# Patient Record
Sex: Male | Born: 1940
Health system: Southern US, Community
[De-identification: ages and names within clinical notes are randomized; demographics above are authoritative.]

## PROBLEM LIST (undated history)

## (undated) DIAGNOSIS — F0280 Dementia in other diseases classified elsewhere without behavioral disturbance: Secondary | ICD-10-CM

## (undated) DIAGNOSIS — R350 Frequency of micturition: Secondary | ICD-10-CM

## (undated) DIAGNOSIS — N390 Urinary tract infection, site not specified: Secondary | ICD-10-CM

## (undated) DIAGNOSIS — E7800 Pure hypercholesterolemia, unspecified: Secondary | ICD-10-CM

## (undated) DIAGNOSIS — C679 Malignant neoplasm of bladder, unspecified: Secondary | ICD-10-CM

## (undated) DIAGNOSIS — R3915 Urgency of urination: Secondary | ICD-10-CM

## (undated) DIAGNOSIS — Z85828 Personal history of other malignant neoplasm of skin: Secondary | ICD-10-CM

## (undated) DIAGNOSIS — Z87442 Personal history of urinary calculi: Secondary | ICD-10-CM

## (undated) DIAGNOSIS — D649 Anemia, unspecified: Secondary | ICD-10-CM

## (undated) DIAGNOSIS — F0390 Unspecified dementia without behavioral disturbance: Secondary | ICD-10-CM

## (undated) DIAGNOSIS — J986 Disorders of diaphragm: Secondary | ICD-10-CM

## (undated) DIAGNOSIS — Z8719 Personal history of other diseases of the digestive system: Secondary | ICD-10-CM

## (undated) DIAGNOSIS — G309 Alzheimer's disease, unspecified: Secondary | ICD-10-CM

## (undated) DIAGNOSIS — H9190 Unspecified hearing loss, unspecified ear: Secondary | ICD-10-CM

## (undated) DIAGNOSIS — N400 Enlarged prostate without lower urinary tract symptoms: Secondary | ICD-10-CM

## (undated) DIAGNOSIS — G839 Paralytic syndrome, unspecified: Secondary | ICD-10-CM

## (undated) DIAGNOSIS — C801 Malignant (primary) neoplasm, unspecified: Secondary | ICD-10-CM

## (undated) DIAGNOSIS — I10 Essential (primary) hypertension: Secondary | ICD-10-CM

## (undated) DIAGNOSIS — R209 Unspecified disturbances of skin sensation: Secondary | ICD-10-CM

## (undated) DIAGNOSIS — Z923 Personal history of irradiation: Secondary | ICD-10-CM

## (undated) DIAGNOSIS — N12 Tubulo-interstitial nephritis, not specified as acute or chronic: Secondary | ICD-10-CM

## (undated) DIAGNOSIS — R918 Other nonspecific abnormal finding of lung field: Secondary | ICD-10-CM

## (undated) DIAGNOSIS — N200 Calculus of kidney: Secondary | ICD-10-CM

## (undated) DIAGNOSIS — Z9221 Personal history of antineoplastic chemotherapy: Secondary | ICD-10-CM

## (undated) HISTORY — PX: SUBDURAL HEMORRHAGE DRAINAGE: SHX845

## (undated) HISTORY — PX: UPPER GI ENDOSCOPY: SHX6162

## (undated) HISTORY — PX: EYE SURGERY: SHX253

## (undated) HISTORY — PX: OTHER SURGICAL HISTORY: SHX169

## (undated) HISTORY — PX: CATARACT EXTRACTION, BILATERAL: SHX1313

## (undated) HISTORY — PX: COLONOSCOPY: SHX174

---

## 2003-03-21 ENCOUNTER — Ambulatory Visit (HOSPITAL_COMMUNITY): Admission: RE | Admit: 2003-03-21 | Discharge: 2003-03-21 | Payer: Self-pay | Admitting: Endocrinology

## 2003-03-21 ENCOUNTER — Encounter: Payer: Self-pay | Admitting: Endocrinology

## 2003-06-16 DIAGNOSIS — Z8719 Personal history of other diseases of the digestive system: Secondary | ICD-10-CM

## 2003-06-16 HISTORY — DX: Personal history of other diseases of the digestive system: Z87.19

## 2003-06-16 HISTORY — PX: EXTRACORPOREAL SHOCK WAVE LITHOTRIPSY: SHX1557

## 2003-10-31 ENCOUNTER — Encounter: Admission: RE | Admit: 2003-10-31 | Discharge: 2003-10-31 | Payer: Self-pay | Admitting: General Surgery

## 2003-12-03 ENCOUNTER — Ambulatory Visit (HOSPITAL_COMMUNITY): Admission: RE | Admit: 2003-12-03 | Discharge: 2003-12-03 | Payer: Self-pay | Admitting: Endocrinology

## 2003-12-12 ENCOUNTER — Inpatient Hospital Stay (HOSPITAL_COMMUNITY): Admission: EM | Admit: 2003-12-12 | Discharge: 2003-12-16 | Payer: Self-pay | Admitting: Emergency Medicine

## 2003-12-19 ENCOUNTER — Emergency Department (HOSPITAL_COMMUNITY): Admission: EM | Admit: 2003-12-19 | Discharge: 2003-12-19 | Payer: Self-pay

## 2004-02-05 ENCOUNTER — Encounter
Admission: RE | Admit: 2004-02-05 | Discharge: 2004-02-05 | Payer: Self-pay | Admitting: Thoracic Surgery (Cardiothoracic Vascular Surgery)

## 2004-02-14 ENCOUNTER — Ambulatory Visit (HOSPITAL_COMMUNITY): Admission: RE | Admit: 2004-02-14 | Discharge: 2004-02-14 | Payer: Self-pay | Admitting: Urology

## 2004-02-19 ENCOUNTER — Ambulatory Visit (HOSPITAL_BASED_OUTPATIENT_CLINIC_OR_DEPARTMENT_OTHER): Admission: RE | Admit: 2004-02-19 | Discharge: 2004-02-19 | Payer: Self-pay | Admitting: Urology

## 2006-03-09 ENCOUNTER — Inpatient Hospital Stay (HOSPITAL_COMMUNITY): Admission: RE | Admit: 2006-03-09 | Discharge: 2006-03-15 | Payer: Self-pay | Admitting: Endocrinology

## 2006-03-24 ENCOUNTER — Encounter (INDEPENDENT_AMBULATORY_CARE_PROVIDER_SITE_OTHER): Payer: Self-pay | Admitting: *Deleted

## 2006-03-24 ENCOUNTER — Ambulatory Visit (HOSPITAL_COMMUNITY): Admission: RE | Admit: 2006-03-24 | Discharge: 2006-03-25 | Payer: Self-pay | Admitting: Urology

## 2006-03-29 ENCOUNTER — Ambulatory Visit (HOSPITAL_COMMUNITY): Admission: RE | Admit: 2006-03-29 | Discharge: 2006-03-29 | Payer: Self-pay | Admitting: *Deleted

## 2006-04-28 ENCOUNTER — Encounter (INDEPENDENT_AMBULATORY_CARE_PROVIDER_SITE_OTHER): Payer: Self-pay | Admitting: *Deleted

## 2006-04-28 ENCOUNTER — Inpatient Hospital Stay (HOSPITAL_COMMUNITY): Admission: RE | Admit: 2006-04-28 | Discharge: 2006-04-30 | Payer: Self-pay | Admitting: Urology

## 2007-04-29 ENCOUNTER — Inpatient Hospital Stay (HOSPITAL_COMMUNITY): Admission: EM | Admit: 2007-04-29 | Discharge: 2007-05-03 | Payer: Self-pay | Admitting: Emergency Medicine

## 2007-04-29 HISTORY — PX: OTHER SURGICAL HISTORY: SHX169

## 2007-05-16 ENCOUNTER — Ambulatory Visit (HOSPITAL_BASED_OUTPATIENT_CLINIC_OR_DEPARTMENT_OTHER): Admission: RE | Admit: 2007-05-16 | Discharge: 2007-05-16 | Payer: Self-pay | Admitting: Urology

## 2007-05-16 HISTORY — PX: CYSTOSCOPY: SUR368

## 2008-10-09 HISTORY — PX: OTHER SURGICAL HISTORY: SHX169

## 2008-10-11 ENCOUNTER — Ambulatory Visit (HOSPITAL_COMMUNITY): Admission: RE | Admit: 2008-10-11 | Discharge: 2008-10-12 | Payer: Self-pay | Admitting: Urology

## 2008-10-22 ENCOUNTER — Encounter: Admission: RE | Admit: 2008-10-22 | Discharge: 2008-10-22 | Payer: Self-pay | Admitting: Urology

## 2008-10-23 ENCOUNTER — Ambulatory Visit (HOSPITAL_BASED_OUTPATIENT_CLINIC_OR_DEPARTMENT_OTHER): Admission: RE | Admit: 2008-10-23 | Discharge: 2008-10-23 | Payer: Self-pay | Admitting: Urology

## 2010-09-23 LAB — CBC
MCHC: 35.1 g/dL (ref 30.0–36.0)
Platelets: 265 10*3/uL (ref 150–400)
RBC: 4.56 MIL/uL (ref 4.22–5.81)
RDW: 12.6 % (ref 11.5–15.5)

## 2010-09-23 LAB — BASIC METABOLIC PANEL
CO2: 30 mEq/L (ref 19–32)
Calcium: 9 mg/dL (ref 8.4–10.5)
Chloride: 101 mEq/L (ref 96–112)
Creatinine, Ser: 0.92 mg/dL (ref 0.4–1.5)
GFR calc Af Amer: >60 mL/min (ref >60)
Glucose, Bld: 212 mg/dL — ABNORMAL HIGH (ref 70–99)
Potassium: 4.1 mEq/L (ref 3.5–5.1)
Sodium: 138 mEq/L (ref 135–145)

## 2010-09-23 LAB — URINALYSIS, ROUTINE W REFLEX MICROSCOPIC
Bilirubin Urine: NEGATIVE
Glucose, UA: NEGATIVE mg/dL
Specific Gravity, Urine: 1.013 (ref 1.005–1.030)
Urobilinogen, UA: 0.2 mg/dL (ref 0.0–1.0)

## 2010-09-23 LAB — GLUCOSE, CAPILLARY
Glucose-Capillary: 210 mg/dL — ABNORMAL HIGH (ref 70–99)
Glucose-Capillary: 269 mg/dL — ABNORMAL HIGH (ref 70–99)

## 2010-09-23 LAB — URINE MICROSCOPIC-ADD ON

## 2010-09-24 LAB — GLUCOSE, CAPILLARY
Glucose-Capillary: 138 mg/dL — ABNORMAL HIGH (ref 70–99)
Glucose-Capillary: 140 mg/dL — ABNORMAL HIGH (ref 70–99)
Glucose-Capillary: 150 mg/dL — ABNORMAL HIGH (ref 70–99)
Glucose-Capillary: 150 mg/dL — ABNORMAL HIGH (ref 70–99)
Glucose-Capillary: 152 mg/dL — ABNORMAL HIGH (ref 70–99)
Glucose-Capillary: 155 mg/dL — ABNORMAL HIGH (ref 70–99)

## 2010-09-24 LAB — BASIC METABOLIC PANEL
Calcium: 9.1 mg/dL (ref 8.4–10.5)
Creatinine, Ser: 1.2 mg/dL (ref 0.4–1.5)
GFR calc non Af Amer: >60 mL/min (ref >60)
Glucose, Bld: 162 mg/dL — ABNORMAL HIGH (ref 70–99)
Potassium: 3.4 mEq/L — ABNORMAL LOW (ref 3.5–5.1)

## 2010-09-24 LAB — CULTURE, BLOOD (ROUTINE X 2)

## 2010-09-24 LAB — CBC
Hemoglobin: 12.3 g/dL — ABNORMAL LOW (ref 13.0–17.0)
MCHC: 34.8 g/dL (ref 30.0–36.0)
MCHC: 35.7 g/dL (ref 30.0–36.0)
MCV: 86.4 fL (ref 78.0–100.0)
Platelets: 144 10*3/uL — ABNORMAL LOW (ref 150–400)
RDW: 12.7 % (ref 11.5–15.5)
RDW: 12.9 % (ref 11.5–15.5)

## 2010-09-24 LAB — DIFFERENTIAL
Basophils Absolute: 0 10*3/uL (ref 0.0–0.1)
Basophils Relative: 0 % (ref 0–1)
Basophils Relative: 0 % (ref 0–1)
Eosinophils Absolute: 0 10*3/uL (ref 0.0–0.7)
Lymphocytes Relative: 7 % — ABNORMAL LOW (ref 12–46)
Lymphs Abs: 0.7 10*3/uL (ref 0.7–4.0)
Monocytes Absolute: 1.5 10*3/uL — ABNORMAL HIGH (ref 0.1–1.0)
Monocytes Absolute: 1.7 10*3/uL — ABNORMAL HIGH (ref 0.1–1.0)
Neutro Abs: 8.9 10*3/uL — ABNORMAL HIGH (ref 1.7–7.7)
Neutrophils Relative %: 79 % — ABNORMAL HIGH (ref 43–77)
Neutrophils Relative %: 83 % — ABNORMAL HIGH (ref 43–77)

## 2010-09-24 LAB — URINE CULTURE

## 2010-10-28 NOTE — Op Note (Signed)
NAME:  [name] [name], [name] [name] NO.:  [id number]   MEDICAL RECORD NO.:  [id number]          PATIENT TYPE:  AMB   LOCATION:  [facility]                         FACILITY:  [facility]   PHYSICIAN:  [name], M.D.  DATE OF BIRTH:  Dec 07, 1940   DATE OF PROCEDURE:  10/23/2008  DATE OF DISCHARGE:                               OPERATIVE REPORT   PREOPERATIVE DIAGNOSES:  1. Right nephrolithiasis with a history of urosepsis.  2. Prostatic urethral adhesion.   OPERATIVE PROCEDURE:  Cystourethroscopy, lysis of prostatic urethral  adhesion, flexible ureterorenoscopy with holmium laser lithotripsy and  removal of fragments, removal and replacement of double-J stent.   SURGEON:  Lucrezia Starch. Earlene Plater, M.D.   ANESTHESIA:  LMA.   ESTIMATED BLOOD LOSS:  Negligible.   TUBES:  26-cm 8-French Contour double pigtail stent.   COMPLICATIONS:  None.   INDICATIONS FOR PROCEDURE:  Mr. Sipos is a very nice 70 year old white  male who originally presented with right flank pain.  He was to undergo  a lithotripsy and developed significant fever.  He subsequently  underwent cystoscopy and placement of a right double-J stent by Dr.  Laverle Patter on October 11, 2008, and he has defervesced and been treated with  antibiotics.  After understanding risks, benefits and alternatives, he  has elected to proceed with the above procedure.   PROCEDURE IN DETAIL:  The patient was placed in the supine position and  after proper LMA anesthesia, he was placed in the dorsal lithotomy  position and prepped and draped with Betadine in a sterile fashion.  Cystourethroscopy was performed with a 22.5-French Olympus panendoscope.  There was an adhesion noted in the prostatic urethra from the prior TURP  running from the 3 o'clock to the 9 o'clock position in the mid  prostatic urethra.  This was cauterized with a Bovie coagulation cautery  and lysed in its midline with meatotomy scissors, completely opening the  prostatic urethra.   Inspection of the bladder revealed it was somewhat  inflamed and there was significant mucus present, especially on the  stent.  The stent was pulled to the meatus.  A 0.038-French sensor wire  was placed into the right renal pelvis and the old stent was removed.  The ureter was then dilated with a ureteral access sheath which was  placed up to the ureteropelvic junction without obstruction.  The inner  dilating portion was removed and the flexible digital ureterorenoscope  was placed.  Inspection of the renal pelvis and the calices was  performed again.  There was significant mucus throughout the system.  The stone was visualized in the lower pole caliceal system.  It was  quite large and yellow, could not be seen well on KUB, and was  consistent with a uric acid stone.  Utilizing the 200-micron laser  fiber, the stone was fragmented into multiple small fragments and  several were removed.  It was felt that he should be able to pass the  fragments and that further manipulation was probably not indicated.  A  retrograde ureteropyelogram was then performed through the scope and no  extravasation  was noted to be present.  The flexible ureteroscope was  then visually removed after a 0.038-French sensor wire had been placed  through it into the pelvis.  The ureteral access sheath was removed and  under fluoroscopic guidance a 26-cm 8-French Contour double pigtail  stent was placed without a pullout string and noted to be in good  position within the right renal pelvis within the bladder.  The bladder  was drained.  The panendoscope was removed.  Again stent localization  was confirmed, and the patient was taken to the recovery room stable.  The removed fragments will be submitted for stone analysis, and he was  placed on potassium citrate along with antibiotics.      [name], M.D.  Electronically Signed     RLD/MEDQ  D:  10/23/2008  T:  10/23/2008  Job:  [id number]

## 2010-10-28 NOTE — Discharge Summary (Signed)
NAME:  [name] [name], [name] [name] NO.:  [id number]   MEDICAL RECORD NO.:  [id number]          PATIENT TYPE:  INP   LOCATION:  1535                         FACILITY:  [facility]   PHYSICIAN:  Lonia Blood, M.D.       DATE OF BIRTH:  07/07/1940   DATE OF ADMISSION:  04/29/2007  DATE OF DISCHARGE:  05/03/2007                               DISCHARGE SUMMARY   PRIMARY CARE PHYSICIAN:  Brooke Bonito, M.D.   DISCHARGE DIAGNOSIS:  1. Complicated pyelonephritis secondary to left ureteral obstructive      calculus status post ureteral stent placement.  2. Ileus, resolved.  3. Hypertension.  4. Diabetes mellitus, type 2.  5. Chronic nodular lung pattern.  6. Large calcified gallstone without cholecystitis.  7. One blood culture positive for coagulase-negative Staphylococcus,      most likely contaminant.   DISCHARGE MEDICATIONS:  1. Aceon 4 mg daily.  2. Actos 30 mg daily.  3. Glimepiride 2 mg daily.  4. Avodart 0.5 mg daily.  5. Hydrochlorothiazide 25 mg daily.  6. Ciprofloxacin 500 mg twice a day.  7. Diflucan 100 mg daily.   CONDITION ON DISCHARGE:  [name] was discharged in good condition.  He  was instructed follow up in a week with his primary urologist for the  stone extraction   CONSULTATION:  During this admission the patient was seen Dr. Laverle Patter  from urology.   PROCEDURES DURING THIS ADMISSION:  1. April 29, 2007, the patient underwent CT scan abdomen and pelvis      with findings of an obstructive left ureteral stone.  2. April 29, 2007, the patient underwent retrograde pyelography      with stent placement of left ureter by Dr. Laverle Patter.   HISTORY AND PHYSICAL:  For admission History and Physical, refer to  dictated H&P done by Dr. Waymon Amato on April 29, 2007.   HOSPITAL COURSE.:  #1.  ABDOMINAL PAIN:  [name] was admitted through the emergency room  on April 29, 2007, with abdominal pain.  He was placed empirically on  ciprofloxacin.  His CT scan of  the abdomen revealed the presence of a  left hydronephrosis with obstructive distal ureteral calculus.  Mr.  Gorniak was taken emergently to the operating room, and he had a stent  placed in his left ureter.  He was placed on empiric antibiotics.  The  cultures obtained from the left kidney grew Candida.  [name] was  treated with intravenous ciprofloxacin for Candida, and he is currently  afebrile and without symptoms.  He will follow up with urology after 1  week of antibiotics for extraction of the stone.   #2.  ONE POSITIVE BLOOD CULTURE WITH STAPHYLOCOCCUS:  Upon initial  presentation, [name] was febrile with a SIRS like picture.  As the  source of the SIRS was unclear, [name] had blood cultures obtained.  One of them grew gram-positive cocci.  [name] was started on  vancomycin until the final speciation was available on May 03, 2007.  The isolate is Staphylococcus coagulase-negative species which is  definitely  contaminant.  [name] will be taken off of vancomycin, and  he does not need further antibiotics for this positive blood culture.   #3.  DIABETES MELLITUS, TYPE 2.  While in the hospital, [name] was  treated with sliding scale insulin.  As he is much better now and is  able to eat a regular diet, [name] was advised to resume his Actos  and glimepiride treatments.      Lonia Blood, M.D.  Electronically Signed     SL/MEDQ  D:  05/03/2007  T:  05/03/2007  Job:  [id number]   cc:   Brooke Bonito, M.D.  Fax: [phone]   Heloise Purpura, MD  Fax: [phone]

## 2010-10-28 NOTE — Op Note (Signed)
NAME:  [name] [name], [name] [name] NO.:  [id number]   MEDICAL RECORD NO.:  [id number]          PATIENT TYPE:  INP   LOCATION:  0109                         [facility]ACILITY:  [facility]   PHYSICIAN:  Heloise Purpura, MD      DATE OF BIRTH:  04/27/1941   DATE OF PROCEDURE:  04/29/2007  DATE OF DISCHARGE:                               OPERATIVE REPORT   PREOPERATIVE DIAGNOSIS:  1. Left ureteral calculus.  2. Left ureteral obstruction with fever.   POSTOPERATIVE DIAGNOSIS:  1. Left ureteral calculus.  2. Left ureteral obstruction with fever.   PROCEDURE:  1. Cystoscopy.  2. Left retrograde pyelography.  3. Left ureteral stent placement (6 x 24).   SURGEON:  Heloise Purpura, M.D.   ANESTHESIA:  LMA.   COMPLICATIONS:  None.   INTRAOPERATIVE FINDINGS:  The patient was noted to have purulent  drainage from his left ureter consistent with an obstructed infection of  the left kidney.   SPECIMENS:  Culture from bladder and left renal pelvis.   INDICATION:  [name] [name] is a 70 year old gentleman who receives urologic  care by Dr. Gaynelle [name].  He came to the emergency room today with a  complaint of abdominal pain and fever.  A CT scan was performed which  demonstrated an obstructing distal 9 mm left ureteral calculus.  After  discussing risks, potential complications and alternative treatment  options, the patient agreed to proceed with the above procedures.  Informed consent was obtained.   DESCRIPTION OF PROCEDURE:  The patient was taken to the operating room  and anesthesia was induced.  He was given preoperative ciprofloxacin in  the emergency department and was then placed in the dorsal lithotomy  position.  He was prepped and draped in the usual sterile fashion.  A  preoperative time-out was performed.  Cystourethroscopy was then  performed with the 12 degrees lens.  The bladder was noted to be  somewhat cloudy due to his prior IV contrast administration.  There was  also noted to  be a significant amount of purulence within the bladder.  Urine culture was obtained from the bladder.  Attention then turned to  the left ureteral orifice.  Plain fluoroscopy demonstrated a  calcification in the vicinity of the left ureter.  A 0.038 sensor  guidewire was then advanced through a 6-French ureteral catheter.  There  was noted to be resistance at the level of the stone and the sensor wire  was not easily passed.  Therefore the sensor wire was removed and a  0.038 Glidewire was advanced through the ureteral catheter and was able  to bypass the stone and be placed up into the renal pelvis with relative  ease under fluoroscopic guidance.  The ureteral catheter was then  advanced over the wire and a culture from the renal pelvis was obtained.  The sensor guidewire was then replaced through the ureteral catheter  into the renal pelvis and a 6 x 24 double-J ureteral stent was advanced  over the wire using Seldinger technique.  The wire was removed with a  good curl noted in the  renal pelvis as well as in the bladder.  A  retrograde ureterography had been performed which demonstrated the  filling defect consistent with the stone.  A small amount of contrast  was also injected during the procedure to confirm placement into the  renal pelvis.  In addition, the patient was noted to have some  intrarenal contrast from his prior CT scan earlier that day.  The  patient's bladder was then drained with a 16-French Foley catheter and  the procedure was ended.  He tolerated the procedure well without  complications.  He was able to be extubated and transferred to the  recovery unit in satisfactory condition.      Heloise Purpura, MD  Electronically Signed     LB/MEDQ  D:  04/29/2007  T:  04/30/2007  Job:  [id number]   cc:   Windy Fast L. Earlene Plater, M.D.  Fax: [phone]

## 2010-10-28 NOTE — H&P (Signed)
NAME:  [name] [name], [name] [name] NO.:  [id number]   MEDICAL RECORD NO.:  [id number]          PATIENT TYPE:  EMS   LOCATION:  ED                           FACILITY:  [facility]   PHYSICIAN:  Marcellus Scott, MD     DATE OF BIRTH:  08/21/1940   DATE OF ADMISSION:  04/29/2007  DATE OF DISCHARGE:                              HISTORY & PHYSICAL   PRIMARY CARE PHYSICIAN:  Dr. Juleen China   CHIEF COMPLAINT:  Abdominal pain and distention, decreased oral intake,  constipation, decreased appetite and weight loss.   HISTORY OF PRESENT ILLNESS:  [name] [name] is a pleasant, 70 year old  Caucasian male patient with extensive past medical history as indicated  below.  He was in his usual state of health until 4 days ago when he  started noticing gradual onset of lower abdominal pain with associated  distention.  This has slowly but progressively gotten worse.  He saw his  PMD on November 12 when x-rays of his abdomen were done.  X-rays reveal  elevated left hemidiaphragm and some distended bowel loops but no air  fluid levels.  Patient received an enema and a suppository.  Following  this, patient had a BM with two pebble-like hard stools.  However,  patient has continued to have stiffness and abdominal distention with  left-sided lower quadrant abdominal pain which at its worst has been  9/10 and non radiating.  The patient has passed flatus.  He has had some  nausea but no vomiting.  He has decreased appetite and lost 7 pounds of  weight over the last 4-5 days.  Patient denies any fevers or rigors.  He  claims he often feels cold.   The patient saw his PMD today and he was sent from the PMD's office to  the emergency room for futher evaluation, management and admission.   One week ago while patient was carrying a heavy rail, hw fell on his  left side with the left side of his chest hitting stairs and the rail  falling onto his right side.  However, patient did not have any pain  anywhere after  this.   PAST MEDICAL HISTORY:  1. Hypertension.  2. Hyperlipidemia.  3. Type 2 diabetes.  4. Superficial bladder tumors.  5. Bladder stones.  6. Diverticulosis/diverticulitis.  7. Hearing aid.   PAST SURGICAL HISTORY:  1. Subdural hematoma status post burr hole.  2. Bladder surgery.  3. Vasectomy.  4. Tonsillectomy.  5. Abscess surgery.   ALLERGIES:  DOXYCYCLINE - CAUSES HIVES.   MEDICATIONS:  1. Aceon 4 mg p.o. daily.  2. Actos 30 mg p.o. daily.  3. Glimepiride 2 mg p.o. daily.  4. Hydrochlorothiazide 25 mg p.o. daily.  5. Avodart 0.5 mg p.o. daily.  6. Several vitamins.  7. Metamucil.   FAMILY HISTORY:  Mother died at age 65 of diabetic and heart disease  complications.  A brother died at age 42 from metastatic cancer - colon,  liver, pancreas.   SOCIAL HISTORY:  The patient is married.  He is a retired Research scientist (life sciences).  There is no history  of smoking, alcohol or drug abuse.   REVIEW OF SYSTEMS:  Fourteen systems reviewed and in addition to history  of present illness, pertinent for some dyspnea. There is no chest pain,  coughing or palpitations. Patient has problems with chronic  constipation.   PHYSICAL EXAMINATION:  GENERAL:  [name] [name] is a moderately built and  nourished male patient who is quite comfortable, in no obvious distress.  VITAL SIGNS:  Temperature 101.5 degrees Fahrenheit, blood pressure  126/59 mmHg, pulse 83 per minute, regular, respirations 16 per minute,  saturating at 95% on room air.  HEENT:  Atraumatic, normocephalic.  Pupils are equal, reactive to light  and accommodation.  Oral cavity with no oropharyngeal erythema or  thrush.  NECK:  Without JVD, carotid bruit, lymphadenopathy or goiter.  Supple.  LYMPHATICS:  No lymphadenopathy.  RESPIRATORY SYSTEM:  Slightly decreased breath sounds in the left base  but no crackles, rhonchi or wheezing. The rest of the lung fields are  clear.  CARDIOVASCULAR SYSTEM:  First and second  heart sounds heard.  No third  or fourth heart sound, no murmurs.  ABDOMEN:  Distended, mainly upper abdomen.  There is mild tenderness in  the left lower quadrant and left middle quadrant. There is no guarding,  rigidity or rebound tenderness.  There is no organomegaly or mass  appreciated. Bowel sounds are increased.  CENTRAL NERVOUS SYSTEM:  Patient is awake, alert and oriented x3.  No  focal neurological deficits.  EXTREMITIES:  With no clubbing, cyanosis or edema. Peripheral pulses  symmetrically felt.  MUSCULOSKELETAL SYSTEM:  With no external evidence of injuries.   LAB DATA: CBC: Hb 14.1, hct 40.5, WBC 16.1, Plat 224. BMET significant  for sodium 134, BUN 19, Creat 1.46, glucose 243, lipase 16   ASSESSMENT AND PLAN:  1. Abdominal pain, distention and constipation - rule out intestinal      obstruction. Will admit patient to the medical floor. Will make      N.P.O. except sips, ice chips and medications.  Will followup CT of      the abdomen and pelvis.  Patient has had oral contrast and is      awaiting the CT to be done shortly.  Will place patient on IV      fluids, IV antiemetics and pain medications.  2. SIRS - rule out an acute abdominal process such as diverticulitis      but rule out urinary tract infection or a pneumonia.  Will send      blood cultures. Will followup urinalysis and will follow CT of the      abdomen.  Will request chest xRay. Will place patient empirically      on IV Cipro and Flagyl.  3. Prerenal azotemia secondary to poor oral intake, diuretics and ACE      inhibitors. Will hold diuretics. Will hydrate with IV fluids and      follow basic metabolic panel.  4. Type 2 diabetes: for A1C check and to place patient on sliding      scale insulin.  5. Leukocytosis.  6. Hypertension - continue patient's Aceon.   Addendum: CT Abdomen with moderate left hydronephrosis with obstructing  9mm distal ureteric renal calculi. Urologist Dr. Laverle Patter  consulted.      Marcellus Scott, MD  Electronically Signed     AH/MEDQ  D:  04/29/2007  T:  04/30/2007  Job:  [id number]   cc:   Brooke Bonito, M.D.  Fax: [phone]   Konrad Dolores  Laverle Patter, MD  Fax: [phone]

## 2010-10-28 NOTE — Consult Note (Signed)
NAME:  [name] [name], [name] [name] NO.:  [id number]   MEDICAL RECORD NO.:  [id number]          PATIENT TYPE:  INP   LOCATION:  0109                         [facility]ACILITY:  [facility]   PHYSICIAN:  Heloise Purpura, MD      DATE OF BIRTH:  [birth year]   DATE OF CONSULTATION:  04/29/2007  DATE OF DISCHARGE:                                 CONSULTATION   REASON FOR CONSULTATION:  Left ureteral calculus.   REQUESTING PHYSICIAN:  Dr. Waymon Amato   HISTORY:  [name] [name] is a 70 year old gentleman who has been under the  urologic care of Dr. Gaynelle Arabian.  He has a history of superficial  bladder cancer, benign prostatic hyperplasia, and nephrolithiasis.  He  recently underwent surveillance cystoscopy which was apparently negative  for tumor recurrence.  Three days ago, he began having the acute onset  of left-sided abdominal pain which was moderate to severe.  It was  associated with nausea and vomiting.  Today he did have some  intermittent fever and saw his primary care physician on Wednesday.  He  had been having problems with constipation as well as obstipation and  was administered an enema on Wednesday by his primary care physician.  His pain continued, and he presented to the emergency department today  where was found to have a fever to 101.5 degrees Fahrenheit.  He was  evaluated by the [facility] team and a CT scan was performed which  demonstrated a 9-mm distal left ureteral calculus with associated  hydronephrosis consistent with obstruction.   PAST MEDICAL HISTORY:  1. Diabetes.  2. Hypertension.  3. Bladder cancer.  4. Benign prostatic hyperplasia.  5. Nephrolithiasis.   PAST SURGICAL HISTORY:  1. ESWL.  2. Ureteroscopic stone removal.  3. TURBT.  4. Surgery for subdural hematoma.   MEDICATIONS:  1. Aceon.  2. Pravastatin.  3. Avodart.  4. Hydrochlorothiazide.  5. Glimepiride.  6. Actos.   ALLERGIES:  TETRACYCLINE, which causes hives.   FAMILY HISTORY:  There is a  maternal history of diabetes.  No history of  GU malignancy.   SOCIAL HISTORY:  The patient denies tobacco or alcohol use.   REVIEW OF SYSTEMS:  A complete review of systems was performed.  Pertinent positives are as stated in the history and particularly  include recent fever, nausea and vomiting.   PHYSICAL EXAMINATION:  VITAL SIGNS:  Temperature 101.5 which  subsequently defervesced to 98.3.  Blood pressure is currently 146/82,  respirations 16, heart rate 84.  CONSTITUTIONAL:  Well-nourished, well-developed, age-appropriate male in  no acute distress.  HEENT:  Normocephalic, atraumatic.  NECK:  No JVD or neck masses.  CARDIOVASCULAR:  Regular rate and rhythm without obvious murmurs.  LUNGS:  Clear bilaterally.  ABDOMEN:  The patient has moderate tenderness on palpation of his left  upper quadrant.  There is no rebound or guarding.  No right-sided  abdominal tenderness.  BACK:  No CVA tenderness.  EXTREMITIES:  No edema.  GENITOURINARY:  Normal male genitalia.  NEUROLOGIC:  Grossly intact.  PSYCHIATRIC:  Normal mood and affect.   LABORATORY DATA:  A  CBC demonstrates a white blood count was 16,100,  hemoglobin 14.1.  Serum creatinine is slightly elevated from his  baseline and is currently 1.46 with an estimated glomerular filtration  rate of 49 mL per minute.  Urinalysis and urine culture are currently  pending.   RADIOLOGY/IMAGING:  The patient's CT scan was independently reviewed and  does demonstrate a 9-mm distal left ureteral calculus with associated  hydronephrosis.   IMPRESSION:  Abdominal pain likely secondary to obstructing left  ureteral calculus.   PLAN:  Based on the fact that the patient does have leukocytosis and has  been febrile, I have recommended proceeding with urgent renal drainage.  The patient will therefore be placed on IV ciprofloxacin and will  undergo cystoscopy with left ureteral stent placement.  He has been  admitted to the [facility] for further evaluation of his  GI complaints.  [name] [name] has been informed that he likely will require  a two-week course of antibiotics and then will need followup with Dr.  Earlene Plater for further definitive management of his calculus.      Heloise Purpura, MD  Electronically Signed     LB/MEDQ  D:  04/29/2007  T:  04/30/2007  Job:  [id number]   cc:   Windy Fast L. Earlene Plater, M.D.  Fax: [phone]   Marcellus Scott, MD

## 2010-10-28 NOTE — Op Note (Signed)
NAME:  [name] [name], HEMANN NO.:  [id number]   MEDICAL RECORD NO.:  [id number]          PATIENT TYPE:  AMB   LOCATION:  DAY                          FACILITY:  [facility]   PHYSICIAN:  Heloise Purpura, MD      DATE OF BIRTH:  1941/02/12   DATE OF PROCEDURE:  10/11/2008  DATE OF DISCHARGE:                               OPERATIVE REPORT   PREOPERATIVE DIAGNOSES:  1. Right ureteral calculus.  2. Fever.   POSTOPERATIVE DIAGNOSES:  1. Right ureteral calculus.  2. Fever.   PROCEDURES:  1. Cystoscopy.  2. Right ureteral stent placement (6 x 26).   SURGEON:  Dr. Heloise Purpura.   ANESTHESIA:  General.   COMPLICATIONS:  None.   INDICATIONS:  Mr. Pink is a 70 year old gentleman who presented with  right-sided flank pain 2 days ago and was found have a 1.4-cm UPJ  calculus.  He was placed on empiric antibiotic therapy with Levaquin at  that time by Dr. Earlene Plater, and a urine culture was sent which subsequently  returned negative.  He was scheduled to undergo shockwave lithotripsy  therapy.  When he presented for shockwave lithotripsy today, he was  found to be febrile with a temperature 101.6 Fahrenheit.  Therefore, it  was decided to cancel his lithotripsy and proceed with ureteral stent  placement for renal drainage.  The potential risks, complications, and  alternative treatment options were discussed in detail, and informed  consent was obtained.   DESCRIPTION OF PROCEDURE:  The patient was taken to the operating room,  and general anesthetic was administered.  He was given preoperative  antibiotics, placed in the dorsal lithotomy position, and prepped and  draped in the usual sterile fashion.  Next, preoperative time out was  performed.  Cystourethroscopy was then performed which demonstrated an  unremarkable urethra and bladder.  The ureteral orifice on the right  side was identified and intubated with a 6-French ureteral catheter.  A  0.038 sensor guidewire was then  advanced up the right ureter and into  the right renal pelvis past the stone without difficulty.  The 6-French  ureteral catheter was then advanced over the wire into the renal pelvis,  and a culture from the renal pelvis was obtained.  There was noted to be  cloudy urine consistent with a probable infection.  This was sent for a  urinalysis as well as culture and sensitivity.  The wire was then  replaced into the renal pelvis, and a 6 x 26 double-J ureteral stent was  advanced over the wire  using Seldinger technique.  It was appropriately positioned under  fluoroscopic and cystoscopic guidance.  The wire was then removed with a  good curl noted in the renal pelvis as well as in the bladder.  He  tolerated the procedure well and without complications.  He was able to  be transferred to the recovery unit in satisfactory condition.      Heloise Purpura, MD  Electronically Signed     LB/MEDQ  D:  10/11/2008  T:  10/11/2008  Job:  [id number]

## 2010-10-28 NOTE — Op Note (Signed)
NAME:  [name] [name], [name] [name] NO.:  [id number]   MEDICAL RECORD NO.:  [id number]          PATIENT TYPE:  AMB   LOCATION:  NESC                         FACILITY:  [facility]   PHYSICIAN:  [name], M.D.  DATE OF BIRTH:  1941-01-04   DATE OF PROCEDURE:  05/16/2007  DATE OF DISCHARGE:                               OPERATIVE REPORT   PREOPERATIVE DIAGNOSIS:  Left ureterolithiasis status post urosepsis and  placement of left double-J stent.   OPERATIVE PROCEDURE:  Cystourethroscopy, left ureteroscopy with basket  stone extraction, removal of double-J stent and replacement of left  double-J stent.   SURGEON:  Lucrezia Starch. Earlene Plater, M.D.   ANESTHESIA:  LMA.   ESTIMATED BLOOD LOSS:  Negligible.   TUBES:  24-cm 6-French Contour double pigtail stent   COMPLICATIONS:  None.   INDICATIONS FOR PROCEDURE:  Mr. Cawley is a very nice 70 year old white  male who presented with left flank pain, nausea, vomiting, and fever.  He was subsequently admitted to the hospital and underwent placement of  a left double-J stent by Dr. Heloise Purpura on April 29, 2007.  He  subsequently defervesced, feeling fine.  He had a repeat CT scan.  The  stone was difficult to see.  After understanding the risks, benefits,  and alternatives, he elected to proceed with the above procedure.   PROCEDURE IN DETAIL:  The patient was placed in the supine position.  After appropriate LMA anesthesia, he was placed in the dorsal lithotomy  position and prepped and draped with Betadine in a sterile fashion.   Cystourethroscopy was performed with a 22.5-French Olympus panendoscope.  The prostatic fossa appeared to be widely resected.  The bladder was  smooth-walled, and  efflux of clear urine was noted from the right  ureteral orifice.  The stent was protruding from the left ureteral  orifice, and it was beginning to have some encrustation.  The end was  pulled out to the meatus of the stent with the grasping  forceps and a  0.038-French sensor wire was placed into the left renal pelvis, and the  stent was removed.  Ureteroscopy was then performed with a short thin  semi-flexible ureteroscope, and at the junction of the middle third and  the lower third ureter, the stone was seen to be impacted into the wall.  It was grasped with a nitinol basket and removed in its entirety.  Reinspection of the lower two-thirds ureter revealed there were no  fragments, although there was some inflammation noted, and there were no  perforations.  The ureteroscope was removed, and under fluoroscopic  guidance, a new 24-cm 6-French Contour double pigtail stent was placed  and noted to be in good position within the left renal pelvis within the  bladder.   The patient tolerated the procedure well and was taken to the recovery  room stable.  The stone will be submitted for stone analysis.      [name], M.D.  Electronically Signed     RLD/MEDQ  D:  05/16/2007  T:  05/16/2007  Job:  [id number]

## 2010-10-31 NOTE — Op Note (Signed)
NAME:  [name] [name], [name] [name] NO.:  [id number]   MEDICAL RECORD NO.:  [id number]                   PATIENT TYPE:  AMB   LOCATION:  NESC                                 FACILITY:  [facility]   PHYSICIAN:  Maretta Bees. Vonita Moss, M.D.             DATE OF BIRTH:  [birth year]   DATE OF PROCEDURE:  02/19/2004  DATE OF DISCHARGE:                                 OPERATIVE REPORT   PREOPERATIVE DIAGNOSES:  Bladder stone and left hydronephrosis with distal  left ureteral calculi following ESL; all associated with severe nausea and  vomiting, flank pain.   POSTOPERATIVE DIAGNOSES:  Bladder stone and left hydronephrosis with distal  left ureteral calculi following ESL; all associated with severe nausea and  vomiting, flank pain.   PROCEDURE:  Cystoscopy, electrohydraulic lithotripsy of bladder stones, left  ureteroscopic stone fragmentation with Holmium laser and stone basketing,  left retrograde pyelogram interpretation, insertion left double-J catheter.   SURGEON:  Maretta Bees. Vonita Moss, M.D.   ANESTHESIA:  General.   INDICATIONS FOR PROCEDURE:  This 70 year old white male underwent recent ESL  of a large left renal calculus.  He also has known bladder calculus. We  discussed about a double-J catheter but he elected to put that off,  depending upon his clinical course, and if he needed, to do it at the same  time as his bladder stones are removed, or remove the bladder stones  independently later.   In the meantime, he developed allot of severe nausea and vomiting, flank  pain over the weekend and a KUB showed what appeared to be fragmented stone  in the distal left ureter and a sizeable collection in the form of a  Steinstrasse.  Because of the severe pain and discomfort, nausea and  vomiting, and the fact that he would need a procedure to get his bladder  stones removed; we elected today to proceed with therapy of the calculi in  the bladder and left ureter.   DESCRIPTION  OF PROCEDURE:  The patient was brought to the operating room,  placed in the lithotomy position. External genitalia were prepped and draped  in the usual fashion.  He was cystoscope and the bladder had some small  stones in there that were probably from the renal calculus. There was 1  large, jacked stone that was fragmented with the electrohydraulic  lithotriptor into small enough fragments to be removed through the  cystoscope.  He also had trilobar hypertrophy with a median lobe in the  prostate. Fortunately, the median lobe did not prevent me from visualizing  the ureteral orifices after removal of the bladder stone.   A guide wire was placed up the distal left ureter, but I needed a Glidewire  to bypass these distal stone fragments. Then the intramural ureter was  dilated for 3 minutes with the ureteral dilating balloon which was 4 cm in  length and distended with 10 atmospheres of  pressure. I then inserted 6  French ureteroscope and encountered multiple small stones, although a couple  were large enough that I did not believe would pass spontaneously.  Therefore, I used the Holmium laser to fragment the larger stone fragments  into small, passable sized stones.  I retrieved a few of the stones with a  stone basket, but he did have what I believe is a J-hooking in the distal  ureter, and it was a little difficult maneuvering the stones around the  corner, and I did not want to over-manipulate this ureter, because at this  time, it was intact with no injury.  I felt like the rest of the stone  fragments would pass with a double-J catheter in place, especially if there  were smaller stone fragments still up in the kidney.   At this point, retrograde pyelogram confirmed the intact status of the  ureter and pyelocalyceal system.  Then a 6 [place], 26 cm double-J catheter  was placed with a full coil in the renal pelvis and a full coil in the  bladder, and the string brought out per  urethra.   He will go home in a couple of days with Levaquin and use a continued  Uroxatral for his BPH and voiding symptoms.  He will use Urised for bladder  discomfort.  I will see him in 2 days in follow-up for KUB and assess the  proper timing for removal of his double-J catheter.                                               Maretta Bees. Vonita Moss, M.D.    LJP/MEDQ  D:  02/19/2004  T:  02/19/2004  Job:  [id number]

## 2010-10-31 NOTE — Op Note (Signed)
NAME:  [name] [name], [name] [name] NO.:  [id number]   MEDICAL RECORD NO.:  [id number]          PATIENT TYPE:  INP   LOCATION:  0007                         FACILITY:  [facility]   PHYSICIAN:  Lucrezia Starch. Earlene Plater, M.D.  DATE OF BIRTH:  [birth year]   DATE OF PROCEDURE:  04/28/2006  DATE OF DISCHARGE:                                 OPERATIVE REPORT   DIAGNOSES:  1. Benign prostatic hypertrophy.  2. Urinary retention.  3. History of recent bladder tumor and bladder stones.   OPERATIVE PROCEDURE:  1. Cysto.  2. TURP.   SURGEON:  Gaynelle Arabian, M.D.   ANESTHESIA:  General endotracheal.   ESTIMATED BLOOD LOSS:  150 mL.   TUBES:  26 French 30 mL balloon Foley.   COMPLICATIONS:  None.   INDICATIONS FOR PROCEDURE:  [name] [name] is a very nice 70 year old white male,  who presented initially with some blood in his urine.  He was found to have  bladder stones and a transitional cell carcinoma of the bladder.  He  underwent resection approximately a month ago.  It was a fairly large tumor  that was eventually determined to be low-grade transitional cell carcinoma  with no invasion.  He subsequently has developed urinary retention and is  Foley dependent, we felt due to the BPH.  After understanding risks.  benefits, and alternatives, he has elected to proceed with cysto, TURP.   PROCEDURE IN DETAIL:  The patient was placed in a supine position.  After  proper general endotracheal anesthesia, placed in the dorsal lithotomy  position and prepped and draped with Betadine in a sterile fashion.  The  urethra was calibrated to 30 Jamaica with [employer], and a 28 Jamaica  Storch resectoscope sheath was placed over a Timberlake obturator.  Inspection of the bladder revealed that the area of resection of the bladder  tumor was healing well with some bullous edema, but no other lesions were  noted, and the trigone was intact.  The prostate was quite obstructive.  The  resection was begun in  the midline, and a groove was carved from the bladder  neck to the verumontanum down to the surgical capsule, maintaining  hemostasis.  The left lobe of the prostate was serially dissected as was the  right lobe, again to the surgical capsule.  Apical tissue was then  approached and resected 360 degrees.  An overhanging curtain of tissue in  the 12 o'clock position was serially resected to the surgical capsule.  Reinspection revealed there was no significant adenoma.  No perforations  were noted.  Good hemostasis was noted to be present, and all chips were  evacuated with a Ellik syringe, submitted to pathology.  Reinspection of the  bladder revealed there were no significant chips within it.  The  resectoscope sheath was visually removed, and a 26 French  30 mL balloon continuous-flow Foley was placed.  A 3-way Foley was placed in  the bladder, and the bladder was noted to irrigate clear.  The patient  tolerated the procedure well, no complications.  Chips were submitted to  pathology.  [name], M.D.  Electronically Signed     RLD/MEDQ  D:  04/28/2006  T:  04/28/2006  Job:  [id number]

## 2010-10-31 NOTE — Consult Note (Signed)
NAME:  [name] [name], TRACZ NO.:  [id number]   MEDICAL RECORD NO.:  [id number]          PATIENT TYPE:  INP   LOCATION:  0102                         FACILITY:  [facility]   PHYSICIAN:  Alfonse Ras, MD   DATE OF BIRTH:  1940-12-28   DATE OF CONSULTATION:  DATE OF DISCHARGE:                                   CONSULTATION   HISTORY OF PRESENT ILLNESS:  The patient is a 70 year old male who came in  complaining of abdominal pain to the emergency room after being seen by Dr.  Juleen China.  The patient was apparently seen in the office and has a history that  started about 4 days ago.  The pain was acute and suprapubic and extended to  the right lower quadrant.  He has been having cramping and constipation,  however he has had bowel movements the last 2 days.  He was sent to the  emergency room at [facility] for CT scan.  That showed probable  Phlegmon in the right lower quadrant, as well as what appears to be a  bladder tumor.  The appendix itself could not be visualized.  At the time of  my examination, the patient has received IV pain medication and is still  having minimal discomfort but his pain is markedly improved.  His white  count in the ER was 16.9.   PAST MEDICAL HISTORY:  Significant for diabetes, hypertension, and  urolithiasis.  He denies fever or chills.   PHYSICAL EXAMINATION:  He is an appropriate white male in no distress.  His  temperature is 99.2.  HEENT:  Normocephalic and atraumatic.  Pupils are equal, round and reactive  to light.  NECK:  Supple and soft, without thyromegaly.  LUNGS:  Clear to auscultation and percussion and with equal breath sounds  bilaterally.  HEART:  Regular rate and rhythm without murmurs, rubs or gallops.  ABDOMEN:  Soft with some mild lower quadrant tenderness and a few bowel  sounds.  RECTAL EXAM:  Not performed.   IMPRESSION:  Right lower quadrant Phlegmon.  After a discussion with Dr.  Kearney Hard, it is unclear whether  or not this involves the bladder tumor versus a  possible localized perforation of a cecal diverticulitis versus a  questionable perforated appendicitis.  He also is known to have fairly large  gallstones.  He has a 2 cm mass on the posterior bladder wall on CT scan,  concerning for bladder transitional cell carcinoma.   PLAN:  Admission, IV antibiotics, cystoscopy and exploratory laparoscopy  versus exploratory laparotomy.      Alfonse Ras, MD  Electronically Signed     KRE/MEDQ  D:  03/09/2006  T:  03/09/2006  Job:  [id number]   cc:   Elliot Cousin, M.D.

## 2010-10-31 NOTE — Op Note (Signed)
NAME:  DIALLO, PONDER NO.:  [id number]   MEDICAL RECORD NO.:  [id number]          PATIENT TYPE:  OIB   LOCATION:  1428                         FACILITY:  [facility]   PHYSICIAN:  [name], M.D.  DATE OF BIRTH:  03/13/1941   DATE OF PROCEDURE:  03/24/2006  DATE OF DISCHARGE:  03/25/2006                                 OPERATIVE REPORT   PREOPERATIVE DIAGNOSES:  1. Bladder mass.  2. Bladder stone.  3. Benign prostatic hypertrophy.   POSTOPERATIVE DIAGNOSES:  1. Bladder mass.  2. Bladder stone.  3. Benign prostatic hypertrophy.   OPERATION PERFORMED:  1. Cystoscopy.  2. Transurethral resection of bladder tumor - 2 centimeters.  3. Removal of stone.   SURGEON:  Lucrezia Starch. Earlene Plater, M.D.   ASSISTANT:  Terie Purser, M.D.   ANESTHESIA:  General endotracheal.   COMPLICATIONS:  None.   DRAINS:  Twenty French Foley catheter to straight drain.   SPECIMENS:  1. Bladder stone.  2. Left bladder tumor for pathologic analysis.  3. Right bladder tumor for pathologic analysis.   DISPOSITION:  The patient was stable and extubated, and taken to the post-  anesthesia care unit.   INDICATIONS FOR THE PROCEDURE:  Mr. Gallo is a 70 year old gentleman who has  a history of kidney stones.  He also has a history of urinary retention  requiring a Foley catheter and a history of benign prostatic hypertrophy.  He recently underwent a CT scan for follow up of diverticular disease, which  revealed evidence of a bladder mass in the right posterior wall of the  bladder.  He was evaluated by Dr. Earlene Plater and the decision was made to proceed  to the operating room for removal of bladder stones and assessment of his  bladder tumor as well as prostate.   Benefits and risks of the procedure were explained and consent was obtained.   DESCRIPTION OF THE OPERATION:  The patient was brought to the operating room  and was properly identified.  Time out was performed to confirm the correct  patient and procedure.  He was administered general anesthesia, given  preoperative antibiotics and placed in the dorsal lithotomy position, and  was prepped and draped in a sterile fashion.   Cystoscopy was performed with a 22 French cystoscope sheath with 12 and 70-  degree lens.  The anterior urethra was normal.  The posterior urethra  demonstrated trilobar hypertrophy.  Upon entering the bladder there was a  large median lobe.  Both ureteral orifices were identified.  Indigo carmine  had been given and these orifices were effluxing blue urine.  Systematic  examination of the bladder revealed evidence of two bladder tumor.  The  first bladder tumor involved the right lateral wall of the bladder just  above the ureteral orifice.  This was approximately 2 cm in size.  We also  noted another bladder tumor over the left lateral wall just above the  ureteral orifice.  There were no other bladder tumors noted.   We then removed the cystoscope and placed the resectoscope.  Cold cup  biopsies were  taken of the left tumor.  These were sent for analysis.  We  then placed the loop.  The left bladder tumor base was carefully fulgurated.  Care was taken not to injure the ureteral orifice.  Blue dye was noted  effluxng after fulguration.  We then turned our attention to the right  bladder tumor.  This was resected down to the muscle layer. This was  carefully fulgurated with excellent hemostasis.  The dye was noted emanating  from the right ureteral orifice.  The bladder chips were removed.  The  bladder was examined under low flow with excellent hemostasis.   We then removed the rectoscope and placed a Foley catheter.   The patient was awoken from anesthesia and transported to the recovery room  in stable condition.   COMPLICATIONS:  There were no complications.   Of note, Dr. Earlene Plater was present and participated in all aspects of the  procedure.     ______________________________  Terie Purser, MD      Lucrezia Starch. Earlene Plater, M.D.  Electronically Signed    JH/MEDQ  D:  03/24/2006  T:  03/26/2006  Job:  [id number]
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
## 2010-10-31 NOTE — Discharge Summary (Signed)
NAME:  GARIN, MATA NO.:  [id number]   MEDICAL RECORD NO.:  [id number]          PATIENT TYPE:  INP   LOCATION:  3038                         FACILITY:  [facility]   PHYSICIAN:  Donalee Citrin, M.D.        DATE OF BIRTH:  1940-12-24   DATE OF ADMISSION:  12/12/2003  DATE OF DISCHARGE:  12/16/2003                                 DISCHARGE SUMMARY   ADMISSION DIAGNOSIS:  Subacute subdural hematoma.   DISCHARGE DIAGNOSIS:  Subacute subdural hematoma.   HOSPITAL COURSE:  The patient is a very pleasant 70 year old gentleman who  was admitted through the emergency room with subacute and chronic subdural  hematoma with 2 cm of midline shift.  The patient was urgently taken to the  operating room and underwent evacuation of the chronic subdural with bur  holes.  Postoperatively the patient did very well in the recovery unit and  then went to intensive care unit with Jackson-Pratt drain in place.  The  patient was kept supine.  The drain put minimal output of approximately 15  to 16 mL overnight.  Follow-up head CT in the morning showed virtually  complete resolution of the clot.  The Jackson-Pratt drain then was removed.  Patient was progressively mobilized, did very well, was awake, ambulating  and voiding spontaneously.  Follow-up CT showed complete resolution of the  hemorrhage.  The patient was able to be discharged home on hospital day 5 in  stable condition.       GC/MEDQ  D:  03/07/2004  T:  03/08/2004  Job:  [id number]

## 2010-10-31 NOTE — Consult Note (Signed)
NAME:  [name], [name] NO.:  [id number]   MEDICAL RECORD NO.:  [id number]                   PATIENT TYPE:  EMS   LOCATION:  MAJO                                 FACILITY:  [facility]   PHYSICIAN:  Marlan Palau, M.D.               DATE OF BIRTH:  April 25, 1941   DATE OF CONSULTATION:  12/19/2003  DATE OF DISCHARGE:  12/19/2003                                   CONSULTATION   HISTORY OF PRESENT ILLNESS:  [name] [name] is a 70 year old, right-handed  white male born [birth year], with a history of a recent subdural  hematoma drained by Dr. Wynetta Emery within the last week or so.  The patient  presented with tingling sensations involving the right arm and hand.  He  underwent a scan and was found to have subdural.  This patient was sent for  neurosurgical evaluation and has done relatively well since drainage of the  subdural.  The patient has had a couple of episodes of right hand numbness  since that time.  He presented today with a similar episode of right hand  numbness associated with speech problems lasting less than a minute.  The  patient denied any jerking or twitching of the extremities or gait  disturbance.  Feels there was some change in vision, but it was not clear  that he ever lost vision to one side or the other.  The patient has  normalized at this point and comes into the emergency room for evaluation.  A CT scan of the brain was done which shows good resolution of the subdural  hematoma.  No cerebral infarction is noted.   PAST MEDICAL HISTORY:  1. History of recent subdural hematoma, spontaneous onset, etiology not     clear.  2. Diabetes.  3. Obesity.  4. Hypertension.  5. Status post tonsillectomy.  6. Status post vasectomy in the past.   MEDICATIONS:  1. Actos 30 mg a day.  2. Amaryl 4 mg a day.  3. Aceon 4 mg a day.  4. Hydrochlorothiazide 25 mg a day.  5. __________.   HABITS:  The patient does not smoke or drink.   ALLERGIES:  No known allergies.   SOCIAL HISTORY:  This patient is married, lives in the [place], has two  sons who are alive and well.   FAMILY MEDICAL HISTORY:  Notable that mother died with diabetes and heart  disease.  Father died of old age/skin cancers.  The patient has three  brothers and one sister.  One brother died with colon and liver cancer.   REVIEW OF SYSTEMS:  Notable for no recent fevers or chills.  The patient  denies any headache, denies any problems swallowing, neck pain, shortness of  breath, chest pain, abdominal pain, nausea, vomiting.  Denies any balance  problems or trouble controlling the bowels or the bladder, or seizure  episodes.   PHYSICAL  EXAMINATION:  VITAL SIGNS:  Blood pressure 135/85, heart rate 86,  respiratory rate 24, temperature afebrile.  GENERAL:  This patient is a moderately obese white male who is alert and  cooperative at the time of examination.  HEENT:  Head is atraumatic.  Eyes: Pupils equal, round, and reactive to  light.  Disks are flat bilaterally.  NECK:  Supple.  No carotid bruits noted.  RESPIRATORY:  Examination is clear.  CARDIOVASCULAR:  Examination reveals a regular rate and rhythm with no  obvious murmurs or rubs noted.  EXTREMITIES:  Without significant edema.  NEUROLOGIC:  Cranial nerves as above.  Facial symmetry is noted.  The  patient has again good sensation of the face to pinprick and soft touch  bilaterally.  He has good strength to facial muscles and the muscles to head  turning and shoulder shrug bilaterally.  Visual fields are full.  Speech is  well enunciated at this time.  Strength in all fours is symmetric and  normal.  The patient has good pinprick, soft touch, and vibratory sensation  in all fours.  Finger-nose-finger and toe-to-finger is normal.  The patient  was not ambulated.  Deep tendon reflexes remained symmetric and normal.   LABORATORY AND X-RAY DATA:  CT of the head is as above.   No other blood  work is available at this time.   IMPRESSION:  1. Transient episode of right hand numbness and speech disturbance.  2. History of recent subdural hematoma over the left hemisphere.   The episode of hand numbness has come and gone several times before and  after the subdural hematoma drainage.  The patient today, however, reported  a brief episode of aphasia as well.  The episode may represent vasospasm  associated with subdural hematoma, could potentially represent a seizure  type event.  Do need to consider the possibility of intrinsic  cerebrovascular disease as the cause.   I have offered admission to this patient for evaluation with carotid Doppler  study, transcranial Doppler.  The patient still has staples and cannot have  an MRI scan quite yet.  The patient wants to go home at this point.  The  patient will be discharged to home and will follow up with Dr. Wynetta Emery.  I  would recommend these studies be done at some point.  The patient cannot go  on antiplatelet agents at this point.   Thank you very much.                                               Marlan Palau, M.D.    CKW/MEDQ  D:  12/19/2003  T:  12/20/2003  Job:  [id number]   cc:   Brooke Bonito, M.D.  [address]  [place]  [place] [address]  Fax: [phone]   Donalee Citrin, M.D.  [address]  [place]  [place] [address]  Fax: [phone]   [facility]  [address]

## 2010-10-31 NOTE — H&P (Signed)
NAME:  [name] [name], [name] [name] NO.:  [id number]   MEDICAL RECORD NO.:  [id number]           PATIENT TYPE:   LOCATION:                                 FACILITY:   PHYSICIAN:  Elliot Cousin, M.D.         DATE OF BIRTH:   DATE OF ADMISSION:  03/09/2006  DATE OF DISCHARGE:                                HISTORY & PHYSICAL   PRIMARY CARE PHYSICIAN:  Brooke Bonito, M.D.   CHIEF COMPLAINT:  Abdominal pain.   HISTORY OF PRESENT ILLNESS:  The patient is a 70 year old man with past  medical history significant for type 2 diabetes mellitus, hypertension, and  a subacute subdural hematoma in June 2005, who presents to the emergency  department with the Chief Complaint of abdominal pain. The patient's pain  started approximately 3 nights ago.  It was excruciating when it began.  The pain is located primarily over the right lower quadrant and over the  bladder.  The pain radiates to the upper abdomen.  He describes the pain as  a spasm.  Initially it was intermittent, and now the pain has been constant  over the past 24 hours.  The pain is 10/10 in intensity most of the time.  He has had associated nausea but no vomiting.  Up until early this morning,  he had constipation.  He did have a small bowel movement which was brown in  color this morning.  No recent diarrhea.  No pain with urination.  No blood  in the stools or in his urine.  He has had subjective fever but no chills.  His appetite has been very poor, and he has eaten very little over the past  3 days.  The patient actually presented to his primary care physician, Dr.  Juleen China.  The patient was admonished by Dr. Juleen China to come to the emergency  department for further evaluation and management.   During the evaluation in the emergency department, the patient is found to  be febrile with a temperature of 100.3, although he is hemodynamically  stable.  A CT scan of the abdomen and pelvis was ordered by the emergency  department  physician.  In essence, the CT reveals marked inflammatory  process in the right lower quadrant surrounding the cecum and appendix,  nonobstructive mid pole stone on the left, extensive reticular-nodular  densities in the lung bases bilaterally, and a 1.6 cm soft tissue mass-like  density along the right posterior bladder wall.  The patient will be  admitted for further evaluation and management.   PAST MEDICAL HISTORY:  1. Hypertension.  2. Type 2 diabetes mellitus, diagnosed 5 years ago.  3. History of nephrolithiasis status post lithotripsy in September 2005      per Dr. Vonita Moss.  4. Subacute subdural hematoma, status post craniectomy per Dr. Wynetta Emery in      June 2005.  5. Status post tonsillectomy as a child.   MEDICATIONS:  1. Aceon 4 mg daily.  2. Actos 30 mg daily.  3. Amaryl 4 mg daily.  4. Hydrochlorothiazide 25 mg daily.  5. UroXatral 10 mg daily.   ALLERGIES:  No known drug allergies.   SOCIAL HISTORY:  The patient is married.  He has 2 children.  [place]  He is retired.  He denies  tobacco, alcohol, and illicit drug use.   FAMILY HISTORY:  His mother died at 65 years of age secondary to  complications from diabetes mellitus.  His father died of cancer at 75 years  of age.   REVIEW OF SYSTEMS:  The patient's Review of Systems is otherwise negative.   PHYSICAL EXAMINATION:  VITAL SIGNS: Temperature 100.3, blood pressure  139/88, pulse 92, respiratory rate 18, oxygen saturation 96% on room air.  GENERAL: The patient is a pleasant, 70 year old, mildly overweight Caucasian  man who is currently lying in bed in no acute distress at this time;  status  post Dilaudid.  HEENT: Head is normocephalic and atraumatic.  Pupils equal, round, and  reactive to light. Extraocular movements are intact.  Conjunctivae are  clear.  Sclerae are white.  Tympanic membranes are clear bilaterally.  Nasal  mucosa is mildly dry.  No sinus  tenderness.  Oropharynx reveals fairly good  dentition.  Mucous membranes are dry.  No posterior exudates or erythema.  NECK: Supple, no adenopathy, no thyromegaly, no bruit, no JVD.  LUNGS: Decreased breath sounds in the bases, otherwise clear anteriorly.  HEART: S1, S2 with no murmurs, rubs, or gallops.  ABDOMEN:  Hypoactive bowel sounds, obese.  Moderately tender in the right  lower quadrant and suprapubically.  Mild voluntary guarding.  Mild rebound.  The abdomen, however, is primarily soft.  No masses appreciated and no  hepatosplenomegaly.  RECTAL/GU: Deferred.  EXTREMITIES:  Pedal pulses palpable bilaterally. No pretibial edema and no  pedal edema.  NEUROLOGIC: The patient is alert and oriented x3.  Cranial nerves II-XII  intact.  Strength 5/5 throughout.  Sensation is intact.   LABORATORY DATA:  Results of the CT scan of the abdomen and pelvis are  above.   WBC 16.9, absolute neurophil count 13.8, hemoglobin 14.4, platelets 234.  Sodium 137, potassium 3.7, chloride 102, CO2 26, glucose 195, BUN 18,  creatinine 0.9, calcium 9.0, total protein 6.6, albumin 3.3, AST 21, ALT 12,  alkaline phosphatase 49, total bilirubin 2.2.  Lipase 17.  Urinalysis:  Specific gravity 1.034, urine glucose 250, urine bilirubin small, urine  ketones trace, urine protein 100, urine wbc's 3-6.   ASSESSMENT:  1. Right lower quadrant and suprapubic abdominal pain.  The patient's pain      is probably secondary to the CT scan findings which, in essence, reveal      marked inflammation surrounding the cecum and appendix.  It is      uncertain whether or not the patient has acute appendicitis verus      another acute inflammatory process such as diverticulitis or colitis.      It is also unclear whether or not the area of inflammation is secondary      to an abscess.  The patient also has large gallstones and a 1.6 cm mass-      like density in the right posterior bladder wall.  Per his account, he      has a history of gallstones.  2. A 1.6 cm mass-like density of the right posterior bladder wall.  This      mass is worrisome for bladder carcinoma.  3. Extensive radicular-nodular densities of the lung bases bilaterally.  The etiology and significance of these findings is unclear at this      time.  The patient denies any history of tobacco use.  He appears to be      breathing without any difficulty.  4. Fever with leukocytosis.  5. Type 2 diabetes mellitus.  6. Hypertension.   PLAN:  1. The patient will be admitted for further evaluation and management.  2. Will obtain blood cultures x2 and start the patient on empiric      antibiotic treatment with Cipro and Flagyl intravenously.  3. Will consult general surgery tonight and urology in the morning.  4. Will keep the patient n.p.o.  5. Volume repletion with normal saline.  Consider adding dextrose to the      IV fluids.  6. Will also culture the patient's urine.  7. Pain management with Dilaudid and as-needed  Tylenol.  8. Consider obtaining a CT scan of the chest in the next 2-3 days and      repeating the CT scan of the abdomen as well.      Elliot Cousin, M.D.  Electronically Signed     DF/MEDQ  D:  03/09/2006  T:  03/09/2006  Job:  [id number]   cc:   Brooke Bonito, M.D.  Fax: [phone]

## 2010-10-31 NOTE — Discharge Summary (Signed)
NAME:  [name] [name], [name] [name] NO.:  [id number]   MEDICAL RECORD NO.:  [id number]          PATIENT TYPE:  INP   LOCATION:  1521                         FACILITY:  [facility]   PHYSICIAN:  Lonia Blood, M.D.      DATE OF BIRTH:  06/20/1940   DATE OF ADMISSION:  03/08/2006  DATE OF DISCHARGE:  03/15/2006                                 DISCHARGE SUMMARY   PRIMARY CARE PHYSICIAN:  Brooke Bonito, M.D.   DISCHARGE DIAGNOSES:  1. Right lower quadrant abdominal mass, probably appendiceal abscess.  2. Urinary retention.  3. Diabetes type 2.  4. Bladder stones.  5. History of acute subdural hematoma with prior craniotomy in 2005.  6. Mild normocytic anemia, probably chronic disease.   DISCHARGE MEDICATIONS:  1. Ciprofloxacin 500 mg p.o. b.i.d. x7 days.  2. Flagyl 500 mg p.o. t.i.d. x7 days.  3. Ditropan 10 mg p.o. t.i.d.  4. Lisinopril 5 mg p.o. daily.  5. Avodart 0.5 mg daily.  6. Patient also on Actos 30 mg daily.  7. Glimepiride 4 mg daily.  8. Hydrochlorothiazide 25 mg daily.  9. UroXatral 10 mg daily.   DISPOSITION:  Patient currently has a Foley placed.  He failed the first  voiding trial.  He is scheduled to go home after a voiding trial again this  morning, if successful.  Otherwise, urology will make a decision whether or  not patient will go home with a Foley.   PROCEDURE:  1. Procedures performed this admission included a chest x-ray/abdominal x-      ray on March 08, 2006 showed elevated left hemidiaphragm, which      seemed to be chronic, large gallstones in the right upper quadrant, no      definite renal calculi.  2. CT abdomen and pelvis showed marked inflammatory process in the right      lower quadrant, surrounding the cecum and appendix, probably started as      appendicitis.  A few of extraluminal gas.  No well-defined abscess.      Also 1.6 cm soft tissue mass along the right posterior bladder wall,      concerning for transitional cell cancer.   Recommendation for      cystoscopy.  3. A repeat CT of the abdomen and pelvis on September 29th shows little      change in inflammatory process in the right lower quadrant extending      into the right pelvis with some extra luminal air bubbles.  Also, edema      of the base of the cecum and distal ileum.  Bibasilar atelectasis with      small right effusion now present.  Large gallstones.  Also, no change      in inflammatory process in the right lower quadrant.  No change,      previously described, bladder nodule, which is suspicious for neoplasm.   CONSULTATIONS:  1. [facility] Surgery, Dr. Baruch Merl.  2. Urology, Dr. Gaynelle Arabian.   BRIEF HISTORY AND PHYSICAL:  Please refer to dictated history and physical  on admission by  Dr. Elliot Cousin.  In short, however, this is a 70 year old  gentleman with a history of type 2 diabetes, hypertension, and subdural  hematoma.  Patient presented with a complaint of abdominal pain.  The pain  was going on for three days prior to presentation and mainly at the right  lower quadrant.  It also goes all the way to the upper abdomen.  It was  constant and rated as a 10/10 at the time.  No other suspicious symptoms  except some problem with urination.  The patient was mildly febrile with a  temperature of 100.3.   CT, abdomen and pelvis, at that point showed findings above, consistent with  a mass in the right lower quadrant and a soft tissue mass in the bladder.  He was subsequently admitted for further management.   HOSPITAL COURSE:  1. Right lower quadrant abdominal mass:  This is most likely an abscess.      Patient was started on IV Cipro and Flagyl, which he seemed to have      responded to.  His symptoms have improved dramatically, although there      is no CT evidence of resolution.  Surgery followed patient through.      The decision is not to do any surgery at this point and rather follow      patient closely, since he has  responded to antibiotics.  At this point,      the patient has been stabilized.  I will have a followup with Dr. Colin Benton      in two weeks.  Further management could be planned at that time.  2. Bladder wall mass and stone:  Dr. Earlene Plater has been consulted, and urology      has been following patient as well.  He has had problems voiding.  He      failed a voiding trial on March 14, 2006.  He currently has his      Foley in place, and a trial will also be done today.  Avodart,      UroXatral, and Ditropan were added to his regimen.  We will defer to      urology for further management.  3. Hypertension:  His blood pressure was well controlled with additional      lisinopril.  He continues on his hydrochlorothiazide at this point.  4. Diabetes:  His blood sugars have been visibly controlled, requiring his      home medications at this point.  No further adjustment is being made in      the hospital.   Patient is therefore ready for discharge home, from our perspective, to  follow up with both urology and general surgery.      Lonia Blood, M.D.  Electronically Signed     LG/MEDQ  D:  03/15/2006  T:  03/15/2006  Job:  [id number]

## 2010-10-31 NOTE — Consult Note (Signed)
NAME:  [name] [name], [name] [name] NO.:  [id number]   MEDICAL RECORD NO.:  [id number]          PATIENT TYPE:  INP   LOCATION:  1521                         FACILITY:  [facility]   PHYSICIAN:  [name] [name], M.D.  DATE OF BIRTH:  [birth year]   DATE OF CONSULTATION:  03/12/2006  DATE OF DISCHARGE:                                   CONSULTATION   HISTORY OF PRESENT ILLNESS:  This is a very nice 70 year old white male who  is well known to Dr. Vonita [name].  He was admitted to the hospital for  abdominal pain, was found to have a diverticular abscess which is being  treated medically with antibiotics.  He has been doing well but he is having  some difficulty urinating.  He has been maintained on UroXatral at home and  had been doing well with that.  He has also suffered significant ureteral  stones previously and had manipulations with lithotripsies and other  treatments by Dr. Vonita [name] in the past.  A CT scan for follow-up of the  diverticular abscess revealed multiple bladder stones, a left renal stone  which was peripheral and inert and an unexpected 1.6 cm right posterior  bladder wall mass with slight calcification.  He had been voiding well until  today but he has developed hesitancy, urgency, frequency and bladder pain.   PAST MEDICAL HISTORY:  He has no known allergies.  He has had craniotomy for  subdural hematoma with a bur hole in 2005.  He has had tonsillectomy,  vasectomy and the stone treatment as noted above.  Medical illnesses:  He  has hypertension, subdural hematoma.  He is diabetic and has the  diverticular abscess as noted.   MEDICATIONS:  He is currently on Aceon, Actos, Glimepiride,  hydrochlorothiazide, UroXatral and he is on sliding scale insulin and  appropriate IV antibiotics along with Lisinopril.   SOCIAL HISTORY:  He is a negative drinker, negative smoker.   FAMILY HISTORY:  Not significant.   REVIEW OF SYSTEMS:  He has no shortness of breath, dyspnea on  exertion,  chest pain.  He has had abdominal pain which is resolving.  He has also had  the voiding symptoms with urgency, frequency and hesitancy.  He has no  diaphoresis.  He has no thyroid symptoms.  He has no skin lesions or other  pertinent complaints.  He has had no gross hematuria that he has seen and no  eye problems, blurry vision, etc. All other systems negative.   PHYSICAL EXAMINATION:  VITAL SIGNS:  Blood pressure is 142/68, pulse 78,  respirations 20, temperature afebrile.  GENERAL:  He is well-nourished, well-developed, mild distress, oriented x3.  HEAD, EARS, NOSE AND THROAT:  Normal.  NECK:  Without masses or thyromegaly.  CHEST:  Normal diaphragmatic motion.  HEART:  Normal sinus rhythm without murmurs or gallops.  ABDOMEN:  Right lower quadrant tenderness from the abscess which is not  palpable.  He is slightly tender.  He has no other abnormalities.  Suprapubically he is tender and feel the bladder is most likely palpable.  EXTREMITIES:  Normal.  NEUROLOGIC:  Intact.  GENITALIA:  Penis and testicles without lesions.   IMPRESSION:  1. Probable urinary retention.  2. Bladder stones which need removal.  3. Probable soft tissue mass of the bladder, questionable transitional      cell carcinoma.  4. Small inert left renal stone.   PLAN:  1. Foley catheter.  2. Will add Avodart to UroXatral.  3. Voiding trial on Sunday.  4. He may be able to go home Sunday or Monday from my standpoint.  Follow-      up with Dr. Vonita [name] electively for definitive management.  If he fails      a voiding trial will certainly have to deal with these issues next week      in the hospital.   Dr. Brunilda [name], our partner to see over the weekend.  Please call as needed.      [name] [name], M.D.  Electronically Signed     RLD/MEDQ  D:  03/12/2006  T:  03/14/2006  Job:  [id number]   cc:   [name] Ras, MD  [address]  [place]  [place] [address]

## 2010-10-31 NOTE — H&P (Signed)
NAME:  [name] [name], [name] [name] NO.:  [id number]   MEDICAL RECORD NO.:  [id number]          PATIENT TYPE:  INP   LOCATION:  0007                         FACILITY:  [facility]   PHYSICIAN:  Lucrezia Starch. Earlene Plater, M.D.  DATE OF BIRTH:  08-25-1940   DATE OF ADMISSION:  04/28/2006  DATE OF DISCHARGE:                                HISTORY & PHYSICAL   CHIEF COMPLAINT:  I can't urinate.   HISTORY OF PRESENT ILLNESS:  Mr. [name] [name] is a very nice, 70 year old white  male who presented with irritative voiding and blood in his urine.  At  workup he was found to have bladder stones and a bladder tumor along with  significant BPH.  He underwent resection of the bladder tumor and removal of  stones.  The bladder tumor was low-grade superficial, transitional cell  carcinoma, and it was felt that resection of his prostate, at the same time,  was not safe.  He subsequently developed urinary retention and had been  maintained on a Foley catheter and it was felt that the bladder was now  healed.  He expressed understanding the risks, benefits, and alternatives;  and elected to proceed with cystoscopy and TURP.   PAST MEDICAL HISTORY/ALLERGIES:  He has no known allergies.   MEDICATIONS:  1. Halcion.  2. Actos.  3. Glyburide.  4. Hydrochlorothiazide.  5. UroXatral.  6. Ciprofloxacin.   ILLNESSES:  1. He has hypertension.  2. Diabetes type 2.  3. He had gallstones.  4. He denies any cardiac symptoms.  He has had a subdural hematoma.   SURGERIES:  1. He had a bur hole subdural hematoma.  2. He had a bladder surgery as noted.  3. Vasectomy.  4. Tonsillectomy.  5. Appendectomy.  6. He had an abscess.  7. He has had no anesthesia problems.   SOCIAL HISTORY:  Negative smoker.  Negative drinker.   FAMILY HISTORY:  Insignificant.   REVIEW OF SYSTEMS:  He has no shortness of breath, dyspnea on exertion,  chest pain, or GI complaint.  He is in urinary retention and has a Foley  catheter.   PHYSICAL EXAMINATION:  VITAL SIGNS:  Blood pressure is 147/93, pulse 80,  respirations 20, temperature 96 degrees Fahrenheit.  GENERAL:  He is well-nourished, well-developed, well-groomed, oriented x3.  HEAD, EARS, NOSE, AND THROAT:  Normal.  NECK:  Supple.  No masses or thyromegaly.  CHEST:  Has normal diaphragmatic motion.  ABDOMEN:  Soft, nontender, without masses, organomegaly or hernias.  EXTREMITIES:  Normal.  NEUROLOGIC:  Intact.  SKIN:  Normal.  GENITALIA:  Penis, meatus, scrotum, testicle,  adnexa, anus, and perineum  were normal.  Foley catheter is in place.  RECTAL:  Rectal vault is empty.  Prostate is 35 gm, smooth, symmetrical and  benign.   IMPRESSION:  History of bladder tumor and bladder stones now benign  prostatic hypertrophy urinary retention.   PLAN:  Cystoscopy and transurethral resection of the prostate.      [name], M.D.  Electronically Signed     RLD/MEDQ  D:  04/28/2006  T:  04/28/2006  Job:  [id number]

## 2011-01-01 ENCOUNTER — Other Ambulatory Visit: Payer: Self-pay | Admitting: Dermatology

## 2011-02-12 ENCOUNTER — Other Ambulatory Visit: Payer: Self-pay | Admitting: Dermatology

## 2011-03-23 LAB — I-STAT 8, (EC8 V) (CONVERTED LAB)
Acid-Base Excess: 5 — ABNORMAL HIGH
BUN: 15
Bicarbonate: 27.4 — ABNORMAL HIGH
Chloride: 107
Glucose, Bld: 129 — ABNORMAL HIGH
HCT: 41
Hemoglobin: 13.9
Operator id: 114531
Potassium: 3.8
Sodium: 139
TCO2: 28
pCO2, Ven: 33.8 — ABNORMAL LOW
pH, Ven: 7.516 — ABNORMAL HIGH

## 2011-03-24 LAB — DIFFERENTIAL
Basophils Absolute: 0
Basophils Absolute: 0
Basophils Relative: 0
Eosinophils Relative: 0
Eosinophils Relative: 0
Lymphocytes Relative: 3 — ABNORMAL LOW
Lymphs Abs: 0.5 — ABNORMAL LOW
Monocytes Absolute: 2 — ABNORMAL HIGH
Monocytes Absolute: 1.6 — ABNORMAL HIGH
Neutro Abs: 9.8 — ABNORMAL HIGH
Neutro Abs: 13.9 — ABNORMAL HIGH

## 2011-03-24 LAB — BASIC METABOLIC PANEL
BUN: 14
BUN: 20
CO2: 26
CO2: 27
Calcium: 8.3 — ABNORMAL LOW
Calcium: 8.4
Calcium: 8.6
Creatinine, Ser: 0.98
GFR calc Af Amer: >60
GFR calc non Af Amer: >60
GFR calc non Af Amer: >60
Glucose, Bld: 150 — ABNORMAL HIGH
Glucose, Bld: 178 — ABNORMAL HIGH
Sodium: 135
Sodium: 135

## 2011-03-24 LAB — LIPID PANEL
HDL: 30 — ABNORMAL LOW
Total CHOL/HDL Ratio: 4.6

## 2011-03-24 LAB — COMPREHENSIVE METABOLIC PANEL
AST: 21
Albumin: 3.8
BUN: 19
Calcium: 8.9
Chloride: 98
Creatinine, Ser: 1.46
GFR calc Af Amer: 59 — ABNORMAL LOW
GFR calc non Af Amer: 49 — ABNORMAL LOW
Total Bilirubin: 1.5 — ABNORMAL HIGH

## 2011-03-24 LAB — CBC
HCT: 34.5 — ABNORMAL LOW
HCT: 40.5
Hemoglobin: 11.8 — ABNORMAL LOW
Hemoglobin: 12.3 — ABNORMAL LOW
MCHC: 35.8
MCHC: 34.9
MCV: 81.8
Platelets: 185
Platelets: 224
RBC: 4.08 — ABNORMAL LOW
RDW: 13.5
RDW: 13.8
WBC: 9.3

## 2011-03-24 LAB — URINE CULTURE
Colony Count: NO GROWTH
Culture: NO GROWTH
Special Requests: NEGATIVE

## 2011-03-24 LAB — HEMOGLOBIN A1C
Hgb A1c MFr Bld: 6.9 — ABNORMAL HIGH
Mean Plasma Glucose: 175

## 2011-03-24 LAB — CULTURE, BLOOD (ROUTINE X 2)

## 2011-03-24 LAB — LIPASE, BLOOD: Lipase: 16

## 2011-03-24 LAB — T4, FREE: Free T4: 1.23

## 2011-06-30 DIAGNOSIS — Z85828 Personal history of other malignant neoplasm of skin: Secondary | ICD-10-CM | POA: Diagnosis not present

## 2011-08-19 DIAGNOSIS — E119 Type 2 diabetes mellitus without complications: Secondary | ICD-10-CM | POA: Diagnosis not present

## 2011-08-27 DIAGNOSIS — E789 Disorder of lipoprotein metabolism, unspecified: Secondary | ICD-10-CM | POA: Diagnosis not present

## 2011-08-27 DIAGNOSIS — E119 Type 2 diabetes mellitus without complications: Secondary | ICD-10-CM | POA: Diagnosis not present

## 2011-08-27 DIAGNOSIS — I10 Essential (primary) hypertension: Secondary | ICD-10-CM | POA: Diagnosis not present

## 2011-08-27 DIAGNOSIS — N400 Enlarged prostate without lower urinary tract symptoms: Secondary | ICD-10-CM | POA: Diagnosis not present

## 2011-11-18 DIAGNOSIS — Z125 Encounter for screening for malignant neoplasm of prostate: Secondary | ICD-10-CM | POA: Diagnosis not present

## 2011-11-25 DIAGNOSIS — N401 Enlarged prostate with lower urinary tract symptoms: Secondary | ICD-10-CM | POA: Diagnosis not present

## 2011-11-25 DIAGNOSIS — C679 Malignant neoplasm of bladder, unspecified: Secondary | ICD-10-CM | POA: Diagnosis not present

## 2011-11-25 DIAGNOSIS — N200 Calculus of kidney: Secondary | ICD-10-CM | POA: Diagnosis not present

## 2011-11-27 ENCOUNTER — Other Ambulatory Visit: Payer: Self-pay | Admitting: Urology

## 2011-12-07 ENCOUNTER — Encounter (HOSPITAL_COMMUNITY): Payer: Self-pay | Admitting: Pharmacy Technician

## 2011-12-11 ENCOUNTER — Encounter (HOSPITAL_COMMUNITY)
Admission: RE | Admit: 2011-12-11 | Discharge: 2011-12-11 | Disposition: A | Discharge status: Home / Self Care | Payer: Medicare Other | Source: Ambulatory Visit | Attending: Urology | Admitting: Urology

## 2011-12-11 ENCOUNTER — Ambulatory Visit (HOSPITAL_COMMUNITY)
Admission: RE | Admit: 2011-12-11 | Discharge: 2011-12-11 | Disposition: A | Discharge status: Home / Self Care | Payer: Medicare Other | Source: Ambulatory Visit | Attending: Urology | Admitting: Urology

## 2011-12-11 ENCOUNTER — Encounter (HOSPITAL_COMMUNITY): Payer: Self-pay

## 2011-12-11 DIAGNOSIS — C679 Malignant neoplasm of bladder, unspecified: Secondary | ICD-10-CM | POA: Diagnosis not present

## 2011-12-11 DIAGNOSIS — F0390 Unspecified dementia without behavioral disturbance: Secondary | ICD-10-CM

## 2011-12-11 DIAGNOSIS — I10 Essential (primary) hypertension: Secondary | ICD-10-CM

## 2011-12-11 DIAGNOSIS — Z01812 Encounter for preprocedural laboratory examination: Secondary | ICD-10-CM | POA: Diagnosis not present

## 2011-12-11 DIAGNOSIS — Z01810 Encounter for preprocedural cardiovascular examination: Secondary | ICD-10-CM | POA: Insufficient documentation

## 2011-12-11 DIAGNOSIS — Z01818 Encounter for other preprocedural examination: Secondary | ICD-10-CM | POA: Diagnosis not present

## 2011-12-11 DIAGNOSIS — E119 Type 2 diabetes mellitus without complications: Secondary | ICD-10-CM | POA: Insufficient documentation

## 2011-12-11 HISTORY — DX: Essential (primary) hypertension: I10

## 2011-12-11 HISTORY — PX: TONSILLECTOMY: SUR1361

## 2011-12-11 HISTORY — PX: BLADDER SURGERY: SHX569

## 2011-12-11 HISTORY — PX: LITHOTRIPSY: SUR834

## 2011-12-11 HISTORY — DX: Unspecified dementia without behavioral disturbance: F03.90

## 2011-12-11 HISTORY — DX: Unspecified dementia, unspecified severity, without behavioral disturbance, psychotic disturbance, mood disturbance, and anxiety: F03.90

## 2011-12-11 HISTORY — DX: Malignant (primary) neoplasm, unspecified: C80.1

## 2011-12-11 LAB — CBC
HCT: 42.4 % (ref 39.0–52.0)
Hemoglobin: 14.8 g/dL (ref 13.0–17.0)
MCH: 28.5 pg (ref 26.0–34.0)
MCV: 81.7 fL (ref 78.0–100.0)
Platelets: 229 10*3/uL (ref 150–400)
RBC: 5.19 MIL/uL (ref 4.22–5.81)
WBC: 9.8 10*3/uL (ref 4.0–10.5)

## 2011-12-11 LAB — BASIC METABOLIC PANEL
CO2: 26 mEq/L (ref 19–32)
GFR calc non Af Amer: >90 mL/min (ref >90)
Glucose, Bld: 171 mg/dL — ABNORMAL HIGH (ref 70–99)
Potassium: 3.9 mEq/L (ref 3.5–5.1)
Sodium: 139 mEq/L (ref 135–145)

## 2011-12-11 LAB — SURGICAL PCR SCREEN: Staphylococcus aureus: NEGATIVE

## 2011-12-11 NOTE — Patient Instructions (Signed)
20 [name] [name]  12/11/2011   Your procedure is scheduled on:7-8   -2013  Report to [facility] at    0630    AM.  Call this number if you have problems the morning of surgery: [phone]   Remember:   Do not eat food:After Midnight.   Take these medicines the morning of surgery with A SIP OF WATER: Avodart. Donot take any Diabetic meds or Blood/Pressur med AM of surgery.   Do not wear jewelry, make-up or nail polish.  Do not wear lotions, powders, or perfumes. You may wear deodorant.  Do not shave 48 hours prior to surgery.(face and neck okay, no shaving of legs)  Do not bring valuables to the hospital.  Contacts, dentures or bridgework may not be worn into surgery.  Leave suitcase in the car. After surgery it may be brought to your room.  For patients admitted to the hospital, checkout time is 11:00 AM the day of discharge.   Patients discharged the day of surgery will not be allowed to drive home.  Name and phone number of your driver: spouse  Special Instructions: CHG Shower Use Special Wash: 1/2 bottle night before surgery and 1/2 bottle morning of surgery.(avoid face and genitals)   Please read over the following fact sheets that you were given: MRSA Information.

## 2011-12-21 ENCOUNTER — Ambulatory Visit (HOSPITAL_COMMUNITY)
Admission: RE | Admit: 2011-12-21 | Discharge: 2011-12-21 | Disposition: A | Discharge status: Home / Self Care | Payer: Medicare Other | Source: Ambulatory Visit | Attending: Urology | Admitting: Urology

## 2011-12-21 ENCOUNTER — Ambulatory Visit (HOSPITAL_COMMUNITY): Payer: Medicare Other | Admitting: Anesthesiology

## 2011-12-21 ENCOUNTER — Encounter (HOSPITAL_COMMUNITY)
Admission: RE | Disposition: A | Discharge status: Home / Self Care | Payer: Self-pay | Source: Ambulatory Visit | Attending: Urology

## 2011-12-21 ENCOUNTER — Encounter (HOSPITAL_COMMUNITY): Payer: Self-pay | Admitting: *Deleted

## 2011-12-21 ENCOUNTER — Encounter (HOSPITAL_COMMUNITY): Payer: Self-pay | Admitting: Anesthesiology

## 2011-12-21 DIAGNOSIS — D3020 Benign neoplasm of unspecified ureter: Secondary | ICD-10-CM | POA: Diagnosis not present

## 2011-12-21 DIAGNOSIS — E119 Type 2 diabetes mellitus without complications: Secondary | ICD-10-CM | POA: Diagnosis not present

## 2011-12-21 DIAGNOSIS — N3090 Cystitis, unspecified without hematuria: Secondary | ICD-10-CM | POA: Diagnosis not present

## 2011-12-21 DIAGNOSIS — I10 Essential (primary) hypertension: Secondary | ICD-10-CM | POA: Diagnosis not present

## 2011-12-21 DIAGNOSIS — C679 Malignant neoplasm of bladder, unspecified: Secondary | ICD-10-CM | POA: Diagnosis not present

## 2011-12-21 DIAGNOSIS — Z8551 Personal history of malignant neoplasm of bladder: Secondary | ICD-10-CM | POA: Insufficient documentation

## 2011-12-21 DIAGNOSIS — N138 Other obstructive and reflux uropathy: Secondary | ICD-10-CM | POA: Insufficient documentation

## 2011-12-21 DIAGNOSIS — Z79899 Other long term (current) drug therapy: Secondary | ICD-10-CM | POA: Insufficient documentation

## 2011-12-21 DIAGNOSIS — N401 Enlarged prostate with lower urinary tract symptoms: Secondary | ICD-10-CM | POA: Diagnosis not present

## 2011-12-21 DIAGNOSIS — N135 Crossing vessel and stricture of ureter without hydronephrosis: Secondary | ICD-10-CM | POA: Diagnosis not present

## 2011-12-21 DIAGNOSIS — Z87442 Personal history of urinary calculi: Secondary | ICD-10-CM | POA: Diagnosis not present

## 2011-12-21 HISTORY — PX: TRANSURETHRAL RESECTION OF BLADDER TUMOR: SHX2575

## 2011-12-21 HISTORY — PX: CYSTOSCOPY W/ RETROGRADES: SHX1426

## 2011-12-21 HISTORY — PX: CYSTOSCOPY WITH BIOPSY: SHX5122

## 2011-12-21 LAB — GLUCOSE, CAPILLARY
Glucose-Capillary: 205 mg/dL — ABNORMAL HIGH (ref 70–99)
Glucose-Capillary: 218 mg/dL — ABNORMAL HIGH (ref 70–99)

## 2011-12-21 SURGERY — TURBT (TRANSURETHRAL RESECTION OF BLADDER TUMOR)
Anesthesia: General | Site: Ureter | Laterality: Right | Wound class: Clean Contaminated

## 2011-12-21 MED ORDER — MIDAZOLAM HCL 5 MG/5ML IJ SOLN
INTRAMUSCULAR | Status: DC | PRN
  Administered 2011-12-21: 1 mg via INTRAVENOUS

## 2011-12-21 MED ORDER — HYDROCODONE-ACETAMINOPHEN 5-325 MG PO TABS: 1.0000 | ORAL_TABLET | Freq: Four times a day (QID) | ORAL | Status: DC | PRN

## 2011-12-21 MED ORDER — PROPOFOL 10 MG/ML IV EMUL
INTRAVENOUS | Status: DC | PRN
  Administered 2011-12-21: 150 mg via INTRAVENOUS

## 2011-12-21 MED ORDER — IOHEXOL 300 MG/ML  SOLN
INTRAMUSCULAR | Status: AC
  Filled 2011-12-21: qty 1

## 2011-12-21 MED ORDER — ACETAMINOPHEN 10 MG/ML IV SOLN
INTRAVENOUS | Status: DC | PRN
  Administered 2011-12-21: 1000 mg via INTRAVENOUS

## 2011-12-21 MED ORDER — FENTANYL CITRATE 0.05 MG/ML IJ SOLN
INTRAMUSCULAR | Status: DC | PRN
  Administered 2011-12-21: 50 ug via INTRAVENOUS
  Administered 2011-12-21 (×2): 25 ug via INTRAVENOUS

## 2011-12-21 MED ORDER — HYDROCODONE-ACETAMINOPHEN 5-300 MG PO TABS: 1.0000 | ORAL_TABLET | Freq: Four times a day (QID) | ORAL | Status: DC

## 2011-12-21 MED ORDER — ACETAMINOPHEN 10 MG/ML IV SOLN
INTRAVENOUS | Status: AC
  Filled 2011-12-21: qty 100

## 2011-12-21 MED ORDER — SODIUM CHLORIDE 0.9 % IR SOLN
Status: DC | PRN
  Administered 2011-12-21: 3000 mL via INTRAVESICAL

## 2011-12-21 MED ORDER — HYDROCODONE-ACETAMINOPHEN 5-325 MG PO TABS
ORAL_TABLET | ORAL | Status: AC
  Administered 2011-12-21: 1
  Filled 2011-12-21: qty 1

## 2011-12-21 MED ORDER — HYDROMORPHONE HCL PF 1 MG/ML IJ SOLN: 0.2500 mg | INTRAMUSCULAR | Status: DC | PRN

## 2011-12-21 MED ORDER — CIPROFLOXACIN IN D5W 400 MG/200ML IV SOLN
INTRAVENOUS | Status: AC
  Filled 2011-12-21: qty 200

## 2011-12-21 MED ORDER — ONDANSETRON HCL 4 MG/2ML IJ SOLN
INTRAMUSCULAR | Status: DC | PRN
  Administered 2011-12-21 (×2): 2 mg via INTRAVENOUS

## 2011-12-21 MED ORDER — CIPROFLOXACIN HCL 500 MG PO TABS: 500.0000 mg | ORAL_TABLET | Freq: Two times a day (BID) | ORAL | Status: AC

## 2011-12-21 MED ORDER — IOHEXOL 300 MG/ML  SOLN
INTRAMUSCULAR | Status: DC | PRN
  Administered 2011-12-21: 20 mL

## 2011-12-21 MED ORDER — CIPROFLOXACIN IN D5W 400 MG/200ML IV SOLN
400.0000 mg | INTRAVENOUS | Status: AC
  Administered 2011-12-21: 400 mg via INTRAVENOUS

## 2011-12-21 MED ORDER — LIDOCAINE HCL (CARDIAC) 20 MG/ML IV SOLN
INTRAVENOUS | Status: DC | PRN
  Administered 2011-12-21: 30 mg via INTRAVENOUS

## 2011-12-21 MED ORDER — LACTATED RINGERS IV SOLN
INTRAVENOUS | Status: DC | PRN
  Administered 2011-12-21: 08:00:00 via INTRAVENOUS

## 2011-12-21 SURGICAL SUPPLY — 26 items
ADAPTER CATH URET PLST 4-6FR (CATHETERS) ×5 IMPLANT
ADPR CATH URET STRL DISP 4-6FR (CATHETERS) ×1
BAG URINE DRAINAGE (UROLOGICAL SUPPLIES) IMPLANT
BAG URO CATCHER STRL LF (DRAPE) ×5 IMPLANT
BRUSH URET BIOPSY 3F (UROLOGICAL SUPPLIES) ×5 IMPLANT
CATH INTERMIT  6FR 70CM (CATHETERS) ×5 IMPLANT
CATH URET 5FR 28IN CONE TIP (BALLOONS)
CATH URET 5FR 70CM CONE TIP (BALLOONS) IMPLANT
CLOTH BEACON ORANGE TIMEOUT ST (SAFETY) ×5 IMPLANT
DRAPE CAMERA CLOSED 9X96 (DRAPES) ×5 IMPLANT
ELECT REM PT RETURN 9FT ADLT (ELECTROSURGICAL)
ELECTRODE REM PT RTRN 9FT ADLT (ELECTROSURGICAL) IMPLANT
EVACUATOR MICROVAS BLADDER (UROLOGICAL SUPPLIES) IMPLANT
GLOVE BIOGEL M STRL SZ7.5 (GLOVE) ×5 IMPLANT
GOWN STRL NON-REIN LRG LVL3 (GOWN DISPOSABLE) ×10 IMPLANT
GUIDEWIRE STR DUAL SENSOR (WIRE) ×5 IMPLANT
KIT ASPIRATION TUBING (SET/KITS/TRAYS/PACK) IMPLANT
LASER FIBER DISP (UROLOGICAL SUPPLIES) IMPLANT
LOOPS RESECTOSCOPE DISP (ELECTROSURGICAL) IMPLANT
MANIFOLD NEPTUNE II (INSTRUMENTS) ×5 IMPLANT
NS IRRIG 1000ML POUR BTL (IV SOLUTION) ×5 IMPLANT
PACK CYSTO (CUSTOM PROCEDURE TRAY) ×5 IMPLANT
RUMI II 3.0CM BLUE KOH-EFFICIE (DISPOSABLE) ×5 IMPLANT
SYRINGE IRR TOOMEY STRL 70CC (SYRINGE) IMPLANT
TUBING CONNECTING 10 (TUBING) ×5 IMPLANT
WIRE COONS/BENSON .038X145CM (WIRE) ×5 IMPLANT

## 2011-12-21 NOTE — Anesthesia Postprocedure Evaluation (Signed)
  Anesthesia Post-op Note  Patient: [name] [name]  Procedure(s) Performed: Procedure(s) (LRB): TRANSURETHRAL RESECTION OF BLADDER TUMOR (TURBT) (N/A) CYSTOSCOPY WITH RETROGRADE PYELOGRAM (Bilateral) CYSTOSCOPY WITH BIOPSY ()  Patient Location: PACU  Anesthesia Type: General  Level of Consciousness: oriented and sedated  Airway and Oxygen Therapy: Patient Spontanous Breathing and Patient connected to nasal cannula oxygen  Post-op Pain: mild  Post-op Assessment: Post-op Vital signs reviewed, Patient's Cardiovascular Status Stable, Respiratory Function Stable and Patent Airway  Post-op Vital Signs: stable  Complications: No apparent anesthesia complications

## 2011-12-21 NOTE — Anesthesia Preprocedure Evaluation (Addendum)
Anesthesia Evaluation  Patient identified by MRN, date of birth, ID band Patient awake    Reviewed: Allergy & Precautions, H&P , NPO status , Patient's Chart, lab work & pertinent test results, reviewed documented beta blocker date and time   Airway Mallampati: II TM Distance: >3 FB Neck ROM: Full    Dental   Pulmonary neg pulmonary ROS,  breath sounds clear to auscultation        Cardiovascular hypertension, Pt. on medications Rhythm:Regular Rate:Normal  Denies cardiac symptoms   Neuro/Psych Dementia Hx subdural hemorrhage, previously evacuated negative psych ROS   GI/Hepatic negative GI ROS, Neg liver ROS,   Endo/Other  Type 2Fair control CBG  Renal/GU negative Renal ROS   Bladder lesion    Musculoskeletal negative musculoskeletal ROS (+)   Abdominal   Peds negative pediatric ROS (+)  Hematology negative hematology ROS (+)   Anesthesia Other Findings   Reproductive/Obstetrics negative OB ROS                          Anesthesia Physical Anesthesia Plan  ASA: III  Anesthesia Plan: General   Post-op Pain Management:    Induction: Intravenous  Airway Management Planned: LMA  Additional Equipment:   Intra-op Plan:   Post-operative Plan: Extubation in OR  Informed Consent: I have reviewed the patients History and Physical, chart, labs and discussed the procedure including the risks, benefits and alternatives for the proposed anesthesia with the patient or authorized representative who has indicated his/her understanding and acceptance.     Plan Discussed with: CRNA and Surgeon  Anesthesia Plan Comments: (Pt doesn't want regional)        Anesthesia Quick Evaluation

## 2011-12-21 NOTE — Progress Notes (Signed)
61 Spoke with Dr. Rica Mast re; CBG 218 repeated got 205 takes Metformin 1000 mg Bid and Onglyza 5 mg q am no new order obtained

## 2011-12-21 NOTE — Op Note (Signed)
Preoperative diagnosis: 1. Bladder tumor with history of urothelial carcinoma of the bladder  Postoperative diagnosis:  1. Bladder tumor (1.5 cm) with history of urothelial carcinoma of the bladder 2. Left ureteral stricture  Procedure:  1. Cystoscopy 2. Transurethral resection of bladder tumor (1.5 cm) 3. Left ureteroscopy with brush biopsy 4. Bilateral retrograde pyelography with interpretation 5. Pelvic examination under anesthesia  Surgeon: Moody Bruins. M.D.  Anesthesia: General  Complications: None  Intraoperative findings:  1. Bladder tumor: A 1.5 cm papillary bladder tumor was identified just to the right of the right ureteral orifice just distal to the bladder neck. 2. Retrograde pyelography: Left RPG revealed a 2 cm stricture in the distal left ureter. There were no other filling defects in the left ureter or renal collecting system.  The right RPG was unremarkable without filling defects or other abnormalities in the ureter or renal collecting system.  EBL: Minimal  Specimens: 1. Brush biopsy of left ureteral stricture 2. Bladder tumor 3. Base of bladder tumor  Disposition of specimens: Pathology  Indication: [name] [name] is a patient with a history of bladder cancer who was found to have a bladder tumor on surveillance office cystoscopy. After reviewing the management options for treatment, he elected to proceed with the above surgical procedure(s). We have discussed the potential benefits and risks of the procedure, side effects of the proposed treatment, the likelihood of the patient achieving the goals of the procedure, and any potential problems that might occur during the procedure or recuperation. Informed consent has been obtained.  Description of procedure:  The patient was taken to the operating room and general anesthesia was induced.  The patient was placed in the dorsal lithotomy position, prepped and draped in the usual sterile fashion, and  preoperative antibiotics were administered. A preoperative time-out was performed.   Cystourethroscopy was performed.  The patient's urethra was examined and demonstrated some right lateral lobe hypertrophy of the right prostatic lobe.   The bladder was then systematically examined in its entirety. There was a 1.5 cm papillary bladder tumor near the bladder neck just to the right of the right ureteral orifice.  No other abnormalities were noted.  Attention then turned to the left ureteral orifice and a ureteral catheter was used to intubate the ureteral orifice.  Omnipaque contrast was injected through the ureteral catheter and a retrograde pyelogram was performed with findings as dictated above. A 0.38 guidewire was inserted up into the right renal collecting system under fluoroscopic guidance. A semirigid ureteroscope was then used to visualize the ureter up to the level of the iliac vessels.  The 2 cm strictured area seen on RPG was unremarkable on ureteroscopy.  Due to his history of urothelial carcinoma, a brush biopsy of the stricture was performed and sent for cytologic analysis.  Attention then turned to the right ureteral orifice and a ureteral catheter was used to intubate the ureteral orifice.  Omnipaque contrast was injected through the ureteral catheter and a retrograde pyelogram was performed with findings as dictated above.  The bladder was then re-examined after the resectoscope was placed.  The bladder tumor was 1.5 cm as previously mentioned. Using loop cautery resection with the Gyrus bipolar loop, the entire tumor was resected and removed for permanent pathologic analysis. Care was taken to avoid the right ureteral orifice.  Separate biopsies were also taken of the underlying deep bladder tissue and sent as a separate specimen.   Hemostasis was then achieved with the loop cautery  and the bladder was emptied and reinspected with no further bleeding noted at the end of the procedure.     The bladder was then emptied and the procedure ended. A pelvic exam under anesthesia was performed and was unremarkable.  No prostate nodularity or pelvic masses were appreciated. The patient appeared to tolerate the procedure well and without complications.  The patient was able to be awakened and transferred to the recovery unit in satisfactory condition.    Moody Bruins MD

## 2011-12-21 NOTE — Transfer of Care (Signed)
Immediate Anesthesia Transfer of Care Note  Patient: [name] [name]  Procedure(s) Performed: Procedure(s) (LRB): TRANSURETHRAL RESECTION OF BLADDER TUMOR (TURBT) (N/A) CYSTOSCOPY WITH RETROGRADE PYELOGRAM (Bilateral) CYSTOSCOPY WITH BIOPSY ()  Patient Location: PACU  Anesthesia Type: General  Level of Consciousness: awake and sedated  Airway & Oxygen Therapy: Patient Spontanous Breathing and Patient connected to face mask oxygen  Post-op Assessment: Report given to PACU RN and Post -op Vital signs reviewed and stable  Post vital signs: Reviewed and stable  Complications: No apparent anesthesia complications

## 2011-12-21 NOTE — H&P (Signed)
History of Present Illness  [name] [name] is a 71 year old previously followed by Dr. Gaynelle Arabian.  He has the following urologic history:  1) Bladder cancer: He was diagnosed with a low-grade Ta urothelial carcinoma of the bladder in 2007 and was treated with a TURBT.  Oct 2007: TURBT, Low grade Ta  2) Nephrolithiasis: He has a long standing history of calcium oxalate stones.  3) Prostate cancer screening:  Last PSA: 0.46 (June 2012)  4) BPH/LUTS: He is treated with Avodart.  Interval history:  He follows up today for bladder cancer surveillance and prostate cancer screening. He denies any hematuria or change in his voiding symptoms over the past year. He denies any recent flank pain or passage of kidney stones over the past year. He continues to take Avodart with good control of his urinary symptoms are unchanged from one year ago.   Past Medical History Problems  1. Bladder Cancer 188.9 2. History of  Diabetes Mellitus 250.00 3. History of  Hypertension 401.9 4. History of  Nephrolithiasis V13.01  Surgical History Problems  1. History of  Cystoscopy With Fulguration 2. History of  Cystoscopy With Insertion Of Ureteral Stent Left 3. History of  Cystoscopy With Insertion Of Ureteral Stent Right 4. History of  Cystoscopy With Insertion Of Ureteral Stent Right 5. History of  Cystoscopy With Pyeloscopy With Lithotripsy 6. History of  Cystoscopy With Ureteroscopy With Removal Of Calculus  Current Meds 1. Actos 30 MG Oral Tablet; Therapy: (Recorded:29Nov2007) to 2. Avodart 0.5 MG Oral Capsule; Take 1 capsule by mouth once daily; Therapy: 10Feb2011 to  (Evaluate:10Jun2013)  Requested for: 15Jun2012; Last Rx:15Jun2012 3. Fish Oil CAPS; Therapy: (Recorded:16Feb2010) to 4. Hydrochlorothiazide 25 MG Oral Tablet; Therapy: (Recorded:29Nov2007) to 5. Onglyza 5 MG Oral Tablet; Therapy: 23May2012 to 6. Perindopril Erbumine 4 MG Oral Tablet; Therapy: (Recorded:15Jun2012) to 7. Potassium  Citrate ER 10 MEQ (1080 MG) Oral Tablet Extended Release; take 1 tablet by mouth  three times a day; Therapy: 15Mar2011 to (Evaluate:13Oct2012)  Requested for: 15Jun2012; Last  Rx:15Jun2012 8. Pravastatin Sodium 80 MG Oral Tablet; Therapy: 04Jan2012 to 9. TRUEtest Test In Vitro Strip; Therapy: 09Mar2011 to 10. Vitamin B Complex TABS; Therapy: (Recorded:16Feb2010) to 11. Vitamin B12 TABS; Therapy: (Recorded:16Feb2010) to 12. Vitamin C TABS; Therapy: (Recorded:16Feb2010) to 13. Vitamin E TABS; Therapy: (Recorded:16Feb2010) to 14. Zymaxid 0.5 % Ophthalmic Solution; Therapy: 01Mar2011 to  Allergies Medication  1. Tetracyclines  Family History Denied  1. Family history of  Prostate Cancer  Social History Problems  1. Never A Smoker  Review of Systems  Genitourinary: no hematuria.  Gastrointestinal: no nausea.  Constitutional: no fever and no recent weight loss.  Cardiovascular: no chest pain and no leg swelling.    Vitals Vital Signs [Data Includes: Last 1 Day]  12Jun2013 11:04AM  BMI Calculated: 29.4 BSA Calculated: 2.15 Weight: 210 lb  Blood Pressure: 120 / 65 Temperature: 98 F Heart Rate: 88  Physical Exam Constitutional: Well nourished and well developed . No acute distress.  ENT:. The ears and nose are normal in appearance.  Neck: The appearance of the neck is normal and no neck mass is present.  Pulmonary: No respiratory distress, normal respiratory rhythm and effort and clear bilateral breath sounds.  Cardiovascular: Heart rate and rhythm are normal . No peripheral edema.  Abdomen: The abdomen is soft and nontender. No CVA tenderness.  Rectal: Rectal exam demonstrates normal sphincter tone, no tenderness and no masses. Prostate size is estimated to be 50 g. The prostate  has no nodularity and is not tender. The left seminal vesicle is nonpalpable. The right seminal vesicle is nonpalpable. The perineum is normal on inspection.  Genitourinary: Examination of the penis  demonstrates no discharge, no masses, no lesions and a normal meatus. The scrotum is without lesions. The right epididymis is palpably normal and non-tender. The left epididymis is palpably normal and non-tender. The right testis is non-tender and without masses. The left testis is non-tender and without masses.  Lymphatics: The femoral and inguinal nodes are not enlarged or tender.  Skin: Normal skin turgor, no visible rash and no visible skin lesions.  Neuro/Psych:. Mood and affect are appropriate.    Results/Data Urine [Data Includes: Last 1 Day]   12Jun2013  COLOR YELLOW   APPEARANCE CLEAR   SPECIFIC GRAVITY 1.030   pH 5.5   GLUCOSE > 1000 mg/dL  BILIRUBIN NEG   KETONE NEG mg/dL  BLOOD MOD   PROTEIN 30 mg/dL  UROBILINOGEN 0.2 mg/dL  NITRITE NEG   LEUKOCYTE ESTERASE NEG   SQUAMOUS EPITHELIAL/HPF FEW   WBC 4-6 WBC/hpf  RBC 7-10 RBC/hpf  BACTERIA NONE SEEN   CRYSTALS NONE SEEN   CASTS NONE SEEN   Other MUCUS   Selected Results  PSA REFLEX TO FREE 05Jun2013 11:58AM Heloise Purpura  SPECIMEN TYPE: BLOODRECALL ENTERED IN PM SYSTM   Test Name Result Flag Reference  PSA 0.42 ng/mL  <=4.00  Test Methodology: ECLIA PSA (Electrochemiluminescence Immunoassay)   For PSA values from 2.5-4.0, particularly in younger men <83 years old, the AUA and NCCN suggest testing for % Free PSA (3515) and evaluation of the rate of increase in PSA (PSA velocity).     PVR: 56 cc  I independently reviewed his KUB x-ray today. This does demonstrate a large calcification measuring 2.5 x 2.3 cm overlying the left renal pelvis possibly concerning for a renal calculus.  Procedure  Procedure: Cystoscopy   Indication: History of Urothelial Carcinoma.  Informed Consent: Risks, benefits, and potential adverse events were discussed and informed consent was obtained from the patient.  Prep: The patient was prepped with betadine.  Anesthesia:. Local anesthesia was administered intraurethrally with 2%  lidocaine jelly.  Antibiotic prophylaxis: Ciprofloxacin.  Procedure Note:  Urethral meatus:. No abnormalities.  Anterior urethra: No abnormalities.  Prostatic urethra:. The lateral prostatic lobes were enlarged.  Bladder: Visulization was clear. The ureteral orifices were in the normal anatomic position bilaterally and had clear efflux of urine. A systematic examination of the bladder was performed. No bladder stones were identified. He did have a small papillary, low-grade appearing tumor measuring about 1 cm just to the right of the right ureteral orifice. A saline bladder washing was obtained and sent for cytologic analysis. The patient tolerated the procedure well.  Complications: None.    Assessment Assessed  1. Bladder Cancer 188.9 2. Nephrolithiasis 592.0 3. Visit For: Screening Exam Malignant Neoplasm Prostate V76.44 4. Benign Prostatic Hyperplasia Localized With Urinary Obstruction With Other Lower Urinary Tract  Symptoms 600.21  Plan Bladder Cancer (188.9)  1. Follow-up Schedule Surgery Office  Follow-up  Done: 12Jun2013 2. URINE CYTOLOGY  Done: 12Jun2013 Health Maintenance (V70.0)  3. Avodart 0.5 MG Oral Capsule; Take 1 capsule by mouth once daily; Therapy: 10Feb2011 to  (Evaluate:07Jun2014)  Requested for: 15Jun2012; Last Rx:12Jun2013 4. UA With REFLEX  Done: 12Jun2013 10:31AM Nephrolithiasis (592.0)  5. Potassium Citrate ER 10 MEQ (1080 MG) Oral Tablet Extended Release; take 1 tablet by mouth  three times a day; Therapy: 15Mar2011 to (Evaluate:10Oct2013)  Requested for: 12Jun2013; Last  Rx:12Jun2013 Unlinked  6. Perindopril Erbumine 4 MG Oral Tablet; Status: DISCONTINUED 7. Pravastatin Sodium 80 MG Oral Tablet; Therapy: 04Jan2012-12Jun2013; Status:  DISCONTINUED 8. Zymaxid 0.5 % Ophthalmic Solution; Therapy: 01Mar2011-12Jun2013; Status: DISCONTINUED  Discussion/Summary  1. Urothelial carcinoma: He does appear to have a recurrence most likely related to a low-grade  urothelial carcinoma. I recommended proceeding with cystoscopy, bilateral retrograde pyelography, and transurethral resection of this bladder tumor. Based on its close proximity to the right ureteral orifice, he may require a right ureteral stent during this procedure. I have reviewed the potential risks, complications, and alternative treatment options as well as the expected recovery process. He agrees to proceed and gives his informed consent today. We also discussed the possibility of intravesical mitomycin postoperatively. Based on the fact that he has had infrequent recurrences and small low-grade solitary tumors, we have elected to hold off on this treatment after transurethral resection.  2. Urolithiasis with probable large left renal calculus: This will be further evaluated on his upcoming retrograde pyelogram. If this is confirmed to represent a true renal calculus, he would be best treated with a percutaneous nephrostolithotomy. We will discuss this further following treatment of his bladder tumor. His prescription for Urocit-K has been refilled in the meantime.  3. BPH/LUTS: His description for Avodart has been refilled.  4. Prostate cancer screening: He remains at low risk for prostate cancer.  Cc: Dr. Adela Lank     Verified Results URINE CYTOLOGY1 12Jun2013 12:04PM1 Lyda Perone  SPECIMEN TYPE: OTHER   Test Name Result Flag Reference  FINAL DIAGNOSIS:1     - NO MALIGNANT CELLS IDENTIFIED. - BENIGN UROTHELIAL CELLS ARE PRESENT.  SOURCE:1 Bladder Washing1    110CC CLEAR YELLOW FLUID BLW 1 SLIDE PREPARED EM [id number]  Relevant Clinical Info1 BLADDER CANCER1    PATHOLOGIST:1     Reviewed by Margaretha Glassing. Fields, MD, FCAP Proofreader on File)  1 Container Submitted  CYTOTECHNOLOGIST:1     Ethelle Lyon, MS CT (ASCP)     1. Amended By: Heloise Purpura; 11/26/2011 4:54 PMEST  Signatures Electronically signed by : Heloise Purpura, M.D.; Nov 26 2011   4:54PM

## 2011-12-21 NOTE — Interval H&P Note (Signed)
History and Physical Interval Note:  12/21/2011 8:05 AM  [name] [name]  has presented today for surgery, with the diagnosis of BLADDER CANCER  The various methods of treatment have been discussed with the patient and family. After consideration of risks, benefits and other options for treatment, the patient has consented to  Procedure(s) (LRB): TRANSURETHRAL RESECTION OF BLADDER TUMOR (TURBT) (N/A) CYSTOSCOPY WITH RETROGRADE PYELOGRAM (Bilateral) POSSIBLE STENT PLACEMENT (Right) as a surgical intervention .  The patient's history has been reviewed, patient examined, no change in status, stable for surgery.  I have reviewed the patients' chart and labs.  Questions were answered to the patient's satisfaction.     [name] [name],LES

## 2011-12-23 ENCOUNTER — Encounter (HOSPITAL_COMMUNITY): Payer: Self-pay | Admitting: Urology

## 2011-12-24 DIAGNOSIS — N390 Urinary tract infection, site not specified: Secondary | ICD-10-CM | POA: Diagnosis not present

## 2011-12-24 DIAGNOSIS — I10 Essential (primary) hypertension: Secondary | ICD-10-CM | POA: Diagnosis not present

## 2011-12-24 DIAGNOSIS — E789 Disorder of lipoprotein metabolism, unspecified: Secondary | ICD-10-CM | POA: Diagnosis not present

## 2011-12-29 DIAGNOSIS — Z85828 Personal history of other malignant neoplasm of skin: Secondary | ICD-10-CM | POA: Diagnosis not present

## 2011-12-29 DIAGNOSIS — L570 Actinic keratosis: Secondary | ICD-10-CM | POA: Diagnosis not present

## 2011-12-30 DIAGNOSIS — R030 Elevated blood-pressure reading, without diagnosis of hypertension: Secondary | ICD-10-CM | POA: Diagnosis not present

## 2011-12-30 DIAGNOSIS — D72829 Elevated white blood cell count, unspecified: Secondary | ICD-10-CM | POA: Diagnosis not present

## 2011-12-30 DIAGNOSIS — J438 Other emphysema: Secondary | ICD-10-CM | POA: Diagnosis not present

## 2011-12-30 DIAGNOSIS — E119 Type 2 diabetes mellitus without complications: Secondary | ICD-10-CM | POA: Diagnosis not present

## 2012-01-06 DIAGNOSIS — C679 Malignant neoplasm of bladder, unspecified: Secondary | ICD-10-CM | POA: Diagnosis not present

## 2012-01-06 DIAGNOSIS — N390 Urinary tract infection, site not specified: Secondary | ICD-10-CM | POA: Diagnosis not present

## 2012-02-01 DIAGNOSIS — D72829 Elevated white blood cell count, unspecified: Secondary | ICD-10-CM | POA: Diagnosis not present

## 2012-02-29 DIAGNOSIS — Z23 Encounter for immunization: Secondary | ICD-10-CM | POA: Diagnosis not present

## 2012-03-03 DIAGNOSIS — D72829 Elevated white blood cell count, unspecified: Secondary | ICD-10-CM | POA: Diagnosis not present

## 2012-03-09 DIAGNOSIS — E119 Type 2 diabetes mellitus without complications: Secondary | ICD-10-CM | POA: Diagnosis not present

## 2012-03-09 DIAGNOSIS — C679 Malignant neoplasm of bladder, unspecified: Secondary | ICD-10-CM | POA: Diagnosis not present

## 2012-03-09 DIAGNOSIS — E789 Disorder of lipoprotein metabolism, unspecified: Secondary | ICD-10-CM | POA: Diagnosis not present

## 2012-03-09 DIAGNOSIS — I10 Essential (primary) hypertension: Secondary | ICD-10-CM | POA: Diagnosis not present

## 2012-04-27 DIAGNOSIS — R82998 Other abnormal findings in urine: Secondary | ICD-10-CM | POA: Diagnosis not present

## 2012-04-27 DIAGNOSIS — Z961 Presence of intraocular lens: Secondary | ICD-10-CM | POA: Diagnosis not present

## 2012-04-27 DIAGNOSIS — C679 Malignant neoplasm of bladder, unspecified: Secondary | ICD-10-CM | POA: Diagnosis not present

## 2012-04-27 DIAGNOSIS — N401 Enlarged prostate with lower urinary tract symptoms: Secondary | ICD-10-CM | POA: Diagnosis not present

## 2012-04-27 DIAGNOSIS — E119 Type 2 diabetes mellitus without complications: Secondary | ICD-10-CM | POA: Diagnosis not present

## 2012-07-04 DIAGNOSIS — I10 Essential (primary) hypertension: Secondary | ICD-10-CM | POA: Diagnosis not present

## 2012-07-04 DIAGNOSIS — E789 Disorder of lipoprotein metabolism, unspecified: Secondary | ICD-10-CM | POA: Diagnosis not present

## 2012-07-11 DIAGNOSIS — I10 Essential (primary) hypertension: Secondary | ICD-10-CM | POA: Diagnosis not present

## 2012-07-11 DIAGNOSIS — E119 Type 2 diabetes mellitus without complications: Secondary | ICD-10-CM | POA: Diagnosis not present

## 2012-07-11 DIAGNOSIS — R413 Other amnesia: Secondary | ICD-10-CM | POA: Diagnosis not present

## 2012-07-13 ENCOUNTER — Other Ambulatory Visit: Payer: Self-pay | Admitting: Endocrinology

## 2012-07-13 DIAGNOSIS — R413 Other amnesia: Secondary | ICD-10-CM

## 2012-07-26 ENCOUNTER — Other Ambulatory Visit: Payer: Federal, State, Local not specified - PPO

## 2012-07-26 DIAGNOSIS — I10 Essential (primary) hypertension: Secondary | ICD-10-CM | POA: Diagnosis not present

## 2012-07-26 DIAGNOSIS — R413 Other amnesia: Secondary | ICD-10-CM | POA: Diagnosis not present

## 2012-07-26 DIAGNOSIS — E119 Type 2 diabetes mellitus without complications: Secondary | ICD-10-CM | POA: Diagnosis not present

## 2012-08-01 ENCOUNTER — Other Ambulatory Visit: Payer: Self-pay | Admitting: Neurology

## 2012-08-01 DIAGNOSIS — R413 Other amnesia: Secondary | ICD-10-CM

## 2012-08-01 DIAGNOSIS — I10 Essential (primary) hypertension: Secondary | ICD-10-CM

## 2012-08-01 DIAGNOSIS — E119 Type 2 diabetes mellitus without complications: Secondary | ICD-10-CM

## 2012-08-08 ENCOUNTER — Ambulatory Visit
Admission: RE | Admit: 2012-08-08 | Discharge: 2012-08-08 | Disposition: A | Discharge status: Home / Self Care | Payer: Medicare Other | Source: Ambulatory Visit | Attending: Neurology | Admitting: Neurology

## 2012-08-08 DIAGNOSIS — I10 Essential (primary) hypertension: Secondary | ICD-10-CM

## 2012-08-16 ENCOUNTER — Other Ambulatory Visit (HOSPITAL_COMMUNITY): Payer: Self-pay | Admitting: Neurology

## 2012-08-18 ENCOUNTER — Ambulatory Visit (HOSPITAL_COMMUNITY): Payer: Medicare Other | Attending: Cardiology

## 2012-08-18 ENCOUNTER — Ambulatory Visit (INDEPENDENT_AMBULATORY_CARE_PROVIDER_SITE_OTHER): Payer: Medicare Other | Admitting: *Deleted

## 2012-08-18 VITALS — BP 156/82 | HR 87 | Resp 12 | Ht 71.0 in | Wt 209.0 lb

## 2012-08-18 DIAGNOSIS — E785 Hyperlipidemia, unspecified: Secondary | ICD-10-CM | POA: Diagnosis not present

## 2012-08-18 DIAGNOSIS — I10 Essential (primary) hypertension: Secondary | ICD-10-CM | POA: Diagnosis not present

## 2012-08-18 DIAGNOSIS — R413 Other amnesia: Secondary | ICD-10-CM | POA: Diagnosis not present

## 2012-08-18 DIAGNOSIS — E119 Type 2 diabetes mellitus without complications: Secondary | ICD-10-CM | POA: Insufficient documentation

## 2012-08-18 NOTE — Progress Notes (Signed)
Pt arrived for ekg and echo per orders Dr  Levert Feinstein.  EKG was  NSR and reviewed by DOD Dr Riley Kill.  Pt ambulated to room, bp taken on left arm manually, pt agitated to be here for testing. Returned to wait area for next test/ echo.

## 2012-08-18 NOTE — Progress Notes (Signed)
Echocardiogram performed.  

## 2012-08-22 ENCOUNTER — Encounter (HOSPITAL_COMMUNITY): Payer: Self-pay | Admitting: Neurology

## 2012-09-02 DIAGNOSIS — N390 Urinary tract infection, site not specified: Secondary | ICD-10-CM | POA: Diagnosis not present

## 2012-09-02 DIAGNOSIS — C679 Malignant neoplasm of bladder, unspecified: Secondary | ICD-10-CM | POA: Diagnosis not present

## 2012-09-02 DIAGNOSIS — R82998 Other abnormal findings in urine: Secondary | ICD-10-CM | POA: Diagnosis not present

## 2012-11-17 DIAGNOSIS — I10 Essential (primary) hypertension: Secondary | ICD-10-CM | POA: Diagnosis not present

## 2012-11-17 DIAGNOSIS — N400 Enlarged prostate without lower urinary tract symptoms: Secondary | ICD-10-CM | POA: Diagnosis not present

## 2012-11-17 DIAGNOSIS — E119 Type 2 diabetes mellitus without complications: Secondary | ICD-10-CM | POA: Diagnosis not present

## 2012-11-23 DIAGNOSIS — N401 Enlarged prostate with lower urinary tract symptoms: Secondary | ICD-10-CM | POA: Diagnosis not present

## 2012-11-30 DIAGNOSIS — C679 Malignant neoplasm of bladder, unspecified: Secondary | ICD-10-CM | POA: Diagnosis not present

## 2012-11-30 DIAGNOSIS — N401 Enlarged prostate with lower urinary tract symptoms: Secondary | ICD-10-CM | POA: Diagnosis not present

## 2012-11-30 DIAGNOSIS — Z125 Encounter for screening for malignant neoplasm of prostate: Secondary | ICD-10-CM | POA: Diagnosis not present

## 2012-11-30 DIAGNOSIS — N390 Urinary tract infection, site not specified: Secondary | ICD-10-CM | POA: Diagnosis not present

## 2012-12-02 DIAGNOSIS — N390 Urinary tract infection, site not specified: Secondary | ICD-10-CM | POA: Diagnosis not present

## 2012-12-02 DIAGNOSIS — C679 Malignant neoplasm of bladder, unspecified: Secondary | ICD-10-CM | POA: Diagnosis not present

## 2013-02-01 DIAGNOSIS — L578 Other skin changes due to chronic exposure to nonionizing radiation: Secondary | ICD-10-CM | POA: Diagnosis not present

## 2013-02-01 DIAGNOSIS — Z85828 Personal history of other malignant neoplasm of skin: Secondary | ICD-10-CM | POA: Diagnosis not present

## 2013-03-01 ENCOUNTER — Ambulatory Visit (INDEPENDENT_AMBULATORY_CARE_PROVIDER_SITE_OTHER): Payer: Medicare Other | Admitting: Nurse Practitioner

## 2013-03-01 ENCOUNTER — Encounter: Payer: Self-pay | Admitting: Nurse Practitioner

## 2013-03-01 VITALS — BP 140/85 | HR 82 | Ht 71.0 in | Wt 191.0 lb

## 2013-03-01 DIAGNOSIS — I10 Essential (primary) hypertension: Secondary | ICD-10-CM

## 2013-03-01 DIAGNOSIS — R413 Other amnesia: Secondary | ICD-10-CM | POA: Diagnosis not present

## 2013-03-01 DIAGNOSIS — E119 Type 2 diabetes mellitus without complications: Secondary | ICD-10-CM | POA: Diagnosis not present

## 2013-03-01 MED ORDER — MEMANTINE HCL ER 28 MG PO CP24: 28.0000 mg | ORAL_CAPSULE | Freq: Every day | ORAL | Status: DC

## 2013-03-01 NOTE — Patient Instructions (Addendum)
Continue Namenda 28 mg XR daily Rx for 1 free month with coupon F/U in 6 months

## 2013-03-01 NOTE — Progress Notes (Signed)
[facility]  PATIENT: [name] [name] DOB: [birth year]   REASON FOR VISIT: Follow  up for memory   HISTORY OF PRESENT ILLNESS: [name] [name]  72 years old right-handed Caucasian male, accompanied by his wife, returns for followup. He was last seen by Dr. Terrace Arabia 08/23/2012. He was placed on Aricept at that time along with his Namenda however the medication was stopped due to nausea and vomiting. He has recently had metoprolol added for his HTN. Wife reports that his appetite is fair, sleeping well at night, no wandering behavior, the patient remains easily frustrated and says "I do not want to be here". The wife claims he still drives short distances in familiar places, he does not drive alone and she is not afraid to ride with him. No new neurologic complaints   Updated March 2014, he was referred for cardiac evaluation because of tachycardia, EKG showed normal sinus rhythm at 87 beats per minute, echocardiogram was normal, MRI of the brain showed mild perisylvian atrophy, moderate prominence of the subarachnoid space over the bifrontal, and the biparietal region, mild periventricular nonspecific goliosis. Laboratory showed mildly low vitamin B12 256, normal methylmalonic acid level, folic acid, homocystine level, he is on by mouth vitamin B12 supplement  He is very frustrated,  [name] [name]  72 years old right-handed Caucasian male, accompanied by his wife, referred by his primary care physician Dr. 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, this He gets frustrated and angry easily, there was no significant visual difficulty, no gait difficulty, he still driving short distance, not getting loss, Laboratory evaluation July 04 2012, A1c 8.4, LDL 90, normal CBC, CMP with exception of elevated glucose 216, vitamin B12 254 his father has Alzheimer's disease, his first cousin also has Alzheimer's disease   REVIEW OF SYSTEMS: Full 14 system review of systems performed and notable only for:  Constitutional: N/A  Cardiovascular: N/A  Ear/Nose/Throat: N/A  Skin: N/A  Eyes: N/A  Respiratory: N/A  Gastroitestinal: N/A  Hematology/Lymphatic: N/A  Endocrine: N/A Musculoskeletal:N/A  Allergy/Immunology: N/A  Neurological: Memory loss  Psychiatric: N/A   ALLERGIES: Allergies  Allergen Reactions  . Tetracyclines & Related Rash  . Aricept [Donepezil Hcl]     Nausea and vomiting  . Sulfa Antibiotics Other (See Comments)    unknown    HOME MEDICATIONS: Outpatient Prescriptions Prior to Visit  Medication Sig Dispense Refill  . B Complex-C (B-COMPLEX WITH VITAMIN C) tablet Take 1 tablet by mouth daily.      [name] dutasteride (AVODART) 0.5 MG capsule Take 0.5 mg by mouth every morning.      . hydrochlorothiazide (HYDRODIURIL) 25 MG tablet Take 25 mg by mouth every morning.      . metFORMIN (GLUCOPHAGE-XR) 500 MG 24 hr tablet Take 1,000 mg by mouth 2 (two) times daily.      . perindopril (ACEON) 4 MG tablet Take 4 mg by mouth every morning.      [name]  potassium citrate (UROCIT-K) 10 MEQ (1080 MG) SR tablet Take 10 mEq by mouth 3 (three) times daily with meals.      . saxagliptin HCl (ONGLYZA) 5 MG TABS tablet Take 5 mg by mouth daily.      . Ascorbic Acid (VITAMIN C) 1000 MG tablet Take 1,000 mg by mouth daily.      . Hydrocodone-Acetaminophen 5-300 MG TABS Take 1-2 tablets by mouth every 6 (six) hours.  25 each  0   No facility-administered  medications prior to visit.    PAST MEDICAL HISTORY: Past Medical History  Diagnosis Date  . Diabetes mellitus 12-11-11    oral meds only  . Hypertension 12-11-11    controlled with meds  . Cancer 12-11-11    Bladder cancer -dx. '07-surgery for tumor removal   / skin cancer  . Dementia 12-11-11    memory short term(steadily progressive worsening-pt. unaware)    PAST SURGICAL HISTORY: Past Surgical History  Procedure Laterality Date  . Cataract extraction, bilateral  12-11-11     bil.   . Bladder surgery  12-11-11    2007-tumor removal -stent placed and removed.Francine Graven  '08 for stone  . Lithotripsy  12-11-11    '05/ '10  . Subdural hemorrhage drainage  12-11-11    drainage per boreholes only 12-12-03  . Tonsillectomy  12-11-11    child  . Transurethral resection of bladder tumor  12/21/2011    Procedure: TRANSURETHRAL RESECTION OF BLADDER TUMOR (TURBT);  Surgeon: Crecencio Mc, MD;  Location: [facility];  Service: Urology;  Laterality: N/A;     . Cystoscopy w/ retrogrades  12/21/2011    Procedure: CYSTOSCOPY WITH RETROGRADE PYELOGRAM;  Surgeon: Crecencio Mc, MD;  Location: [facility];  Service: Urology;  Laterality: Bilateral;  . Cystoscopy with biopsy  12/21/2011    Procedure: CYSTOSCOPY WITH BIOPSY;  Surgeon: Crecencio Mc, MD;  Location: [facility];  Service: Urology;;    FAMILY HISTORY: History reviewed. No pertinent family history.  SOCIAL HISTORY: History   Social History  . Marital Status: Married    Spouse Name: Amil Amen    Number of Children: 2  . Years of Education: N/A   Occupational History  . retired.   Social History Main Topics  . Smoking status: Never Smoker   . Smokeless tobacco: Never Used  . Alcohol Use: No  . Drug Use: No  . Sexual Activity: Not on file   Other Topics Concern  . Not on file   Social History Narrative  . No narrative on file     PHYSICAL EXAM  Filed Vitals:   03/01/13 1015  BP: 140/85  Pulse: 82  Height: 5\' 11"  (1.803 m)  Weight: 191 lb (86.637  kg)   Body mass index is 26.65 kg/(m^2).  Generalized: Well developed, in no acute distress  Head: normocephalic and atraumatic,. Oropharynx benign  Neck: Supple, no carotid bruits  Cardiac: Regular rate rhythm, no murmur  Musculoskeletal: No deformity   Neurological examination   Mentation: Alert  MMSE 26/30 missing items in orientation and 1 of 3 recall.AFT 12.  Follows all commands speech and language fluent  Cranial nerve II-XII: Fundoscopic exam reveals sharp disc margins.Pupils were equal round reactive to light extraocular movements were full, visual field were full on confrontational test. Facial sensation and strength were normal. hearing was intact to finger rubbing bilaterally. Uvula tongue midline. head turning and shoulder shrug and were normal and symmetric.Tongue protrusion into cheek strength was normal. Motor: normal bulk and tone,  full strength in the BUE, BLE, fine finger movements normal, no pronator drift. No focal weakness Coordination: finger-nose-finger, heel-to-shin bilaterally, no dysmetria Reflexes: Brachioradialis 2/2, biceps 2/2, triceps 2/2, patellar 2/2, Achilles 2/2, plantar responses were flexor bilaterally. Gait and Station: Rising up from seated position without assistance, normal stance, moderate stride, good arm swing, smooth turning, able to perform tiptoe, and heel walking without difficulty.   DIAGNOSTIC DATA (LABS, IMAGING, TESTING) -None to review   ASSESSMENT AND PLAN  72 y.o. year old male  has a past medical history of Diabetes mellitus (12-11-11); Hypertension (12-11-11); Cancer (12-11-11); and Dementia (12-11-11). here for followup. He was unable to tolerate Aricept do to nausea and vomiting. He remains on Namenda XR 28 mg daily.  Continue Namenda 28 mg XR daily Rx for 1 free month with coupon F/U in 6 months  Nilda Riggs, [facility], [facility], APRN  [facility] [address], [address] [place], [place] [address] [phone]

## 2013-03-09 DIAGNOSIS — C679 Malignant neoplasm of bladder, unspecified: Secondary | ICD-10-CM | POA: Diagnosis not present

## 2013-03-09 DIAGNOSIS — R82998 Other abnormal findings in urine: Secondary | ICD-10-CM | POA: Diagnosis not present

## 2013-03-13 DIAGNOSIS — R000 Tachycardia, unspecified: Secondary | ICD-10-CM | POA: Diagnosis not present

## 2013-03-13 DIAGNOSIS — E789 Disorder of lipoprotein metabolism, unspecified: Secondary | ICD-10-CM | POA: Diagnosis not present

## 2013-03-13 DIAGNOSIS — R413 Other amnesia: Secondary | ICD-10-CM | POA: Diagnosis not present

## 2013-03-20 DIAGNOSIS — E789 Disorder of lipoprotein metabolism, unspecified: Secondary | ICD-10-CM | POA: Diagnosis not present

## 2013-03-20 DIAGNOSIS — I10 Essential (primary) hypertension: Secondary | ICD-10-CM | POA: Diagnosis not present

## 2013-05-03 DIAGNOSIS — Z961 Presence of intraocular lens: Secondary | ICD-10-CM | POA: Diagnosis not present

## 2013-05-03 DIAGNOSIS — E119 Type 2 diabetes mellitus without complications: Secondary | ICD-10-CM | POA: Diagnosis not present

## 2013-06-20 DIAGNOSIS — I10 Essential (primary) hypertension: Secondary | ICD-10-CM | POA: Diagnosis not present

## 2013-06-20 DIAGNOSIS — N400 Enlarged prostate without lower urinary tract symptoms: Secondary | ICD-10-CM | POA: Diagnosis not present

## 2013-06-20 DIAGNOSIS — E119 Type 2 diabetes mellitus without complications: Secondary | ICD-10-CM | POA: Diagnosis not present

## 2013-07-17 DIAGNOSIS — L570 Actinic keratosis: Secondary | ICD-10-CM | POA: Diagnosis not present

## 2013-07-26 DIAGNOSIS — N139 Obstructive and reflux uropathy, unspecified: Secondary | ICD-10-CM | POA: Diagnosis not present

## 2013-07-26 DIAGNOSIS — C679 Malignant neoplasm of bladder, unspecified: Secondary | ICD-10-CM | POA: Diagnosis not present

## 2013-07-26 DIAGNOSIS — R82998 Other abnormal findings in urine: Secondary | ICD-10-CM | POA: Diagnosis not present

## 2013-07-26 DIAGNOSIS — N401 Enlarged prostate with lower urinary tract symptoms: Secondary | ICD-10-CM | POA: Diagnosis not present

## 2013-07-28 ENCOUNTER — Other Ambulatory Visit: Payer: Self-pay | Admitting: Urology

## 2013-07-31 ENCOUNTER — Other Ambulatory Visit: Payer: Self-pay | Admitting: Urology

## 2013-08-02 ENCOUNTER — Encounter (HOSPITAL_COMMUNITY): Payer: Self-pay | Admitting: Pharmacy Technician

## 2013-08-07 ENCOUNTER — Other Ambulatory Visit (HOSPITAL_COMMUNITY): Payer: Self-pay | Admitting: Urology

## 2013-08-08 ENCOUNTER — Inpatient Hospital Stay (HOSPITAL_COMMUNITY): Admission: RE | Admit: 2013-08-08 | Payer: Medicare Other | Source: Ambulatory Visit

## 2013-08-09 ENCOUNTER — Encounter (HOSPITAL_COMMUNITY): Payer: Self-pay

## 2013-08-09 ENCOUNTER — Ambulatory Visit (HOSPITAL_COMMUNITY)
Admission: RE | Admit: 2013-08-09 | Discharge: 2013-08-09 | Disposition: A | Discharge status: Home / Self Care | Payer: Medicare Other | Source: Ambulatory Visit | Attending: Urology | Admitting: Urology

## 2013-08-09 ENCOUNTER — Encounter (HOSPITAL_COMMUNITY)
Admission: RE | Admit: 2013-08-09 | Discharge: 2013-08-09 | Disposition: A | Discharge status: Home / Self Care | Payer: Medicare Other | Source: Ambulatory Visit | Attending: Urology | Admitting: Urology

## 2013-08-09 DIAGNOSIS — C679 Malignant neoplasm of bladder, unspecified: Secondary | ICD-10-CM | POA: Insufficient documentation

## 2013-08-09 DIAGNOSIS — N390 Urinary tract infection, site not specified: Secondary | ICD-10-CM

## 2013-08-09 DIAGNOSIS — Z01812 Encounter for preprocedural laboratory examination: Secondary | ICD-10-CM | POA: Diagnosis not present

## 2013-08-09 DIAGNOSIS — Z01818 Encounter for other preprocedural examination: Secondary | ICD-10-CM | POA: Diagnosis not present

## 2013-08-09 DIAGNOSIS — E119 Type 2 diabetes mellitus without complications: Secondary | ICD-10-CM | POA: Insufficient documentation

## 2013-08-09 HISTORY — DX: Unspecified hearing loss, unspecified ear: H91.90

## 2013-08-09 HISTORY — DX: Urinary tract infection, site not specified: N39.0

## 2013-08-09 HISTORY — DX: Pure hypercholesterolemia, unspecified: E78.00

## 2013-08-09 LAB — BASIC METABOLIC PANEL
BUN: 18 mg/dL (ref 6–23)
CALCIUM: 9.7 mg/dL (ref 8.4–10.5)
CO2: 27 mEq/L (ref 19–32)
Chloride: 99 mEq/L (ref 96–112)
Creatinine, Ser: 1.01 mg/dL (ref 0.50–1.35)
GFR, EST AFRICAN AMERICAN: 85 mL/min — AB (ref >90)
GFR, EST NON AFRICAN AMERICAN: 73 mL/min — AB (ref >90)
GLUCOSE: 215 mg/dL — AB (ref 70–99)
POTASSIUM: 4.3 meq/L (ref 3.7–5.3)
SODIUM: 140 meq/L (ref 137–147)

## 2013-08-09 NOTE — Patient Instructions (Signed)
[place]  08/09/2013   Your procedure is scheduled on:   08-14-2013  Report to [place] at      2:00  PM.  Call this number if you have problems the morning of surgery: [phone]  Or Presurgical Testing [phone](Zionah Criswell) For Living Will and/or Health Care Power Attorney Forms: please provide copy for your medical record,may bring AM of surgery(Forms should be already notarized -we do not provide this service).(08-09-13 Both Living will, HCPOA documents provided today.)    Do not eat food:After Midnight.  May have clear liquids:up to 6 Hours before arrival. Nothing after : 1000 AM  Clear liquids include soda, tea, black coffee, apple or grape juice, broth.  Take these medicines the morning of surgery with A SIP OF WATER: Avodart. Namenda. Metoprolol.   Do not wear jewelry, make-up or nail polish.  Do not wear lotions, powders, or perfumes. You may wear deodorant.  Do not shave 48 hours(2 days) prior to first CHG shower(legs and under arms).(Shaving face and neck okay.)  Do not bring valuables to the hospital.(Hospital is not responsible for lost valuables).  Contacts, dentures or removable bridgework, body piercing, hair pins may not be worn into surgery.  Leave suitcase in the car. After surgery it may be brought to your room.  For patients admitted to the hospital, checkout time is 11:00 AM the day of discharge.(Restricted visitors-Any Persons displaying flu-like symptoms or illness).    Patients discharged the day of surgery will not be allowed to drive home. Must have responsible person with you x 24 hours once discharged.  Name and phone number of your driver: Kennard Fildes [phone] cell  Special Instructions: CHG(Chlorhedine 4%-"Hibiclens","Betasept","Aplicare") Shower Use Special Wash: see special instructions.(avoid face and genitals)   Please read over the following fact sheets that you were given: MRSA Information, Blood Transfusion fact  sheet, Incentive Spirometry Instruction.  Remember : Type/Screen "Blue armbands" - may not be removed once applied(would result in being retested AM of surgery, if removed).  Failure to follow these instructions may result in Cancellation of your surgery.   Patient signature_______________________________________________________

## 2013-08-10 NOTE — H&P (Signed)
History of Present Illness [name] [name] is a 73 year old previously followed by Dr. Tresa Endo. He has the following urologic history:  1) Bladder cancer: He was diagnosed with a low-grade Ta urothelial carcinoma of the bladder in 2007 and was treated with a TURBT. He was noted to have a recurrence in 2013 and underwent TURBT in July 2013. He was also incidentally noted to have a stricture of the distal left ureter on RPG studies at that time and a brush biopsy was performed.  Oct 2007: TURBT, Low grade Ta Jul 2013: TURBT (right bladder neck 1.5 cm tumor, low grade Ta - ), brush biopsy of distal left ureteral stricture (no malignancy)  Last cysto: Oct 2014 (Neg), cytology negative Last upper tract imaging: B RPGs (Jul 2013) - left ureteral stricture (did not appear to be obstructing)   2) Nephrolithiasis: He has a long standing history of calcium oxalate stones.  3) Prostate cancer screening:  Last PSA: 0.35 (June 2014)   4) BPH/LUTS: He is treated with Avodart.   Interval history:   [name] [name] follows up today for bladder cancer surveillance. He is not accompanied by his wife today. He denies any hematuria or change in his voiding habits. He specifically denies any dysuria, flank pain, or fever.     Past Medical History Problems  1. Bladder cancer (188.9) 2. History of diabetes mellitus (V12.29) 3. History of hypertension (V12.59) 4. History of kidney stones (V13.01)  Surgical History Problems  1. History of Cystoscopy With Fulguration 2. History of Cystoscopy With Fulguration Small Lesion (5-104mm) 3. History of Cystoscopy With Insertion Of Ureteral Stent Left 4. History of Cystoscopy With Insertion Of Ureteral Stent Right 5. History of Cystoscopy With Insertion Of Ureteral Stent Right 6. History of Cystoscopy With Pyeloscopy With Lithotripsy 7. History of Cystoscopy With Ureteroscopy With Removal Of Calculus  Current Meds 1. Avodart 0.5 MG Oral Capsule; Take 1 capsule  by mouth once daily;  Therapy: [id number] to (Evaluate:16Jul2015)  Requested for: 21Jul2014; Last  Rx:21Jul2014 Ordered 2. Fish Oil CAPS;  Therapy: (Recorded:16Feb2010) to Recorded 3. Fluorouracil 5 % External Cream;  Therapy: [id number] to Recorded 4. Hydrochlorothiazide 25 MG Oral Tablet;  Therapy: (Recorded:29Nov2007) to Recorded 5. MetFORMIN HCl - 500 MG Oral Tablet;  Therapy: (Recorded:05Mar2014) to Recorded 6. Namenda XR 28 MG Oral Capsule Extended Release 24 Hour;  Therapy: 27Feb2014 to Recorded 7. Onglyza 5 MG Oral Tablet;  Therapy: [id number] to Recorded 8. Perindopril Erbumine 4 MG Oral Tablet;  Therapy: (Recorded:05Mar2014) to Recorded 9. Potassium Citrate ER 10 MEQ (1080 MG) Oral Tablet Extended Release; take 1 tablet by  mouth three times a day;  Therapy: [id number] to (Evaluate:12Mar2015)  Requested for: [id number]; Last  Rx:17Mar2014 Ordered 10. Vitamin B Complex TABS;   Therapy: (Recorded:16Feb2010) to Recorded 11. Vitamin B12 TABS;   Therapy: (Recorded:16Feb2010) to Recorded 12. Vitamin C TABS;   Therapy: (Recorded:16Feb2010) to Recorded 13. Vitamin E TABS;   Therapy: (Recorded:16Feb2010) to Recorded  Allergies Medication  1. Nitrofurantoin Monohyd Macro CAPS 2. Tetracyclines  Family History Problems  1. Denied: Family history of Prostate Cancer  Social History Problems  1. Never A Smoker  Vitals Vital Signs [Data Includes: Last 1 Day]  Recorded: [id number] 02:05PM  Blood Pressure: 138 / 78 Temperature: 98.3 F Heart Rate: 94  Physical Exam Constitutional: Well nourished and well developed . No acute distress.  Pulmonary: No respiratory distress and normal respiratory rhythm and effort.  Cardiovascular: Heart rate and rhythm are normal . No peripheral edema.  Abdomen: The abdomen is soft and nontender. No masses are palpated. No CVA tenderness. No hernias are palpable. No hepatosplenomegaly noted.  Genitourinary: Examination of the penis demonstrates  no lesions and a normal meatus.    Results/Data Urine [Data Includes: Last 1 Day]   [id number]  COLOR YELLOW   APPEARANCE CLOUDY   SPECIFIC GRAVITY 1.020   pH 6.0   GLUCOSE 500 mg/dL  BILIRUBIN NEG   KETONE NEG mg/dL  BLOOD TRACE   PROTEIN NEG mg/dL  UROBILINOGEN 0.2 mg/dL  NITRITE POS   LEUKOCYTE ESTERASE MOD   SQUAMOUS EPITHELIAL/HPF NONE SEEN   WBC 11-20 WBC/hpf  RBC 11-20 RBC/hpf  BACTERIA MANY   CRYSTALS NONE SEEN   CASTS NONE SEEN    Urine has been cultured.    He has previously had asymptomatic bacteriuria with sometimes has been multiple bacteria. This has been recurrent despite antibiotic therapy and it is questionable whether this may be a contaminant although his urinalysis would not suggest this. We have previously decided to proceed with cystoscopy and simply treat him with antibiotics due to the difficulty in getting him to the office considering his dementia.    PVR: 21 cc   Procedure  Procedure: Cystoscopy  Chaperone Present: Heather S.  Indication: History of Urothelial Carcinoma.  Informed Consent: Risks, benefits, and potential adverse events were discussed and informed consent was obtained from the patient.  Prep: The patient was prepped with betadine.  Anesthesia:. Local anesthesia was administered intraurethrally with 2% lidocaine jelly.  Antibiotic prophylaxis: Trimethoprim/Sulfamethoxazole.  Procedure Note:  Urethral meatus:. No abnormalities.  Anterior urethra: No abnormalities.  Prostatic urethra:. The lateral prostatic lobes were enlarged.  Bladder: Visulization was clear. The ureteral orifices were in the normal anatomic position bilaterally and had clear efflux of urine. Systematic examination of the bladder revealed the ureteral orifices to be in their expected anatomic location and effluxing clear urine. There was moderate trabeculation throughout the bladder. There was noted be a small 1 cm papillary tumor located just at the left  hemitrigone just inside the bladder neck. No other bladder tumors or other abnormalities were identified. A saline bladder washing was obtained and sent for cytologic analysis. The patient tolerated the procedure well.  Complications: None.    Assessment Assessed  1. Bladder cancer (188.9) 2. Benign localized prostatic hyperplasia with lower urinary tract symptoms (LUTS)  (600.21,599.69) 3. Pyuria (791.9)  Plan Bladder cancer  1. URINE CYTOLOGY; Status:Hold For - Specimen/Data Collection,Appointment;  Requested for:11Feb2015;  2. Follow-up Schedule Surgery Office  Follow-up  Status: Complete  Done: [id number] Health Maintenance  3. UA With REFLEX; [Do Not Release]; Status:Complete;   Done: [id number] 01:38PM Pyuria  4. Start: Sulfamethoxazole-TMP DS 800-160 MG Oral Tablet (Bactrim DS); TAKE 1 TABLET  Every twelve hours  Discussion/Summary 1. Urothelial carcinoma: He does appear to have papillary recurrence near the bladder neck. I recommended that he be scheduled for cystoscopy with transurethral resection of this bladder tumor along with bilateral retrograde pyelography and postoperative mitomycin C intravesical instillation. We reviewed the potential risks, complications, and expected recovery process associated with this procedure. Considering his dementia, I also called his wife today and reviewed the findings and the indicated procedure as well as the potential risks. They feel comfortable proceeding and understand this is similar to the procedures he has undergone in the past.    2. Asymptomatic bacteriuria: His urine as been cultured and he will be empirically started on Bactrim with antibiotic changed as appropriate. This will be treated  prior to undergoing his transurethral resection.    3. BPH/LUTS: His symptoms remain well controlled with Avodart.     Verified Results URINE CYTOLOGY1 [id number] 03:24PM1 Read Drivers  SPECIMEN TYPE: OTHER   Test Name Result Flag  Reference  FINAL DIAGNOSIS:1  A1   - ABNORMAL FINDINGS. - ATYPICAL UROTHELIAL CELLS ARE PRESENT.  SOURCE:1 Bladder Washing1    110CC OF LIGHT YELLOW BLW RECEIVED 1 SLIDE PREPARED [id number]  Relevant Clinical Info1 188.[place]    PATHOLOGIST:1     REVIEWED BY VALERIE J. FIELDS, MD, FCAP (ELECTRONIC SIGNATURE ON FILE)  NUMBER OF SLIDES1     1 Container Submitted  CYTOTECHNOLOGIST:1     JC, BS CT(ASCP)   URINE CULTURE2 [id number] 02:22PM2 Anselm Pancoast  SOURCE : CLEAN CATCH SPECIMEN TYPE: CLEAN CATCH  [Jul 30, 2013 9:17AM Azlyn Wingler] Pt has been called in TMP SMX. Will change pre-op antibiotic to Cefazolin.   Test Name Result Flag Reference  CULTURE, URINE2 Culture, Urine2    ===== COLONY COUNT: =====  >=100,000 COLONIES/ML   FINAL REPORT: STAPHYLOCOCCUS SPECIES (COAGULASE NEGATIVE)  RIFAMPIN AND GENTAMICIN SHOULD NOT BE USED AS SINGLE DRUGS FOR TREATMENT OF STAPH INFECTIONS.   SENSITIVITY FOR: STAPHYLOCOCCUS SPECIES (COAGULASE NEGATIVE)    PENICILLIN                             RESISTANT      >=0.5    OXACILLIN                              SENSITIVE     <=0.25    CEFAZOLIN                              SENSITIVE               GENTAMICIN                             SENSITIVE      <=0.5    CIPROFLOXACIN                          RESISTANT        >=8    LEVOFLOXACIN                           RESISTANT        >=8    NITROFURANTOIN                         SENSITIVE       <=16    TRIMETH/SULFA                          SENSITIVE       <=10    VANCOMYCIN                             SENSITIVE      <=0.5    RIFAMPIN                               SENSITIVE      <=  0.5    TETRACYCLINE                           SENSITIVE          4    END OF REPORT     1. Amended By: Raynelle Bring; Jul 27 2013 11:04 PM EST  2. Amended By: Raynelle Bring; Jul 30 2013 9:17 AM EST  Signatures Electronically signed by : Raynelle Bring, M.D.; Jul 30 2013  9:18AM EST

## 2013-08-14 ENCOUNTER — Ambulatory Visit (HOSPITAL_COMMUNITY): Payer: Medicare Other | Admitting: Anesthesiology

## 2013-08-14 ENCOUNTER — Encounter (HOSPITAL_COMMUNITY)
Admission: RE | Disposition: A | Discharge status: Home / Self Care | Payer: Self-pay | Source: Ambulatory Visit | Attending: Urology

## 2013-08-14 ENCOUNTER — Encounter (HOSPITAL_COMMUNITY): Payer: Self-pay | Admitting: *Deleted

## 2013-08-14 ENCOUNTER — Encounter (HOSPITAL_COMMUNITY): Payer: Medicare Other | Admitting: Anesthesiology

## 2013-08-14 ENCOUNTER — Ambulatory Visit (HOSPITAL_COMMUNITY)
Admission: RE | Admit: 2013-08-14 | Discharge: 2013-08-14 | Disposition: A | Discharge status: Home / Self Care | Payer: Medicare Other | Source: Ambulatory Visit | Attending: Urology | Admitting: Urology

## 2013-08-14 DIAGNOSIS — Z883 Allergy status to other anti-infective agents status: Secondary | ICD-10-CM | POA: Diagnosis not present

## 2013-08-14 DIAGNOSIS — Z79899 Other long term (current) drug therapy: Secondary | ICD-10-CM | POA: Diagnosis not present

## 2013-08-14 DIAGNOSIS — N139 Obstructive and reflux uropathy, unspecified: Secondary | ICD-10-CM | POA: Diagnosis not present

## 2013-08-14 DIAGNOSIS — Z87442 Personal history of urinary calculi: Secondary | ICD-10-CM | POA: Insufficient documentation

## 2013-08-14 DIAGNOSIS — E119 Type 2 diabetes mellitus without complications: Secondary | ICD-10-CM | POA: Diagnosis not present

## 2013-08-14 DIAGNOSIS — C679 Malignant neoplasm of bladder, unspecified: Secondary | ICD-10-CM | POA: Diagnosis not present

## 2013-08-14 DIAGNOSIS — C670 Malignant neoplasm of trigone of bladder: Secondary | ICD-10-CM | POA: Insufficient documentation

## 2013-08-14 DIAGNOSIS — N135 Crossing vessel and stricture of ureter without hydronephrosis: Secondary | ICD-10-CM | POA: Diagnosis not present

## 2013-08-14 DIAGNOSIS — I10 Essential (primary) hypertension: Secondary | ICD-10-CM | POA: Insufficient documentation

## 2013-08-14 DIAGNOSIS — F0280 Dementia in other diseases classified elsewhere without behavioral disturbance: Secondary | ICD-10-CM | POA: Insufficient documentation

## 2013-08-14 DIAGNOSIS — N401 Enlarged prostate with lower urinary tract symptoms: Secondary | ICD-10-CM | POA: Insufficient documentation

## 2013-08-14 DIAGNOSIS — R82998 Other abnormal findings in urine: Secondary | ICD-10-CM | POA: Diagnosis not present

## 2013-08-14 DIAGNOSIS — G309 Alzheimer's disease, unspecified: Secondary | ICD-10-CM | POA: Insufficient documentation

## 2013-08-14 DIAGNOSIS — N138 Other obstructive and reflux uropathy: Secondary | ICD-10-CM | POA: Diagnosis not present

## 2013-08-14 DIAGNOSIS — N200 Calculus of kidney: Secondary | ICD-10-CM | POA: Diagnosis not present

## 2013-08-14 HISTORY — PX: TRANSURETHRAL RESECTION OF BLADDER TUMOR: SHX2575

## 2013-08-14 HISTORY — PX: CYSTOSCOPY W/ RETROGRADES: SHX1426

## 2013-08-14 LAB — GLUCOSE, CAPILLARY
GLUCOSE-CAPILLARY: 171 mg/dL — AB (ref 70–99)
Glucose-Capillary: 223 mg/dL — ABNORMAL HIGH (ref 70–99)

## 2013-08-14 SURGERY — CYSTOSCOPY, WITH RETROGRADE PYELOGRAM
Anesthesia: General | Site: Ureter

## 2013-08-14 MED ORDER — SODIUM CHLORIDE 0.9 % IR SOLN
Status: DC | PRN
  Administered 2013-08-14: 3000 mL

## 2013-08-14 MED ORDER — FENTANYL CITRATE 0.05 MG/ML IJ SOLN: 25.0000 ug | INTRAMUSCULAR | Status: DC | PRN

## 2013-08-14 MED ORDER — LIDOCAINE HCL 1 % IJ SOLN
INTRAMUSCULAR | Status: DC | PRN
  Administered 2013-08-14: 50 mg via INTRADERMAL

## 2013-08-14 MED ORDER — PROMETHAZINE HCL 25 MG/ML IJ SOLN
6.2500 mg | INTRAMUSCULAR | Status: DC | PRN
Start: 2013-08-14 — End: 2013-08-14

## 2013-08-14 MED ORDER — 0.9 % SODIUM CHLORIDE (POUR BTL) OPTIME
TOPICAL | Status: DC | PRN
  Administered 2013-08-14: 1000 mL

## 2013-08-14 MED ORDER — LACTATED RINGERS IV SOLN
INTRAVENOUS | Status: DC
  Administered 2013-08-14: 1000 mL via INTRAVENOUS

## 2013-08-14 MED ORDER — ONDANSETRON HCL 4 MG/2ML IJ SOLN
INTRAMUSCULAR | Status: AC
  Filled 2013-08-14: qty 2

## 2013-08-14 MED ORDER — MIDAZOLAM HCL 2 MG/2ML IJ SOLN
INTRAMUSCULAR | Status: AC
  Filled 2013-08-14: qty 2

## 2013-08-14 MED ORDER — IOHEXOL 300 MG/ML  SOLN
INTRAMUSCULAR | Status: DC | PRN
Start: 2013-08-14 — End: 2013-08-14
  Administered 2013-08-14: 15 mL

## 2013-08-14 MED ORDER — MITOMYCIN CHEMO FOR BLADDER INSTILLATION 40 MG
40.0000 mg | Freq: Once | INTRAVENOUS | Status: DC
  Filled 2013-08-14: qty 40

## 2013-08-14 MED ORDER — HYDROCODONE-ACETAMINOPHEN 5-325 MG PO TABS: 1.0000 | ORAL_TABLET | Freq: Four times a day (QID) | ORAL | Status: DC | PRN

## 2013-08-14 MED ORDER — CEFAZOLIN SODIUM-DEXTROSE 2-3 GM-% IV SOLR
2.0000 g | Freq: Once | INTRAVENOUS | Status: AC
  Administered 2013-08-14: 2 g via INTRAVENOUS

## 2013-08-14 MED ORDER — PROPOFOL 10 MG/ML IV BOLUS
INTRAVENOUS | Status: AC
  Filled 2013-08-14: qty 20

## 2013-08-14 MED ORDER — LIDOCAINE HCL 2 % EX GEL
CUTANEOUS | Status: AC
  Filled 2013-08-14: qty 10

## 2013-08-14 MED ORDER — PHENYLEPHRINE 40 MCG/ML (10ML) SYRINGE FOR IV PUSH (FOR BLOOD PRESSURE SUPPORT)
PREFILLED_SYRINGE | INTRAVENOUS | Status: AC
  Filled 2013-08-14: qty 10

## 2013-08-14 MED ORDER — CEPHALEXIN 500 MG PO TABS: 500.0000 mg | ORAL_TABLET | Freq: Three times a day (TID) | ORAL | Status: DC

## 2013-08-14 MED ORDER — PROPOFOL 10 MG/ML IV BOLUS
INTRAVENOUS | Status: DC | PRN
  Administered 2013-08-14: 140 mg via INTRAVENOUS

## 2013-08-14 MED ORDER — FENTANYL CITRATE 0.05 MG/ML IJ SOLN
INTRAMUSCULAR | Status: DC | PRN
  Administered 2013-08-14: 50 ug via INTRAVENOUS
  Administered 2013-08-14: 25 ug via INTRAVENOUS

## 2013-08-14 MED ORDER — ONDANSETRON HCL 4 MG/2ML IJ SOLN
INTRAMUSCULAR | Status: DC | PRN
  Administered 2013-08-14: 4 mg via INTRAVENOUS

## 2013-08-14 MED ORDER — PHENYLEPHRINE HCL 10 MG/ML IJ SOLN
INTRAMUSCULAR | Status: DC | PRN
  Administered 2013-08-14: 40 ug via INTRAVENOUS

## 2013-08-14 MED ORDER — LIDOCAINE HCL (CARDIAC) 20 MG/ML IV SOLN
INTRAVENOUS | Status: AC
  Filled 2013-08-14: qty 5

## 2013-08-14 MED ORDER — CEFAZOLIN SODIUM-DEXTROSE 2-3 GM-% IV SOLR
INTRAVENOUS | Status: AC
  Filled 2013-08-14: qty 50

## 2013-08-14 MED ORDER — FENTANYL CITRATE 0.05 MG/ML IJ SOLN
INTRAMUSCULAR | Status: AC
  Filled 2013-08-14: qty 2

## 2013-08-14 SURGICAL SUPPLY — 27 items
BAG URINE DRAINAGE (UROLOGICAL SUPPLIES) IMPLANT
BAG URO CATCHER STRL LF (DRAPE) ×4 IMPLANT
DRAPE CAMERA CLOSED 9X96 (DRAPES) ×4 IMPLANT
ELECT LOOP MED HF 24F 12D CBL (CLIP) ×4 IMPLANT
ELECT REM PT RETURN 9FT ADLT (ELECTROSURGICAL) ×4
ELECTRODE REM PT RTRN 9FT ADLT (ELECTROSURGICAL) ×2 IMPLANT
EVACUATOR MICROVAS BLADDER (UROLOGICAL SUPPLIES) IMPLANT
GLOVE BIOGEL M STRL SZ7.5 (GLOVE) ×4 IMPLANT
GLOVE BIOGEL PI IND STRL 6 (GLOVE) ×2 IMPLANT
GLOVE BIOGEL PI INDICATOR 6 (GLOVE) ×2
GLOVE SURG SS PI 6.0 STRL IVOR (GLOVE) ×4 IMPLANT
GOWN STRL NON-REIN LRG LVL3 (GOWN DISPOSABLE) ×4 IMPLANT
GOWN STRL REUS W/TWL LRG LVL3 (GOWN DISPOSABLE) ×4 IMPLANT
GUIDEWIRE STR DUAL SENSOR (WIRE) ×4 IMPLANT
IV NS IRRIG 3000ML ARTHROMATIC (IV SOLUTION) ×4 IMPLANT
KIT ASPIRATION TUBING (SET/KITS/TRAYS/PACK) IMPLANT
LOOPS RESECTOSCOPE DISP (ELECTROSURGICAL) ×4 IMPLANT
MANIFOLD NEPTUNE II (INSTRUMENTS) ×4 IMPLANT
NDL SAFETY ECLIPSE 18X1.5 (NEEDLE) ×2 IMPLANT
NEEDLE HYPO 18GX1.5 SHARP (NEEDLE) ×2
NS IRRIG 1000ML POUR BTL (IV SOLUTION) ×8 IMPLANT
PACK CYSTO (CUSTOM PROCEDURE TRAY) ×4 IMPLANT
SHEATH ACCESS URETERAL 38CM (SHEATH) ×4 IMPLANT
STENT CONTOUR 6FRX24X.038 (STENTS) ×4 IMPLANT
SYRINGE IRR TOOMEY STRL 70CC (SYRINGE) IMPLANT
TUBING CONNECTING 10 (TUBING) ×3 IMPLANT
TUBING CONNECTING 10' (TUBING) ×1

## 2013-08-14 NOTE — Anesthesia Postprocedure Evaluation (Signed)
  Anesthesia Post-op Note  Patient: [name] [name]  Procedure(s) Performed: Procedure(s) (LRB): CYSTOSCOPY WITH BILATERAL  RETROGRADE PYELOGRAM/LEFT DIGITAL FLEXIBLE URETEROSCOPY/INSERTION LEFT URETERAL STENT (Bilateral) TRANSURETHRAL RESECTION OF BLADDER TUMOR (TURBT) (N/A)  Patient Location: PACU  Anesthesia Type: General  Level of Consciousness: awake and alert   Airway and Oxygen Therapy: Patient Spontanous Breathing  Post-op Pain: mild  Post-op Assessment: Post-op Vital signs reviewed, Patient's Cardiovascular Status Stable, Respiratory Function Stable, Patent Airway and No signs of Nausea or vomiting  Last Vitals:  Filed Vitals:   08/14/13 1724  BP: 153/82  Pulse: 88  Temp:   Resp: 18    Post-op Vital Signs: stable   Complications: No apparent anesthesia complications

## 2013-08-14 NOTE — Anesthesia Preprocedure Evaluation (Signed)
Anesthesia Evaluation  Patient identified by MRN, date of birth, ID band Patient awake    Reviewed: Allergy & Precautions, H&P , NPO status , Patient's Chart, lab work & pertinent test results  Airway Mallampati: II TM Distance: >3 FB Neck ROM: Full    Dental no notable dental hx.    Pulmonary neg pulmonary ROS,  breath sounds clear to auscultation  Pulmonary exam normal       Cardiovascular hypertension, Pt. on medications Rhythm:Regular Rate:Normal     Neuro/Psych Dementia 12-11-11 memory short term(steadily progressive worsening-pt. unaware) .dx. Alzheimers  negative psych ROS   GI/Hepatic negative GI ROS, Neg liver ROS,   Endo/Other  diabetes, Type 2  Renal/GU negative Renal ROS  negative genitourinary   Musculoskeletal negative musculoskeletal ROS (+)   Abdominal   Peds negative pediatric ROS (+)  Hematology negative hematology ROS (+)   Anesthesia Other Findings   Reproductive/Obstetrics negative OB ROS                           Anesthesia Physical Anesthesia Plan  ASA: III  Anesthesia Plan: General   Post-op Pain Management:    Induction: Intravenous  Airway Management Planned: LMA  Additional Equipment:   Intra-op Plan:   Post-operative Plan:   Informed Consent: I have reviewed the patients History and Physical, chart, labs and discussed the procedure including the risks, benefits and alternatives for the proposed anesthesia with the patient or authorized representative who has indicated his/her understanding and acceptance.   Dental advisory given  Plan Discussed with: CRNA and Surgeon  Anesthesia Plan Comments: (No versed)        Anesthesia Quick Evaluation

## 2013-08-14 NOTE — Transfer of Care (Signed)
Immediate Anesthesia Transfer of Care Note  Patient: [name] [name]  Procedure(s) Performed: Procedure(s): CYSTOSCOPY WITH BILATERAL  RETROGRADE PYELOGRAM/LEFT DIGITAL FLEXIBLE URETEROSCOPY/INSERTION LEFT URETERAL STENT (Bilateral) TRANSURETHRAL RESECTION OF BLADDER TUMOR (TURBT) (N/A)  Patient Location: PACU  Anesthesia Type:General  Level of Consciousness: awake, alert  and patient cooperative  Airway & Oxygen Therapy: Patient Spontanous Breathing and Patient connected to face mask oxygen  Post-op Assessment: Report given to PACU RN and Post -op Vital signs reviewed and stable  Post vital signs: Reviewed and stable  Complications: No apparent anesthesia complications

## 2013-08-14 NOTE — Progress Notes (Signed)
Dr. Kalman Shan notified of patient's blood glucose.

## 2013-08-14 NOTE — CV Procedure (Signed)
Preoperative diagnosis:  1. Urothelial carcinoma  Postoperative diagnosis: 1. Urothelial carcinoma 2.  Partial staghorn left renal calculus  Procedure(s): 1. Cystoscopy 2.  Examination under anesthesia 3.  Bilateral retrograde pyelography with interpretation 4.  Left ureteroscopy 5.  Left ureteral stent placement (6 x 24 - no straining) 6.  Transurethral resection of bladder tumor (1 cm)  Surgeon: [name] [name], [name] [name]  Anesthesia: General  Complications: None  EBL: Minimal  Specimens: Left trigone bladder tumor  Disposition of specimens: Pathology  Intraoperative findings: Right retrograde pyelography was performed with a 6 French ureteral catheter and Omnipaque contrast.  This demonstrated a normal caliber ureter without filling defects.  The renal pelvis was not dilated and there were no filling defects.  Initially, the lower pole calyx was not visualized but eventually did fill out.  A left retrograde pyelogram was then performed in a similar fashion.  This revealed a normal caliber ureter without filling defects.  The renal pelvis was noted to be somewhat dilated along with calyceal dilation.  There appeared to be a very large filling defect within the left renal pelvis filling almost the entire renal pelvis.  Indication: [name] [name] is a 73 year old gentleman with a history of low-grade urothelial carcinoma.On recent cystoscopic surveillance, he was found to have a small 1 cm bladder tumor toward the left trigone.  It was recommended that he proceed with the above procedures and the potential risks, complications, and expected recovery process were discussed in detail.  Informed consent was obtained.  I also reviewed this information in detail with his wife considering the patient's dementia.  Description of procedure:  The patient was taken to the operating room and a general anesthetic was administered.  He was given preoperative antibiotics and had been on culture specific  antibiotics leading up to his procedure.  He was placed in the dorsal lithotomy position and prepped and draped in usual sterile fashion.  Next, a preoperative timeout was performed.  Cystourethroscopy was then performed utilizing a 12 and a 70 degree lens.  Systematic examination of the bladder revealed the ureteral orifices to be in their expected anatomic locations and effluxing clear urine.  Further evaluation of the bladder did confirm a 1 cm papillary tumor located at the left trigone just distal to the left ureteral orifice.  No other bladder tumors, stones, or other mucosal pathology was identified.  Attention then turned to the right ureteral orifice.  This was cannulated with a 6 French ureteral catheter and Omnipaque contrast was injected.  Findings are as dictated above and revealed no significant abnormalities.  An identical procedure was then performed on the contralateral side.  A very large left renal pelvic filling defect was identified warranting further evaluation.  A 0.38 sensor guidewire was then advanced up into the left renal pelvis under fluoroscopic guidance.  A 12/14 ACMI ureteral access sheath was advanced over the wire into the proximal ureter.  The digital flexible ureteroscope was then advanced up into the left renal pelvis and a large partial staghorn calculus was immediately identified.  The renal pelvis and collecting system was evaluated around the stone and no tumor was identified.  I then replaced the guidewire into the appropriate position in the renal pelvis.  The access sheath was removed and the wire was back loaded on a cystoscope.  A 6 x 24 double-J ureteral stent was then advanced over the wire using Seldinger technique.  It was positioned appropriately under fluoroscopic and cystoscopic guidance.  The wire  was then removed and an adequate curl was noted in the renal pelvis as well as within the bladder.    The 24 Fr resectoscope was then placed in the bladder under  direct vision.  Utilizing the bipolar loop, I then resected the bladder tumor located at the left trigone along with the mucosa and muscularis tissue under the tumor.  This was resected in its completion.  This was located near the not involving the left ureteral orifice.  The specimen was removed and sent for permanent pathologic analysis.  Hemostasis was achieved utilizing bipolar cautery.  The bladder was emptied and then reinspected and hemostasis appeared excellent.  A pelvic exam under anesthesia was performed.  The patient appeared to have a normal digital rectal exam with a normal rectal tone.  No pelvic masses were noted on bimanual exam.  The patient tolerated the procedure well without complications.  He was able to be extubated and transferred to the recovery unit in satisfactory condition.

## 2013-08-15 ENCOUNTER — Encounter (HOSPITAL_COMMUNITY): Payer: Self-pay | Admitting: Urology

## 2013-08-30 ENCOUNTER — Ambulatory Visit: Payer: Federal, State, Local not specified - PPO | Admitting: Nurse Practitioner

## 2013-09-05 DIAGNOSIS — K8020 Calculus of gallbladder without cholecystitis without obstruction: Secondary | ICD-10-CM | POA: Diagnosis not present

## 2013-09-05 DIAGNOSIS — C679 Malignant neoplasm of bladder, unspecified: Secondary | ICD-10-CM | POA: Diagnosis not present

## 2013-09-05 DIAGNOSIS — N200 Calculus of kidney: Secondary | ICD-10-CM | POA: Diagnosis not present

## 2013-09-08 ENCOUNTER — Other Ambulatory Visit: Payer: Self-pay | Admitting: Urology

## 2013-09-11 ENCOUNTER — Other Ambulatory Visit (HOSPITAL_COMMUNITY): Payer: Self-pay | Admitting: Urology

## 2013-09-11 DIAGNOSIS — N200 Calculus of kidney: Secondary | ICD-10-CM

## 2013-09-21 ENCOUNTER — Encounter (HOSPITAL_COMMUNITY): Payer: Self-pay | Admitting: Pharmacy Technician

## 2013-09-28 ENCOUNTER — Encounter (HOSPITAL_COMMUNITY)
Admission: RE | Admit: 2013-09-28 | Discharge: 2013-09-28 | Disposition: A | Discharge status: Home / Self Care | Payer: Medicare Other | Source: Ambulatory Visit | Attending: Urology | Admitting: Urology

## 2013-09-28 ENCOUNTER — Encounter (HOSPITAL_COMMUNITY): Payer: Self-pay

## 2013-09-28 DIAGNOSIS — Z01810 Encounter for preprocedural cardiovascular examination: Secondary | ICD-10-CM | POA: Diagnosis not present

## 2013-09-28 DIAGNOSIS — Z01812 Encounter for preprocedural laboratory examination: Secondary | ICD-10-CM | POA: Insufficient documentation

## 2013-09-28 HISTORY — DX: Benign prostatic hyperplasia without lower urinary tract symptoms: N40.0

## 2013-09-28 LAB — CBC
HCT: 39.5 % (ref 39.0–52.0)
Hemoglobin: 13.5 g/dL (ref 13.0–17.0)
MCH: 28.1 pg (ref 26.0–34.0)
MCHC: 34.2 g/dL (ref 30.0–36.0)
MCV: 82.3 fL (ref 78.0–100.0)
PLATELETS: 232 10*3/uL (ref 150–400)
RBC: 4.8 MIL/uL (ref 4.22–5.81)
RDW: 13.3 % (ref 11.5–15.5)
WBC: 9.2 10*3/uL (ref 4.0–10.5)

## 2013-09-28 LAB — BASIC METABOLIC PANEL
BUN: 15 mg/dL (ref 6–23)
CO2: 27 mEq/L (ref 19–32)
CREATININE: 0.85 mg/dL (ref 0.50–1.35)
Calcium: 9.4 mg/dL (ref 8.4–10.5)
Chloride: 101 mEq/L (ref 96–112)
GFR calc Af Amer: >90 mL/min (ref >90)
GFR, EST NON AFRICAN AMERICAN: 86 mL/min — AB (ref >90)
GLUCOSE: 271 mg/dL — AB (ref 70–99)
Potassium: 4.1 mEq/L (ref 3.7–5.3)
Sodium: 140 mEq/L (ref 137–147)

## 2013-09-28 LAB — APTT: aPTT: 29 seconds (ref 24–37)

## 2013-09-28 LAB — PROTIME-INR
INR: 0.92 (ref 0.00–1.49)
PROTHROMBIN TIME: 12.2 s (ref 11.6–15.2)

## 2013-09-28 NOTE — Pre-Procedure Instructions (Signed)
CXR REPORT IN EPIC FROM 08/09/13.   EKG WAS DONE TODAY - PREOP AT [facility]. PREOP ORDERS REQUESTED FROM RADIOLOGY- DR. Lynne Logan PREOP ORDERS ARE IN EPIC  PT ALERT - BUT HAS MEMORY PROBLEMS AND NOT ABLE TO EXPRESS WHAT HIS SURGERY WILL BE.  HIS WIFE PRESENT AND HAS HCPOA AND SHE SIGNED HIS  OR AND BLOOD CONSENTS--HIS CONSENT FOR RADIOLOGY PROCEDURE WILL BE OBTAINED IN RADIOLOGY DAY OF SURGERY.

## 2013-09-28 NOTE — Patient Instructions (Addendum)
   Y[facility] SURGERY IS SCHEDULED [facility]  ON:  Thursday  4/30  REPORT TO  RADIOLOGY DEPARTMENT  AT 7:15 AM      PHONE # FOR RADIOLOGY IS [phone] AND  SHORT STAY IS [phone]  DO NOT EAT OR DRINK ANYTHING AFTER MIDNIGHT THE NIGHT BEFORE YOUR SURGERY.  YOU MAY BRUSH YOUR TEETH, RINSE OUT YOUR MOUTH--BUT NO WATER, NO FOOD, NO CHEWING GUM, NO MINTS, NO CANDIES, NO CHEWING TOBACCO.  PLEASE TAKE THE FOLLOWING MEDICATIONS THE AM OF YOUR SURGERY WITH A FEW SIPS OF WATER:  METOPROLOL, AVODART, NAMENDA   IF YOU ARE DIABETIC:  DO NOT TAKE ANY DIABETIC MEDICATIONS THE AM OF YOUR SURGERY.   DO NOT BRING VALUABLES, MONEY, CREDIT CARDS.    DO NOT WEAR JEWELRY, MAKE-UP, NAIL POLISH AND NO METAL PINS OR CLIPS IN YOUR HAIR. CONTACT LENS, DENTURES / PARTIALS, GLASSES SHOULD NOT BE WORN TO SURGERY AND IN MOST CASES-HEARING AIDS WILL NEED TO BE REMOVED.  BRING YOUR GLASSES CASE, ANY EQUIPMENT NEEDED FOR YOUR CONTACT LENS. FOR PATIENTS ADMITTED TO THE HOSPITAL--CHECK OUT TIME THE DAY OF DISCHARGE IS 11:00 AM.  ALL INPATIENT ROOMS ARE PRIVATE - WITH BATHROOM, TELEPHONE, TELEVISION AND WIFI INTERNET.                                                    PLEASE READ OVER ANY  FACT SHEETS THAT YOU WERE GIVEN:  BLOOD TRANSFUSION INFORMATION  FAILURE TO FOLLOW THESE INSTRUCTIONS MAY RESULT IN THE CANCELLATION OF YOUR SURGERY. PLEASE BE AWARE THAT YOU MAY NEED ADDITIONAL BLOOD DRAWN DAY OF YOUR SURGERY  PATIENT SIGNATURE_________________________________

## 2013-09-29 DIAGNOSIS — E119 Type 2 diabetes mellitus without complications: Secondary | ICD-10-CM | POA: Diagnosis not present

## 2013-09-29 DIAGNOSIS — C679 Malignant neoplasm of bladder, unspecified: Secondary | ICD-10-CM | POA: Diagnosis not present

## 2013-09-29 NOTE — Pre-Procedure Instructions (Signed)
PT'S PREOP BMET REPORT FAXED TO DR. Lynne Logan OFFICE - FOR REVIEW OF ELEVATED GLUCOSE-PT IS DIABETIC

## 2013-10-05 ENCOUNTER — Other Ambulatory Visit: Payer: Self-pay | Admitting: Radiology

## 2013-10-11 NOTE — H&P (Signed)
History of Present Illness [name] [name] is a 73 year old previously followed by Dr. Tresa Endo. 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 He was noted to have a recurrence in 2013 and underwent TURBT in July 2013. He was also incidentally noted to have a stricture of the distal left ureter on RPG studies at that time and a brush biopsy was performed.    Oct 2007: TURBT, Low grade Ta  Jul 2013: TURBT (right bladder neck 1.5 cm tumor, low grade Ta), brush biopsy of distal left ureteral stricture (no malignancy)  Feb 2014: TURBT (1 cm tumor left hemitrigone/bladder neck) -     Last cysto: Feb 2014  Last upper tract imaging: B RPGs (Jul 2013) - left ureteral stricture (did not appear to be obstructing)    2) Nephrolithiasis: He has a long standing history of calcium oxalate stones.    3) Prostate cancer screening:   Last PSA: 0.35 (June 2014)    4) BPH/LUTS: He is treated with Avodart.    Interval history:   He underwent left sided ureteroscopy due to a large filling defect noted in his left renal pelvis on retrograde pyelography. This was ultimately found to be a large left renal pelvic calculus. He has recovered well and is tolerating his indwelling left ureteral stent without difficulty. He follows up today after undergoing a stone CT.     Past Medical History Problems  1. Bladder cancer (188.9) 2. History of diabetes mellitus (V12.29) 3. History of hypertension (V12.59) 4. History of kidney stones (V13.01)  Surgical History Problems  1. History of Cystoscopy With Fulguration 2. History of Cystoscopy With Fulguration Small Lesion (5-4mm) 3. History of Cystoscopy With Insertion Of Ureteral Stent Left 4. History of Cystoscopy With Insertion Of Ureteral Stent Right 5. History of Cystoscopy With Insertion Of Ureteral Stent Right 6.  History of Cystoscopy With Pyeloscopy With Lithotripsy 7. History of Cystoscopy With Ureteroscopy With Removal Of Calculus  Current Meds 1. Avodart 0.5 MG Oral Capsule; Take 1 capsule by mouth once daily;  Therapy: [id number] to (Evaluate:16Jul2015)  Requested for: 21Jul2014; Last  Rx:21Jul2014 Ordered 2. Fish Oil CAPS;  Therapy: (Recorded:16Feb2010) to Recorded 3. Fluorouracil 5 % External Cream;  Therapy: [id number] to Recorded 4. Fluticasone Propionate 0.05 % External Cream;  Therapy: [id number] to Recorded 5. Hydrochlorothiazide 25 MG Oral Tablet;  Therapy: (Recorded:29Nov2007) to Recorded 6. Hydrocodone-Acetaminophen 5-325 MG Oral Tablet;  Therapy: [id number] to Recorded 7. MetFORMIN HCl - 500 MG Oral Tablet;  Therapy: (Recorded:05Mar2014) to Recorded 8. Namenda XR 28 MG Oral Capsule Extended Release 24 Hour;  Therapy: 27Feb2014 to Recorded 9. Onglyza 5 MG Oral Tablet;  Therapy: 12WPY0998 to Recorded 10. Perindopril Erbumine 4 MG Oral Tablet;   Therapy: (Recorded:05Mar2014) to Recorded 11. Potassium Citrate ER 10 MEQ (1080 MG) Oral Tablet Extended Release; take 1 tablet by   mouth three times a day;   Therapy: 33ASN0539 to (Evaluate:12Mar2015)  Requested for: [id number]; Last   Rx:17Mar2014 Ordered 12. Vitamin B Complex TABS;   Therapy: (Recorded:16Feb2010) to Recorded 13. Vitamin B12 TABS;   Therapy: (Recorded:16Feb2010) to Recorded 14. Vitamin C TABS;   Therapy: (Recorded:16Feb2010) to Recorded 15. Vitamin E TABS;   Therapy: (Recorded:16Feb2010) to Recorded  Allergies Medication  1. Nitrofurantoin Monohyd Macro CAPS 2. Tetracyclines  Family History Problems  1. No pertinent family history : Mother 2. Denied: Family  history of Prostate Cancer  Social History Problems  1. Never A Smoker  Review of Systems  Genitourinary: no hematuria.  Constitutional: no fever.    Physical Exam Constitutional: Well nourished and well developed . No acute distress.  ENT:.  The ears and nose are normal in appearance.  Neck: The appearance of the neck is normal and no neck mass is present.  Pulmonary: No respiratory distress, normal respiratory rhythm and effort and clear bilateral breath sounds.  Cardiovascular: Heart rate and rhythm are normal . No peripheral edema.  Abdomen: The abdomen is soft and nontender. No masses are palpated. No CVA tenderness. No hernias are palpable. No hepatosplenomegaly noted.  Skin: Normal skin turgor, no visible rash and no visible skin lesions.  Neuro/Psych:. Mood and affect are appropriate.    Results/Data Urine [Data Includes: Last 1 Day]   [id number]  COLOR YELLOW   APPEARANCE CLOUDY   SPECIFIC GRAVITY 1.015   pH 6.0   GLUCOSE 250 mg/dL  BILIRUBIN NEG   KETONE NEG mg/dL  BLOOD LARGE   PROTEIN NEG mg/dL  UROBILINOGEN 0.2 mg/dL  NITRITE POS   LEUKOCYTE ESTERASE MOD   SQUAMOUS EPITHELIAL/HPF RARE   WBC 21-50 WBC/hpf  RBC 7-10 RBC/hpf  BACTERIA MODERATE   CRYSTALS NONE SEEN   CASTS NONE SEEN   Other AMORPHOUS NOTED   Selected Results  AU CT-STONE PROTOCOL [id number] 12:00AM Raynelle Bring   Test Name Result Flag Reference  CT-STONE PROTOCOL (Report)    ** RADIOLOGY REPORT BY [facility] **   CLINICAL DATA: Bladder cancer. Query left renal stone.  EXAM: CT ABDOMEN AND PELVIS WITHOUT CONTRAST (URINARY CALCULUS PROTOCOL)  TECHNIQUE: Multidetector CT imaging was performed through the abdomen and pelvis without intravenous contrast to include the urinary tract.  COMPARISON: DG ABDOMEN 1V dated 11/25/2011  FINDINGS: Large irregular gallstone, 4.4 cm in long axis.  The noncontrast CT appearance of the visualized portion of the liver, spleen, pancreas, and adrenal glands is within normal limits.  The left-sided double-J ureteral stent partially coils about of the 3.2 x 2.6 by 2.8 cm left collecting system staghorn calculus. Mild to moderate left caliectasis. No right renal calculus  observed. Fetal lobulation of the kidneys noted.  No additional calculi identified. No pathologic upper abdominal adenopathy is observed.  Lumbar spondylosis observed with facet arthropathy causing osseous foraminal narrowing on the left at L2-3, L3-4, and L4-5; and on the right at L3-4 and L4-5, assuming that the transitional lumbosacral vertebra represents L5. Appendix not well seen. Mild levoconvex lumbar scoliosis.  Elevated left hemidiaphragm. There is evidence of transurethral resection of the prostate. Prostate size 5.5 x 4.0 cm. No well visualized bladder mass.  IMPRESSION: 1. Left collecting system staghorn calculus measures up to 3.2 cm in diameter and is associated with Mild to moderate left caliectasis. The left double-J ureteral stent partially coils about this calculus. 2. Large gallstone. 3. Lumbar spondylosis and facet arthropathy causing multilevel osseous foraminal narrowing. 4. Elevated left hemidiaphragm.   Electronically Signed  By: Sherryl Barters M.D.  On: 09/05/2013 11:35    Urine has been cultured.  Assessment Assessed  1.  Nephrolithiasis (592.0)  Plan   1. Large left renal pelvic calculus: His renal pelvic stone measures approximately 3.6 centimeters. I have recommended that he proceed with left percutaneous nephrolithotomy for definitive therapy considering the size of the stone. We also discussed the fact that this could be a struvite stone considering his recurrent bacteriuria with urease producing bacteria.  We have  reviewed the potential risks and complications associated with percutaneous nephrolithotomy as well as expected recovery process and the potential need for a second look procedure. He and his wife are agreeable to proceed as planned.

## 2013-10-12 ENCOUNTER — Observation Stay (HOSPITAL_COMMUNITY): Payer: Medicare Other

## 2013-10-12 ENCOUNTER — Inpatient Hospital Stay (HOSPITAL_COMMUNITY): Payer: Medicare Other | Admitting: *Deleted

## 2013-10-12 ENCOUNTER — Encounter (HOSPITAL_COMMUNITY): Payer: Medicare Other | Admitting: *Deleted

## 2013-10-12 ENCOUNTER — Ambulatory Visit (HOSPITAL_COMMUNITY)
Admission: RE | Admit: 2013-10-12 | Discharge: 2013-10-12 | Disposition: A | Discharge status: Home / Self Care | Payer: Medicare Other | Source: Ambulatory Visit | Attending: Urology | Admitting: Urology

## 2013-10-12 ENCOUNTER — Inpatient Hospital Stay (HOSPITAL_COMMUNITY): Payer: Medicare Other

## 2013-10-12 ENCOUNTER — Encounter (HOSPITAL_COMMUNITY)
Admission: RE | Disposition: A | Discharge status: Home / Self Care | Payer: Self-pay | Source: Ambulatory Visit | Attending: Urology

## 2013-10-12 ENCOUNTER — Observation Stay (HOSPITAL_COMMUNITY)
Admission: RE | Admit: 2013-10-12 | Discharge: 2013-10-13 | Disposition: A | Discharge status: Home / Self Care | Payer: Medicare Other | Source: Ambulatory Visit | Attending: Urology | Admitting: Urology

## 2013-10-12 ENCOUNTER — Encounter (HOSPITAL_COMMUNITY): Payer: Self-pay | Admitting: *Deleted

## 2013-10-12 ENCOUNTER — Encounter (HOSPITAL_COMMUNITY): Payer: Self-pay

## 2013-10-12 ENCOUNTER — Encounter (HOSPITAL_COMMUNITY): Payer: Self-pay | Admitting: Anesthesiology

## 2013-10-12 VITALS — BP 133/77 | HR 75 | Temp 97.7°F | Resp 15

## 2013-10-12 DIAGNOSIS — Z8551 Personal history of malignant neoplasm of bladder: Secondary | ICD-10-CM | POA: Insufficient documentation

## 2013-10-12 DIAGNOSIS — I10 Essential (primary) hypertension: Secondary | ICD-10-CM | POA: Diagnosis not present

## 2013-10-12 DIAGNOSIS — N1330 Unspecified hydronephrosis: Secondary | ICD-10-CM | POA: Diagnosis not present

## 2013-10-12 DIAGNOSIS — N401 Enlarged prostate with lower urinary tract symptoms: Secondary | ICD-10-CM | POA: Diagnosis not present

## 2013-10-12 DIAGNOSIS — E119 Type 2 diabetes mellitus without complications: Secondary | ICD-10-CM | POA: Insufficient documentation

## 2013-10-12 DIAGNOSIS — E7800 Pure hypercholesterolemia, unspecified: Secondary | ICD-10-CM | POA: Insufficient documentation

## 2013-10-12 DIAGNOSIS — Z79899 Other long term (current) drug therapy: Secondary | ICD-10-CM | POA: Insufficient documentation

## 2013-10-12 DIAGNOSIS — K8020 Calculus of gallbladder without cholecystitis without obstruction: Secondary | ICD-10-CM | POA: Diagnosis not present

## 2013-10-12 DIAGNOSIS — N200 Calculus of kidney: Secondary | ICD-10-CM

## 2013-10-12 DIAGNOSIS — N138 Other obstructive and reflux uropathy: Secondary | ICD-10-CM | POA: Insufficient documentation

## 2013-10-12 DIAGNOSIS — F0280 Dementia in other diseases classified elsewhere without behavioral disturbance: Secondary | ICD-10-CM | POA: Diagnosis not present

## 2013-10-12 DIAGNOSIS — G309 Alzheimer's disease, unspecified: Secondary | ICD-10-CM | POA: Insufficient documentation

## 2013-10-12 DIAGNOSIS — Z85828 Personal history of other malignant neoplasm of skin: Secondary | ICD-10-CM | POA: Insufficient documentation

## 2013-10-12 DIAGNOSIS — N400 Enlarged prostate without lower urinary tract symptoms: Secondary | ICD-10-CM | POA: Diagnosis not present

## 2013-10-12 DIAGNOSIS — R935 Abnormal findings on diagnostic imaging of other abdominal regions, including retroperitoneum: Secondary | ICD-10-CM | POA: Diagnosis not present

## 2013-10-12 HISTORY — DX: Calculus of kidney: N20.0

## 2013-10-12 HISTORY — PX: NEPHROLITHOTOMY: SHX5134

## 2013-10-12 LAB — GLUCOSE, CAPILLARY
GLUCOSE-CAPILLARY: 208 mg/dL — AB (ref 70–99)
GLUCOSE-CAPILLARY: 234 mg/dL — AB (ref 70–99)
GLUCOSE-CAPILLARY: 211 mg/dL — AB (ref 70–99)
Glucose-Capillary: 200 mg/dL — ABNORMAL HIGH (ref 70–99)
Glucose-Capillary: 182 mg/dL — ABNORMAL HIGH (ref 70–99)
Glucose-Capillary: 169 mg/dL — ABNORMAL HIGH (ref 70–99)
Glucose-Capillary: 211 mg/dL — ABNORMAL HIGH (ref 70–99)

## 2013-10-12 LAB — HEMOGLOBIN AND HEMATOCRIT, BLOOD
HCT: 38.3 % — ABNORMAL LOW (ref 39.0–52.0)
HEMOGLOBIN: 13.7 g/dL (ref 13.0–17.0)

## 2013-10-12 LAB — APTT: aPTT: 29 seconds (ref 24–37)

## 2013-10-12 LAB — ABO/RH: ABO/RH(D): O POS

## 2013-10-12 LAB — PROTIME-INR
INR: 1.08 (ref 0.00–1.49)
Prothrombin Time: 13.8 seconds (ref 11.6–15.2)

## 2013-10-12 LAB — TYPE AND SCREEN
ABO/RH(D): O POS
Antibody Screen: NEGATIVE

## 2013-10-12 SURGERY — NEPHROLITHOTOMY PERCUTANEOUS
Anesthesia: General | Laterality: Left

## 2013-10-12 MED ORDER — INSULIN ASPART 100 UNIT/ML ~~LOC~~ SOLN
0.0000 [IU] | SUBCUTANEOUS | Status: DC
  Administered 2013-10-12 (×2): 5 [IU] via SUBCUTANEOUS
  Administered 2013-10-13: 3 [IU] via SUBCUTANEOUS
  Administered 2013-10-13: 5 [IU] via SUBCUTANEOUS

## 2013-10-12 MED ORDER — CEFAZOLIN SODIUM-DEXTROSE 2-3 GM-% IV SOLR
INTRAVENOUS | Status: AC
  Filled 2013-10-12: qty 50

## 2013-10-12 MED ORDER — MIDAZOLAM HCL 2 MG/2ML IJ SOLN
INTRAMUSCULAR | Status: AC | PRN
  Administered 2013-10-12: 1 mg via INTRAVENOUS

## 2013-10-12 MED ORDER — CEFAZOLIN SODIUM 1-5 GM-% IV SOLN
1.0000 g | Freq: Three times a day (TID) | INTRAVENOUS | Status: DC
  Administered 2013-10-12 – 2013-10-13 (×2): 1 g via INTRAVENOUS
  Filled 2013-10-12 (×4): qty 50

## 2013-10-12 MED ORDER — FENTANYL CITRATE 0.05 MG/ML IJ SOLN
INTRAMUSCULAR | Status: DC | PRN
  Administered 2013-10-12: 100 ug via INTRAVENOUS
  Administered 2013-10-12 (×3): 50 ug via INTRAVENOUS

## 2013-10-12 MED ORDER — HYDROCODONE-ACETAMINOPHEN 5-325 MG PO TABS: 1.0000 | ORAL_TABLET | Freq: Four times a day (QID) | ORAL | Status: DC | PRN

## 2013-10-12 MED ORDER — DUTASTERIDE 0.5 MG PO CAPS
0.5000 mg | ORAL_CAPSULE | Freq: Every morning | ORAL | Status: DC
  Administered 2013-10-13: 0.5 mg via ORAL
  Filled 2013-10-12: qty 1

## 2013-10-12 MED ORDER — INSULIN ASPART 100 UNIT/ML ~~LOC~~ SOLN
SUBCUTANEOUS | Status: AC
  Filled 2013-10-12: qty 1

## 2013-10-12 MED ORDER — CIPROFLOXACIN IN D5W 400 MG/200ML IV SOLN
400.0000 mg | Freq: Once | INTRAVENOUS | Status: AC
  Administered 2013-10-12: 400 mg via INTRAVENOUS

## 2013-10-12 MED ORDER — IOHEXOL 300 MG/ML  SOLN
25.0000 mL | Freq: Once | INTRAMUSCULAR | Status: AC | PRN
  Administered 2013-10-12: 1 mL

## 2013-10-12 MED ORDER — HYDROCODONE-ACETAMINOPHEN 5-325 MG PO TABS: 1.0000 | ORAL_TABLET | ORAL | Status: DC | PRN

## 2013-10-12 MED ORDER — DIPHENHYDRAMINE HCL 50 MG/ML IJ SOLN: 12.5000 mg | Freq: Four times a day (QID) | INTRAMUSCULAR | Status: DC | PRN

## 2013-10-12 MED ORDER — INSULIN ASPART 100 UNIT/ML ~~LOC~~ SOLN
SUBCUTANEOUS | Status: DC | PRN
  Administered 2013-10-12 (×2): 4 [IU] via SUBCUTANEOUS

## 2013-10-12 MED ORDER — METOCLOPRAMIDE HCL 5 MG/ML IJ SOLN
INTRAMUSCULAR | Status: DC | PRN
  Administered 2013-10-12: 10 mg via INTRAVENOUS

## 2013-10-12 MED ORDER — PROMETHAZINE HCL 25 MG/ML IJ SOLN: 6.2500 mg | INTRAMUSCULAR | Status: DC | PRN

## 2013-10-12 MED ORDER — LIDOCAINE HCL 1 % IJ SOLN
INTRAMUSCULAR | Status: AC
  Filled 2013-10-12: qty 20

## 2013-10-12 MED ORDER — TRANDOLAPRIL 2 MG PO TABS
2.0000 mg | ORAL_TABLET | Freq: Every day | ORAL | Status: DC
  Administered 2013-10-12 – 2013-10-13 (×2): 2 mg via ORAL
  Filled 2013-10-12 (×2): qty 1

## 2013-10-12 MED ORDER — HYDROCHLOROTHIAZIDE 25 MG PO TABS
25.0000 mg | ORAL_TABLET | Freq: Every morning | ORAL | Status: DC
  Administered 2013-10-13: 25 mg via ORAL
  Filled 2013-10-12: qty 1

## 2013-10-12 MED ORDER — CIPROFLOXACIN IN D5W 400 MG/200ML IV SOLN
400.0000 mg | Freq: Two times a day (BID) | INTRAVENOUS | Status: DC
  Administered 2013-10-12: 400 mg via INTRAVENOUS
  Filled 2013-10-12 (×2): qty 200

## 2013-10-12 MED ORDER — METOCLOPRAMIDE HCL 5 MG/ML IJ SOLN
INTRAMUSCULAR | Status: AC
  Filled 2013-10-12: qty 2

## 2013-10-12 MED ORDER — PROPOFOL 10 MG/ML IV BOLUS
INTRAVENOUS | Status: AC
  Filled 2013-10-12: qty 20

## 2013-10-12 MED ORDER — LACTATED RINGERS IV SOLN
INTRAVENOUS | Status: DC
  Administered 2013-10-12: 1000 mL via INTRAVENOUS
  Administered 2013-10-12: 15:00:00 via INTRAVENOUS

## 2013-10-12 MED ORDER — CISATRACURIUM BESYLATE 20 MG/10ML IV SOLN
INTRAVENOUS | Status: AC
  Filled 2013-10-12: qty 10

## 2013-10-12 MED ORDER — MEMANTINE HCL ER 28 MG PO CP24: 28.0000 mg | ORAL_CAPSULE | Freq: Every morning | ORAL | Status: DC

## 2013-10-12 MED ORDER — GLYCOPYRROLATE 0.2 MG/ML IJ SOLN
INTRAMUSCULAR | Status: DC | PRN
  Administered 2013-10-12: 0.6 mg via INTRAVENOUS

## 2013-10-12 MED ORDER — DOCUSATE SODIUM 100 MG PO CAPS
100.0000 mg | ORAL_CAPSULE | Freq: Two times a day (BID) | ORAL | Status: DC
  Administered 2013-10-12 – 2013-10-13 (×2): 100 mg via ORAL
  Filled 2013-10-12 (×3): qty 1

## 2013-10-12 MED ORDER — ONDANSETRON HCL 4 MG/2ML IJ SOLN
INTRAMUSCULAR | Status: DC | PRN
  Administered 2013-10-12: 4 mg via INTRAVENOUS

## 2013-10-12 MED ORDER — FENTANYL CITRATE 0.05 MG/ML IJ SOLN
INTRAMUSCULAR | Status: AC
  Filled 2013-10-12: qty 6

## 2013-10-12 MED ORDER — CISATRACURIUM BESYLATE (PF) 10 MG/5ML IV SOLN
INTRAVENOUS | Status: DC | PRN
  Administered 2013-10-12: 5 mg via INTRAVENOUS
  Administered 2013-10-12: 2 mg via INTRAVENOUS
  Administered 2013-10-12: 3 mg via INTRAVENOUS

## 2013-10-12 MED ORDER — POTASSIUM CHLORIDE IN NACL 20-0.45 MEQ/L-% IV SOLN
INTRAVENOUS | Status: DC
  Administered 2013-10-12: 1000 mL via INTRAVENOUS
  Filled 2013-10-12 (×4): qty 1000

## 2013-10-12 MED ORDER — CEFAZOLIN SODIUM-DEXTROSE 2-3 GM-% IV SOLR
2.0000 g | INTRAVENOUS | Status: AC
  Administered 2013-10-12: 2 g via INTRAVENOUS

## 2013-10-12 MED ORDER — IOHEXOL 300 MG/ML  SOLN
INTRAMUSCULAR | Status: DC | PRN
  Administered 2013-10-12: 30 mL

## 2013-10-12 MED ORDER — DOCUSATE SODIUM 100 MG PO CAPS: 100.0000 mg | ORAL_CAPSULE | Freq: Two times a day (BID) | ORAL | Status: DC

## 2013-10-12 MED ORDER — POTASSIUM CITRATE ER 10 MEQ (1080 MG) PO TBCR
10.0000 meq | EXTENDED_RELEASE_TABLET | Freq: Two times a day (BID) | ORAL | Status: DC
  Administered 2013-10-12 – 2013-10-13 (×2): 10 meq via ORAL
  Filled 2013-10-12 (×3): qty 1

## 2013-10-12 MED ORDER — HYDROMORPHONE HCL PF 1 MG/ML IJ SOLN
0.2500 mg | INTRAMUSCULAR | Status: DC | PRN
  Administered 2013-10-12 (×2): 0.5 mg via INTRAVENOUS

## 2013-10-12 MED ORDER — METOPROLOL SUCCINATE ER 25 MG PO TB24
25.0000 mg | ORAL_TABLET | Freq: Every morning | ORAL | Status: DC
  Administered 2013-10-13: 25 mg via ORAL
  Filled 2013-10-12: qty 1

## 2013-10-12 MED ORDER — SUCCINYLCHOLINE CHLORIDE 20 MG/ML IJ SOLN
INTRAMUSCULAR | Status: DC | PRN
  Administered 2013-10-12: 100 mg via INTRAVENOUS

## 2013-10-12 MED ORDER — CEPHALEXIN 500 MG PO CAPS: 500.0000 mg | ORAL_CAPSULE | Freq: Three times a day (TID) | ORAL | Status: DC

## 2013-10-12 MED ORDER — MIDAZOLAM HCL 2 MG/2ML IJ SOLN
INTRAMUSCULAR | Status: AC
  Filled 2013-10-12: qty 6

## 2013-10-12 MED ORDER — PROPOFOL 10 MG/ML IV BOLUS
INTRAVENOUS | Status: DC | PRN
  Administered 2013-10-12: 120 mg via INTRAVENOUS

## 2013-10-12 MED ORDER — CIPROFLOXACIN IN D5W 400 MG/200ML IV SOLN
INTRAVENOUS | Status: AC
  Administered 2013-10-12: 400 mg via INTRAVENOUS
  Filled 2013-10-12: qty 200

## 2013-10-12 MED ORDER — SODIUM CHLORIDE 0.9 % IR SOLN
Status: DC | PRN
  Administered 2013-10-12 (×6): 3000 mL
  Administered 2013-10-12: 1000 mL
  Administered 2013-10-12 (×3): 3000 mL

## 2013-10-12 MED ORDER — ONDANSETRON HCL 4 MG/2ML IJ SOLN: 4.0000 mg | INTRAMUSCULAR | Status: DC | PRN

## 2013-10-12 MED ORDER — CIPROFLOXACIN IN D5W 400 MG/200ML IV SOLN: 400.0000 mg | INTRAVENOUS | Status: DC

## 2013-10-12 MED ORDER — NEOSTIGMINE METHYLSULFATE 10 MG/10ML IV SOLN
INTRAVENOUS | Status: AC
  Filled 2013-10-12: qty 1

## 2013-10-12 MED ORDER — FENTANYL CITRATE 0.05 MG/ML IJ SOLN
INTRAMUSCULAR | Status: AC | PRN
  Administered 2013-10-12: 25 ug via INTRAVENOUS

## 2013-10-12 MED ORDER — HYDROMORPHONE HCL PF 1 MG/ML IJ SOLN
0.5000 mg | INTRAMUSCULAR | Status: DC | PRN
  Administered 2013-10-12: 1 mg via INTRAVENOUS
  Filled 2013-10-12: qty 1

## 2013-10-12 MED ORDER — GLYCOPYRROLATE 0.2 MG/ML IJ SOLN
INTRAMUSCULAR | Status: AC
  Filled 2013-10-12: qty 3

## 2013-10-12 MED ORDER — HYDROMORPHONE HCL PF 1 MG/ML IJ SOLN
INTRAMUSCULAR | Status: AC
  Filled 2013-10-12: qty 1

## 2013-10-12 MED ORDER — FENTANYL CITRATE 0.05 MG/ML IJ SOLN
INTRAMUSCULAR | Status: AC
  Filled 2013-10-12: qty 5

## 2013-10-12 MED ORDER — NEOSTIGMINE METHYLSULFATE 10 MG/10ML IV SOLN
INTRAVENOUS | Status: DC | PRN
  Administered 2013-10-12: 5 mg via INTRAVENOUS

## 2013-10-12 MED ORDER — DEXAMETHASONE SODIUM PHOSPHATE 10 MG/ML IJ SOLN
INTRAMUSCULAR | Status: AC
  Filled 2013-10-12: qty 1

## 2013-10-12 MED ORDER — MEMANTINE HCL ER 7 MG PO CP24
28.0000 mg | ORAL_CAPSULE | Freq: Every day | ORAL | Status: DC
  Administered 2013-10-13: 28 mg via ORAL
  Filled 2013-10-12: qty 4

## 2013-10-12 MED ORDER — ONDANSETRON HCL 4 MG/2ML IJ SOLN
INTRAMUSCULAR | Status: AC
  Filled 2013-10-12: qty 2

## 2013-10-12 MED ORDER — SODIUM CHLORIDE 0.9 % IV SOLN
Freq: Once | INTRAVENOUS | Status: AC
  Administered 2013-10-12: 09:00:00 via INTRAVENOUS

## 2013-10-12 MED ORDER — DIPHENHYDRAMINE HCL 12.5 MG/5ML PO ELIX: 12.5000 mg | ORAL_SOLUTION | Freq: Four times a day (QID) | ORAL | Status: DC | PRN

## 2013-10-12 SURGICAL SUPPLY — 48 items
BAG URINE DRAINAGE (UROLOGICAL SUPPLIES) ×3 IMPLANT
BAG URO CATCHER STRL LF (DRAPE) ×3 IMPLANT
BASKET STONE NITINOL 3FRX115MB (UROLOGICAL SUPPLIES) IMPLANT
BASKET ZERO TIP NITINOL 2.4FR (BASKET) IMPLANT
BENZOIN TINCTURE PRP APPL 2/3 (GAUZE/BANDAGES/DRESSINGS) ×6 IMPLANT
CATH COUNCIL 22FR (CATHETERS) ×3 IMPLANT
CATH FOLEY 2W COUNCIL 20FR 5CC (CATHETERS) IMPLANT
CATH INTERMIT  6FR 70CM (CATHETERS) IMPLANT
CATH ROBINSON RED A/P 20FR (CATHETERS) IMPLANT
CATH X-FORCE N30 NEPHROSTOMY (TUBING) ×3 IMPLANT
COVER SURGICAL LIGHT HANDLE (MISCELLANEOUS) ×3 IMPLANT
DRAPE C-ARM 42X120 X-RAY (DRAPES) ×3 IMPLANT
DRAPE CAMERA CLOSED 9X96 (DRAPES) ×3 IMPLANT
DRAPE LINGEMAN PERC (DRAPES) ×3 IMPLANT
DRAPE SURG IRRIG POUCH 19X23 (DRAPES) ×3 IMPLANT
DRAPE UTILITY XL STRL (DRAPES) ×3 IMPLANT
DRSG TEGADERM 8X12 (GAUZE/BANDAGES/DRESSINGS) ×6 IMPLANT
FIBER LASER FLEXIVA 550 (UROLOGICAL SUPPLIES) IMPLANT
GLOVE BIOGEL M STRL SZ7.5 (GLOVE) ×3 IMPLANT
GOWN STRL REUS W/TWL LRG LVL3 (GOWN DISPOSABLE) ×6 IMPLANT
GUIDEWIRE AMPLATZ STIFF 0.35 (WIRE) ×6 IMPLANT
GUIDEWIRE ANG ZIPWIRE 038X150 (WIRE) ×3 IMPLANT
GUIDEWIRE STR DUAL SENSOR (WIRE) ×3 IMPLANT
KIT BASIN OR (CUSTOM PROCEDURE TRAY) ×3 IMPLANT
LASER FIBER DISP 1000U (UROLOGICAL SUPPLIES) IMPLANT
MANIFOLD NEPTUNE II (INSTRUMENTS) ×3 IMPLANT
NS IRRIG 1000ML POUR BTL (IV SOLUTION) IMPLANT
PACK BASIC VI WITH GOWN DISP (CUSTOM PROCEDURE TRAY) ×3 IMPLANT
PACK CYSTO (CUSTOM PROCEDURE TRAY) ×3 IMPLANT
PAD ABD 7.5X8 STRL (GAUZE/BANDAGES/DRESSINGS) ×6 IMPLANT
PROBE LITHOCLAST ULTRA 3.8X403 (UROLOGICAL SUPPLIES) ×3 IMPLANT
PROBE PNEUMATIC 1.0MMX570MM (UROLOGICAL SUPPLIES) ×3 IMPLANT
SET IRRIG Y TYPE TUR BLADDER L (SET/KITS/TRAYS/PACK) ×3 IMPLANT
SET WARMING FLUID IRRIGATION (MISCELLANEOUS) ×3 IMPLANT
SHEATH PEELAWAY SET 9 (SHEATH) ×3 IMPLANT
SPONGE GAUZE 4X4 12PLY (GAUZE/BANDAGES/DRESSINGS) ×3 IMPLANT
SPONGE LAP 4X18 X RAY DECT (DISPOSABLE) ×3 IMPLANT
STENT ENDOURETEROTOMY 7-14 26C (STENTS) IMPLANT
STONE CATCHER W/TUBE ADAPTER (UROLOGICAL SUPPLIES) ×3 IMPLANT
SUT SILK 2 0 30  PSL (SUTURE) ×4
SUT SILK 2 0 30 PSL (SUTURE) ×2 IMPLANT
SYR 20CC LL (SYRINGE) ×6 IMPLANT
SYRINGE 10CC LL (SYRINGE) ×3 IMPLANT
TOWEL OR NON WOVEN STRL DISP B (DISPOSABLE) ×3 IMPLANT
TRAY FOLEY CATH 16FRSI W/METER (SET/KITS/TRAYS/PACK) ×3 IMPLANT
TUBING CONNECTING 10 (TUBING) ×6 IMPLANT
TUBING CONNECTING 10' (TUBING) ×3
WATER STERILE IRR 1500ML POUR (IV SOLUTION) ×3 IMPLANT

## 2013-10-12 NOTE — Discharge Instructions (Signed)
Percutaneous Nephrolithotomy Kidney stones can cause a great deal of pain. They can block urine from leaving the body. And they can lead to infection. Kidney stones might pass on their own, or they can be broken up by shock waves from a special machine. But sometimes surgery is needed to get rid of kidney stones. One type of surgery is called percutaneous nephrolithotomy. "Nephro" means kidney, "litho" means stone and "tomy" means removal by surgery. "Percutaneous" means through the skin. This type of surgery needs only a small cut (incision) in the skin. This surgery may be suggested for various reasons. They include:  The kidney stones are 2 centimeters wide (about 3/4 inch) or bigger. They also might be oddly shaped.  Other treatments were tried, but all or some of the kidney stones remain.  Infection has developed.  No other treatment can be used. LET YOUR CAREGIVER KNOW ABOUT:   Any allergies.  All medications you are taking, including:  Herbs, eyedrops, over-the-counter medications and creams.  Blood thinners (anticoagulants), aspirin or other drugs that could affect blood clotting.  Use of steroids (by mouth or as creams).  Previous problems with anesthetics, including local anesthetics.  Possibility of pregnancy, if this applies.  Any history of blood clots.  Any history of bleeding or other blood problems.  Previous surgery.  Smoking history.  Other health problems. RISKS AND COMPLICATIONS   During surgery:  Sometimes all the kidney stones cannot be removed through the tube. Then, the percutaneous nephrolithotomy procedure would be stopped. Open surgery would be used to remove the remaining stones.  Short-term risks from the surgery could include:  Excessive bleeding.  Blood in your urine.  Holes in the kidney. These usually heal on their own.  Pain.  Redness or tenderness at the incision site.  Numbness (loss of feeling) in the area treated. Tingling  also is possible.  A pooling of blood in the wound (hematoma).  Infection.  Slow healing.  Longer-term possibilities include:  Kidney damage.  Damage to organs near the kidney.  Need for a repeat surgery. BEFORE THE PROCEDURE  You may need to take some tests before your surgery. These might include:  Blood tests.  Urine tests.  Tests to make sure your heart is working properly.  Let your healthcare provider know if you think you might have a urinary tract infection. You will probably need to take an antibiotic to treat this before the surgery.  Two weeks before your surgery, stop using aspirin and non-steroidal anti-inflammatory drugs (NSAIDs) for pain relief. This includes prescription drugs and over-the-counter drugs such as ibuprofen and naproxen. Also stop taking vitamin E.  If you take blood-thinners, ask your healthcare provider when you should stop taking them.  Do not eat or drink for about 8 hours before your surgery.  You might be asked to shower or wash with a special antibacterial soap before the procedure.  Arrive at least an hour before the surgery, or whenever your surgeon recommends. This will give you time to check in and fill out any needed paperwork. PROCEDURE  The preparation:  You will change into a hospital gown.  You will be given an IV. A needle will be inserted in your arm. Medication will be able to flow directly into your body through this needle.  You might be given a sedative. This medication will help you relax.  You may be given a general anesthetic (a drug that will put you to sleep during the surgery). Or, you may  get a local anesthetic (part of your body will be numb, but you will remain awake).  A catheter (tube) will be put in your bladder to drain urine during and after surgery.  The procedure:  The surgeon will make a small incision in your lower back.  A tube will be inserted through the incision into your kidney.  Each  kidney stone is removed through this tube. Larger stones may first be broken up with a laser (a high-intensity light beam) or other tools.  If a kidney stone has already left the kidney, the surgeon would use a special tool to bring it back in. Then it would be removed through the tube.  After all the stones are taken out, a catheter will be put in. Fluid can build up around the kidney as it heals. The catheter lets this fluid drain out of the body.  A dressing (medicine and bandage) will be put on the incision area.  The surgery usually takes three to four hours. AFTER THE PROCEDURE  You will stay in a recovery area until the anesthesia has worn off. Your blood pressure and pulse will be checked often. You might be given more pain medication.  You will be moved to a hospital room for the rest of your stay.  The catheter that is taking urine out of your body will be taken out within 24 hours.  The day after your surgery, you should be able to walk around. Walking helps prevent blood clots (thick clumps that can block the flow of blood).  You may be asked to do some breathing exercises.  You will be able to have only liquids for a day or two.  Before you go home:  The catheter that is draining fluid from the kidney area is usually taken out.  You will be taught how to care for the incision. Be sure to ask how often the dressing should be changed and when it can get wet.  You also will be told what you should and should not do while your kidney and incisions heal. For instance, you may be urged to walk to prevent blood clots. Document Released: 03/29/2009 Document Revised: 08/24/2011 Document Reviewed: 03/29/2009 [facility] Patient Information 2014 [place], [place].

## 2013-10-12 NOTE — Progress Notes (Signed)
Pt with large blood clots in his left nephrostomy tubing. Milked clots down into bag to ensure urine was still able to flow out. Called Dr. Karsten Ro on call to make aware, no orders received.

## 2013-10-12 NOTE — Procedures (Signed)
Placement of left nephroureteral catheter from lower pole approach with [place] and fluoro guidance.  Sample of fluid from the collecting system was sent for culture.  No immediate complication.

## 2013-10-12 NOTE — Transfer of Care (Signed)
Immediate Anesthesia Transfer of Care Note  Patient: [name] [name]  Procedure(s) Performed: Procedure(s): NEPHROLITHOTOMY PERCUTANEOUS (Left)  Patient Location: PACU  Anesthesia Type:General  Level of Consciousness: Patient easily awoken, sedated, comfortable, cooperative, following commands, responds to stimulation.   Airway & Oxygen Therapy: Patient spontaneously breathing, ventilating well, oxygen via simple oxygen mask.  Post-op Assessment: Report given to PACU RN, vital signs reviewed and stable, moving all extremities.   Post vital signs: Reviewed and stable.  Complications: No apparent anesthesia complications

## 2013-10-12 NOTE — Discharge Instructions (Signed)
1. You may see some blood in the urine and may have some burning with urination for 48-72 hours. You also may notice that you have to urinate more frequently or urgently after your procedure which is normal.  °2. You should call should you develop an inability urinate, fever > 101, persistent nausea and vomiting that prevents you from eating or drinking to stay hydrated.  °

## 2013-10-12 NOTE — Anesthesia Preprocedure Evaluation (Signed)
Anesthesia Evaluation  Patient identified by MRN, date of birth, ID band Patient awake    Reviewed: Allergy & Precautions, H&P , NPO status , Patient's Chart, lab work & pertinent test results  Airway Mallampati: II TM Distance: >3 FB Neck ROM: Full    Dental no notable dental hx.    Pulmonary neg pulmonary ROS,  breath sounds clear to auscultation  Pulmonary exam normal       Cardiovascular Exercise Tolerance: Good hypertension, Pt. on medications and Pt. on home beta blockers Rhythm:Regular Rate:Normal     Neuro/Psych PSYCHIATRIC DISORDERS negative neurological ROS     GI/Hepatic negative GI ROS, Neg liver ROS,   Endo/Other  diabetes, Type 2, Oral Hypoglycemic Agents  Renal/GU negative Renal ROS  negative genitourinary   Musculoskeletal negative musculoskeletal ROS (+)   Abdominal   Peds negative pediatric ROS (+)  Hematology negative hematology ROS (+)   Anesthesia Other Findings   Reproductive/Obstetrics negative OB ROS                           Anesthesia Physical Anesthesia Plan  ASA: III  Anesthesia Plan: General   Post-op Pain Management:    Induction: Intravenous  Airway Management Planned: Oral ETT  Additional Equipment:   Intra-op Plan:   Post-operative Plan: Extubation in OR  Informed Consent: I have reviewed the patients History and Physical, chart, labs and discussed the procedure including the risks, benefits and alternatives for the proposed anesthesia with the patient or authorized representative who has indicated his/her understanding and acceptance.   Dental advisory given  Plan Discussed with: CRNA  Anesthesia Plan Comments:         Anesthesia Quick Evaluation

## 2013-10-12 NOTE — Progress Notes (Signed)
Patient ID: [name] [name], male   DOB: [birth year], 73 y.o.   MRN: [id number]  Post-op note  Subjective: The patient is doing well.  No complaints except bladder spasms.  Objective: Vital signs in last 24 hours: Temp:  [97.7 F (36.5 C)-98.7 F (37.1 C)] 98.7 F (37.1 C) (04/30 1614) Pulse Rate:  [59-87] 60 (04/30 1614) Resp:  [12-24] 14 (04/30 1614) BP: (131-159)/(62-90) 138/62 mmHg (04/30 1614) SpO2:  [98 %-100 %] 98 % (04/30 1614) Weight:  [91 kg (200 lb 9.9 oz)] 91 kg (200 lb 9.9 oz) (04/30 1614)  Intake/Output from previous day:   Intake/Output this shift: Total I/O In: 1100 [I.V.:1100] Out: 400 [Urine:400]  Physical Exam:  General: Alert and oriented. Abdomen: Soft, Nondistended. Back: Urine draining well from nephrostomy tube.  Lab Results:  Recent Labs  10/12/13 1428  HGB 13.7  HCT 38.3*    Assessment/Plan: POD#0   1) Continue to monitor 2) B & O suppository if needed 3) CT scan of abdomen without contrast tonight to determine if any residual stone fragments are present   [name] [name]. MD   LOS: 0 days   [name] [name] 10/12/2013, 5:55 PM

## 2013-10-12 NOTE — H&P (Signed)
Chief Complaint: "We're here for a kidney stone" Referring Physician:Borden HPI: [name] [name] is an 73 y.o. male with prior history of bladder cancer but is currently being treated for large left renal calculus. He has already had a (L)ureteral stent placed, but after seeing Dr. Alinda Money is now scheduled for (L)PCNL procedure. He is here for (L)perc nephroureteral catheter placement. Pt has dementia and wife is at bedside to provide history. PMHx and meds reviewed.  Past Medical History:  Past Medical History  Diagnosis Date  . Diabetes mellitus 12-11-11    oral meds only  . Hypertension 12-11-11    controlled with meds  . Dementia 12-11-11    memory short term(steadily progressive worsening-pt. unaware) .dx. Alzheimers  . Hypercholesterolemia   . Cancer 12-11-11    Bladder cancer -dx. '07-surgery for tumor removal   / skin cancer,multiple surgeries to tx.  [name] UTI (urinary tract infection) 08-09-13    multiple, last tx. 1 week ago  . Hearing loss     has one hearing aid, doesn't wear  . BPH (benign prostatic hypertrophy)     Past Surgical History:  Past Surgical History  Procedure Laterality Date  . Cataract extraction, bilateral      bil.  LEFT EYE 08-20-09    RT EYE 08-27-09  . Bladder surgery  12-11-11    2007-tumor removal -stent placed and removed.Lavell Islam  '08 for stone  . Lithotripsy  12-11-11    '05/ '10  . Subdural hemorrhage drainage  12-11-11    drainage per boreholes only 12-12-03  . Tonsillectomy  12-11-11    child  . Transurethral resection of bladder tumor  12/21/2011    Procedure: TRANSURETHRAL RESECTION OF BLADDER TUMOR (TURBT);  Surgeon: [name], MD;  Location: [facility];  Service: Urology;  Laterality: N/A;     . Cystoscopy w/ retrogrades  12/21/2011    Procedure: CYSTOSCOPY WITH RETROGRADE PYELOGRAM;  Surgeon: [name], MD;  Location: [facility];  Service: Urology;  Laterality: Bilateral;  . Cystoscopy with biopsy  12/21/2011    Procedure: CYSTOSCOPY WITH BIOPSY;  Surgeon:  [name], MD;  Location: [facility];  Service: Urology;;  . Cystoscopy w/ retrogrades Bilateral 08/14/2013    Procedure: CYSTOSCOPY WITH BILATERAL  RETROGRADE PYELOGRAM/LEFT DIGITAL FLEXIBLE URETEROSCOPY/INSERTION LEFT URETERAL STENT;  Surgeon: [name], MD;  Location: [facility];  Service: Urology;  Laterality: Bilateral;  . Transurethral resection of bladder tumor N/A 08/14/2013    Procedure: TRANSURETHRAL RESECTION OF BLADDER TUMOR (TURBT);  Surgeon: [name], MD;  Location: [facility];  Service: Urology;  Laterality: N/A;    Family History: No family history on file.  Social History:  reports that he has never smoked. He has never used smokeless tobacco. He reports that he does not drink alcohol or use illicit drugs.  Allergies:  Allergies  Allergen Reactions  . Tetracyclines & Related Rash  . Aricept [Donepezil Hcl] Nausea And Vomiting    Nausea and vomiting  . Sulfa Antibiotics Other (See Comments)    unknown    Medications: Avodart 0.5mg  daily, HCTZ 25mg  daily, Namenda 28mg  daily, Glucophage XR 1000mg  bid, Toprol XL 25mg  daily, Aceon 4mg  daily, Uroctit-K 23meq bid, Onglyza 5mg  daily  Please HPI for pertinent positives, otherwise complete 10 system ROS negative.  Physical Exam: BP 131/65  Pulse 74  Temp(Src) 97.7 F (36.5 C) (Oral)  Resp 16  SpO2 100% There is no weight on file to calculate BMI.   General Appearance:  Alert, cooperative, no distress, appears stated  age  Head:  Normocephalic, without obvious abnormality, atraumatic  ENT: Unremarkable  Neck: Supple, symmetrical, trachea midline  Lungs:   Clear to auscultation bilaterally, no w/r/r, respirations unlabored without use of accessory muscles.  Chest Wall:  No tenderness or deformity  Heart:  Regular rate and rhythm, S1, S2 normal, no murmur, rub or gallop.  Neurologic: Normal affect, no gross deficits.   Results for orders placed during the hospital encounter of 10/12/13 (from the past 48 hour(s))  TYPE AND SCREEN      Status: None   Collection Time    10/12/13  8:15 AM      Result Value Ref Range   ABO/RH(D) O POS     Antibody Screen PENDING     Sample Expiration 10/15/2013     CBC    Component Value Date/Time   WBC 9.2 09/28/2013 1020   RBC 4.80 09/28/2013 1020   HGB 13.5 09/28/2013 1020   HCT 39.5 09/28/2013 1020   PLT 232 09/28/2013 1020   MCV 82.3 09/28/2013 1020   MCH 28.1 09/28/2013 1020   MCHC 34.2 09/28/2013 1020   RDW 13.3 09/28/2013 1020   LYMPHSABS 0.8 10/12/2008 0455   MONOABS 1.5* 10/12/2008 0455   EOSABS 0.1 10/12/2008 0455   BASOSABS 0.0 10/12/2008 0455      Assessment/Plan (L)renal calculus For IR placement of (L)PCN prior to PCNL by Dr. Alinda Money. Discussed IR part of procedure, risks, complications, use of sedation. Labs pending Consent signed in chart  Ascencion Dike PA-C 10/12/2013, 8:45 AM

## 2013-10-12 NOTE — Op Note (Signed)
Preoperative diagnosis:  1. Left renal calculus ( 3.5 cm)  Postoperative diagnosis: 1. Left renal calculus ( 3.5 cm)  Procedure(s): 1. Left percutaneous nephrolithotomy ( first stage) , greater than 2 cm 2.  Dilation of percutaneous nephrostomy tract  Surgeon: Dr. Roxy Horseman, Jr  Anesthesia: General  Complications: None  EBL: Less than 50 cc  Specimens: Left renal calculi  Disposition of specimens: [facility]  Indication: [name] [name] is a 73 year old gentleman who was incidentally found to have a large 3.5 cm left renal calculus on retrograde pyelography during evaluation for bladder cancer.  He has been noted to have recurrent bacteriuria indicating that this may represent a struvite stone.  He has been treated with preoperative antibiotics based on his prior urine cultures.  We have reviewed the potential risks and complications associated with the above procedure as well as the expected recovery process.  He gives his consent to proceed as planned.  Description of procedure:  The patient was taken to interventional radiology earlier today and percutaneous access through the left lower pole calyx posteriorly was obtained.  This was performed uneventfully.  The patient was then taken to the operating room and a general anesthetic was administered.  He was placed in the prone position, prepped in the usual sterile fashion, and administered preoperative antibiotics with ciprofloxacin as well as cefazolin and based on preoperative urine cultures.  He had been treated preoperatively with cephalexin for multiple days based on his culture.  Utilizing real-time fluoroscopy, I placed a stiff Amplatz wire through his angiographic catheter with the tip secured in the bladder.  The angiocatheter was then removed and a coaxial catheter was placed over the initial wire and positioned near the ureteropelvic junction.  I then used a Glidewire which was placed through the coaxial  catheter down the ureter and into the bladder.  The coaxial catheter was then removed and the angiographic catheter was placed back over the Glidewire.  This was inserted down to the bladder and the Glidewire was exchanged for a another stiff Amplatz wire.  One wire was used as a Chiropodist and the other as a working wire.  I then inserted the balloon dilator over the working wire until its tip was just within the lateral aspect of the posterior lower pole calyx.  The balloon was then dilated to 30 [place] and left inflated.  I then placed the nephrostomy sheath over the balloon and positioned the tip just within the renal collecting system.  The balloon was deflated and removed.  I then utilized the nephroscope to visualize the renal collecting system.  Bleeding was relatively minimal.  I identified the large renal pelvic stone immediately.  Using a combination of ultrasonic and pneumatic lithotripsy, the stone was fragmented and removed.  Multiple large fragments were also removed with the 3 prong grasper.  I was eventually able to render the patient visually stone free.  I inspected the entire renal collecting system until no further fragments were identified.  I did try to ensure that the patient was completely stone free considering the fact that these were likely struvite stones.  Therefore, once all stone fragments were visually removed, I placed a 22 [place] council tip catheter over the wire into the renal pelvis.  I also placed an angiographic catheter down over the safety wire into the bladder to maintain ureteral access.  Contrast was injected to ensure appropriate position and approximately 1.5 cc of sterile water was used to secure the nephrostomy  catheter in place the balloon inflated.  No filling defects were seen within the renal pelvis on contrast injection.  Although it was felt that the patient was most likely rendered stone free, considering the concern for a struvite stone, I have decided to  proceed with a ET stone study tomorrow.  If there are residual fragments, we will discuss a possible second look procedure next week.  The patient tolerated the procedure well without complications.  He was able to be extubated and transferred to recovery unit in satisfactory condition.

## 2013-10-12 NOTE — Anesthesia Postprocedure Evaluation (Signed)
  Anesthesia Post-op Note  Patient: [name] [name]  Procedure(s) Performed: Procedure(s) (LRB): NEPHROLITHOTOMY PERCUTANEOUS (Left)  Patient Location: PACU  Anesthesia Type: General  Level of Consciousness: awake and alert   Airway and Oxygen Therapy: Patient Spontanous Breathing  Post-op Pain: mild  Post-op Assessment: Post-op Vital signs reviewed, Patient's Cardiovascular Status Stable, Respiratory Function Stable, Patent Airway and No signs of Nausea or vomiting  Last Vitals:  Filed Vitals:   10/12/13 1614  BP: 138/62  Pulse: 60  Temp: 37.1 C  Resp: 14    Post-op Vital Signs: stable   Complications: No apparent anesthesia complications

## 2013-10-13 ENCOUNTER — Encounter (HOSPITAL_COMMUNITY): Payer: Self-pay | Admitting: *Deleted

## 2013-10-13 DIAGNOSIS — N200 Calculus of kidney: Secondary | ICD-10-CM | POA: Diagnosis not present

## 2013-10-13 LAB — CBC
HEMATOCRIT: 36.3 % — AB (ref 39.0–52.0)
Hemoglobin: 12.8 g/dL — ABNORMAL LOW (ref 13.0–17.0)
MCH: 28.4 pg (ref 26.0–34.0)
MCHC: 35.3 g/dL (ref 30.0–36.0)
MCV: 80.7 fL (ref 78.0–100.0)
PLATELETS: 216 10*3/uL (ref 150–400)
RBC: 4.5 MIL/uL (ref 4.22–5.81)
RDW: 12.8 % (ref 11.5–15.5)
WBC: 17.9 10*3/uL — ABNORMAL HIGH (ref 4.0–10.5)

## 2013-10-13 LAB — BASIC METABOLIC PANEL
BUN: 14 mg/dL (ref 6–23)
CHLORIDE: 100 meq/L (ref 96–112)
CO2: 26 mEq/L (ref 19–32)
CREATININE: 0.91 mg/dL (ref 0.50–1.35)
Calcium: 8.6 mg/dL (ref 8.4–10.5)
GFR calc non Af Amer: 84 mL/min — ABNORMAL LOW (ref >90)
Glucose, Bld: 190 mg/dL — ABNORMAL HIGH (ref 70–99)
Potassium: 4 mEq/L (ref 3.7–5.3)
Sodium: 138 mEq/L (ref 137–147)

## 2013-10-13 LAB — GLUCOSE, CAPILLARY
GLUCOSE-CAPILLARY: 204 mg/dL — AB (ref 70–99)
Glucose-Capillary: 175 mg/dL — ABNORMAL HIGH (ref 70–99)
Glucose-Capillary: 187 mg/dL — ABNORMAL HIGH (ref 70–99)
Glucose-Capillary: 244 mg/dL — ABNORMAL HIGH (ref 70–99)

## 2013-10-13 MED ORDER — INSULIN ASPART 100 UNIT/ML ~~LOC~~ SOLN
0.0000 [IU] | SUBCUTANEOUS | Status: DC
  Administered 2013-10-13: 5 [IU] via SUBCUTANEOUS
  Administered 2013-10-13: 3 [IU] via SUBCUTANEOUS

## 2013-10-13 NOTE — Progress Notes (Signed)
Patient ID: [name] [name], male   DOB: 1941/03/28, 73 y.o.   MRN: [id number]  1 Day Post-Op Subjective: Pt doing well.  No complaints. Pain controlled.  Objective: Vital signs in last 24 hours: Temp:  [98.1 F (36.7 C)-99.1 F (37.3 C)] 98.4 F (36.9 C) (05/01 0457) Pulse Rate:  [59-87] 76 (05/01 0457) Resp:  [12-24] 14 (05/01 0457) BP: (115-159)/(58-90) 115/58 mmHg (05/01 0457) SpO2:  [98 %-100 %] 99 % (05/01 0457) Weight:  [91 kg (200 lb 9.9 oz)] 91 kg (200 lb 9.9 oz) (04/30 1614)  Intake/Output from previous day: 04/30 0701 - 05/01 0700 In: 1986.7 [I.V.:1736.7; IV Piggyback:250] Out: 1750 [Urine:1750] Intake/Output this shift:    Physical Exam:  General: Alert and oriented CV: RRR Lungs: Clear Abdomen: Soft, ND, L PCN draining well (mostly clear) Ext: NT, No erythema  Lab Results:  Recent Labs  10/12/13 1428 10/13/13 0423  HGB 13.7 12.8*  HCT 38.3* 36.3*   BMET  Recent Labs  10/13/13 0423  NA 138  K 4.0  CL 100  CO2 26  GLUCOSE 190*  BUN 14  CREATININE 0.91  CALCIUM 8.6     Studies/Results: CT of abdomen last night reviewed independently: No residual left renal calculi seen.  Final report is pending.  Assessment/Plan: - Removed PCN and monitored.  No signs of bleeding or complications. Safety catheter also removed. - D/C Foley - D/C home on cephalexin considering concern of infection stone   LOS: 1 day   [name] [name] 10/13/2013, 7:38 AM

## 2013-10-13 NOTE — Discharge Summary (Signed)
  Date of admission: 10/12/2013  Date of discharge: 10/13/2013  Admission diagnosis: Left renal calculus (3.5 cm)  Discharge diagnosis: Left renal calculus  Secondary diagnoses: Diabetes  History and Physical: For full details, please see admission history and physical. Briefly, [name] [name] is a 73 y.o. year old patient with a large 3.5 cm left renal calculus.   Hospital Course: He underwent left PCNL for definitive treatment of his large left renal calculus.  This procedure was uncomplicated. A CT demonstrated no residual calculi and his nephrostomy tube was removed on POD#1.  Laboratory values:  Recent Labs  10/12/13 1428 10/13/13 0423  HGB 13.7 12.8*  HCT 38.3* 36.3*    Recent Labs  10/13/13 0423  CREATININE 0.91    Disposition: Home  Discharge instruction: The patient was instructed to be ambulatory but told to refrain from heavy lifting, strenuous activity, or driving.   Discharge medications:    Medication List         Cephalexin 500 MG tablet  Take 1 tablet (500 mg total) by mouth 3 (three) times daily.     cephALEXin 500 MG capsule  Commonly known as:  KEFLEX  Take 1 capsule (500 mg total) by mouth 3 (three) times daily.     docusate sodium 100 MG capsule  Commonly known as:  COLACE  Take 1 capsule (100 mg total) by mouth 2 (two) times daily.     dutasteride 0.5 MG capsule  Commonly known as:  AVODART  Take 0.5 mg by mouth every morning.     FISH OIL PO  Take 1 capsule by mouth daily.     hydrochlorothiazide 25 MG tablet  Commonly known as:  HYDRODIURIL  Take 25 mg by mouth every morning.     HYDROcodone-acetaminophen 5-325 MG per tablet  Commonly known as:  NORCO/VICODIN  Take 1-2 tablets by mouth every 6 (six) hours as needed.     metFORMIN 500 MG 24 hr tablet  Commonly known as:  GLUCOPHAGE-XR  Take 1,000 mg by mouth 2 (two) times daily.     metoprolol succinate 25 MG 24 hr tablet  Commonly known as:  TOPROL-XL  Take 25 mg by mouth  every morning.     NAMENDA XR 28 MG Cp24  Generic drug:  Memantine HCl ER  Take 28 mg by mouth every morning.     ONGLYZA 5 MG Tabs tablet  Generic drug:  saxagliptin HCl  Take 5 mg by mouth daily.     perindopril 4 MG tablet  Commonly known as:  ACEON  Take 4 mg by mouth every morning.     potassium citrate 10 MEQ (1080 MG) SR tablet  Commonly known as:  UROCIT-K  Take 10 mEq by mouth 2 (two) times daily.     VITAMIN B-12 PO  Take 1 tablet by mouth daily.     VITAMIN E PO  Take 1 capsule by mouth daily.        Followup:      Follow-up Information   Follow up with [name], MD. (10/24/13 at 10:45 AM)    Specialty:  Urology   Contact information:   [facility]  [place] [address] [phone]

## 2013-10-13 NOTE — Progress Notes (Signed)
UR Completed.  Amory Zbikowski Jane Kyl Givler [phone] 10/13/2013  

## 2013-10-16 LAB — URINE CULTURE: Colony Count: >=100000

## 2013-10-24 DIAGNOSIS — N200 Calculus of kidney: Secondary | ICD-10-CM | POA: Diagnosis not present

## 2013-11-30 ENCOUNTER — Ambulatory Visit (INDEPENDENT_AMBULATORY_CARE_PROVIDER_SITE_OTHER): Payer: Medicare Other | Admitting: Neurology

## 2013-11-30 ENCOUNTER — Encounter: Payer: Self-pay | Admitting: Neurology

## 2013-11-30 VITALS — BP 132/80 | HR 72 | Ht 71.0 in | Wt 199.0 lb

## 2013-11-30 DIAGNOSIS — I10 Essential (primary) hypertension: Secondary | ICD-10-CM

## 2013-11-30 DIAGNOSIS — R413 Other amnesia: Secondary | ICD-10-CM

## 2013-11-30 DIAGNOSIS — E119 Type 2 diabetes mellitus without complications: Secondary | ICD-10-CM | POA: Diagnosis not present

## 2013-11-30 MED ORDER — DONEPEZIL HCL 10 MG PO TABS: ORAL_TABLET | ORAL | Status: DC

## 2013-11-30 MED ORDER — MEMANTINE HCL ER 28 MG PO CP24: 28.0000 mg | ORAL_CAPSULE | Freq: Every morning | ORAL | Status: DC

## 2013-11-30 NOTE — Progress Notes (Signed)
[facility]  PATIENT: [name] [name] DOB: 1940/11/02   REASON FOR VISIT: Follow  up for memory   HISTORY OF PRESENT ILLNESS:  [name] [name] 73 years old right-handed Caucasian male, accompanied by his wife, referred by his primary care physician Dr. Wilson Singer for evaluation of memory loss He had a past medical history of diabetes, hypertension, bladder cancer, kidney stone, hyperlipidemia,  He had 16 years of education, was a trained Office manager, as his small business as Psychologist, clinical since retired in 2004. He used to be very active at church Over the past 3 years, he was noticed to have mild short-term memory trouble, he tends to misplace things, he can no longer fix things, which he used to be very good at, he tends to sit in front of TV, but confused about how to changeTV stations.  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   He gets frustrated and angry easily, there was no significant visual difficulty, no gait difficulty, he still driving short distance, not getting loss,  Laboratory evaluation July 04 2012, A1c 8.4, LDL 90, normal CBC, CMP with exception of elevated glucose 216, vitamin B12 254  his father has Alzheimer's disease, his first cousin also has Alzheimer's disease  he was referred for cardiac evaluation in 2014, because of tachycardia, EKG showed normal sinus rhythm at 87 beats per minute, echocardiogram was normal, MRI of the brain showed mild perisylvian atrophy, moderate prominence of the subarachnoid space over the bifrontal, and the biparietal region, mild periventricular nonspecific goliosis. Laboratory showed mildly low vitamin B12 256, normal methylmalonic acid level, folic acid, homocystine level, he is on by mouth vitamin B12 supplement  UPDATE June 2015:  He stays about the same, wife drives most of the time now, because he is confused about direction, he is taking Namenda 28 mg every day, previously Aricept caused significant GI side  effect  REVIEW OF SYSTEMS: Full 14 system review of systems performed and notable only for:  Confusion, memory trouble,   ALLERGIES: Allergies  Allergen Reactions  . Tetracyclines & Related Rash  . Aricept [Donepezil Hcl] Nausea And Vomiting    Nausea and vomiting  . Sulfa Antibiotics Other (See Comments)    unknown    HOME MEDICATIONS: Outpatient Prescriptions Prior to Visit  Medication Sig Dispense Refill  . cephALEXin (KEFLEX) 500 MG capsule Take 1 capsule (500 mg total) by mouth 3 (three) times daily.  50 capsule  0  . Cephalexin 500 MG tablet Take 1 tablet (500 mg total) by mouth 3 (three) times daily.  6 tablet  0  . Cyanocobalamin (VITAMIN B-12 PO) Take 1 tablet by mouth daily.      [name] docusate sodium (COLACE) 100 MG capsule Take 1 capsule (100 mg total) by mouth 2 (two) times daily.  30 capsule  0  . dutasteride (AVODART) 0.5 MG capsule Take 0.5 mg by mouth every morning.      . hydrochlorothiazide (HYDRODIURIL) 25 MG tablet Take 25 mg by mouth every morning.      [name] HYDROcodone-acetaminophen (NORCO/VICODIN) 5-325 MG per tablet Take 1-2 tablets by mouth every 6 (six) hours as needed.  30 tablet  0  . Memantine HCl ER (NAMENDA XR) 28 MG CP24 Take 28 mg by mouth every morning.      . metFORMIN (GLUCOPHAGE-XR) 500 MG 24 hr tablet Take 1,000 mg by mouth 2 (two) times daily.      . metoprolol succinate (TOPROL-XL) 25 MG 24 hr tablet  Take 25 mg by mouth every morning.       . Omega-3 Fatty Acids (FISH OIL PO) Take 1 capsule by mouth daily.      . perindopril (ACEON) 4 MG tablet Take 4 mg by mouth every morning.      . potassium citrate (UROCIT-K) 10 MEQ (1080 MG) SR tablet Take 10 mEq by mouth 2 (two) times daily.       . saxagliptin HCl (ONGLYZA) 5 MG TABS tablet Take 5 mg by mouth daily.      [name] VITAMIN E PO Take 1 capsule by mouth daily.       No facility-administered medications prior to visit.    PAST MEDICAL HISTORY: Past Medical History  Diagnosis Date  . Diabetes  mellitus 12-11-11    oral meds only  . Hypertension 12-11-11    controlled with meds  . Dementia 12-11-11    memory short term(steadily progressive worsening-pt. unaware) .dx. Alzheimers  . Hypercholesterolemia   . Cancer 12-11-11    Bladder cancer -dx. '07-surgery for tumor removal   / skin cancer,multiple surgeries to tx.  [name] UTI (urinary tract infection) 08-09-13    multiple, last tx. 1 week ago  . Hearing loss     has one hearing aid, doesn't wear  . BPH (benign prostatic hypertrophy)   . Renal calculus, left 10/12/2013    PAST SURGICAL HISTORY: Past Surgical History  Procedure Laterality Date  . Cataract extraction, bilateral      bil.  LEFT EYE 08-20-09    RT EYE 08-27-09  . Bladder surgery  12-11-11    2007-tumor removal -stent placed and removed.Lavell Islam  '08 for stone  . Lithotripsy  12-11-11    '05/ '10  . Subdural hemorrhage drainage  12-11-11    drainage per boreholes only 12-12-03  . Tonsillectomy  12-11-11    child  . Transurethral resection of bladder tumor  12/21/2011    Procedure: TRANSURETHRAL RESECTION OF BLADDER TUMOR (TURBT);  Surgeon: [name], MD;  Location: [facility];  Service: Urology;  Laterality: N/A;     . Cystoscopy w/ retrogrades  12/21/2011    Procedure: CYSTOSCOPY WITH RETROGRADE PYELOGRAM;  Surgeon: [name], MD;  Location: [facility];  Service: Urology;  Laterality: Bilateral;  . Cystoscopy with biopsy  12/21/2011    Procedure: CYSTOSCOPY WITH BIOPSY;  Surgeon: [name], MD;  Location: [facility];  Service: Urology;;  . Cystoscopy w/ retrogrades Bilateral 08/14/2013    Procedure: CYSTOSCOPY WITH BILATERAL  RETROGRADE PYELOGRAM/LEFT DIGITAL FLEXIBLE URETEROSCOPY/INSERTION LEFT URETERAL STENT;  Surgeon: [name], MD;  Location: [facility];  Service: Urology;  Laterality: Bilateral;  . Transurethral resection of bladder tumor N/A 08/14/2013    Procedure: TRANSURETHRAL RESECTION OF BLADDER TUMOR (TURBT);  Surgeon: [name], MD;  Location: [facility];  Service: Urology;  Laterality:  N/A;  . Nephrolithotomy Left 10/12/2013    Procedure: NEPHROLITHOTOMY PERCUTANEOUS;  Surgeon: [name], MD;  Location: [facility];  Service: Urology;  Laterality: Left;    FAMILY HISTORY: No family history on file.  SOCIAL HISTORY: History   Social History  . Marital Status: Married    Spouse Name: [name]    Number of Children: 2  . Years of Education: N/A   Occupational History  . retired.   Social History Main Topics  . Smoking status: Never Smoker   . Smokeless tobacco: Never Used  . Alcohol Use: No  . Drug Use: No  . Sexual Activity: Not on file   Other  Topics Concern  . Not on file   Social History Narrative  . No narrative on file     PHYSICAL EXAM  Generalized: Well developed, in no acute distress  Head: normocephalic and atraumatic,. Oropharynx benign  Neck: Supple, no carotid bruits  Cardiac: Regular rate rhythm, no murmur  Musculoskeletal: No deformity   Neurological examination   Mentation: Alert  MMSE 26/30 missing items in orientation and 1 of 3 recall, not oriented to time and place.  Follows all commands speech and language fluent  Cranial nerve II-XII: Fundoscopic exam reveals sharp disc margins.Pupils were equal round reactive to light extraocular movements were full, visual field were full on confrontational test. Facial sensation and strength were normal. hearing was intact to finger rubbing bilaterally. Uvula tongue midline. head turning and shoulder shrug and were normal and symmetric.Tongue protrusion into cheek strength was normal. Motor: normal bulk and tone, full strength in the BUE, BLE, fine finger movements normal, no pronator drift. No focal weakness Coordination: finger-nose-finger, heel-to-shin bilaterally, no dysmetria Reflexes: Brachioradialis 2/2, biceps 2/2, triceps 2/2, patellar 2/2, Achilles 2/2, plantar responses were flexor bilaterally. Gait and Station: Rising up from seated position without assistance, normal stance, moderate  stride, good arm swing, smooth turning, able to perform tiptoe, and heel walking without difficulty.   DIAGNOSTIC DATA (LABS, IMAGING, TESTING) -None to review   ASSESSMENT AND PLAN  73 y.o. year old male  has a past medical history of Diabetes mellitus (12-11-11); Hypertension (12-11-11); Dementia (12-11-11); Hypercholesterolemia; Cancer (12-11-11); UTI (urinary tract infection) (08-09-13); Hearing loss; BPH (benign prostatic hypertrophy); and Renal calculus, left (10/12/2013). here for followup. He was unable to tolerate Aricept previously due to nausea and vomiting. He remains on Namenda XR 28 mg daily.  I also restarted him on Aricept starting at very low dose, half tablets, if he can tolerate, titrating to 10 mg each day in combination with Namenda 28 mg daily, continue moderate exercise, he will only return to clinic for new issues  [facility] [address] [place], [place] [address] [phone]

## 2013-12-20 DIAGNOSIS — C679 Malignant neoplasm of bladder, unspecified: Secondary | ICD-10-CM | POA: Diagnosis not present

## 2014-01-01 DIAGNOSIS — Z125 Encounter for screening for malignant neoplasm of prostate: Secondary | ICD-10-CM | POA: Diagnosis not present

## 2014-01-01 DIAGNOSIS — Z79899 Other long term (current) drug therapy: Secondary | ICD-10-CM | POA: Diagnosis not present

## 2014-01-01 DIAGNOSIS — I10 Essential (primary) hypertension: Secondary | ICD-10-CM | POA: Diagnosis not present

## 2014-01-01 DIAGNOSIS — E119 Type 2 diabetes mellitus without complications: Secondary | ICD-10-CM | POA: Diagnosis not present

## 2014-01-01 DIAGNOSIS — E789 Disorder of lipoprotein metabolism, unspecified: Secondary | ICD-10-CM | POA: Diagnosis not present

## 2014-01-01 DIAGNOSIS — N400 Enlarged prostate without lower urinary tract symptoms: Secondary | ICD-10-CM | POA: Diagnosis not present

## 2014-01-01 DIAGNOSIS — IMO0001 Reserved for inherently not codable concepts without codable children: Secondary | ICD-10-CM | POA: Diagnosis not present

## 2014-01-29 DIAGNOSIS — L578 Other skin changes due to chronic exposure to nonionizing radiation: Secondary | ICD-10-CM | POA: Diagnosis not present

## 2014-01-29 DIAGNOSIS — L570 Actinic keratosis: Secondary | ICD-10-CM | POA: Diagnosis not present

## 2014-04-12 DIAGNOSIS — L989 Disorder of the skin and subcutaneous tissue, unspecified: Secondary | ICD-10-CM | POA: Diagnosis not present

## 2014-04-12 DIAGNOSIS — I10 Essential (primary) hypertension: Secondary | ICD-10-CM | POA: Diagnosis not present

## 2014-04-12 DIAGNOSIS — E119 Type 2 diabetes mellitus without complications: Secondary | ICD-10-CM | POA: Diagnosis not present

## 2014-04-16 DIAGNOSIS — C44629 Squamous cell carcinoma of skin of left upper limb, including shoulder: Secondary | ICD-10-CM | POA: Diagnosis not present

## 2014-04-25 DIAGNOSIS — E118 Type 2 diabetes mellitus with unspecified complications: Secondary | ICD-10-CM | POA: Diagnosis not present

## 2014-05-08 DIAGNOSIS — E119 Type 2 diabetes mellitus without complications: Secondary | ICD-10-CM | POA: Diagnosis not present

## 2014-05-08 DIAGNOSIS — Z961 Presence of intraocular lens: Secondary | ICD-10-CM | POA: Diagnosis not present

## 2014-05-12 ENCOUNTER — Other Ambulatory Visit: Payer: Self-pay | Admitting: Neurology

## 2014-06-27 DIAGNOSIS — R3916 Straining to void: Secondary | ICD-10-CM | POA: Diagnosis not present

## 2014-06-27 DIAGNOSIS — N200 Calculus of kidney: Secondary | ICD-10-CM | POA: Diagnosis not present

## 2014-06-27 DIAGNOSIS — C678 Malignant neoplasm of overlapping sites of bladder: Secondary | ICD-10-CM | POA: Diagnosis not present

## 2014-06-27 DIAGNOSIS — N401 Enlarged prostate with lower urinary tract symptoms: Secondary | ICD-10-CM | POA: Diagnosis not present

## 2014-06-29 ENCOUNTER — Other Ambulatory Visit: Payer: Self-pay | Admitting: Urology

## 2014-07-16 DIAGNOSIS — J986 Disorders of diaphragm: Secondary | ICD-10-CM

## 2014-07-16 HISTORY — DX: Disorders of diaphragm: J98.6

## 2014-07-16 NOTE — Patient Instructions (Addendum)
Your procedure is scheduled on:  07/23/14     MONDAY  Report to [facility] at    2:30 PM.   Call this number if you have problems the morning of surgery: [phone]        Do not eat food  After Midnight. Sunday NIGHT-- MAY TAKE CLEAR LIQUIDS UNTIL 10:30 AM-  THEN NOTHING BY MOUTH   Take these medicines the morning of surgery with A SIP OF WATER: METROPROLOL, AVODART, NEMANADA   DO NOT TAKE ANY BLOOD SUGAR MEDICATION MORNING OF SURGERY.  Contacts, dentures or partial plates, or metal hairpins  can not be worn to surgery. Your family will be responsible for glasses, dentures, hearing aides while you are in surgery  Leave suitcase in the car. After surgery it may be brought to your room.  For patients admitted to the hospital, checkout time is 11:00 AM day of  discharge.         [facility] IS NOT RESPONSIBLE FOR ANY VALUABLES  Patients discharged the day of surgery will not be allowed to drive home. IF going home the day of surgery, you must have a driver and someone to stay with you for the first 24 hours  Name and phone number of your driver:         Wife  JULIA                                                                                                                                [facility] - Preparing for Surgery Before surgery, you can play an important role.  Because skin is not sterile, your skin needs to be as free of germs as possible.  You can reduce the number of germs on your skin by washing with CHG (chlorahexidine gluconate) soap before surgery.  CHG is an antiseptic cleaner which kills germs and bonds with the skin to continue killing germs even after washing. Please DO NOT use if you have an allergy to CHG or antibacterial soaps.  If your skin becomes reddened/irritated stop using the CHG and inform your nurse when you arrive at Short Stay. Do not shave (including legs and underarms) for  at least 48 hours prior to the first CHG shower.  You may shave your face/neck. Please follow these instructions carefully:  1.  Shower with CHG Soap the night before surgery and the  morning of Surgery.  2.  If you choose to wash your hair, wash your hair first as usual with your  normal  shampoo.  3.  After you shampoo, rinse your hair and body thoroughly to remove the  shampoo.                           4.  Use CHG as you would any other liquid soap.  You can apply chg directly  to the skin and wash                       Gently with a scrungie or clean washcloth.  5.  Apply the CHG Soap to your body ONLY FROM THE NECK DOWN.   Do not use on face/ open                           Wound or open sores. Avoid contact with eyes, ears mouth and genitals (private parts).                       Wash face,  Genitals (private parts) with your normal soap.             6.  Wash thoroughly, paying special attention to the area where your surgery  will be performed.  7.  Thoroughly rinse your body with warm water from the neck down.  8.  DO NOT shower/wash with your normal soap after using and rinsing off  the CHG Soap.                9.  Pat yourself dry with a clean towel.            10.  Wear clean pajamas.            11.  Place clean sheets on your bed the night of your first shower and do not  sleep with pets. Day of Surgery : Do not apply any lotions/deodorants the morning of surgery.  Please wear clean clothes to the hospital/surgery center.  FAILURE TO FOLLOW THESE INSTRUCTIONS MAY RESULT IN THE CANCELLATION OF YOUR SURGERY PATIENT SIGNATURE_________________________________  NURSE SIGNATURE__________________________________  ________________________________________________________________________    CLEAR LIQUID DIET   Foods Allowed                                                                     Foods Excluded  Coffee and tea, regular and decaf                             liquids that  you cannot  Plain Jell-O in any flavor                                             see through such as: Fruit ices (not with fruit pulp)                                     milk, soups, orange juice  Iced Popsicles                                    All solid food Carbonated beverages, regular and diet  Cranberry, grape and apple juices Sports drinks like Gatorade Lightly seasoned clear broth or consume(fat free) Sugar, honey syrup  Sample Menu Breakfast                                Lunch                                     Supper Cranberry juice                    Beef broth                            Chicken broth Jell-O                                     Grape juice                           Apple juice Coffee or tea                        Jell-O                                      Popsicle                                                Coffee or tea                        Coffee or tea  _____________________________________________________________________

## 2014-07-16 NOTE — Progress Notes (Signed)
Neurologist  6/15  Dr Ashok Croon  3/14 ekg 4/15 epic

## 2014-07-17 ENCOUNTER — Encounter (HOSPITAL_COMMUNITY): Payer: Self-pay

## 2014-07-17 ENCOUNTER — Ambulatory Visit (HOSPITAL_COMMUNITY)
Admission: RE | Admit: 2014-07-17 | Discharge: 2014-07-17 | Disposition: A | Discharge status: Home / Self Care | Payer: Medicare Other | Source: Ambulatory Visit | Attending: Urology | Admitting: Urology

## 2014-07-17 ENCOUNTER — Encounter (HOSPITAL_COMMUNITY)
Admission: RE | Admit: 2014-07-17 | Discharge: 2014-07-17 | Disposition: A | Discharge status: Home / Self Care | Payer: Medicare Other | Source: Ambulatory Visit | Attending: Urology | Admitting: Urology

## 2014-07-17 DIAGNOSIS — I10 Essential (primary) hypertension: Secondary | ICD-10-CM

## 2014-07-17 DIAGNOSIS — E119 Type 2 diabetes mellitus without complications: Secondary | ICD-10-CM | POA: Diagnosis not present

## 2014-07-17 DIAGNOSIS — Z01818 Encounter for other preprocedural examination: Secondary | ICD-10-CM | POA: Diagnosis not present

## 2014-07-17 DIAGNOSIS — R791 Abnormal coagulation profile: Secondary | ICD-10-CM | POA: Diagnosis not present

## 2014-07-17 LAB — CBC
HEMATOCRIT: 43.8 % (ref 39.0–52.0)
HEMOGLOBIN: 14.7 g/dL (ref 13.0–17.0)
MCH: 28.3 pg (ref 26.0–34.0)
MCHC: 33.6 g/dL (ref 30.0–36.0)
MCV: 84.2 fL (ref 78.0–100.0)
Platelets: 245 10*3/uL (ref 150–400)
RBC: 5.2 MIL/uL (ref 4.22–5.81)
RDW: 12.8 % (ref 11.5–15.5)
WBC: 11.6 10*3/uL — ABNORMAL HIGH (ref 4.0–10.5)

## 2014-07-17 LAB — BASIC METABOLIC PANEL
ANION GAP: 7 (ref 5–15)
BUN: 12 mg/dL (ref 6–23)
CALCIUM: 9.3 mg/dL (ref 8.4–10.5)
CO2: 30 mmol/L (ref 19–32)
Chloride: 106 mmol/L (ref 96–112)
Creatinine, Ser: 0.96 mg/dL (ref 0.50–1.35)
GFR calc non Af Amer: 81 mL/min — ABNORMAL LOW (ref >90)
GLUCOSE: 192 mg/dL — AB (ref 70–99)
POTASSIUM: 4.8 mmol/L (ref 3.5–5.1)
Sodium: 143 mmol/L (ref 135–145)

## 2014-07-21 NOTE — H&P (Signed)
History of Present Illness [name] [name] is a 74 year old previously followed by Dr. Tresa Endo. 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 He was noted to have a recurrence in 2013 and underwent TURBT in July 2013. He was also incidentally noted to have a stricture of the distal left ureter on RPG studies at that time and a brush biopsy was performed.    Oct 2007: TURBT, Low grade Ta  Jul 2013: TURBT (right bladder neck 1.5 cm tumor, low grade Ta),brush biopsy of distal left ureteral stricture (no malignancy)  Mar 2015: TURBT - Low grade Ta    2) Nephrolithiasis: He has a long standing history of calcium oxalate stones. He was incidentally noted to have a large partial staghorn calculus during retrograde pyelography in February 2015.     Apr 2015: L PCNL    3) Prostate cancer screening: We discontinued screening in 2014 due to his low risk for prostate cancer at that time and progressive dementia.  Last PSA: 0.35 (June 2014)    4) BPH/LUTS: He is treated with Avodart.    Interval history:    He follows up today for cystoscopic surveillance of his bladder cancer. He denies any recent hematuria or change in voiding habits. He denies any new medical problems. His dementia has remained relatively stable according to his wife.     Past Medical History Problems  1. History of diabetes mellitus (Z86.39) 2. History of hypertension (Z86.79) 3. History of kidney stones (Q73.419)  Surgical History Problems  1. History of Cystoscopy With Fulguration 2. History of Cystoscopy With Fulguration Small Lesion (5-62mm) 3. History of Cystoscopy With Fulguration Small Lesion (5-19mm) 4. History of Cystoscopy With Insertion Of Ureteral Stent Left 5. History of Cystoscopy With Insertion Of Ureteral Stent Left 6. History of Cystoscopy With Insertion Of  Ureteral Stent Right 7. History of Cystoscopy With Insertion Of Ureteral Stent Right 8. History of Cystoscopy With Pyeloscopy With Lithotripsy 9. History of Cystoscopy With Ureteroscopy Left 10. History of Cystoscopy With Ureteroscopy With Removal Of Calculus 11. History of Percutaneous Lithotomy For Stone Over 2cm.  Current Meds 1. Avodart 0.5 MG Oral Capsule; Take 1 capsule by mouth once daily;  Therapy: 37TKW4097 to (Evaluate:11Aug2016)  Requested for: [phone]; Last  Rx:17Aug2015 Ordered 2. Fish Oil CAPS;  Therapy: (Recorded:16Feb2010) to Recorded 3. Fluorouracil 5 % External Cream;  Therapy: 68TMH9622 to Recorded 4. Fluticasone Propionate 0.05 % External Cream;  Therapy: 29NLG9211 to Recorded 5. Hydrochlorothiazide 25 MG Oral Tablet;  Therapy: (Recorded:29Nov2007) to Recorded 6. MetFORMIN HCl - 500 MG Oral Tablet;  Therapy: (Recorded:05Mar2014) to Recorded 7. Namenda XR 28 MG Oral Capsule Extended Release 24 Hour;  Therapy: 27Feb2014 to Recorded 8. Onglyza 5 MG Oral Tablet;  Therapy: 94RDE0814 to Recorded 9. Perindopril Erbumine 4 MG Oral Tablet;  Therapy: (Recorded:05Mar2014) to Recorded 10. Potassium Citrate ER 10 MEQ (1080 MG) Oral Tablet Extended Release; take 1 tablet by   mouth three times a day;   Therapy: 48JEH6314 to (Evaluate:28Apr2016)  Requested for: 97WYO3785; Last   YI:50YDX4128 Ordered 11. Vitamin B Complex TABS;   Therapy: (Recorded:16Feb2010) to Recorded 12. Vitamin B12 TABS;   Therapy: (Recorded:16Feb2010) to Recorded 13. Vitamin C TABS;   Therapy: (Recorded:16Feb2010) to Recorded 14. Vitamin E TABS;   Therapy: (Recorded:16Feb2010) to Recorded  Allergies Medication  1. Nitrofurantoin Monohyd Macro CAPS 2. Sulfa Drugs  3. Tetracyclines  Family History Problems  1. No pertinent family history : Mother 2. Denied: Family history of Prostate Cancer  Social History Problems  1. Never A Smoker  Vitals Vital Signs [Data Includes: Last 1 Day]   Recorded: 53GUY4034 02:53PM  Weight: 198 lb  BMI Calculated: 27.62 BSA Calculated: 2.1 Blood Pressure: 139 / 74 Temperature: 97.8 F Heart Rate: 82  Physical Exam Constitutional: Well nourished and well developed . No acute distress.  ENT:. The ears and nose are normal in appearance.  Neck: The appearance of the neck is normal and no neck mass is present.  Pulmonary: No respiratory distress and normal respiratory rhythm and effort.  Cardiovascular: Heart rate and rhythm are normal . No peripheral edema.  Genitourinary: Examination of the penis demonstrates no lesions and a normal meatus.  Neuro/Psych:. Mood and affect are appropriate.    Results/Data Urine [Data Includes: Last 1 Day]   [id number]  COLOR YELLOW   APPEARANCE CLEAR   SPECIFIC GRAVITY <1.005   pH 5.0   GLUCOSE NEG mg/dL  BILIRUBIN NEG   KETONE NEG mg/dL  BLOOD NEG   PROTEIN NEG mg/dL  UROBILINOGEN 0.2 mg/dL  NITRITE NEG   LEUKOCYTE ESTERASE NEG    Procedure  Procedure: Cystoscopy  Chaperone Present: Earlyne Iba.  Indication: History of Urothelial Carcinoma.  Informed Consent: Risks, benefits, and potential adverse events were discussed and informed consent was obtained from the patient.  Prep: The patient was prepped with betadine.  Anesthesia:. Local anesthesia was administered intraurethrally with 2% lidocaine jelly.  Antibiotic prophylaxis: Ciprofloxacin.  Procedure Note:  Urethral meatus:. No abnormalities.  Anterior urethra: No abnormalities.  Prostatic urethra:. The lateral prostatic lobes were enlarged.  Bladder: Visulization was clear. The ureteral orifices were in the normal anatomic position bilaterally and had clear efflux of urine. Systematic examination of the bladder revealed a papillary 1.5 cm tumor in the trigone of the bladder just medial to the right ureteral orifice. No other bladder tumors, stones, or other mucosal pathology was identified. A saline bladder washing was obtained and sent for  cytologic analysis. The patient tolerated the procedure well.  Complications: None.    Assessment Assessed  1. Malignant neoplasm of overlapping sites of bladder (C67.8) 2. Nephrolithiasis (N20.0) 3. Benign prostatic hyperplasia (BPH) with straining on urination (N40.1,R39.16)  Plan Health Maintenance  1. UA With REFLEX; [Do Not Release]; Status:Complete;   Done: 75IEP3295 02:30PM Malignant neoplasm of overlapping sites of bladder  2. Follow-up Office  Follow-up - will call to schedule surgery  Status: Hold For -  Appointment,Date of Service  Requested for: [id number] 3. Follow-up Office  Follow-up - will call to schedule surgery  Status: Hold For -  Appointment,Date of Service  Requested for: 30ZSW1093  Discussion/Summary 1. Urothelial carcinoma of the bladder: Mr. Palka does have a recurrent tumor. I recommended receiving with cystoscopy, bilateral retrograde pyelography, transurethral resection of his bladder tumor, and postoperative mitomycin C instillation. We have reviewed the potential risks, complications, and expected recovery process. He and his wife have had all their questions asked and both give informed consent to proceed in the near future.    2. Urolithiasis: No evidence for recurrence.    3. BPH/LUTS: He continues to void subjectively well on Avodart.     Verified Results URINE CYTOLOGY1 [id number] 03:35PM1 Read Drivers  SPECIMEN TYPE: OTHER  [Jun 28, 2014 3:06PM Aarib Pulido] Please notify patient that cytology is negative/normal.   Test Name Result Flag Reference  FINAL DIAGNOSIS:1     -  NO MALIGNANT CELLS IDENTIFIED. - BENIGN REACTIVE UROTHELIAL CELLS ARE PRESENT.  SOURCE:1 Bladder Washing1    100CC OF LIGHT YELLOW BLW RECEIVED IN FIXATIVE 1 SLIDE PREPARED 011416 LW  Relevant Clinical Info1     MALIGNANT NEOPLASM OF OVERLAPPING SITES OF BLADDER C67.8  PATHOLOGIST:1     REVIEWED BY S. SERDAR DEMIRCI, MD, (ELECTRONIC SIGNATURE ON FILE)  NUMBER OF  SLIDES1     1 Container Submitted  CYTOTECHNOLOGIST:1     JWW, BS CT(ASCP), MLT(ASCP)     1. Amended By: Raynelle Bring; Jun 28 2014 3:06 PM EST  Signatures Electronically signed by : Raynelle Bring, M.D.; Jun 28 2014  3:06PM EST

## 2014-07-23 ENCOUNTER — Ambulatory Visit (HOSPITAL_COMMUNITY): Payer: Medicare Other | Admitting: Anesthesiology

## 2014-07-23 ENCOUNTER — Ambulatory Visit (HOSPITAL_COMMUNITY)
Admission: RE | Admit: 2014-07-23 | Discharge: 2014-07-23 | Disposition: A | Discharge status: Home / Self Care | Payer: Medicare Other | Source: Ambulatory Visit | Attending: Urology | Admitting: Urology

## 2014-07-23 ENCOUNTER — Encounter (HOSPITAL_COMMUNITY): Payer: Self-pay | Admitting: *Deleted

## 2014-07-23 ENCOUNTER — Encounter (HOSPITAL_COMMUNITY)
Admission: RE | Disposition: A | Discharge status: Home / Self Care | Payer: Self-pay | Source: Ambulatory Visit | Attending: Urology

## 2014-07-23 DIAGNOSIS — C679 Malignant neoplasm of bladder, unspecified: Secondary | ICD-10-CM | POA: Diagnosis not present

## 2014-07-23 DIAGNOSIS — D414 Neoplasm of uncertain behavior of bladder: Secondary | ICD-10-CM | POA: Diagnosis not present

## 2014-07-23 DIAGNOSIS — Z8551 Personal history of malignant neoplasm of bladder: Secondary | ICD-10-CM | POA: Diagnosis not present

## 2014-07-23 DIAGNOSIS — N400 Enlarged prostate without lower urinary tract symptoms: Secondary | ICD-10-CM | POA: Diagnosis not present

## 2014-07-23 DIAGNOSIS — D090 Carcinoma in situ of bladder: Secondary | ICD-10-CM | POA: Diagnosis not present

## 2014-07-23 DIAGNOSIS — C678 Malignant neoplasm of overlapping sites of bladder: Secondary | ICD-10-CM | POA: Diagnosis not present

## 2014-07-23 DIAGNOSIS — I10 Essential (primary) hypertension: Secondary | ICD-10-CM | POA: Diagnosis not present

## 2014-07-23 DIAGNOSIS — E119 Type 2 diabetes mellitus without complications: Secondary | ICD-10-CM | POA: Diagnosis not present

## 2014-07-23 DIAGNOSIS — D494 Neoplasm of unspecified behavior of bladder: Secondary | ICD-10-CM | POA: Diagnosis present

## 2014-07-23 HISTORY — PX: CYSTOSCOPY W/ RETROGRADES: SHX1426

## 2014-07-23 HISTORY — PX: TRANSURETHRAL RESECTION OF BLADDER TUMOR: SHX2575

## 2014-07-23 SURGERY — TURBT (TRANSURETHRAL RESECTION OF BLADDER TUMOR)
Anesthesia: General

## 2014-07-23 MED ORDER — PHENAZOPYRIDINE HCL 100 MG PO TABS: 100.0000 mg | ORAL_TABLET | Freq: Three times a day (TID) | ORAL | Status: DC | PRN

## 2014-07-23 MED ORDER — FENTANYL CITRATE 0.05 MG/ML IJ SOLN
INTRAMUSCULAR | Status: AC
  Filled 2014-07-23: qty 2

## 2014-07-23 MED ORDER — SODIUM CHLORIDE 0.9 % IR SOLN
Status: DC | PRN
  Administered 2014-07-23: 6000 mL

## 2014-07-23 MED ORDER — CIPROFLOXACIN IN D5W 400 MG/200ML IV SOLN
INTRAVENOUS | Status: AC
  Filled 2014-07-23: qty 200

## 2014-07-23 MED ORDER — HYDROMORPHONE HCL 1 MG/ML IJ SOLN
INTRAMUSCULAR | Status: AC
  Filled 2014-07-23: qty 1

## 2014-07-23 MED ORDER — ONDANSETRON HCL 4 MG/2ML IJ SOLN
INTRAMUSCULAR | Status: DC | PRN
  Administered 2014-07-23: 4 mg via INTRAVENOUS

## 2014-07-23 MED ORDER — LACTATED RINGERS IV SOLN
INTRAVENOUS | Status: DC
  Administered 2014-07-23: 1000 mL via INTRAVENOUS

## 2014-07-23 MED ORDER — FENTANYL CITRATE 0.05 MG/ML IJ SOLN
INTRAMUSCULAR | Status: DC | PRN
  Administered 2014-07-23: 25 ug via INTRAVENOUS
  Administered 2014-07-23: 50 ug via INTRAVENOUS

## 2014-07-23 MED ORDER — LIDOCAINE HCL (CARDIAC) 20 MG/ML IV SOLN
INTRAVENOUS | Status: DC | PRN
  Administered 2014-07-23: 20 mg via INTRAVENOUS

## 2014-07-23 MED ORDER — OXYCODONE HCL 5 MG/5ML PO SOLN: 5.0000 mg | Freq: Once | ORAL | Status: DC | PRN

## 2014-07-23 MED ORDER — MITOMYCIN CHEMO FOR BLADDER INSTILLATION 40 MG
40.0000 mg | Freq: Once | INTRAVENOUS | Status: AC
  Administered 2014-07-23: 40 mg via INTRAVESICAL
  Filled 2014-07-23: qty 40

## 2014-07-23 MED ORDER — PROPOFOL 10 MG/ML IV BOLUS
INTRAVENOUS | Status: DC | PRN
  Administered 2014-07-23: 150 mg via INTRAVENOUS

## 2014-07-23 MED ORDER — HYDROCODONE-ACETAMINOPHEN 5-325 MG PO TABS: 1.0000 | ORAL_TABLET | Freq: Four times a day (QID) | ORAL | Status: DC | PRN

## 2014-07-23 MED ORDER — PROPOFOL 10 MG/ML IV BOLUS
INTRAVENOUS | Status: AC
  Filled 2014-07-23: qty 20

## 2014-07-23 MED ORDER — LIDOCAINE HCL (CARDIAC) 20 MG/ML IV SOLN
INTRAVENOUS | Status: AC
  Filled 2014-07-23: qty 5

## 2014-07-23 MED ORDER — CIPROFLOXACIN IN D5W 400 MG/200ML IV SOLN
400.0000 mg | INTRAVENOUS | Status: AC
  Administered 2014-07-23: 400 mg via INTRAVENOUS

## 2014-07-23 MED ORDER — HYDROMORPHONE HCL 1 MG/ML IJ SOLN
0.2500 mg | INTRAMUSCULAR | Status: DC | PRN
  Administered 2014-07-23 (×4): 0.5 mg via INTRAVENOUS

## 2014-07-23 MED ORDER — OXYCODONE HCL 5 MG PO TABS: 5.0000 mg | ORAL_TABLET | Freq: Once | ORAL | Status: DC | PRN

## 2014-07-23 MED ORDER — PROMETHAZINE HCL 25 MG/ML IJ SOLN: 6.2500 mg | INTRAMUSCULAR | Status: DC | PRN

## 2014-07-23 MED ORDER — 0.9 % SODIUM CHLORIDE (POUR BTL) OPTIME
TOPICAL | Status: DC | PRN
  Administered 2014-07-23: 1000 mL

## 2014-07-23 SURGICAL SUPPLY — 20 items
BAG URINE DRAINAGE (UROLOGICAL SUPPLIES) ×4 IMPLANT
BAG URO CATCHER STRL LF (DRAPE) ×4 IMPLANT
CATH FOLEY 2WAY SLVR  5CC 16FR (CATHETERS) ×2
CATH FOLEY 2WAY SLVR 5CC 16FR (CATHETERS) ×2 IMPLANT
CATH INTERMIT  6FR 70CM (CATHETERS) ×4 IMPLANT
ELECT LOOP 22F BIPOLAR SML (ELECTROSURGICAL) ×4
ELECT REM PT RETURN 9FT ADLT (ELECTROSURGICAL)
ELECTRODE LOOP 22F BIPOLAR SML (ELECTROSURGICAL) ×2 IMPLANT
ELECTRODE REM PT RTRN 9FT ADLT (ELECTROSURGICAL) IMPLANT
EVACUATOR MICROVAS BLADDER (UROLOGICAL SUPPLIES) IMPLANT
GLOVE BIOGEL M STRL SZ7.5 (GLOVE) ×8 IMPLANT
GOWN STRL REUS W/TWL LRG LVL3 (GOWN DISPOSABLE) ×8 IMPLANT
KIT ASPIRATION TUBING (SET/KITS/TRAYS/PACK) IMPLANT
LOOP CUT BIPOLAR 24F LRG (ELECTROSURGICAL) IMPLANT
MANIFOLD NEPTUNE II (INSTRUMENTS) ×4 IMPLANT
PACK CYSTO (CUSTOM PROCEDURE TRAY) ×4 IMPLANT
PLUG CATH AND CAP STER (CATHETERS) ×4 IMPLANT
SYRINGE IRR TOOMEY STRL 70CC (SYRINGE) ×4 IMPLANT
TUBING CONNECTING 10 (TUBING) ×3 IMPLANT
TUBING CONNECTING 10' (TUBING) ×1

## 2014-07-23 NOTE — Discharge Instructions (Addendum)
1. You may see some blood in the urine and may have some burning with urination for 48-72 hours. You also may notice that you have to urinate more frequently or urgently after your procedure which is normal.  2. You should call should you develop an inability urinate, fever > 101, persistent nausea and vomiting that prevents you from eating or drinking to stay hydrated.  3. If you have a stent, you will likely urinate more frequently and urgently until the stent is removed and you may experience some discomfort/pain in the lower abdomen and flank especially when urinating. You may take pain medication prescribed to you if needed for pain. You may also intermittently have blood in the urine until the stent is removed.      General Anesthesia, Care After Refer to this sheet in the next few weeks. These instructions provide you with information on caring for yourself after your procedure. Your health care provider may also give you more specific instructions. Your treatment has been planned according to current medical practices, but problems sometimes occur. Call your health care provider if you have any problems or questions after your procedure. WHAT TO EXPECT AFTER THE PROCEDURE After the procedure, it is typical to experience:  Sleepiness.  Nausea and vomiting. HOME CARE INSTRUCTIONS  For the first 24 hours after general anesthesia:  Have a responsible person with you.  Do not drive a car. If you are alone, do not take public transportation.  Do not drink alcohol.  Do not take medicine that has not been prescribed by your health care provider.  Do not sign important papers or make important decisions.  You may resume a normal diet and activities as directed by your health care provider.  Change bandages (dressings) as directed.  If you have questions or problems that seem related to general anesthesia, call the hospital and ask for the anesthetist or anesthesiologist on  call. SEEK MEDICAL CARE IF:  You have nausea and vomiting that continue the day after anesthesia.  You develop a rash. SEEK IMMEDIATE MEDICAL CARE IF:   You have difficulty breathing.  You have chest pain.  You have any allergic problems. Document Released: 09/07/2000 Document Revised: 06/06/2013 Document Reviewed: 12/15/2012 [facility] Patient Information 2015 [place], [place]. This information is not intended to replace advice given to you by your health care provider. Make sure you discuss any questions you have with your health care provider.

## 2014-07-23 NOTE — Anesthesia Preprocedure Evaluation (Addendum)
Anesthesia Evaluation  Patient identified by MRN, date of birth, ID band Patient awake    Reviewed: Allergy & Precautions, NPO status , Patient's Chart, lab work & pertinent test results, reviewed documented beta blocker date and time   History of Anesthesia Complications Negative for: history of anesthetic complications  Airway Mallampati: II  TM Distance: >3 FB Neck ROM: Full    Dental  (+) Caps, Dental Advisory Given   Pulmonary neg pulmonary ROS,    Pulmonary exam normal       Cardiovascular hypertension, Pt. on medications and Pt. on home beta blockers     Neuro/Psych Denemtia    GI/Hepatic negative GI ROS, Neg liver ROS,   Endo/Other  diabetes  Renal/GU      Musculoskeletal   Abdominal   Peds  Hematology   Anesthesia Other Findings   Reproductive/Obstetrics                           Anesthesia Physical Anesthesia Plan  ASA: III  Anesthesia Plan: General   Post-op Pain Management:    Induction: Intravenous  Airway Management Planned: LMA  Additional Equipment:   Intra-op Plan:   Post-operative Plan: Extubation in OR  Informed Consent: I have reviewed the patients History and Physical, chart, labs and discussed the procedure including the risks, benefits and alternatives for the proposed anesthesia with the patient or authorized representative who has indicated his/her understanding and acceptance.   Dental advisory given  Plan Discussed with: CRNA, Anesthesiologist and Surgeon  Anesthesia Plan Comments:       Anesthesia Quick Evaluation

## 2014-07-23 NOTE — Op Note (Signed)
Preoperative diagnosis: 1. Bladder tumor (2.5 cm)  Postoperative diagnosis:  1. Bladder tumor (2.5 cm)  Procedure:  1. Cystoscopy 2. Transurethral resection of bladder tumor (2.5 cm) 3. Bilateral retrograde pyelography with interpretation  Surgeon: Pryor Curia. M.D.  Anesthesia: General  Complications: None  Intraoperative findings:  1. Bladder tumor: There was a 2.5 cm papillary tumor located on the trigone just medial to the right ureteral orifice. 2. Retrograde pyelography: Right retrograde pyelogram demonstrates a normal caliber right ureter with no filling defects of the renal pelvis or ureter.  The left retrograde pyelogram demonstrated no renal pelvic filling defects and a normal caliber proximal and mid ureter with a stable 2 cm stricture of the distal left ureter without proximal dilation.  This was stable from his prior evaluation last year.  EBL: Minimal  Specimens: 1. Bladder tumor  Disposition of specimens: Pathology  Indication: [name] [name] is a patient who has a history of urothelial carcinoma of the bladder and was found to have a solitary papillary bladder tumor. After reviewing the management options for treatment, he elected to proceed with the above surgical procedure(s). We have discussed the potential benefits and risks of the procedure, side effects of the proposed treatment, the likelihood of the patient achieving the goals of the procedure, and any potential problems that might occur during the procedure or recuperation. Informed consent has been obtained.  Description of procedure:  The patient was taken to the operating room and general anesthesia was induced.  The patient was placed in the dorsal lithotomy position, prepped and draped in the usual sterile fashion, and preoperative antibiotics were administered. A preoperative time-out was performed.   Cystourethroscopy was performed.  The patient's urethra was examined and demonstrated  bilobar prostatic hypertrophy.   The bladder was then systematically examined in its entirety. There was noted to be a solitary 2.5 cm papillary tumor just medial to the right ureteral orifice. No other bladder tumors or other mucosal abnormalities were identified on exam with both a 30 degree and 70 degree lens.  Attention then turned to the right ureteral orifice and a 6 Fr ureteral catheter was used to intubate the ureteral orifice.  Omnipaque contrast was injected through the ureteral catheter and a retrograde pyelogram was performed with findings as dictated above.  Attention then turned to the left ureteral orifice and a 6 Fr ureteral catheter was used to intubate the ureteral orifice.  Omnipaque contrast was injected through the ureteral catheter and a retrograde pyelogram was performed with findings as dictated above.  The bladder was then re-examined after the resectoscope was placed.  The bladder tumor was 2.5 cm.  It was located just medial to the right ureteral orifice and appeared papilalry. Using loop cautery resection, the entire tumor was resected and removed for permanent pathologic analysis.   Hemostasis was then achieved with the loop cautery and the bladder was emptied and reinspected with no further bleeding noted at the end of the procedure.    The bladder was then emptied and the procedure ended. A 16 Fr catheter was placed for postoperative instillation of intravesical chemotherapy in the PACU.  The patient appeared to tolerate the procedure well and without complications.  The patient was able to be awakened and transferred to the recovery unit in satisfactory condition.    PACU procedure:  In the recovery room, I instilled 40 mg of Mitomycin C in 40 cc of water into his bladder.  This was left indwelling for 1  hour and then drained and the catheter removed.   Pryor Curia MD

## 2014-07-23 NOTE — Anesthesia Postprocedure Evaluation (Signed)
Anesthesia Post Note  Patient: [name] [name]  Procedure(s) Performed: Procedure(s) (LRB): TRANSURETHRAL RESECTION OF BLADDER TUMOR (TURBT) WITH MITOMYCIN INSTILLATION (N/A) CYSTOSCOPY WITH RETROGRADE PYELOGRAM (Bilateral)  Anesthesia type: general  Patient location: PACU  Post pain: Pain level controlled  Post assessment: Patient's Cardiovascular Status Stable  Last Vitals:  Filed Vitals:   07/23/14 1705  BP:   Pulse: 77  Temp:   Resp: 13    Post vital signs: Reviewed and stable  Level of consciousness: sedated  Complications: No apparent anesthesia complications

## 2014-07-23 NOTE — Interval H&P Note (Signed)
History and Physical Interval Note:  07/23/2014 3:26 PM  [name] [name]  has presented today for surgery, with the diagnosis of BLADDER CANCER  The various methods of treatment have been discussed with the patient and family. After consideration of risks, benefits and other options for treatment, the patient has consented to  Procedure(s): TRANSURETHRAL RESECTION OF BLADDER TUMOR (TURBT) WITH MITOMYCIN INSTILLATION (N/A) CYSTOSCOPY WITH RETROGRADE PYELOGRAM (Bilateral) as a surgical intervention .  The patient's history has been reviewed, patient examined, no change in status, stable for surgery.  I have reviewed the patient's chart and labs.  Questions were answered to the patient's satisfaction.     [name],LES

## 2014-07-23 NOTE — Transfer of Care (Signed)
Immediate Anesthesia Transfer of Care Note  Patient: [name] [name]  Procedure(s) Performed: Procedure(s) (LRB): TRANSURETHRAL RESECTION OF BLADDER TUMOR (TURBT) WITH MITOMYCIN INSTILLATION (N/A) CYSTOSCOPY WITH RETROGRADE PYELOGRAM (Bilateral)  Patient Location: PACU  Anesthesia Type: General  Level of Consciousness: sedated, patient cooperative and responds to stimulation  Airway & Oxygen Therapy: Patient Spontanous Breathing and Patient connected to face mask oxgen  Post-op Assessment: Report given to PACU RN and Post -op Vital signs reviewed and stable  Post vital signs: Reviewed and stable  Complications: No apparent anesthesia complications

## 2014-07-24 ENCOUNTER — Encounter (HOSPITAL_COMMUNITY): Payer: Self-pay | Admitting: Urology

## 2014-07-24 LAB — GLUCOSE, CAPILLARY
Glucose-Capillary: 194 mg/dL — ABNORMAL HIGH (ref 70–99)
Glucose-Capillary: 172 mg/dL — ABNORMAL HIGH (ref 70–99)

## 2014-07-31 ENCOUNTER — Other Ambulatory Visit: Payer: Self-pay | Admitting: Dermatology

## 2014-07-31 DIAGNOSIS — C44329 Squamous cell carcinoma of skin of other parts of face: Secondary | ICD-10-CM | POA: Diagnosis not present

## 2014-07-31 DIAGNOSIS — Z85828 Personal history of other malignant neoplasm of skin: Secondary | ICD-10-CM | POA: Diagnosis not present

## 2014-07-31 DIAGNOSIS — Z08 Encounter for follow-up examination after completed treatment for malignant neoplasm: Secondary | ICD-10-CM | POA: Diagnosis not present

## 2014-07-31 DIAGNOSIS — L570 Actinic keratosis: Secondary | ICD-10-CM | POA: Diagnosis not present

## 2014-08-06 DIAGNOSIS — E118 Type 2 diabetes mellitus with unspecified complications: Secondary | ICD-10-CM | POA: Diagnosis not present

## 2014-08-08 DIAGNOSIS — C678 Malignant neoplasm of overlapping sites of bladder: Secondary | ICD-10-CM | POA: Diagnosis not present

## 2014-08-13 DIAGNOSIS — I10 Essential (primary) hypertension: Secondary | ICD-10-CM | POA: Diagnosis not present

## 2014-08-13 DIAGNOSIS — E118 Type 2 diabetes mellitus with unspecified complications: Secondary | ICD-10-CM | POA: Diagnosis not present

## 2014-08-13 DIAGNOSIS — C679 Malignant neoplasm of bladder, unspecified: Secondary | ICD-10-CM | POA: Diagnosis not present

## 2014-08-15 DIAGNOSIS — Z5111 Encounter for antineoplastic chemotherapy: Secondary | ICD-10-CM | POA: Diagnosis not present

## 2014-08-15 DIAGNOSIS — C678 Malignant neoplasm of overlapping sites of bladder: Secondary | ICD-10-CM | POA: Diagnosis not present

## 2014-08-22 DIAGNOSIS — N390 Urinary tract infection, site not specified: Secondary | ICD-10-CM | POA: Diagnosis not present

## 2014-08-22 DIAGNOSIS — C678 Malignant neoplasm of overlapping sites of bladder: Secondary | ICD-10-CM | POA: Diagnosis not present

## 2014-08-29 DIAGNOSIS — Z5111 Encounter for antineoplastic chemotherapy: Secondary | ICD-10-CM | POA: Diagnosis not present

## 2014-08-29 DIAGNOSIS — C678 Malignant neoplasm of overlapping sites of bladder: Secondary | ICD-10-CM | POA: Diagnosis not present

## 2014-09-05 DIAGNOSIS — C678 Malignant neoplasm of overlapping sites of bladder: Secondary | ICD-10-CM | POA: Diagnosis not present

## 2014-09-05 DIAGNOSIS — Z5111 Encounter for antineoplastic chemotherapy: Secondary | ICD-10-CM | POA: Diagnosis not present

## 2014-09-12 DIAGNOSIS — Z5111 Encounter for antineoplastic chemotherapy: Secondary | ICD-10-CM | POA: Diagnosis not present

## 2014-09-12 DIAGNOSIS — C678 Malignant neoplasm of overlapping sites of bladder: Secondary | ICD-10-CM | POA: Diagnosis not present

## 2014-09-19 DIAGNOSIS — Z5111 Encounter for antineoplastic chemotherapy: Secondary | ICD-10-CM | POA: Diagnosis not present

## 2014-09-19 DIAGNOSIS — C678 Malignant neoplasm of overlapping sites of bladder: Secondary | ICD-10-CM | POA: Diagnosis not present

## 2014-10-22 DIAGNOSIS — Z85828 Personal history of other malignant neoplasm of skin: Secondary | ICD-10-CM | POA: Diagnosis not present

## 2014-10-22 DIAGNOSIS — Z08 Encounter for follow-up examination after completed treatment for malignant neoplasm: Secondary | ICD-10-CM | POA: Diagnosis not present

## 2014-10-22 DIAGNOSIS — L570 Actinic keratosis: Secondary | ICD-10-CM | POA: Diagnosis not present

## 2014-12-05 DIAGNOSIS — R3916 Straining to void: Secondary | ICD-10-CM | POA: Diagnosis not present

## 2014-12-05 DIAGNOSIS — C678 Malignant neoplasm of overlapping sites of bladder: Secondary | ICD-10-CM | POA: Diagnosis not present

## 2014-12-05 DIAGNOSIS — N200 Calculus of kidney: Secondary | ICD-10-CM | POA: Diagnosis not present

## 2014-12-05 DIAGNOSIS — N401 Enlarged prostate with lower urinary tract symptoms: Secondary | ICD-10-CM | POA: Diagnosis not present

## 2014-12-20 DIAGNOSIS — Z125 Encounter for screening for malignant neoplasm of prostate: Secondary | ICD-10-CM | POA: Diagnosis not present

## 2014-12-20 DIAGNOSIS — Z5111 Encounter for antineoplastic chemotherapy: Secondary | ICD-10-CM | POA: Diagnosis not present

## 2014-12-20 DIAGNOSIS — C678 Malignant neoplasm of overlapping sites of bladder: Secondary | ICD-10-CM | POA: Diagnosis not present

## 2014-12-20 DIAGNOSIS — E118 Type 2 diabetes mellitus with unspecified complications: Secondary | ICD-10-CM | POA: Diagnosis not present

## 2014-12-20 DIAGNOSIS — I10 Essential (primary) hypertension: Secondary | ICD-10-CM | POA: Diagnosis not present

## 2014-12-20 DIAGNOSIS — E789 Disorder of lipoprotein metabolism, unspecified: Secondary | ICD-10-CM | POA: Diagnosis not present

## 2014-12-27 DIAGNOSIS — Z5111 Encounter for antineoplastic chemotherapy: Secondary | ICD-10-CM | POA: Diagnosis not present

## 2014-12-27 DIAGNOSIS — C678 Malignant neoplasm of overlapping sites of bladder: Secondary | ICD-10-CM | POA: Diagnosis not present

## 2015-01-03 DIAGNOSIS — Z5111 Encounter for antineoplastic chemotherapy: Secondary | ICD-10-CM | POA: Diagnosis not present

## 2015-01-03 DIAGNOSIS — C678 Malignant neoplasm of overlapping sites of bladder: Secondary | ICD-10-CM | POA: Diagnosis not present

## 2015-01-03 DIAGNOSIS — E118 Type 2 diabetes mellitus with unspecified complications: Secondary | ICD-10-CM | POA: Diagnosis not present

## 2015-01-03 DIAGNOSIS — I10 Essential (primary) hypertension: Secondary | ICD-10-CM | POA: Diagnosis not present

## 2015-01-03 DIAGNOSIS — E039 Hypothyroidism, unspecified: Secondary | ICD-10-CM | POA: Diagnosis not present

## 2015-01-21 DIAGNOSIS — D1801 Hemangioma of skin and subcutaneous tissue: Secondary | ICD-10-CM | POA: Diagnosis not present

## 2015-01-21 DIAGNOSIS — L570 Actinic keratosis: Secondary | ICD-10-CM | POA: Diagnosis not present

## 2015-01-21 DIAGNOSIS — C44329 Squamous cell carcinoma of skin of other parts of face: Secondary | ICD-10-CM | POA: Diagnosis not present

## 2015-01-21 DIAGNOSIS — L821 Other seborrheic keratosis: Secondary | ICD-10-CM | POA: Diagnosis not present

## 2015-02-14 DIAGNOSIS — C44329 Squamous cell carcinoma of skin of other parts of face: Secondary | ICD-10-CM | POA: Diagnosis not present

## 2015-02-14 DIAGNOSIS — C44300 Unspecified malignant neoplasm of skin of unspecified part of face: Secondary | ICD-10-CM | POA: Diagnosis not present

## 2015-02-14 DIAGNOSIS — D485 Neoplasm of uncertain behavior of skin: Secondary | ICD-10-CM | POA: Diagnosis not present

## 2015-03-20 DIAGNOSIS — Z23 Encounter for immunization: Secondary | ICD-10-CM | POA: Diagnosis not present

## 2015-04-04 DIAGNOSIS — C678 Malignant neoplasm of overlapping sites of bladder: Secondary | ICD-10-CM | POA: Diagnosis not present

## 2015-04-09 ENCOUNTER — Other Ambulatory Visit: Payer: Self-pay | Admitting: Urology

## 2015-04-22 DIAGNOSIS — Z85828 Personal history of other malignant neoplasm of skin: Secondary | ICD-10-CM | POA: Diagnosis not present

## 2015-04-26 ENCOUNTER — Encounter (HOSPITAL_COMMUNITY): Payer: Self-pay

## 2015-04-26 ENCOUNTER — Encounter (HOSPITAL_COMMUNITY)
Admission: RE | Admit: 2015-04-26 | Discharge: 2015-04-26 | Disposition: A | Discharge status: Home / Self Care | Payer: Medicare Other | Source: Ambulatory Visit | Attending: Urology | Admitting: Urology

## 2015-04-26 DIAGNOSIS — Z01812 Encounter for preprocedural laboratory examination: Secondary | ICD-10-CM | POA: Diagnosis not present

## 2015-04-26 DIAGNOSIS — Z01810 Encounter for preprocedural cardiovascular examination: Secondary | ICD-10-CM | POA: Insufficient documentation

## 2015-04-26 HISTORY — DX: Personal history of other malignant neoplasm of skin: Z85.828

## 2015-04-26 LAB — CBC
HCT: 41.7 % (ref 39.0–52.0)
Hemoglobin: 14.6 g/dL (ref 13.0–17.0)
MCH: 28.9 pg (ref 26.0–34.0)
MCHC: 35 g/dL (ref 30.0–36.0)
MCV: 82.4 fL (ref 78.0–100.0)
PLATELETS: 278 10*3/uL (ref 150–400)
RBC: 5.06 MIL/uL (ref 4.22–5.81)
RDW: 12.4 % (ref 11.5–15.5)
WBC: 11.7 10*3/uL — ABNORMAL HIGH (ref 4.0–10.5)

## 2015-04-26 LAB — BASIC METABOLIC PANEL
Anion gap: 10 (ref 5–15)
BUN: 16 mg/dL (ref 6–20)
CALCIUM: 9.7 mg/dL (ref 8.9–10.3)
CO2: 29 mmol/L (ref 22–32)
Chloride: 101 mmol/L (ref 101–111)
Creatinine, Ser: 0.94 mg/dL (ref 0.61–1.24)
GFR calc Af Amer: >60 mL/min (ref >60)
GLUCOSE: 193 mg/dL — AB (ref 65–99)
Potassium: 4.2 mmol/L (ref 3.5–5.1)
SODIUM: 140 mmol/L (ref 135–145)

## 2015-04-26 NOTE — Patient Instructions (Signed)
YOUR PROCEDURE IS SCHEDULED ON : 05/13/15  REPORT TO [place] MAIN ENTRANCE FOLLOW SIGNS TO EAST ELEVATOR - GO TO 3rd FLOOR CHECK IN AT 3 EAST NURSES STATION (SHORT STAY) AT:  1:45 PM  CALL THIS NUMBER IF YOU HAVE PROBLEMS THE MORNING OF SURGERY [phone]  REMEMBER:ONLY 1 PER PERSON MAY GO TO SHORT STAY WITH YOU TO GET READY THE MORNING OF YOUR SURGERY  DO NOT EAT FOOD  AFTER MIDNIGHT  MAY HAVE CLEAR LIQUIDS UNTIL 9:45 AM  TAKE THESE MEDICINES THE MORNING OF SURGERY: AVODART / NAMENDA / METOPROLOL / UROCIT  CLEAR LIQUID DIET  Foods Allowed                                                                     Foods Excluded  Coffee and tea, regular and decaf                             liquids that you cannot  Plain Jell-O in any flavor                                             see through such as: Fruit ices (not with fruit pulp)                                     milk, soups, orange juice  Iced Popsicles                                                 All solid food Carbonated beverages, regular and diet                                    Cranberry, grape and apple juices Sports drinks like Gatorade Lightly seasoned clear broth or consume(fat free) Sugar, honey syrup   _____________________________________________________________________    YOU MAY NOT HAVE ANY METAL ON YOUR BODY INCLUDING HAIR PINS AND PIERCING'S. DO NOT WEAR JEWELRY, MAKEUP, LOTIONS, POWDERS OR PERFUMES. DO NOT WEAR NAIL POLISH. DO NOT SHAVE 48 HRS PRIOR TO SURGERY. MEN MAY SHAVE FACE AND NECK.  DO NOT [place]. [facility] IS NOT RESPONSIBLE FOR VALUABLES.  CONTACTS, DENTURES OR PARTIALS MAY NOT BE WORN TO SURGERY. LEAVE SUITCASE IN CAR. CAN BE BROUGHT TO ROOM AFTER SURGERY.  PATIENTS DISCHARGED THE DAY OF SURGERY WILL NOT BE ALLOWED TO DRIVE HOME.  PLEASE READ OVER THE FOLLOWING INSTRUCTION  SHEETS _________________________________________________________________________________                                          [facility] - PREPARING FOR SURGERY  Before surgery, you can play an important role.  Because  skin is not sterile, your skin needs to be as free of germs as possible.  You can reduce the number of germs on your skin by washing with CHG (chlorahexidine gluconate) soap before surgery.  CHG is an antiseptic cleaner which kills germs and bonds with the skin to continue killing germs even after washing. Please DO NOT use if you have an allergy to CHG or antibacterial soaps.  If your skin becomes reddened/irritated stop using the CHG and inform your nurse when you arrive at Short Stay. Do not shave (including legs and underarms) for at least 48 hours prior to the first CHG shower.  You may shave your face. Please follow these instructions carefully:   1.  Shower with CHG Soap the night before surgery and the  morning of Surgery.   2.  If you choose to wash your hair, wash your hair first as usual with your  normal  Shampoo.   3.  After you shampoo, rinse your hair and body thoroughly to remove the  shampoo.                                         4.  Use CHG as you would any other liquid soap.  You can apply chg directly  to the skin and wash . Gently wash with scrungie or clean wascloth    5.  Apply the CHG Soap to your body ONLY FROM THE NECK DOWN.   Do not use on open                           Wound or open sores. Avoid contact with eyes, ears mouth and genitals (private parts).                        Genitals (private parts) with your normal soap.              6.  Wash thoroughly, paying special attention to the area where your surgery  will be performed.   7.  Thoroughly rinse your body with warm water from the neck down.   8.  DO NOT shower/wash with your normal soap after using and rinsing off  the CHG Soap .                9.  Pat yourself dry with a clean  towel.             10.  Wear clean night clothes to bed after shower             11.  Place clean sheets on your bed the night of your first shower and do not  sleep with pets.  Day of Surgery : Do not apply any lotions/deodorants the morning of surgery.  Please wear clean clothes to the hospital/surgery center.  FAILURE TO FOLLOW THESE INSTRUCTIONS MAY RESULT IN THE CANCELLATION OF YOUR SURGERY    PATIENT SIGNATURE_________________________________  ______________________________________________________________________

## 2015-05-08 NOTE — H&P (Signed)
History of Present Illness [name] [name] is a 74 year old previously followed by Dr. Tresa Endo. 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 He was noted to have a recurrence in 2013 and underwent TURBT in July 2013. He was also incidentally noted to have a stricture of the distal left ureter on RPG studies at that time and a brush biopsy was performed. He was found to have high grade Ta urothelial carcinoma in February 2016.    Oct 2007: TURBT, Low grade Ta  Jul 2013: TURBT (right bladder neck 1.5 cm tumor, low grade Ta),brush biopsy of distal left ureteral stricture (no malignancy)  Mar 2015: TURBT - Low grade Ta  Feb 2016: TURBT - High grade Ta, [facility]  Mar-Apr 2016: Induction BCG  Jul 2016: Maintenance BCG    2) Nephrolithiasis: He has a long standing history of calcium oxalate stones. He was incidentally noted to have a large partial staghorn calculus during retrograde pyelography in February 2015.     Apr 2015: L PCNL    3) Prostate cancer screening: We discontinued screening in 2014 due to his low risk for prostate cancer at that time and progressive dementia.  Last PSA: 0.35 (June 2014)    4) BPH/LUTS: He is treated with Avodart.    Past Medical History Problems  1. History of diabetes mellitus (Z86.39) 2. History of hypertension (Z86.79) 3. History of kidney stones [id number])  Surgical History Problems  1. History of Bladder Injection Of Cancer Treatment 2. History of Cystoscopy With Fulguration 3. History of Cystoscopy With Fulguration Medium Lesion (2-5cm) 4. History of Cystoscopy With Fulguration Small Lesion (5-42mm) 5. History of Cystoscopy With Fulguration Small Lesion (5-61mm) 6. History of Cystoscopy With Insertion Of Ureteral Stent Left 7. History of Cystoscopy With Insertion Of Ureteral Stent Left 8. History  of Cystoscopy With Insertion Of Ureteral Stent Right 9. History of Cystoscopy With Insertion Of Ureteral Stent Right 10. History of Cystoscopy With Pyeloscopy With Lithotripsy 11. History of Cystoscopy With Ureteroscopy Left 12. History of Cystoscopy With Ureteroscopy With Removal Of Calculus 13. History of Percutaneous Lithotomy For Stone Over 2cm.  Current Meds 1. Avodart 0.5 MG Oral Capsule; Take 1 capsule by mouth once daily;  Therapy: [id number] to (Evaluate:11Aug2016)  Requested for: [phone]; Last  Rx:17Aug2015 Ordered 2. Fish Oil CAPS;  Therapy: (Recorded:16Feb2010) to Recorded 3. Fluorouracil 5 % External Cream;  Therapy: [id number] to Recorded 4. Fluticasone Propionate 0.05 % External Cream;  Therapy: [id number] to Recorded 5. Hydrochlorothiazide 25 MG Oral Tablet;  Therapy: (Recorded:29Nov2007) to Recorded 6. MetFORMIN HCl - 500 MG Oral Tablet;  Therapy: (Recorded:05Mar2014) to Recorded 7. Namenda XR 28 MG Oral Capsule Extended Release 24 Hour;  Therapy: 27Feb2014 to Recorded 8. Onglyza 5 MG Oral Tablet;  Therapy: [id number] to Recorded 9. Perindopril Erbumine 4 MG Oral Tablet;  Therapy: (Recorded:05Mar2014) to Recorded 10. Potassium Citrate ER 10 MEQ (1080 MG) Oral Tablet Extended Release; Take 1 tablet by   mouth 3 times a day;   Therapy: [id number] to (Evaluate:02Nov2016)  Requested for: [id number]; Last   Rx:05Jul2016 Ordered 11. Vitamin B Complex TABS;   Therapy: (Recorded:16Feb2010) to Recorded 12. Vitamin B12 TABS;   Therapy: (Recorded:16Feb2010) to Recorded 13. Vitamin C TABS;   Therapy: (Recorded:16Feb2010) to Recorded 14. Vitamin E TABS;   Therapy: (Recorded:16Feb2010) to Recorded  Allergies Medication  1. Nitrofurantoin Monohyd Macro  CAPS 2. Sulfa Drugs 3. Tetracyclines  Family History Problems  1. No pertinent family history : Mother 2. Denied: Family history of Prostate Cancer  Social History Problems  1. Never A Smoker    Physical  Exam Constitutional: Well nourished and well developed . No acute distress.  ENT:. The ears and nose are normal in appearance.  Neck: The appearance of the neck is normal and no neck mass is present.  Pulmonary: No respiratory distress and normal respiratory rhythm and effort.     Assessment Assessed  1. Malignant neoplasm of overlapping sites of bladder (C67.8)    Discussion/Summary 1. Recurrent urothelial carcinoma of the bladder: He was recently noted to have recurrent bladder tumors. He will undergo cystoscopy under anesthesia with bilateral retrograde pyelography and transurethral resection of his bladder tumors. We reviewed potential risks and complications as well as the expected postoperative recovery process. Considering his history of high-grade disease and multiple tumors, we will not proceed with postoperative mitomycin as he will at least need intravesical BCG and may need to restart induction therapy.

## 2015-05-13 ENCOUNTER — Encounter (HOSPITAL_COMMUNITY)
Admission: RE | Disposition: A | Discharge status: Home / Self Care | Payer: Self-pay | Source: Ambulatory Visit | Attending: Urology

## 2015-05-13 ENCOUNTER — Encounter (HOSPITAL_COMMUNITY): Payer: Self-pay | Admitting: *Deleted

## 2015-05-13 ENCOUNTER — Ambulatory Visit (HOSPITAL_COMMUNITY)
Admission: RE | Admit: 2015-05-13 | Discharge: 2015-05-13 | Disposition: A | Discharge status: Home / Self Care | Payer: Medicare Other | Source: Ambulatory Visit | Attending: Urology | Admitting: Urology

## 2015-05-13 ENCOUNTER — Ambulatory Visit (HOSPITAL_COMMUNITY): Payer: Medicare Other | Admitting: Registered Nurse

## 2015-05-13 DIAGNOSIS — I10 Essential (primary) hypertension: Secondary | ICD-10-CM | POA: Diagnosis not present

## 2015-05-13 DIAGNOSIS — Z85528 Personal history of other malignant neoplasm of kidney: Secondary | ICD-10-CM | POA: Insufficient documentation

## 2015-05-13 DIAGNOSIS — N401 Enlarged prostate with lower urinary tract symptoms: Secondary | ICD-10-CM | POA: Insufficient documentation

## 2015-05-13 DIAGNOSIS — C678 Malignant neoplasm of overlapping sites of bladder: Secondary | ICD-10-CM | POA: Insufficient documentation

## 2015-05-13 DIAGNOSIS — C679 Malignant neoplasm of bladder, unspecified: Secondary | ICD-10-CM | POA: Diagnosis not present

## 2015-05-13 DIAGNOSIS — N189 Chronic kidney disease, unspecified: Secondary | ICD-10-CM | POA: Diagnosis not present

## 2015-05-13 DIAGNOSIS — E119 Type 2 diabetes mellitus without complications: Secondary | ICD-10-CM | POA: Diagnosis not present

## 2015-05-13 DIAGNOSIS — Z79899 Other long term (current) drug therapy: Secondary | ICD-10-CM | POA: Insufficient documentation

## 2015-05-13 DIAGNOSIS — F0390 Unspecified dementia without behavioral disturbance: Secondary | ICD-10-CM | POA: Insufficient documentation

## 2015-05-13 DIAGNOSIS — Z7984 Long term (current) use of oral hypoglycemic drugs: Secondary | ICD-10-CM | POA: Insufficient documentation

## 2015-05-13 DIAGNOSIS — I129 Hypertensive chronic kidney disease with stage 1 through stage 4 chronic kidney disease, or unspecified chronic kidney disease: Secondary | ICD-10-CM | POA: Diagnosis not present

## 2015-05-13 HISTORY — PX: CYSTOSCOPY W/ RETROGRADES: SHX1426

## 2015-05-13 HISTORY — PX: TRANSURETHRAL RESECTION OF BLADDER TUMOR: SHX2575

## 2015-05-13 LAB — GLUCOSE, CAPILLARY: Glucose-Capillary: 156 mg/dL — ABNORMAL HIGH (ref 65–99)

## 2015-05-13 SURGERY — CYSTOSCOPY, WITH RETROGRADE PYELOGRAM
Anesthesia: General

## 2015-05-13 MED ORDER — PROPOFOL 10 MG/ML IV BOLUS
INTRAVENOUS | Status: DC | PRN
  Administered 2015-05-13: 170 mg via INTRAVENOUS

## 2015-05-13 MED ORDER — SODIUM CHLORIDE 0.9 % IR SOLN
Status: DC | PRN
  Administered 2015-05-13: 9000 mL

## 2015-05-13 MED ORDER — ONDANSETRON HCL 4 MG/2ML IJ SOLN
INTRAMUSCULAR | Status: DC | PRN
  Administered 2015-05-13: 4 mg via INTRAVENOUS

## 2015-05-13 MED ORDER — DEXAMETHASONE SODIUM PHOSPHATE 10 MG/ML IJ SOLN
INTRAMUSCULAR | Status: AC
  Filled 2015-05-13: qty 1

## 2015-05-13 MED ORDER — LIDOCAINE HCL (CARDIAC) 10 MG/ML IV SOLN
INTRAVENOUS | Status: DC | PRN
  Administered 2015-05-13: 100 mg via INTRAVENOUS

## 2015-05-13 MED ORDER — LACTATED RINGERS IV SOLN
INTRAVENOUS | Status: DC | PRN
  Administered 2015-05-13 (×2): via INTRAVENOUS

## 2015-05-13 MED ORDER — CIPROFLOXACIN IN D5W 400 MG/200ML IV SOLN
400.0000 mg | INTRAVENOUS | Status: AC
  Administered 2015-05-13: 400 mg via INTRAVENOUS

## 2015-05-13 MED ORDER — LIDOCAINE HCL (CARDIAC) 20 MG/ML IV SOLN
INTRAVENOUS | Status: AC
  Filled 2015-05-13: qty 5

## 2015-05-13 MED ORDER — CIPROFLOXACIN IN D5W 400 MG/200ML IV SOLN
INTRAVENOUS | Status: AC
  Filled 2015-05-13: qty 200

## 2015-05-13 MED ORDER — FENTANYL CITRATE (PF) 100 MCG/2ML IJ SOLN
INTRAMUSCULAR | Status: DC | PRN
  Administered 2015-05-13 (×2): 50 ug via INTRAVENOUS

## 2015-05-13 MED ORDER — PROMETHAZINE HCL 25 MG/ML IJ SOLN: 6.2500 mg | INTRAMUSCULAR | Status: DC | PRN

## 2015-05-13 MED ORDER — PROPOFOL 10 MG/ML IV BOLUS
INTRAVENOUS | Status: AC
  Filled 2015-05-13: qty 20

## 2015-05-13 MED ORDER — LACTATED RINGERS IV SOLN
INTRAVENOUS | Status: DC
  Administered 2015-05-13: 1000 mL via INTRAVENOUS

## 2015-05-13 MED ORDER — ONDANSETRON HCL 4 MG/2ML IJ SOLN
INTRAMUSCULAR | Status: AC
  Filled 2015-05-13: qty 2

## 2015-05-13 MED ORDER — FENTANYL CITRATE (PF) 250 MCG/5ML IJ SOLN
INTRAMUSCULAR | Status: AC
  Filled 2015-05-13: qty 5

## 2015-05-13 MED ORDER — MIDAZOLAM HCL 2 MG/2ML IJ SOLN
INTRAMUSCULAR | Status: AC
  Filled 2015-05-13: qty 2

## 2015-05-13 MED ORDER — PHENAZOPYRIDINE HCL 200 MG PO TABS: 200.0000 mg | ORAL_TABLET | Freq: Three times a day (TID) | ORAL | Status: DC | PRN

## 2015-05-13 MED ORDER — IOHEXOL 300 MG/ML  SOLN
INTRAMUSCULAR | Status: DC | PRN
  Administered 2015-05-13: 40 mL

## 2015-05-13 MED ORDER — FENTANYL CITRATE (PF) 100 MCG/2ML IJ SOLN: 25.0000 ug | INTRAMUSCULAR | Status: DC | PRN

## 2015-05-13 SURGICAL SUPPLY — 16 items
BAG URINE DRAINAGE (UROLOGICAL SUPPLIES) IMPLANT
BAG URO CATCHER STRL LF (DRAPE) ×4 IMPLANT
CATH INTERMIT  6FR 70CM (CATHETERS) ×4 IMPLANT
ELECT REM PT RETURN 9FT ADLT (ELECTROSURGICAL) ×4
ELECTRODE REM PT RTRN 9FT ADLT (ELECTROSURGICAL) ×2 IMPLANT
EVACUATOR MICROVAS BLADDER (UROLOGICAL SUPPLIES) IMPLANT
GLOVE BIOGEL M STRL SZ7.5 (GLOVE) ×4 IMPLANT
GOWN STRL REUS W/TWL LRG LVL3 (GOWN DISPOSABLE) ×4 IMPLANT
GUIDEWIRE STR DUAL SENSOR (WIRE) ×4 IMPLANT
KIT ASPIRATION TUBING (SET/KITS/TRAYS/PACK) IMPLANT
LOOP CUT BIPOLAR 24F LRG (ELECTROSURGICAL) ×4 IMPLANT
MANIFOLD NEPTUNE II (INSTRUMENTS) ×4 IMPLANT
PACK CYSTO (CUSTOM PROCEDURE TRAY) ×4 IMPLANT
SYRINGE IRR TOOMEY STRL 70CC (SYRINGE) ×4 IMPLANT
TUBING CONNECTING 10 (TUBING) ×3 IMPLANT
TUBING CONNECTING 10' (TUBING) ×1

## 2015-05-13 NOTE — Anesthesia Postprocedure Evaluation (Signed)
Anesthesia Post Note  Patient: [name] [name]  Procedure(s) Performed: Procedure(s) (LRB): CYSTOSCOPY WITH RETROGRADE PYELOGRAM (Bilateral) TRANSURETHRAL RESECTION OF BLADDER TUMOR (TURBT) (N/A)  Patient location during evaluation: PACU Anesthesia Type: General Level of consciousness: sedated Pain management: pain level controlled Vital Signs Assessment: post-procedure vital signs reviewed and stable Respiratory status: spontaneous breathing and respiratory function stable Cardiovascular status: stable Anesthetic complications: no    Last Vitals:  Filed Vitals:   05/13/15 1630 05/13/15 1645  BP: 146/76 152/97  Pulse: 80 84  Temp: 36.4 C   Resp: 14 12    Last Pain: There were no vitals filed for this visit.               Chris Cripps DANIEL

## 2015-05-13 NOTE — Op Note (Addendum)
Preoperative diagnosis: 1. Bladder tumors (largest - 2 cm)  Postoperative diagnosis:  1. Bladder tumor (largest- 2 cm)  Procedure:  1. Cystoscopy 2. Transurethral resection of bladder tumors 3. Bilateral retrograde pyelography with interpretation 4. Dilation of urethra  Surgeon: Pryor Curia. M.D.  Resident: Dr. Gracy Racer  Anesthesia: General  Complications: None  Intraoperative findings:  1. Bladder tumor: There were noted to be six papillary appearing tumors toward the right of the trigone. 2. Retrograde pyelography: A 6 Fr catheter was used to inject omnipaque contrast. Left retrograde pyelography demonstrated no renal collecting system or ureteral filling defects.  Right retrograde pyelography demonstrated no renal collecting system or ureteral filling defects.  EBL: Minimal  Specimens: 1. Bladder tumors 2. Base of bladder tumors  Disposition of specimens: Pathology  Indication: [name] [name] is a patient who was found to have recurrent bladder tumors on surveillance cytsocopy.  He has a history of high risk non muscle invasive urothelial carcinoma of the bladder s/p induction and maintenance BCG therapy. After reviewing the management options for treatment, he elected to proceed with the above surgical procedure(s). We have discussed the potential benefits and risks of the procedure, side effects of the proposed treatment, the likelihood of the patient achieving the goals of the procedure, and any potential problems that might occur during the procedure or recuperation. Informed consent has been obtained.  Description of procedure:  The patient was taken to the operating room and general anesthesia was induced.  The patient was placed in the dorsal lithotomy position, prepped and draped in the usual sterile fashion, and preoperative antibiotics were administered. A preoperative time-out was performed.   Cystourethroscopy was performed after dilation of the  urethra with male sounds.  The patient's urethra was examined and was normal.     The bladder was then systematically examined in its entirety with both a 30 degree and 70 degree lens.  There were six small bladder tumors with the largest measuring 2.0 cm located to the right of the trigone and extending onto the lateral bladder wall.  No other suspicious lesions were noted.  Attention then turned to the right ureteral orifice and a ureteral catheter was used to intubate the ureteral orifice.  Omnipaque contrast was injected through the ureteral catheter and a retrograde pyelogram was performed with findings as dictated above.  Attention then turned to the left ureteral orifice and a ureteral catheter was used to intubate the ureteral orifice.  Omnipaque contrast was injected through the ureteral catheter and a retrograde pyelogram was performed with findings as dictated above.  The bladder was then re-examined after the resectoscope was placed.  The bladder tumors were identified.  Using loop bipolar resection, all visible tumorw were resected and removed for permanent pathologic analysis.   Separate biopsies were also taken of the underlying deep bladder tissue and sent as a separate specimen.   Hemostasis was then achieved with the loop cautery and the bladder was emptied and reinspected with no further bleeding noted at the end of the procedure.    The bladder was then emptied and the procedure ended.  The patient appeared to tolerate the procedure well and without complications.  The patient was able to be awakened and transferred to the recovery unit in satisfactory condition.    Pryor Curia MD

## 2015-05-13 NOTE — Discharge Instructions (Addendum)
Transurethral Resection of Bladder Tumor (TURBT)  Definition:  Transurethral Resection of the Bladder Tumor is a surgical procedure used to diagnose and remove tumors within the bladder. TURBT is the most common treatment for early stage bladder cancer.  General instructions:  Your recent bladder surgery requires very little post hospital care but some definite precautions.  Despite the fact that no skin incisions were used, the area around the bladder resection site are raw and covered with scabs to promote healing and prevent bleeding. Certain precautions are needed to insure that the scabs are not disturbed over the next 2-4 weeks while the healing proceeds.  Because the raw surface inside your bladder and the irritating effects of urine you may expect frequency of urination and/or urgency (a stronger desire to urinate) and perhaps even getting up at night more often. This will usually resolve or improve slowly over the healing period. You may see some blood in your urine over the first 6 weeks. Do not be alarmed, even if the urine was clear for a while. Get off your feet and drink lots of fluids until clearing occurs. If you start to pass clots or don't improve call us.  Diet:  You may return to your normal diet immediately. Because of the raw surface of your bladder, alcohol, spicy foods, foods high in acid and drinks with caffeine may cause irritation or frequency and should be used in moderation. To keep your urine flowing freely and avoid constipation, drink plenty of fluids during the day (8-10 glasses). Tip: Avoid cranberry juice because it is very acidic.  Activity:  Your physical activity doesn't need to be restricted. However, if you are very active, you may see some blood in the urine. We suggest that you reduce your activity under the circumstances until the bleeding has stopped.  Bowels:  It is important to keep your bowels regular during the postoperative period. Straining with bowel  movements can cause bleeding. A bowel movement every other day is reasonable. Use a mild laxative if needed, such as milk of magnesia 2-3 tablespoons, or 2 Dulcolax tablets. Call if you continue to have problems. If you had been taking narcotics for pain, before, during or after your surgery, you may be constipated. Take a laxative if necessary.  Medication:  You should resume your pre-surgery medications unless told not to. In addition you may be given an antibiotic to prevent or treat infection. Antibiotics are not always necessary. All medication should be taken as prescribed until the bottles are finished unless you are having an unusual reaction to one of the drugs.  General Anesthetic, Adult  A doctor specialized in giving anesthesia (anesthesiologist) or a nurse specialized in giving anesthesia (nurse anesthetist) gives medicine that makes you sleep while a procedure is performed (general anesthetic). Once the general anesthetic has been administered, you will be in a sleeplike state in which you feel no pain. After having a general anesthetic you may feel:  Dizzy.  Weak.  Drowsy.  Confused.  These feelings are normal and can be expected to last for up to 24 hours after the procedure.   LET YOUR CAREGIVER KNOW ABOUT:  Allergies you have.  Medications you are taking, including herbs, eye drops, over the counter medications, dietary supplements, and creams.  Previous problems you have had with anesthetics or numbing medicines.  Use of cigarettes, alcohol, or illicit drugs.  Possibility of pregnancy, if this applies.  History of bleeding or blood disorders, including blood clots and clotting disorders.  Previous surgeries you have had and types of anesthetics you have received.  Family medical history, especially anesthetic problems.  Other health problems.  AFTER THE PROCEDURE  After surgery, you will be taken to the recovery area where a nurse will monitor your progress. You will be  allowed to go home when you are awake, stable, taking fluids well, and without serious pain or complications.  For the first 24 hours following an anesthetic:  Have a responsible person with you.  Do not drive a car. If you are alone, do not take public transportation.  Do not engage in strenuous activity. You may usually resume normal activities the next day, or as advised by your caregiver.  Do not drink alcohol.  Do not take medicine that has not been prescribed by your caregiver.  Do not sign important papers or make important decisions as your judgement may be impaired.  You may resume a normal diet as directed.  Change bandages (dressings) as directed.  Only take over-the-counter or prescription medicines for pain, discomfort, or fever as directed by your caregiver.  If you have questions or problems that seem related to the anesthetic, call the hospital and ask for the anesthetist, anesthesiologist, or anesthesia department.  SEEK IMMEDIATE MEDICAL CARE IF:  You develop a rash.  You have difficulty breathing.  You have chest pain.  You have allergic problems.  You have uncontrolled nausea.  You have uncontrolled vomiting.  You develop any serious bleeding, especially from the incision site.  Document Released: 09/08/2007 Document Revised: 02/11/2011 Document Reviewed: 10/02/2010  [facility] Patient Information 2012 [place].       General Anesthesia, Adult, Care After Refer to this sheet in the next few weeks. These instructions provide you with information on caring for yourself after your procedure. Your health care provider may also give you more specific instructions. Your treatment has been planned according to current medical practices, but problems sometimes occur. Call your health care provider if you have any problems or questions after your procedure. WHAT TO EXPECT AFTER THE PROCEDURE After the procedure, it is typical to experience: Sleepiness. Nausea and  vomiting. HOME CARE INSTRUCTIONS For the first 24 hours after general anesthesia: Have a responsible person with you. Do not drive a car. If you are alone, do not take public transportation. Do not drink alcohol. Do not take medicine that has not been prescribed by your health care provider. Do not sign important papers or make important decisions. You may resume a normal diet and activities as directed by your health care provider. Change bandages (dressings) as directed. If you have questions or problems that seem related to general anesthesia, call the hospital and ask for the anesthetist or anesthesiologist on call. SEEK MEDICAL CARE IF: You have nausea and vomiting that continue the day after anesthesia. You develop a rash. SEEK IMMEDIATE MEDICAL CARE IF:  You have difficulty breathing. You have chest pain. You have any allergic problems.   This information is not intended to replace advice given to you by your health care provider. Make sure you discuss any questions you have with your health care provider.   Document Released: 09/07/2000 Document Revised: 06/22/2014 Document Reviewed: 09/30/2011 Elsevier Interactive Patient Education [employer].

## 2015-05-13 NOTE — Anesthesia Procedure Notes (Signed)
Procedure Name: LMA Insertion Date/Time: 05/13/2015 3:27 PM Performed by: Enrigue Catena E Pre-anesthesia Checklist: Patient identified, Emergency Drugs available, Suction available and Patient being monitored Patient Re-evaluated:Patient Re-evaluated prior to inductionOxygen Delivery Method: Circle System Utilized Preoxygenation: Pre-oxygenation with 100% oxygen Intubation Type: IV induction Ventilation: Mask ventilation without difficulty LMA: LMA with gastric port inserted LMA Size: 5.0 Tube type: Oral Number of attempts: 1 Placement Confirmation: ETT inserted through vocal cords under direct vision,  positive ETCO2 and breath sounds checked- equal and bilateral Tube secured with: Tape Dental Injury: Teeth and Oropharynx as per pre-operative assessment

## 2015-05-13 NOTE — Anesthesia Preprocedure Evaluation (Signed)
Anesthesia Evaluation  Patient identified by MRN, date of birth, ID band Patient awake    Reviewed: Allergy & Precautions, NPO status , Patient's Chart, lab work & pertinent test results  Airway Mallampati: II  TM Distance: >3 FB Neck ROM: Full    Dental no notable dental hx.    Pulmonary neg pulmonary ROS,    Pulmonary exam normal breath sounds clear to auscultation       Cardiovascular Exercise Tolerance: Good hypertension, Pt. on medications and Pt. on home beta blockers Normal cardiovascular exam Rhythm:Regular Rate:Normal     Neuro/Psych PSYCHIATRIC DISORDERS negative neurological ROS     GI/Hepatic negative GI ROS, Neg liver ROS,   Endo/Other  diabetes, Type 2, Oral Hypoglycemic Agents  Renal/GU Renal disease  negative genitourinary   Musculoskeletal negative musculoskeletal ROS (+)   Abdominal   Peds negative pediatric ROS (+)  Hematology negative hematology ROS (+)   Anesthesia Other Findings   Reproductive/Obstetrics negative OB ROS                             Anesthesia Physical Anesthesia Plan  ASA: III  Anesthesia Plan: General   Post-op Pain Management:    Induction: Intravenous  Airway Management Planned: LMA  Additional Equipment:   Intra-op Plan:   Post-operative Plan: Extubation in OR  Informed Consent: I have reviewed the patients History and Physical, chart, labs and discussed the procedure including the risks, benefits and alternatives for the proposed anesthesia with the patient or authorized representative who has indicated his/her understanding and acceptance.   Dental advisory given  Plan Discussed with: CRNA  Anesthesia Plan Comments:         Anesthesia Quick Evaluation

## 2015-05-13 NOTE — Transfer of Care (Signed)
Immediate Anesthesia Transfer of Care Note  Patient: [name] [name]  Procedure(s) Performed: Procedure(s): CYSTOSCOPY WITH RETROGRADE PYELOGRAM (Bilateral) TRANSURETHRAL RESECTION OF BLADDER TUMOR (TURBT) (N/A)  Patient Location: PACU  Anesthesia Type:General  Level of Consciousness: awake, alert , patient cooperative and responds to stimulation  Airway & Oxygen Therapy: Patient Spontanous Breathing and Patient connected to face mask oxygen  Post-op Assessment: Report given to RN, Post -op Vital signs reviewed and stable and Patient moving all extremities X 4  Post vital signs: stable  Last Vitals: There were no vitals filed for this visit.  Complications: No apparent anesthesia complications

## 2015-05-14 ENCOUNTER — Encounter (HOSPITAL_COMMUNITY): Payer: Self-pay | Admitting: Urology

## 2015-05-15 DIAGNOSIS — H524 Presbyopia: Secondary | ICD-10-CM | POA: Diagnosis not present

## 2015-05-15 DIAGNOSIS — Z961 Presence of intraocular lens: Secondary | ICD-10-CM | POA: Diagnosis not present

## 2015-05-15 DIAGNOSIS — E119 Type 2 diabetes mellitus without complications: Secondary | ICD-10-CM | POA: Diagnosis not present

## 2015-05-21 DIAGNOSIS — C678 Malignant neoplasm of overlapping sites of bladder: Secondary | ICD-10-CM | POA: Diagnosis not present

## 2015-05-31 DIAGNOSIS — R3914 Feeling of incomplete bladder emptying: Secondary | ICD-10-CM | POA: Diagnosis not present

## 2015-06-06 DIAGNOSIS — C678 Malignant neoplasm of overlapping sites of bladder: Secondary | ICD-10-CM | POA: Diagnosis not present

## 2015-06-06 DIAGNOSIS — Z5111 Encounter for antineoplastic chemotherapy: Secondary | ICD-10-CM | POA: Diagnosis not present

## 2015-06-13 DIAGNOSIS — Z5111 Encounter for antineoplastic chemotherapy: Secondary | ICD-10-CM | POA: Diagnosis not present

## 2015-06-13 DIAGNOSIS — C678 Malignant neoplasm of overlapping sites of bladder: Secondary | ICD-10-CM | POA: Diagnosis not present

## 2015-06-20 DIAGNOSIS — Z5111 Encounter for antineoplastic chemotherapy: Secondary | ICD-10-CM | POA: Diagnosis not present

## 2015-06-20 DIAGNOSIS — C678 Malignant neoplasm of overlapping sites of bladder: Secondary | ICD-10-CM | POA: Diagnosis not present

## 2015-06-20 DIAGNOSIS — Z0000 Encounter for general adult medical examination without abnormal findings: Secondary | ICD-10-CM | POA: Diagnosis not present

## 2015-06-27 DIAGNOSIS — C678 Malignant neoplasm of overlapping sites of bladder: Secondary | ICD-10-CM | POA: Diagnosis not present

## 2015-06-27 DIAGNOSIS — Z5111 Encounter for antineoplastic chemotherapy: Secondary | ICD-10-CM | POA: Diagnosis not present

## 2015-06-27 DIAGNOSIS — Z0000 Encounter for general adult medical examination without abnormal findings: Secondary | ICD-10-CM | POA: Diagnosis not present

## 2015-06-28 DIAGNOSIS — E789 Disorder of lipoprotein metabolism, unspecified: Secondary | ICD-10-CM | POA: Diagnosis not present

## 2015-06-28 DIAGNOSIS — E118 Type 2 diabetes mellitus with unspecified complications: Secondary | ICD-10-CM | POA: Diagnosis not present

## 2015-07-04 DIAGNOSIS — C678 Malignant neoplasm of overlapping sites of bladder: Secondary | ICD-10-CM | POA: Diagnosis not present

## 2015-07-04 DIAGNOSIS — Z5111 Encounter for antineoplastic chemotherapy: Secondary | ICD-10-CM | POA: Diagnosis not present

## 2015-07-04 DIAGNOSIS — Z0000 Encounter for general adult medical examination without abnormal findings: Secondary | ICD-10-CM | POA: Diagnosis not present

## 2015-07-08 DIAGNOSIS — I10 Essential (primary) hypertension: Secondary | ICD-10-CM | POA: Diagnosis not present

## 2015-07-08 DIAGNOSIS — C679 Malignant neoplasm of bladder, unspecified: Secondary | ICD-10-CM | POA: Diagnosis not present

## 2015-07-08 DIAGNOSIS — E118 Type 2 diabetes mellitus with unspecified complications: Secondary | ICD-10-CM | POA: Diagnosis not present

## 2015-07-18 DIAGNOSIS — Z0000 Encounter for general adult medical examination without abnormal findings: Secondary | ICD-10-CM | POA: Diagnosis not present

## 2015-07-18 DIAGNOSIS — C678 Malignant neoplasm of overlapping sites of bladder: Secondary | ICD-10-CM | POA: Diagnosis not present

## 2015-07-25 DIAGNOSIS — L570 Actinic keratosis: Secondary | ICD-10-CM | POA: Diagnosis not present

## 2015-07-25 DIAGNOSIS — C44329 Squamous cell carcinoma of skin of other parts of face: Secondary | ICD-10-CM | POA: Diagnosis not present

## 2015-09-17 DIAGNOSIS — Z125 Encounter for screening for malignant neoplasm of prostate: Secondary | ICD-10-CM | POA: Diagnosis not present

## 2015-09-17 DIAGNOSIS — E118 Type 2 diabetes mellitus with unspecified complications: Secondary | ICD-10-CM | POA: Diagnosis not present

## 2015-09-17 DIAGNOSIS — I10 Essential (primary) hypertension: Secondary | ICD-10-CM | POA: Diagnosis not present

## 2015-09-20 DIAGNOSIS — E118 Type 2 diabetes mellitus with unspecified complications: Secondary | ICD-10-CM | POA: Diagnosis not present

## 2015-09-20 DIAGNOSIS — C679 Malignant neoplasm of bladder, unspecified: Secondary | ICD-10-CM | POA: Diagnosis not present

## 2015-09-20 DIAGNOSIS — I10 Essential (primary) hypertension: Secondary | ICD-10-CM | POA: Diagnosis not present

## 2015-10-25 DIAGNOSIS — C678 Malignant neoplasm of overlapping sites of bladder: Secondary | ICD-10-CM | POA: Diagnosis not present

## 2015-10-25 DIAGNOSIS — Z0000 Encounter for general adult medical examination without abnormal findings: Secondary | ICD-10-CM | POA: Diagnosis not present

## 2015-10-28 ENCOUNTER — Other Ambulatory Visit: Payer: Self-pay | Admitting: Urology

## 2015-11-04 DIAGNOSIS — L570 Actinic keratosis: Secondary | ICD-10-CM | POA: Diagnosis not present

## 2015-11-04 DIAGNOSIS — C44319 Basal cell carcinoma of skin of other parts of face: Secondary | ICD-10-CM | POA: Diagnosis not present

## 2015-11-04 DIAGNOSIS — C44329 Squamous cell carcinoma of skin of other parts of face: Secondary | ICD-10-CM | POA: Diagnosis not present

## 2015-11-12 DIAGNOSIS — C44329 Squamous cell carcinoma of skin of other parts of face: Secondary | ICD-10-CM | POA: Diagnosis not present

## 2015-11-12 DIAGNOSIS — D485 Neoplasm of uncertain behavior of skin: Secondary | ICD-10-CM | POA: Diagnosis not present

## 2015-11-12 DIAGNOSIS — L98499 Non-pressure chronic ulcer of skin of other sites with unspecified severity: Secondary | ICD-10-CM | POA: Diagnosis not present

## 2015-11-12 NOTE — Patient Instructions (Addendum)
[name] [name]  11/12/2015   Your procedure is scheduled on: 11-18-15  Report to [facility] Main  Entrance take [facility]  elevators to 3rd floor to  [place] at 245 pm  Call this number if you have problems the morning of surgery [phone]   Remember: ONLY 1 PERSON MAY GO WITH YOU TO SHORT STAY TO GET  READY MORNING OF YOUR SURGERY.  Do not eat food After Midnight, MAY HAVE CLEAR LIQUIDS FROM MIDNIGHT UNTIL 1030 AM DAT OF SURGERY, NOTHING BY MOUTH AFTER 1030 AM DAY OF SURGERY.     Take these medicines the morning of surgery with A SIP OF WATER: DUTASTERIDE (AVODART), MEMANTINE (NAMENDA), METOPROLOL SUCCINATE  DO NOT TAKE ANY DIABETIC MEDICATIONS DAY OF YOUR SURGERY                               You may not have any metal on your body including hair pins and              piercings  Do not wear jewelry, make-up, lotions, powders or perfumes, deodorant             Do not wear nail polish.  Do not shave  48 hours prior to surgery.              Men may shave face and neck.   Do not bring valuables to the hospital. [place].  Contacts, dentures or bridgework may not be worn into surgery.  Leave suitcase in the car. After surgery it may be brought to your room.     Patients discharged the day of surgery will not be allowed to drive home.  Name and phone number of your driver:wife [name] [name] cell [phone]  Special Instructions: N/A              Please read over the following fact sheets you were given: _____________________________________________________________________                CLEAR LIQUID DIET   Foods Allowed                                                                     Foods Excluded  Coffee and tea, regular and decaf                             liquids that you cannot  Plain Jell-O in any flavor                                             see through such as: Fruit ices (not  with fruit pulp)  milk, soups, orange juice  Iced Popsicles                                    All solid food Carbonated beverages, regular and diet                                    Cranberry, grape and apple juices Sports drinks like Gatorade Lightly seasoned clear broth or consume(fat free) Sugar, honey syrup  Sample Menu Breakfast                                Lunch                                     Supper Cranberry juice                    Beef broth                            Chicken broth Jell-O                                     Grape juice                           Apple juice Coffee or tea                        Jell-O                                      Popsicle                                                Coffee or tea                        Coffee or tea  _____________________________________________________________________  [facility] - Preparing for Surgery Before surgery, you can play an important role.  Because skin is not sterile, your skin needs to be as free of germs as possible.  You can reduce the number of germs on your skin by washing with CHG (chlorahexidine gluconate) soap before surgery.  CHG is an antiseptic cleaner which kills germs and bonds with the skin to continue killing germs even after washing. Please DO NOT use if you have an allergy to CHG or antibacterial soaps.  If your skin becomes reddened/irritated stop using the CHG and inform your nurse when you arrive at Short Stay. Do not shave (including legs and underarms) for at least 48 hours prior to the first CHG shower.  You may shave your face/neck. Please follow these instructions carefully:  1.  Shower with CHG Soap the night before surgery and the  morning of Surgery.  2.  If you choose to wash  your hair, wash your hair first as usual with your  normal  shampoo.  3.  After you shampoo, rinse your hair and body thoroughly to remove the  shampoo.                            4.  Use CHG as you would any other liquid soap.  You can apply chg directly  to the skin and wash                       Gently with a scrungie or clean washcloth.  5.  Apply the CHG Soap to your body ONLY FROM THE NECK DOWN.   Do not use on face/ open                           Wound or open sores. Avoid contact with eyes, ears mouth and genitals (private parts).                       Wash face,  Genitals (private parts) with your normal soap.             6.  Wash thoroughly, paying special attention to the area where your surgery  will be performed.  7.  Thoroughly rinse your body with warm water from the neck down.  8.  DO NOT shower/wash with your normal soap after using and rinsing off  the CHG Soap.                9.  Pat yourself dry with a clean towel.            10.  Wear clean pajamas.            11.  Place clean sheets on your bed the night of your first shower and do not  sleep with pets. Day of Surgery : Do not apply any lotions/deodorants the morning of surgery.  Please wear clean clothes to the hospital/surgery center.  FAILURE TO FOLLOW THESE INSTRUCTIONS MAY RESULT IN THE CANCELLATION OF YOUR SURGERY PATIENT SIGNATURE_________________________________  NURSE SIGNATURE__________________________________  ________________________________________________________________________

## 2015-11-12 NOTE — Progress Notes (Signed)
EKG 04-26-15 EPIC LOV NEURO DR Krista Blue 11-20-13 EPIC

## 2015-11-13 ENCOUNTER — Encounter (HOSPITAL_COMMUNITY)
Admission: RE | Admit: 2015-11-13 | Discharge: 2015-11-13 | Disposition: A | Discharge status: Home / Self Care | Payer: Medicare Other | Source: Ambulatory Visit | Attending: Urology | Admitting: Urology

## 2015-11-13 ENCOUNTER — Encounter (HOSPITAL_COMMUNITY): Payer: Self-pay

## 2015-11-13 DIAGNOSIS — Z01812 Encounter for preprocedural laboratory examination: Secondary | ICD-10-CM | POA: Diagnosis not present

## 2015-11-13 DIAGNOSIS — Z79899 Other long term (current) drug therapy: Secondary | ICD-10-CM | POA: Diagnosis not present

## 2015-11-13 DIAGNOSIS — C679 Malignant neoplasm of bladder, unspecified: Secondary | ICD-10-CM | POA: Insufficient documentation

## 2015-11-13 LAB — BASIC METABOLIC PANEL
Anion gap: 10 (ref 5–15)
BUN: 24 mg/dL — AB (ref 6–20)
CHLORIDE: 102 mmol/L (ref 101–111)
CO2: 27 mmol/L (ref 22–32)
Calcium: 9 mg/dL (ref 8.9–10.3)
Creatinine, Ser: 0.8 mg/dL (ref 0.61–1.24)
GFR calc Af Amer: >60 mL/min (ref >60)
GFR calc non Af Amer: >60 mL/min (ref >60)
GLUCOSE: 170 mg/dL — AB (ref 65–99)
POTASSIUM: 3.6 mmol/L (ref 3.5–5.1)
Sodium: 139 mmol/L (ref 135–145)

## 2015-11-13 LAB — CBC
HCT: 41.6 % (ref 39.0–52.0)
HEMOGLOBIN: 14.3 g/dL (ref 13.0–17.0)
MCH: 28.5 pg (ref 26.0–34.0)
MCHC: 34.4 g/dL (ref 30.0–36.0)
MCV: 82.9 fL (ref 78.0–100.0)
PLATELETS: 253 10*3/uL (ref 150–400)
RBC: 5.02 MIL/uL (ref 4.22–5.81)
RDW: 12.6 % (ref 11.5–15.5)
WBC: 10.7 10*3/uL — ABNORMAL HIGH (ref 4.0–10.5)

## 2015-11-14 LAB — HEMOGLOBIN A1C
HEMOGLOBIN A1C: 7.6 % — AB (ref 4.8–5.6)
MEAN PLASMA GLUCOSE: 171 mg/dL

## 2015-11-14 NOTE — Progress Notes (Signed)
BMET RESULTS FAXED TO DR Camden

## 2015-11-15 NOTE — H&P (Signed)
History of Present Illness [name] [name] is a 75 year old previously followed by Dr. Tresa Endo. 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 He was noted to have a recurrence in 2013 and underwent TURBT in July 2013. He was also incidentally noted to have a stricture of the distal left ureter on RPG studies at that time and a brush biopsy was performed. He was found to have high grade Ta urothelial carcinoma in February 2016.    Oct 2007: TURBT, Low grade Ta  Jul 2013: TURBT (right bladder neck 1.5 cm tumor, low grade Ta),brush biopsy of distal left ureteral stricture (no malignancy)  Mar 2015: TURBT - Low grade Ta  Feb 2016: TURBT - High grade Ta, [facility]  Mar-Apr 2016: Induction BCG  Jul 2016: Maintenance BCG  Oct 2016: Multiple recurrent tumors noted on surveillance cystoscopy  Dec 2016: TUR - Multiple tumors over right posterior bladder - High grade, Ta   Dec 2016 - Feb 2017: 6 week induction BCG    2) Nephrolithiasis: He has a long standing history of calcium oxalate stones. He was incidentally noted to have a large partial staghorn calculus during retrograde pyelography in February 2015.     Apr 2015: L PCNL    3) Prostate cancer screening: We discontinued screening in 2014 due to his low risk for prostate cancer at that time and progressive dementia.  Last PSA: 0.35 (June 2014)    4) BPH/LUTS: He is treated with Avodart.    Interval history:    He follows up today for surveillance of his high risk not muscle invasive bladder cancer. He apparently tolerated his repeat induction course of BCG very well. According to his wife, he continues to have fairly severe dementia with good days and bad days. He has not had any hematuria.   Past Medical History Problems  1. History of diabetes mellitus (Z86.39) 2. History of  hypertension (Z86.79) 3. History of kidney stones [id number])  Surgical History Problems  1. History of Bladder Injection Of Cancer Treatment 2. History of Cystoscopy With Fulguration 3. History of Cystoscopy With Fulguration Medium Lesion (2-5cm) 4. History of Cystoscopy With Fulguration Medium Lesion (2-5cm) 5. History of Cystoscopy With Fulguration Small Lesion (5-57mm) 6. History of Cystoscopy With Fulguration Small Lesion (5-48mm) 7. History of Cystoscopy With Insertion Of Ureteral Stent Left 8. History of Cystoscopy With Insertion Of Ureteral Stent Left 9. History of Cystoscopy With Insertion Of Ureteral Stent Right 10. History of Cystoscopy With Insertion Of Ureteral Stent Right 11. History of Cystoscopy With Pyeloscopy With Lithotripsy 12. History of Cystoscopy With Ureteroscopy Left 13. History of Cystoscopy With Ureteroscopy With Removal Of Calculus 14. History of Percutaneous Lithotomy For Stone Over 2cm.  Current Meds 1. Avodart 0.5 MG Oral Capsule; Take 1 capsule by mouth once daily;  Therapy: [id number] to (Evaluate:11Aug2016)  Requested for: [id number]; Last  Rx:17Aug2015 Ordered 2. Fish Oil CAPS;  Therapy: (Recorded:16Feb2010) to Recorded 3. Fluorouracil 5 % External Cream;  Therapy: [id number] to Recorded 4. Fluticasone Propionate 0.05 % External Cream;  Therapy: [id number] to Recorded 5. HydroCHLOROthiazide 25 MG Oral Tablet;  Therapy: (Recorded:29Nov2007) to Recorded 6. MetFORMIN HCl - 500 MG Oral Tablet;  Therapy: (Recorded:05Mar2014) to Recorded 7. Namenda XR 28 MG Oral Capsule Extended Release 24 Hour;  Therapy: 27Feb2014 to Recorded 8. Onglyza 5 MG Oral Tablet;  Therapy: [id number]  to Recorded 9. Perindopril Erbumine 4 MG Oral Tablet;  Therapy: (Recorded:05Mar2014) to Recorded 10. Potassium Citrate ER 10 MEQ (1080 MG) Oral Tablet Extended Release; Take 1 tablet by   mouth 3 times a day;   Therapy: [id number] to (Evaluate:06Jul2017)  Requested for: 07Apr2017;  Last   Rx:07Apr2017 Ordered 11. Vitamin B Complex TABS;   Therapy: (Recorded:16Feb2010) to Recorded 12. Vitamin B12 TABS;   Therapy: (Recorded:16Feb2010) to Recorded 13. Vitamin C TABS;   Therapy: (Recorded:16Feb2010) to Recorded 14. Vitamin E TABS;   Therapy: (Recorded:16Feb2010) to Recorded  Allergies Medication  1. Nitrofurantoin Monohyd Macro CAPS 2. Sulfa Drugs 3. Tetracyclines  Family History Problems  1. No pertinent family history : Mother 2. Denied: Family history of Prostate Cancer  Social History Problems  1. Never A Smoker  Review of Systems  Genitourinary: no hematuria.    Vitals Vital Signs [Data Includes: Last 1 Day]  Recorded: [id number] 02:25PM  Weight: 200 lb  BMI Calculated: 27.89 BSA Calculated: 2.11 Blood Pressure: 153 / 68 Heart Rate: 84  Physical Exam Constitutional: Well nourished and well developed . No acute distress.  ENT:. The ears and nose are normal in appearance.  Neck: The appearance of the neck is normal and no neck mass is present.  Pulmonary: No respiratory distress, normal respiratory rhythm and effort and clear bilateral breath sounds.  Cardiovascular: Heart rate and rhythm are normal . No peripheral edema.  Neuro/Psych:. Mood and affect are appropriate.    Results/Data Urine [Data Includes: Last 1 Day]   [id number]  COLOR YELLOW   APPEARANCE CLEAR   SPECIFIC GRAVITY 1.020   pH 5.5   GLUCOSE TRACE   BILIRUBIN NEGATIVE   KETONE TRACE   BLOOD NEGATIVE   PROTEIN NEGATIVE   NITRITE NEGATIVE   LEUKOCYTE ESTERASE NEGATIVE    Procedure  Procedure: Cystoscopy  Chaperone Present: Heather S.  Indication: History of Urothelial Carcinoma.  Informed Consent: Risks, benefits, and potential adverse events were discussed and informed consent was obtained from the patient.  Prep: The patient was prepped with betadine.  Anesthesia:. Local anesthesia was administered intraurethrally with 2% lidocaine jelly.  Antibiotic prophylaxis:  Ciprofloxacin.  Procedure Note:  Urethral meatus:. No abnormalities.  Anterior urethra: No abnormalities.  Prostatic urethra: No abnormalities.  Bladder: Visulization was clear. The ureteral orifices were in the normal anatomic position bilaterally and had clear efflux of urine. Inspection of the bladder does reveal suspicious areas for the right hemitrigone. This area measures approximately 2 cm in diameter and is suspicious for high-grade disease. No other bladder tumors were noted. A saline bladder washing was obtained and sent for cytologic analysis. The patient tolerated the procedure well.  Complications: None.    Assessment Assessed  1. Malignant neoplasm of overlapping sites of bladder (C67.8)  Plan Health Maintenance  1. UA With REFLEX; [Do Not Release]; Status:Complete;   [id number] 02:12PM Malignant neoplasm of overlapping sites of bladder  2. Follow-up Office  Follow-up  Status: Hold For - Date of Service  Requested for:  [id number] 3. URINE CYTOLOGY; Status:In Progress - Specimen/Data Collected;   Done: [id number]  Discussion/Summary 1. High-risk non-muscle invasive bladder cancer: [name] [name] appears to have another recurrence and I have recommended that he proceed with cystoscopy and transurethral resection for further evaluation. We have reviewed this procedure in detail and the potential risks and complications as well as expected recovery process. 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 I briefly discussed options with his  wife. She does not think he will do well with a cystectomy considering his progressive dementia although he is technically healthy enough to undergo cystectomy. We have discussed the option of trimodality therapy as an alternative as well. Finally, we discussed the option of a potential clinical trial for further intravesical therapy possibly at a tertiary care institution. We will discuss this  further following his upcoming transurethral resection.     A total of 15 minutes were spent in the overall care of the patient today1  with 15 minutes in direct face to face consultation.1      1 Amended By: Raynelle Bring; Oct 25 2015 6:52 PM EST  Verified Results URINE CYTOLOGY1 [id number] 03:19PM1 Read Drivers  SPECIMEN TYPE: OTHER   Test Name Result Flag Reference  FINAL DIAGNOSIS:1  A1   - ABNORMAL FINDINGS. - ATYPICAL UROTHELIAL CELLS ARE PRESENT.  SOURCE:1 Bladder Washing1    110CC OF CLEAR LIGHT YELLOW BLW RECEIVED IN FIXATIVE 1 SLIDE PREPARED 10/27/15 LW  Relevant Clinical Info1     <MALIGNANT NEOPLASM OF OVERLAPPING SITES OF BLADDER> >  PATHOLOGIST:1     REVIEWED BY S. SERDAR DEMIRCI, MD, (ELECTRONIC SIGNATURE ON FILE)  NUMBER OF SLIDES1     1 Container Submitted  CYTOTECHNOLOGIST:1     JLT, BS CT(ASCP)     1. Amended By: Raynelle Bring; Oct 28 2015 1:20 PM EST  Signatures Electronically signed by : Raynelle Bring, M.D.; Oct 28 2015  1:20PM EST

## 2015-11-18 ENCOUNTER — Ambulatory Visit (HOSPITAL_COMMUNITY)
Admission: RE | Admit: 2015-11-18 | Discharge: 2015-11-18 | Disposition: A | Discharge status: Home / Self Care | Payer: Medicare Other | Source: Ambulatory Visit | Attending: Urology | Admitting: Urology

## 2015-11-18 ENCOUNTER — Ambulatory Visit (HOSPITAL_COMMUNITY): Payer: Medicare Other | Admitting: Anesthesiology

## 2015-11-18 ENCOUNTER — Encounter (HOSPITAL_COMMUNITY)
Admission: RE | Disposition: A | Discharge status: Home / Self Care | Payer: Self-pay | Source: Ambulatory Visit | Attending: Urology

## 2015-11-18 ENCOUNTER — Encounter (HOSPITAL_COMMUNITY): Payer: Self-pay | Admitting: *Deleted

## 2015-11-18 DIAGNOSIS — Z79899 Other long term (current) drug therapy: Secondary | ICD-10-CM | POA: Insufficient documentation

## 2015-11-18 DIAGNOSIS — C674 Malignant neoplasm of posterior wall of bladder: Secondary | ICD-10-CM | POA: Diagnosis not present

## 2015-11-18 DIAGNOSIS — C678 Malignant neoplasm of overlapping sites of bladder: Secondary | ICD-10-CM | POA: Diagnosis not present

## 2015-11-18 DIAGNOSIS — C679 Malignant neoplasm of bladder, unspecified: Secondary | ICD-10-CM | POA: Diagnosis not present

## 2015-11-18 DIAGNOSIS — Z87442 Personal history of urinary calculi: Secondary | ICD-10-CM | POA: Diagnosis not present

## 2015-11-18 DIAGNOSIS — I10 Essential (primary) hypertension: Secondary | ICD-10-CM | POA: Diagnosis not present

## 2015-11-18 DIAGNOSIS — Z7984 Long term (current) use of oral hypoglycemic drugs: Secondary | ICD-10-CM | POA: Insufficient documentation

## 2015-11-18 DIAGNOSIS — C670 Malignant neoplasm of trigone of bladder: Secondary | ICD-10-CM | POA: Insufficient documentation

## 2015-11-18 DIAGNOSIS — E119 Type 2 diabetes mellitus without complications: Secondary | ICD-10-CM | POA: Insufficient documentation

## 2015-11-18 DIAGNOSIS — Z08 Encounter for follow-up examination after completed treatment for malignant neoplasm: Secondary | ICD-10-CM | POA: Diagnosis present

## 2015-11-18 DIAGNOSIS — F0390 Unspecified dementia without behavioral disturbance: Secondary | ICD-10-CM | POA: Insufficient documentation

## 2015-11-18 DIAGNOSIS — N400 Enlarged prostate without lower urinary tract symptoms: Secondary | ICD-10-CM | POA: Diagnosis not present

## 2015-11-18 HISTORY — PX: TRANSURETHRAL RESECTION OF BLADDER TUMOR: SHX2575

## 2015-11-18 HISTORY — PX: CYSTOSCOPY: SHX5120

## 2015-11-18 LAB — GLUCOSE, CAPILLARY
Glucose-Capillary: 234 mg/dL — ABNORMAL HIGH (ref 65–99)
Glucose-Capillary: 179 mg/dL — ABNORMAL HIGH (ref 65–99)

## 2015-11-18 SURGERY — CYSTOSCOPY
Anesthesia: General

## 2015-11-18 MED ORDER — SODIUM CHLORIDE 0.9 % IR SOLN
Status: DC | PRN
  Administered 2015-11-18: 6000 mL via INTRAVESICAL

## 2015-11-18 MED ORDER — FENTANYL CITRATE (PF) 100 MCG/2ML IJ SOLN: 25.0000 ug | INTRAMUSCULAR | Status: DC | PRN

## 2015-11-18 MED ORDER — CEFAZOLIN SODIUM-DEXTROSE 2-4 GM/100ML-% IV SOLN
INTRAVENOUS | Status: AC
  Filled 2015-11-18: qty 100

## 2015-11-18 MED ORDER — PROPOFOL 10 MG/ML IV BOLUS
INTRAVENOUS | Status: DC | PRN
  Administered 2015-11-18: 200 mg via INTRAVENOUS

## 2015-11-18 MED ORDER — PROMETHAZINE HCL 25 MG/ML IJ SOLN: 6.2500 mg | INTRAMUSCULAR | Status: DC | PRN

## 2015-11-18 MED ORDER — LIDOCAINE HCL (CARDIAC) 20 MG/ML IV SOLN
INTRAVENOUS | Status: DC | PRN
  Administered 2015-11-18: 80 mg via INTRAVENOUS

## 2015-11-18 MED ORDER — FENTANYL CITRATE (PF) 100 MCG/2ML IJ SOLN
INTRAMUSCULAR | Status: DC | PRN
  Administered 2015-11-18 (×2): 50 ug via INTRAVENOUS

## 2015-11-18 MED ORDER — FENTANYL CITRATE (PF) 100 MCG/2ML IJ SOLN
INTRAMUSCULAR | Status: AC
  Filled 2015-11-18: qty 2

## 2015-11-18 MED ORDER — CEFAZOLIN SODIUM-DEXTROSE 2-4 GM/100ML-% IV SOLN
2.0000 g | INTRAVENOUS | Status: AC
  Administered 2015-11-18: 2 g via INTRAVENOUS
  Filled 2015-11-18: qty 100

## 2015-11-18 MED ORDER — HYDROCODONE-ACETAMINOPHEN 5-325 MG PO TABS: 1.0000 | ORAL_TABLET | Freq: Four times a day (QID) | ORAL | Status: DC | PRN

## 2015-11-18 MED ORDER — ONDANSETRON HCL 4 MG/2ML IJ SOLN
INTRAMUSCULAR | Status: DC | PRN
  Administered 2015-11-18: 4 mg via INTRAVENOUS

## 2015-11-18 MED ORDER — ONDANSETRON HCL 4 MG/2ML IJ SOLN
INTRAMUSCULAR | Status: AC
  Filled 2015-11-18: qty 2

## 2015-11-18 MED ORDER — PHENAZOPYRIDINE HCL 100 MG PO TABS: 100.0000 mg | ORAL_TABLET | Freq: Three times a day (TID) | ORAL | Status: DC | PRN

## 2015-11-18 MED ORDER — LACTATED RINGERS IV SOLN
INTRAVENOUS | Status: DC | PRN
  Administered 2015-11-18: 15:00:00 via INTRAVENOUS

## 2015-11-18 SURGICAL SUPPLY — 18 items
BAG URINE DRAINAGE (UROLOGICAL SUPPLIES) ×3 IMPLANT
BAG URO CATCHER STRL LF (MISCELLANEOUS) ×3 IMPLANT
CATH INTERMIT  6FR 70CM (CATHETERS) IMPLANT
CLOTH BEACON ORANGE TIMEOUT ST (SAFETY) ×3 IMPLANT
ELECT REM PT RETURN 9FT ADLT (ELECTROSURGICAL) ×3
ELECTRODE REM PT RTRN 9FT ADLT (ELECTROSURGICAL) ×1 IMPLANT
EVACUATOR MICROVAS BLADDER (UROLOGICAL SUPPLIES) IMPLANT
GLOVE BIOGEL M STRL SZ7.5 (GLOVE) ×9 IMPLANT
GOWN STRL REUS W/TWL LRG LVL3 (GOWN DISPOSABLE) ×6 IMPLANT
GUIDEWIRE STR DUAL SENSOR (WIRE) IMPLANT
KIT ASPIRATION TUBING (SET/KITS/TRAYS/PACK) IMPLANT
LOOP CUT BIPOLAR 24F LRG (ELECTROSURGICAL) ×3 IMPLANT
MANIFOLD NEPTUNE II (INSTRUMENTS) ×3 IMPLANT
PACK CYSTO (CUSTOM PROCEDURE TRAY) ×3 IMPLANT
SYRINGE IRR TOOMEY STRL 70CC (SYRINGE) ×3 IMPLANT
TUBING CONNECTING 10 (TUBING) ×2 IMPLANT
TUBING CONNECTING 10' (TUBING) ×1
TUBING INSUFFLATION 10FT LAP (TUBING) IMPLANT

## 2015-11-18 NOTE — Discharge Instructions (Addendum)
1. You may see some blood in the urine and may have some burning with urination for 48-72 hours. You also may notice that you have to urinate more frequently or urgently after your procedure which is normal.  2. You should call should you develop an inability urinate, fever > 101, persistent nausea and vomiting that prevents you from eating or drinking to stay hydrated.   General Anesthesia, Adult, Care After Refer to this sheet in the next few weeks. These instructions provide you with information on caring for yourself after your procedure. Your health care provider may also give you more specific instructions. Your treatment has been planned according to current medical practices, but problems sometimes occur. Call your health care provider if you have any problems or questions after your procedure. WHAT TO EXPECT AFTER THE PROCEDURE After the procedure, it is typical to experience:  Sleepiness.  Nausea and vomiting. HOME CARE INSTRUCTIONS  For the first 24 hours after general anesthesia:  Have a responsible person with you.  Do not drive a car. If you are alone, do not take public transportation.  Do not drink alcohol.  Do not take medicine that has not been prescribed by your health care provider.  Do not sign important papers or make important decisions.  You may resume a normal diet and activities as directed by your health care provider.  Change bandages (dressings) as directed.  If you have questions or problems that seem related to general anesthesia, call the hospital and ask for the anesthetist or anesthesiologist on call. SEEK MEDICAL CARE IF:  You have nausea and vomiting that continue the day after anesthesia.  You develop a rash. SEEK IMMEDIATE MEDICAL CARE IF:   You have difficulty breathing.  You have chest pain.  You have any allergic problems.   This information is not intended to replace advice given to you by your health care provider. Make sure you  discuss any questions you have with your health care provider.   Document Released: 09/07/2000 Document Revised: 06/22/2014 Document Reviewed: 09/30/2011 Elsevier Interactive Patient Education [employer].

## 2015-11-18 NOTE — Transfer of Care (Signed)
Immediate Anesthesia Transfer of Care Note  Patient: [name] [name]  Procedure(s) Performed: Procedure(s): CYSTOSCOPY (N/A) TRANSURETHRAL RESECTION OF BLADDER TUMOR (TURBT) (N/A)  Patient Location: PACU  Anesthesia Type:General  Level of Consciousness:  sedated, patient cooperative and responds to stimulation  Airway & Oxygen Therapy:Patient Spontanous Breathing and Patient connected to face mask oxgen  Post-op Assessment:  Report given to PACU RN and Post -op Vital signs reviewed and stable  Post vital signs:  Reviewed and stable  Last Vitals:  Filed Vitals:   11/18/15 1324 11/18/15 1545  BP:  133/112  Pulse:  80  Temp: 36.7 C   Resp:  14    Complications: No apparent anesthesia complications

## 2015-11-18 NOTE — Interval H&P Note (Signed)
History and Physical Interval Note:  11/18/2015 2:44 PM  [name] [name]  has presented today for surgery, with the diagnosis of BLADDER CANCER  The various methods of treatment have been discussed with the patient and family. After consideration of risks, benefits and other options for treatment, the patient has consented to  Procedure(s): CYSTOSCOPY (N/A) TRANSURETHRAL RESECTION OF BLADDER TUMOR (TURBT) (N/A) as a surgical intervention .  The patient's history has been reviewed, patient examined, no change in status, stable for surgery.  I have reviewed the patient's chart and labs.  Questions were answered to the patient's satisfaction.     Rodrigues Urbanek,LES

## 2015-11-18 NOTE — Op Note (Signed)
Preoperative diagnosis: 1. Bladder tumor (2.2 cm)  Postoperative diagnosis:  1. Bladder tumors (2.2 cm, 1.0 cm)  Procedure:  1. Cystoscopy 2. Transurethral resection of bladder tumor (2.2 cm and 1.0 cm)  Surgeon: Roxy Horseman, Brooke Bonito. M.D.  Anesthesia: General  Complications: None  Intraoperative findings:  1. Bladder tumor: The previously noted area of tumor at the right hemitrigone was again noted.  The tumor appeared papillary and high grade and was located just medial and posterior to the right ureteral orifice.  There was a second area of tumor located just left of midline posteriorly that measured 1.1 cm and appeared papillary and high grade.  EBL: Minimal  Specimens: 1. Right hemitrigone bladder tumor 2. Left posterior bladder tumor  Disposition of specimens: Pathology  Indication: [name] [name] is a patient who was found to have a bladder tumor. After reviewing the management options for treatment, he elected to proceed with the above surgical procedure(s). We have discussed the potential benefits and risks of the procedure, side effects of the proposed treatment, the likelihood of the patient achieving the goals of the procedure, and any potential problems that might occur during the procedure or recuperation. Informed consent has been obtained.  Description of procedure:  The patient was taken to the operating room and general anesthesia was induced.  The patient was placed in the dorsal lithotomy position, prepped and draped in the usual sterile fashion, and preoperative antibiotics were administered. A preoperative time-out was performed.   Cystourethroscopy was performed.  The patient's urethra was examined and demonstrated bilobar prostatic hypertrophy.   The bladder was then systematically examined in its entirety. Tumors were noted at the above mentioned areas.   The bladder was then re-examined after the resectoscope was placed.  There were 2 separate tumor  locations. The largest tumor location measured 2.2 cm and was consistent with high-grade, papillary bladder cancer.  This was located just medial and posteriorly to the right ureteral orifice.   This tumor location was completely resected utilizing loop cautery resection.  All visible tumor was removed including muscularis propria.  Cautery was used to provide hemostasis.  A second tumor location was then identified just to the left of midline posteriorly.  This tumor measured 1.0 cm and was completely resected in a similar fashion.   Hemostasis was then achieved with the loop cautery and the bladder was emptied and reinspected with no further bleeding noted at the end of the procedure.    The bladder was then emptied and the procedure ended.  The patient appeared to tolerate the procedure well and without complications.  The patient was able to be awakened and transferred to the recovery unit in satisfactory condition.    Pryor Curia MD

## 2015-11-18 NOTE — Anesthesia Preprocedure Evaluation (Signed)
Anesthesia Evaluation  Patient identified by MRN, date of birth, ID band Patient awake    Reviewed: Allergy & Precautions, NPO status , Patient's Chart, lab work & pertinent test results  Airway Mallampati: II  TM Distance: >3 FB Neck ROM: Full    Dental no notable dental hx.    Pulmonary neg pulmonary ROS,    Pulmonary exam normal breath sounds clear to auscultation       Cardiovascular hypertension, Pt. on medications Normal cardiovascular exam Rhythm:Regular Rate:Normal     Neuro/Psych dementia negative psych ROS   GI/Hepatic negative GI ROS, Neg liver ROS,   Endo/Other  diabetes  Renal/GU negative Renal ROS  negative genitourinary   Musculoskeletal negative musculoskeletal ROS (+)   Abdominal   Peds negative pediatric ROS (+)  Hematology negative hematology ROS (+)   Anesthesia Other Findings   Reproductive/Obstetrics negative OB ROS                             Anesthesia Physical Anesthesia Plan  ASA: III  Anesthesia Plan: General   Post-op Pain Management:    Induction: Intravenous  Airway Management Planned: LMA  Additional Equipment:   Intra-op Plan:   Post-operative Plan: Extubation in OR  Informed Consent: I have reviewed the patients History and Physical, chart, labs and discussed the procedure including the risks, benefits and alternatives for the proposed anesthesia with the patient or authorized representative who has indicated his/her understanding and acceptance.   Dental advisory given  Plan Discussed with: CRNA and Surgeon  Anesthesia Plan Comments:         Anesthesia Quick Evaluation

## 2015-11-20 NOTE — Anesthesia Postprocedure Evaluation (Signed)
Anesthesia Post Note  Patient: CHRISTINO BAUMGART  Procedure(s) Performed: Procedure(s) (LRB): CYSTOSCOPY (N/A) TRANSURETHRAL RESECTION OF BLADDER TUMOR (TURBT) (N/A)  Patient location during evaluation: PACU Anesthesia Type: General Level of consciousness: awake and alert Pain management: pain level controlled Vital Signs Assessment: post-procedure vital signs reviewed and stable Respiratory status: spontaneous breathing, nonlabored ventilation, respiratory function stable and patient connected to nasal cannula oxygen Cardiovascular status: blood pressure returned to baseline and stable Postop Assessment: no signs of nausea or vomiting Anesthetic complications: no    Last Vitals:  Filed Vitals:   11/18/15 1600 11/18/15 1610  BP: 140/71 157/74  Pulse: 83   Temp:  36.6 C  Resp: 12 16    Last Pain:  Filed Vitals:   11/19/15 1217  PainSc: 0-No pain                 Tikita Mabee S

## 2015-12-03 DIAGNOSIS — C678 Malignant neoplasm of overlapping sites of bladder: Secondary | ICD-10-CM | POA: Diagnosis not present

## 2015-12-18 DIAGNOSIS — K8020 Calculus of gallbladder without cholecystitis without obstruction: Secondary | ICD-10-CM | POA: Diagnosis not present

## 2015-12-18 DIAGNOSIS — K4020 Bilateral inguinal hernia, without obstruction or gangrene, not specified as recurrent: Secondary | ICD-10-CM | POA: Diagnosis not present

## 2015-12-18 DIAGNOSIS — C679 Malignant neoplasm of bladder, unspecified: Secondary | ICD-10-CM | POA: Diagnosis not present

## 2015-12-18 DIAGNOSIS — K828 Other specified diseases of gallbladder: Secondary | ICD-10-CM | POA: Diagnosis not present

## 2015-12-23 DIAGNOSIS — Z6829 Body mass index (BMI) 29.0-29.9, adult: Secondary | ICD-10-CM | POA: Diagnosis not present

## 2015-12-23 DIAGNOSIS — C679 Malignant neoplasm of bladder, unspecified: Secondary | ICD-10-CM | POA: Diagnosis not present

## 2015-12-30 DIAGNOSIS — N390 Urinary tract infection, site not specified: Secondary | ICD-10-CM | POA: Diagnosis not present

## 2015-12-30 DIAGNOSIS — E789 Disorder of lipoprotein metabolism, unspecified: Secondary | ICD-10-CM | POA: Diagnosis not present

## 2015-12-30 DIAGNOSIS — Z125 Encounter for screening for malignant neoplasm of prostate: Secondary | ICD-10-CM | POA: Diagnosis not present

## 2015-12-30 DIAGNOSIS — E118 Type 2 diabetes mellitus with unspecified complications: Secondary | ICD-10-CM | POA: Diagnosis not present

## 2015-12-30 DIAGNOSIS — I10 Essential (primary) hypertension: Secondary | ICD-10-CM | POA: Diagnosis not present

## 2016-01-06 DIAGNOSIS — E118 Type 2 diabetes mellitus with unspecified complications: Secondary | ICD-10-CM | POA: Diagnosis not present

## 2016-01-06 DIAGNOSIS — I10 Essential (primary) hypertension: Secondary | ICD-10-CM | POA: Diagnosis not present

## 2016-01-06 DIAGNOSIS — C679 Malignant neoplasm of bladder, unspecified: Secondary | ICD-10-CM | POA: Diagnosis not present

## 2016-02-20 ENCOUNTER — Other Ambulatory Visit: Payer: Self-pay | Admitting: Urology

## 2016-02-20 DIAGNOSIS — Z23 Encounter for immunization: Secondary | ICD-10-CM | POA: Diagnosis not present

## 2016-02-20 NOTE — Progress Notes (Signed)
Please sign orders in EPIC as patient is being scheduled for a pre-op appointment at [facility]! Thank you!

## 2016-02-25 ENCOUNTER — Encounter (HOSPITAL_COMMUNITY): Payer: Self-pay

## 2016-02-25 NOTE — Patient Instructions (Addendum)
LEALAND RIZK  02/25/2016   Your procedure is scheduled on: 03-02-16  Report to [facility]  Entrance take [facility]  elevators to 3rd floor to  [place] at 1000 AM.  Call this number if you have problems the morning of surgery [phone]   Remember: ONLY 1 PERSON MAY GO WITH YOU TO SHORT STAY TO GET  READY MORNING OF [place].  Do not eat food or drink liquids :After Midnight.     Take these medicines the morning of surgery with A SIP OF WATER: Memantine. Metoprolol. DO NOT TAKE ANY DIABETIC MEDICATIONS DAY OF YOUR SURGERY                               You may not have any metal on your body including hair pins and              piercings  Do not wear jewelry, make-up, lotions, powders or perfumes, deodorant             Do not wear nail polish.  Do not shave  48 hours prior to surgery.              Men may shave face and neck.   Do not bring valuables to the hospital. [place].  Contacts, dentures or bridgework may not be worn into surgery.  Leave suitcase in the car. After surgery it may be brought to your room.     Patients discharged the day of surgery will not be allowed to drive home.  Name and phone number of your driver: spouse Almyra Free [phone] cell  Special Instructions: N/A              Please read over the following fact sheets you were given: _____________________________________________________________________             [facility] - Preparing for Surgery Before surgery, you can play an important role.  Because skin is not sterile, your skin needs to be as free of germs as possible.  You can reduce the number of germs on your skin by washing with CHG (chlorahexidine gluconate) soap before surgery.  CHG is an antiseptic cleaner which kills germs and bonds with the skin to continue killing germs even after washing. Please DO NOT use if you have an allergy to CHG or  antibacterial soaps.  If your skin becomes reddened/irritated stop using the CHG and inform your nurse when you arrive at Short Stay. Do not shave (including legs and underarms) for at least 48 hours prior to the first CHG shower.  You may shave your face/neck. Please follow these instructions carefully:  1.  Shower with CHG Soap the night before surgery and the  morning of Surgery.  2.  If you choose to wash your hair, wash your hair first as usual with your  normal  shampoo.  3.  After you shampoo, rinse your hair and body thoroughly to remove the  shampoo.                           4.  Use CHG as you would any other liquid soap.  You can apply chg directly  to the  skin and wash                       Gently with a scrungie or clean washcloth.  5.  Apply the CHG Soap to your body ONLY FROM THE NECK DOWN.   Do not use on face/ open                           Wound or open sores. Avoid contact with eyes, ears mouth and genitals (private parts).                       Wash face,  Genitals (private parts) with your normal soap.             6.  Wash thoroughly, paying special attention to the area where your surgery  will be performed.  7.  Thoroughly rinse your body with warm water from the neck down.  8.  DO NOT shower/wash with your normal soap after using and rinsing off  the CHG Soap.                9.  Pat yourself dry with a clean towel.            10.  Wear clean pajamas.            11.  Place clean sheets on your bed the night of your first shower and do not  sleep with pets. Day of Surgery : Do not apply any lotions/deodorants the morning of surgery.  Please wear clean clothes to the hospital/surgery center.  FAILURE TO FOLLOW THESE INSTRUCTIONS MAY RESULT IN THE CANCELLATION OF YOUR SURGERY PATIENT SIGNATURE_________________________________  NURSE SIGNATURE__________________________________  ________________________________________________________________________

## 2016-02-27 ENCOUNTER — Encounter (HOSPITAL_COMMUNITY)
Admission: RE | Admit: 2016-02-27 | Discharge: 2016-02-27 | Disposition: A | Discharge status: Home / Self Care | Payer: Medicare Other | Source: Ambulatory Visit | Attending: Urology | Admitting: Urology

## 2016-02-27 ENCOUNTER — Encounter (HOSPITAL_COMMUNITY): Payer: Self-pay

## 2016-02-27 DIAGNOSIS — Z01818 Encounter for other preprocedural examination: Secondary | ICD-10-CM | POA: Insufficient documentation

## 2016-02-27 DIAGNOSIS — E119 Type 2 diabetes mellitus without complications: Secondary | ICD-10-CM | POA: Insufficient documentation

## 2016-02-27 LAB — BASIC METABOLIC PANEL
Anion gap: 9 (ref 5–15)
BUN: 19 mg/dL (ref 6–20)
CALCIUM: 9.5 mg/dL (ref 8.9–10.3)
CO2: 28 mmol/L (ref 22–32)
CREATININE: 0.86 mg/dL (ref 0.61–1.24)
Chloride: 105 mmol/L (ref 101–111)
GFR calc non Af Amer: >60 mL/min (ref >60)
GLUCOSE: 181 mg/dL — AB (ref 65–99)
Potassium: 4.2 mmol/L (ref 3.5–5.1)
Sodium: 142 mmol/L (ref 135–145)

## 2016-02-27 LAB — CBC
HCT: 40.4 % (ref 39.0–52.0)
Hemoglobin: 13.6 g/dL (ref 13.0–17.0)
MCH: 28.2 pg (ref 26.0–34.0)
MCHC: 33.7 g/dL (ref 30.0–36.0)
MCV: 83.8 fL (ref 78.0–100.0)
PLATELETS: 241 10*3/uL (ref 150–400)
RBC: 4.82 MIL/uL (ref 4.22–5.81)
RDW: 12.8 % (ref 11.5–15.5)
WBC: 10.6 10*3/uL — ABNORMAL HIGH (ref 4.0–10.5)

## 2016-02-27 NOTE — Pre-Procedure Instructions (Signed)
EKG 11'16 Epic.

## 2016-02-28 LAB — HEMOGLOBIN A1C
Hgb A1c MFr Bld: 7.7 % — ABNORMAL HIGH (ref 4.8–5.6)
Mean Plasma Glucose: 174 mg/dL

## 2016-02-28 NOTE — H&P (Signed)
Office Visit Report     12/03/2015   --------------------------------------------------------------------------------   [name] [name]  [id number]  PRIMARY CARE:  Arleta Creek, MD  DOB: 1940-10-11, 75 year old Male  REFERRING:  W Gareth Eagle, MD  SSN: [id number]  PROVIDER:  Raynelle Bring, M.D.    LOCATION:  [facility] 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  He is not felt to be a good candidate for radical cystectomy based on his progressive dementia.  He was therefore referred to [facility] and was seen by Dr. Norman Herrlich.  He did undergo a CT scan suggested a possible residual bladder mass.  After discussing the situation further with Dr. Ricky Ala, it was decided to take him back for further cystoscopic evaluation and possible TUR and then to proceed with postoperative intravesical chemotherapy which will be performed through [facility].       ALLERGIES: Nitrofurantoin Monohyd Macro CAPS Sulfa Drugs Tetracyclines    MEDICATIONS: Avodart 0.5 MG Oral Capsule 0 Oral  Fish Oil CAPS Oral  Fluorouracil 5 % External Cream External  Fluticasone Propionate 0.05 % External Cream External  HydroCHLOROthiazide 25 MG Oral Tablet Oral  MetFORMIN HCl - 500 MG Oral Tablet Oral  Namenda XR 28 MG Oral Capsule Extended Release 24 Hour Oral  Onglyza 5 MG Oral Tablet Oral  Perindopril Erbumine 4 MG Oral Tablet Oral  Potassium Citrate ER 10 MEQ (1080 MG) Oral Tablet Extended Release 0 Oral  Vitamin B Complex TABS Oral  Vitamin B12 TABS Oral  Vitamin C TABS Oral  Vitamin E TABS Oral     GU PSH: Bladder Instill AntiCA Agent - 07/26/2014 Cysto Uretero Lithotripsy - 2010 Cysto Uretero Remove Stone - 2008 Cystoscopy Fulguration - 2010 Cystoscopy Insert Stent - 2015, 2010, 2010,  2008 Cystoscopy TURBT <2 cm - 2015, 2013 Cystoscopy TURBT 2-5 cm - 11/18/2015, 05/21/2015, 07/26/2014 Cystoscopy Ureteroscopy - 2015 Percut Stone Removal >2cm - 2015      PSH Notes: Cystoscopy With Fulguration Medium Lesion (2-5cm), Cystoscopy With Fulguration Medium Lesion (2-5cm), Bladder Injection Of Cancer Treatment, Percutaneous Lithotomy For Stone Over 2cm., Cystoscopy With Insertion Of Ureteral Stent Left, Cystoscopy With Ureteroscopy Left, Cystoscopy With Fulguration Small Lesion (5-73mm), Cystoscopy With Fulguration Small Lesion (5-71mm), Cystoscopy With Pyeloscopy With Lithotripsy, Cystoscopy With Insertion Of Ureteral Stent Right, Cystoscopy With Fulguration, Cystoscopy With Insertion Of Ureteral Stent Right, Cystoscopy With Ureteroscopy With Removal Of Calculus, Cystoscopy With Insertion Of Ureteral Stent Left   NON-GU PSH: None   GU PMH: Bladder Cancer, overlapping sites, Malignant neoplasm of overlapping sites of bladder - 10/10/2015 Urgency of urination, Urinary urgency - 05/31/2015 BPH w/LUTS, Benign prostatic hyperplasia (BPH) with straining on urination - 11/26/2014 Kidney Stone, Nephrolithiasis - 11/26/2014      PMH Notes:   [name] [name] is a 75 year old previously followed by Dr. Tresa Endo. 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 He was noted to have a recurrence in 2013 and underwent TURBT in July 2013. He was also incidentally noted to have a stricture of the distal left ureter on RPG studies at that time and a brush biopsy was performed. He was found to have high grade Ta urothelial  carcinoma in February 2016.    Oct 2007: TURBT, Low grade Ta  Jul 2013: TURBT (right bladder neck 1.5 cm tumor, low grade Ta),brush biopsy of distal left ureteral stricture (no malignancy)  Mar 2015: TURBT - Low grade Ta  Feb 2016: TURBT - High grade Ta,  [facility]  Mar-Apr 2016: Induction BCG  Jul 2016: Maintenance BCG  Oct 2016: Multiple recurrent tumors noted on surveillance cystoscopy  Dec 2016: TUR - Multiple tumors over right posterior bladder - High grade, Ta  Dec 2016 - Feb 2017: 6 week induction BCG  May 2017: TURBT - High grade, Ta urothelial carcinoma (two tumors)   2) Nephrolithiasis: He has a long standing history of calcium oxalate stones. He was incidentally noted to have a large partial staghorn calculus during retrograde pyelography in February 2015.  Apr 2015: L PCNL   3) Prostate cancer screening: We discontinued screening in 2014 due to his low risk for prostate cancer at that time and progressive dementia.  Last PSA: 0.35 (June 2014)   4) BPH/LUTS: He is treated with Avodart.     NON-GU PMH: Personal history of other diseases of the circulatory system, History of hypertension - 2014 Personal history of other endocrine, nutritional and metabolic disease, History of diabetes mellitus - 2014 Unspecified dementia without behavioral disturbance    FAMILY HISTORY: No pertinent family history - Other Prostate Cancer - Runs In Family   SOCIAL HISTORY: Marital Status: Married Patient has never smoked.  Has never drank.  Drinks 2 caffeinated drinks per day. Has not had a blood transfusion.     Notes: Never A Smoker   REVIEW OF SYSTEMS:    Gastrointestinal (Lower):   Patient denies diarrhea and constipation.  Gastrointestinal (Upper):   Patient denies nausea and vomiting.  Constitutional:   Patient denies fever, night sweats, weight loss, and fatigue.  Skin:   Patient denies skin rash/ lesion and itching.  Eyes:   Patient denies blurred vision and double vision.  Ears/ Nose/ Throat:   Patient denies sore throat and sinus problems.  Hematologic/Lymphatic:   Patient denies swollen glands and easy bruising.  Cardiovascular:   Patient denies leg swelling and chest pains.  Respiratory:   Patient denies cough and shortness of  breath.  Endocrine:   Patient denies excessive thirst.  Musculoskeletal:   Patient denies back pain and joint pain.  Neurological:   Patient denies headaches and dizziness.  Psychologic:   Patient denies depression and anxiety.   VITAL SIGNS: None   MULTI-SYSTEM PHYSICAL EXAMINATION:    Constitutional: Well-nourished. No physical deformities. Normally developed. Good grooming.     PAST DATA REVIEWED:  Source Of History:  Patient  Records Review:   Pathology Reports       ASSESSMENT:      ICD-10 Details  1 GU:   Bladder Cancer, overlapping sites - C67.8           Notes:   1. High-risk non-muscle invasive bladder cancer: He will undergo cystoscopy, EUA, and TUR of any remaining or residual tumor. I discussed the potential benefits and risks of the procedure, side effects of the proposed treatment, the likelihood of the patient achieving the goals of the procedure, and any potential problems that might occur during the procedure or recuperation.

## 2016-03-02 ENCOUNTER — Encounter (HOSPITAL_COMMUNITY): Payer: Self-pay | Admitting: *Deleted

## 2016-03-02 ENCOUNTER — Ambulatory Visit (HOSPITAL_COMMUNITY): Payer: Medicare Other | Admitting: Anesthesiology

## 2016-03-02 ENCOUNTER — Encounter (HOSPITAL_COMMUNITY)
Admission: RE | Disposition: A | Discharge status: Home / Self Care | Payer: Self-pay | Source: Ambulatory Visit | Attending: Urology

## 2016-03-02 ENCOUNTER — Ambulatory Visit (HOSPITAL_COMMUNITY)
Admission: RE | Admit: 2016-03-02 | Discharge: 2016-03-02 | Disposition: A | Discharge status: Home / Self Care | Payer: Medicare Other | Source: Ambulatory Visit | Attending: Urology | Admitting: Urology

## 2016-03-02 DIAGNOSIS — Z87442 Personal history of urinary calculi: Secondary | ICD-10-CM | POA: Diagnosis not present

## 2016-03-02 DIAGNOSIS — N3080 Other cystitis without hematuria: Secondary | ICD-10-CM | POA: Insufficient documentation

## 2016-03-02 DIAGNOSIS — C679 Malignant neoplasm of bladder, unspecified: Secondary | ICD-10-CM | POA: Insufficient documentation

## 2016-03-02 DIAGNOSIS — F0390 Unspecified dementia without behavioral disturbance: Secondary | ICD-10-CM | POA: Insufficient documentation

## 2016-03-02 DIAGNOSIS — Z79899 Other long term (current) drug therapy: Secondary | ICD-10-CM | POA: Insufficient documentation

## 2016-03-02 DIAGNOSIS — I10 Essential (primary) hypertension: Secondary | ICD-10-CM | POA: Insufficient documentation

## 2016-03-02 DIAGNOSIS — C678 Malignant neoplasm of overlapping sites of bladder: Secondary | ICD-10-CM | POA: Diagnosis not present

## 2016-03-02 DIAGNOSIS — Z7984 Long term (current) use of oral hypoglycemic drugs: Secondary | ICD-10-CM | POA: Diagnosis not present

## 2016-03-02 DIAGNOSIS — E119 Type 2 diabetes mellitus without complications: Secondary | ICD-10-CM | POA: Diagnosis not present

## 2016-03-02 DIAGNOSIS — R9341 Abnormal radiologic findings on diagnostic imaging of renal pelvis, ureter, or bladder: Secondary | ICD-10-CM | POA: Diagnosis present

## 2016-03-02 HISTORY — PX: CYSTOSCOPY W/ RETROGRADES: SHX1426

## 2016-03-02 HISTORY — PX: TRANSURETHRAL RESECTION OF BLADDER TUMOR: SHX2575

## 2016-03-02 LAB — GLUCOSE, CAPILLARY
GLUCOSE-CAPILLARY: 237 mg/dL — AB (ref 65–99)
Glucose-Capillary: 296 mg/dL — ABNORMAL HIGH (ref 65–99)

## 2016-03-02 SURGERY — TURBT (TRANSURETHRAL RESECTION OF BLADDER TUMOR)
Anesthesia: General

## 2016-03-02 MED ORDER — PHENAZOPYRIDINE HCL 100 MG PO TABS: 100.0000 mg | ORAL_TABLET | Freq: Three times a day (TID) | ORAL | 0 refills | Status: DC | PRN

## 2016-03-02 MED ORDER — FENTANYL CITRATE (PF) 100 MCG/2ML IJ SOLN
INTRAMUSCULAR | Status: DC | PRN
  Administered 2016-03-02: 25 ug via INTRAVENOUS

## 2016-03-02 MED ORDER — LACTATED RINGERS IV SOLN
INTRAVENOUS | Status: DC
  Administered 2016-03-02: 1000 mL via INTRAVENOUS

## 2016-03-02 MED ORDER — CEFAZOLIN SODIUM-DEXTROSE 2-4 GM/100ML-% IV SOLN
2.0000 g | INTRAVENOUS | Status: AC
  Administered 2016-03-02: 2 g via INTRAVENOUS

## 2016-03-02 MED ORDER — ONDANSETRON HCL 4 MG/2ML IJ SOLN
INTRAMUSCULAR | Status: DC | PRN
  Administered 2016-03-02: 4 mg via INTRAVENOUS

## 2016-03-02 MED ORDER — PROPOFOL 10 MG/ML IV BOLUS
INTRAVENOUS | Status: DC | PRN
  Administered 2016-03-02: 200 mg via INTRAVENOUS

## 2016-03-02 MED ORDER — CEFAZOLIN SODIUM-DEXTROSE 2-4 GM/100ML-% IV SOLN
INTRAVENOUS | Status: AC
  Filled 2016-03-02: qty 100

## 2016-03-02 MED ORDER — SODIUM CHLORIDE 0.9 % IR SOLN
Status: DC | PRN
  Administered 2016-03-02: 6000 mL via INTRAVESICAL

## 2016-03-02 MED ORDER — PROPOFOL 10 MG/ML IV BOLUS
INTRAVENOUS | Status: AC
  Filled 2016-03-02: qty 20

## 2016-03-02 MED ORDER — FENTANYL CITRATE (PF) 100 MCG/2ML IJ SOLN
INTRAMUSCULAR | Status: AC
  Filled 2016-03-02: qty 2

## 2016-03-02 SURGICAL SUPPLY — 17 items
BAG URINE DRAINAGE (UROLOGICAL SUPPLIES) IMPLANT
BAG URO CATCHER STRL LF (MISCELLANEOUS) ×4 IMPLANT
CATH INTERMIT  6FR 70CM (CATHETERS) ×4 IMPLANT
CLOTH BEACON ORANGE TIMEOUT ST (SAFETY) ×4 IMPLANT
ELECT REM PT RETURN 9FT ADLT (ELECTROSURGICAL) ×4
ELECTRODE REM PT RTRN 9FT ADLT (ELECTROSURGICAL) ×2 IMPLANT
EVACUATOR MICROVAS BLADDER (UROLOGICAL SUPPLIES) IMPLANT
GLOVE BIOGEL M STRL SZ7.5 (GLOVE) ×4 IMPLANT
GOWN STRL REUS W/TWL LRG LVL3 (GOWN DISPOSABLE) ×8 IMPLANT
GUIDEWIRE STR DUAL SENSOR (WIRE) IMPLANT
LOOP CUT BIPOLAR 24F LRG (ELECTROSURGICAL) IMPLANT
MANIFOLD NEPTUNE II (INSTRUMENTS) ×4 IMPLANT
PACK CYSTO (CUSTOM PROCEDURE TRAY) ×4 IMPLANT
SET ASPIRATION TUBING (TUBING) ×4 IMPLANT
SYRINGE IRR TOOMEY STRL 70CC (SYRINGE) IMPLANT
TUBING CONNECTING 10 (TUBING) ×3 IMPLANT
TUBING CONNECTING 10' (TUBING) ×1

## 2016-03-02 NOTE — Anesthesia Procedure Notes (Signed)
Procedure Name: LMA Insertion Date/Time: 03/02/2016 11:15 AM Performed by: Anne Fu Pre-anesthesia Checklist: Patient identified, Emergency Drugs available, Suction available, Patient being monitored and Timeout performed Patient Re-evaluated:Patient Re-evaluated prior to inductionOxygen Delivery Method: Circle system utilized Preoxygenation: Pre-oxygenation with 100% oxygen Intubation Type: IV induction Ventilation: Mask ventilation without difficulty LMA: LMA inserted LMA Size: 4.0 Number of attempts: 1 Placement Confirmation: positive ETCO2 and breath sounds checked- equal and bilateral Tube secured with: Tape

## 2016-03-02 NOTE — Anesthesia Preprocedure Evaluation (Signed)
Anesthesia Evaluation  Patient identified by MRN, date of birth, ID band Patient awake    Reviewed: Allergy & Precautions, H&P , NPO status , Patient's Chart, lab work & pertinent test results  Airway Mallampati: II   Neck ROM: full    Dental   Pulmonary neg pulmonary ROS,    breath sounds clear to auscultation       Cardiovascular hypertension,  Rhythm:regular Rate:Normal     Neuro/Psych PSYCHIATRIC DISORDERS Dementia   GI/Hepatic   Endo/Other  diabetes, Type 2  Renal/GU      Musculoskeletal   Abdominal   Peds  Hematology   Anesthesia Other Findings   Reproductive/Obstetrics                             Anesthesia Physical Anesthesia Plan  ASA: III  Anesthesia Plan: General   Post-op Pain Management:    Induction: Intravenous  Airway Management Planned: LMA  Additional Equipment:   Intra-op Plan:   Post-operative Plan:   Informed Consent: I have reviewed the patients History and Physical, chart, labs and discussed the procedure including the risks, benefits and alternatives for the proposed anesthesia with the patient or authorized representative who has indicated his/her understanding and acceptance.     Plan Discussed with: CRNA, Anesthesiologist and Surgeon  Anesthesia Plan Comments:         Anesthesia Quick Evaluation

## 2016-03-02 NOTE — Transfer of Care (Signed)
Immediate Anesthesia Transfer of Care Note  Patient: [name] [name]  Procedure(s) Performed: Procedure(s): TRANSURETHRAL RESECTION OF BLADDER TUMOR (TURBT) (N/A) CYSTOSCOPY, EXAM UNDER ANESTHESIA (Bilateral)  Patient Location: PACU  Anesthesia Type:General  Level of Consciousness:  sedated, patient cooperative and responds to stimulation  Airway & Oxygen Therapy:Patient Spontanous Breathing and Patient connected to face mask oxgen  Post-op Assessment:  Report given to PACU RN and Post -op Vital signs reviewed and stable  Post vital signs:  Reviewed and stable  Last Vitals:  Vitals:   03/02/16 0949  BP: (!) 149/78  Pulse: 83  Resp: 16  Temp: [id number]    Complications: No apparent anesthesia complications

## 2016-03-02 NOTE — Discharge Instructions (Signed)
General Anesthesia, Adult, Care After Refer to this sheet in the next few weeks. These instructions provide you with information on caring for yourself after your procedure. Your health care provider may also give you more specific instructions. Your treatment has been planned according to current medical practices, but problems sometimes occur. Call your health care provider if you have any problems or questions after your procedure. WHAT TO EXPECT AFTER THE PROCEDURE After the procedure, it is typical to experience: Sleepiness. Nausea and vomiting. HOME CARE INSTRUCTIONS For the first 24 hours after general anesthesia: Have a responsible person with you. Do not drive a car. If you are alone, do not take public transportation. Do not drink alcohol. Do not take medicine that has not been prescribed by your health care provider. Do not sign important papers or make important decisions. You may resume a normal diet and activities as directed by your health care provider. Change bandages (dressings) as directed. If you have questions or problems that seem related to general anesthesia, call the hospital and ask for the anesthetist or anesthesiologist on call. SEEK MEDICAL CARE IF: You have nausea and vomiting that continue the day after anesthesia. You develop a rash. SEEK IMMEDIATE MEDICAL CARE IF:  You have difficulty breathing. You have chest pain. You have any allergic problems.   This information is not intended to replace advice given to you by your health care provider. Make sure you discuss any questions you have with your health care provider.   Document Released: 09/07/2000 Document Revised: 06/22/2014 Document Reviewed: 09/30/2011 Elsevier Interactive Patient Education 2016 Else[employer] Inc[employer]      Transurethral Resection, Bladder Tumor A cancerous growth (tumor) can develop on the inside wall of the bladder. The bladder is the organ that holds urine. One way to remove the tumor  is a procedure called a transurethral resection. The tumor is removed (resected) through the tube that carries urine from the bladder out of the body (urethra). No cuts (incisions) are made in the skin. Instead, the procedure is done through a thin telescope, called a resectoscope. Attached to it is a light and usually a tiny camera. The resectoscope is put into the urethra. In men, the urethra opens at the end of the penis. In women, it opens just above the vagina.  A transurethral resection is usually used to remove tumors that have not gotten too big or too deep. These are called Stage 0, Stage 1 or Stage 2 bladder cancers. LET YOUR CAREGIVER KNOW ABOUT:  On the day of the procedure, your caregivers will need to know the last time you had anything to eat or drink. This includes water, gum, and candy. In advance, make sure they know about:   Any allergies.  All medications you are taking, including:  Herbs, eyedrops, over-the-counter medications and creams.  Blood thinners (anticoagulants), aspirin or other drugs that could affect blood clotting.  Use of steroids (by mouth or as creams).  Previous problems with anesthetics, including local anesthetics.  Possibility of pregnancy, if this applies.  Any history of blood clots.  Any history of bleeding or other blood problems.  Previous surgery.  Smoking history.  Any recent symptoms of colds or infections.  Other health problems. RISKS AND COMPLICATIONS This is usually a safe procedure. Every procedure has risks, though. For a transurethral resection, they include:  Infection. Antibiotic medication would need to be taken.  Bleeding.  Light bleeding may last for several days after the procedure.  If bleeding continues  or is heavy, the bladder may need rinsing. Or, a new catheter might be put in for awhile.  Sometimes bed rest is needed.  Urination problems.  Pain and burning can occur when urinating. This usually goes away  in a few days.  Scarring from the procedure can block the flow of urine.  Bladder damage.  It can be punctured or torn during removal of the tumor. If this happens, a catheter might be needed for longer. Antibiotics would be taken while the bladder heals.  Urine can leak through the hole or tear into the abdomen. If this happens, surgery may be needed to repair the bladder. BEFORE THE PROCEDURE   A medical evaluation will be done. This may include:  A physical examination.  Urine test. This is to make sure you do not have a urinary tract infection.  Blood tests.  A test that checks the heart's rhythm (electrocardiogram).  Talking with an anesthesiologist. This is the person who will be in charge of the medication (anesthesia) to keep you from feeling pain during the transurethral resection. You might be asleep during the procedure (general anesthesia) or numb from the waist down, but awake during the procedure (spinal anesthesia). Ask your surgeon what to expect.  The person who is having a transurethral resection needs to give what is called informed consent. This requires signing a legal paper that gives permission for the procedure. To give informed consent:  You must understand how the procedure is done and why.  You must be told all the risks and benefits of the procedure.  You must sign the consent. Sometimes a legal guardian can do this.  Signing should be witnessed by a healthcare professional.  The day before the surgery, eat only a light dinner. Then, do not eat or drink anything for at least 8 hours before the surgery. Ask your caregiver if it is OK to take any needed medicines with a sip of water.  Arrive at least an hour before the surgery or whenever your surgeon recommends. This will give you time to check in and fill out any needed paperwork. PROCEDURE  The preparation:  You will change into a hospital gown.  A needle will be inserted in your arm. This is an  intravenous access tube (IV). Medication will be able to flow directly into your body through this needle.  Small monitors will be put on your body. They are used to check your heart, blood pressure, and oxygen level.  You might be given medication that will help you relax (sedative).  You will be given a general anesthetic or spinal anesthesia.  The procedure:  Once you are asleep or numb from the waist down, your legs will be placed in stirrups.  The resectoscope will be passed through the urethra into the bladder.  Fluid will be passed through the resectoscope. This will fill the bladder with water.  The surgeon will examine the bladder through the scope. If the scope has a camera, it can take pictures from inside the bladder. They can be projected onto a TV screen.  The surgeon will use various tools to remove the tumor in small pieces. Sometimes a laser (a beam of light energy) is used. Other tools may use electric current.  A tube (catheter) will often be placed so that urine can drain into a bag outside the body. This process helps stop bleeding. This tube keeps blood clots from blocking the urethra.  The procedure usually takes 30 to 45  minutes. AFTER THE PROCEDURE   You will stay in a recovery area until the anesthesia has worn off. Your blood pressure and pulse will be checked every so often. Then you will be taken to a hospital room.  You may continue to get fluids through the IV for awhile.  Some pain is normal. The catheter might be uncomfortable. Pain is usually not severe. If it is, ask for pain medicine.  Your urine may look bloody after a transurethral resection. This is normal.  If bleeding is heavy, a hospital caregiver may rinse out the bladder (irrigation) through the catheter.  Once the urine is clear, the catheter will be taken out.  You will need to stay in the hospital until you can urinate on your own.  Most people stay in the hospital for up to 4  days. PROGNOSIS   Transurethral resection is considered the best way to treat bladder tumors that are not too far along. For most people, the treatment is successful. Sometimes, though, more treatment is needed.  Bladder cancers can come back even after a successful procedure. Because of this, be sure to have a checkup with your caregiver every 3 to 6 months. If everything is OK for 3 years, you can reduce the checkups to once a year.   This information is not intended to replace advice given to you by your health care provider. Make sure you discuss any questions you have with your health care provider.   Document Released: 03/28/2009 Document Revised: 08/24/2011 Document Reviewed: 06/03/2009 Elsevier Interactive Patient Education [employer].

## 2016-03-02 NOTE — Op Note (Signed)
Preoperative diagnosis: Urothelial carcinoma of bladder  Postoperative diagnosis: Urothelial carcinoma of bladder  Procedure: 1.  Cystoscopy 2.  Pelvic exam under anesthesia 3.  Transurethral resection of bladder tumor (0.5 cm)  Surgeon: [name] [name], Brooke Bonito.  Anesthesia: General  Complications: None  EBL: Minimal  Specimens: Bladder tumor  Disposition of specimens: Pathology  Indication: [name] [name] is a 75 year old gentleman with a history of high risk non-muscle invasive bladder cancer.  He has been refractory to BCG and is being considered for intravesical chemotherapy through [place].  Since his last cystoscopy, he has been evaluated at [facility] by Dr. Carla Drape.  He did undergo a CT scan which demonstrated some questionable abnormality of the bladder raising concern for possible residual or recurrent disease.  He follows up today to undergo cystoscopy, pelvic exam under anesthesia, and transurethral resection of any residual/recurrent tumor.  The potential risks, complications, and expected recovery process of this procedure have been reviewed in detail with him and his wife.  Informed consent has been obtained.  Description of procedure:  The patient was taken to the operating room and a general anesthetic was administered.  He was given preoperative antibiotics, placed in the dorsal lithotomy position, and prepped and draped in the usual sterile fashion.  Next, a preoperative timeout was performed.  Cystourethroscopy was then performed with the 21 French rigid cystoscope sheath.  A 30 and 70 lens was used to visualize the bladder in its entirety.  There was noted to be an area measuring approximately 0.5 cm raising concern for possible recurrent tumor just posterior and lateral to the right ureteral orifice.  No other bladder tumors, stones, or other mucosal pathology was identified.  The cystoscope was then removed and replaced with the 26 French resectoscope  sheath.  Using loop bipolar cutting resection, the previously mentioned abnormality was resected in its entirety including underlying tissue.  No bladder perforation was identified.  The ureteral orifice appeared preserved.  The patient's bladder was then emptied and reinspected.  Hemostasis appeared excellent.  The urine was emptied from the bladder.  Pelvic exam under anesthesia was then performed.  The prostate measured and estimated 40 cc without nodularity or induration.  No 3-dimensional pelvic mass was noted on exam.  The patient tolerated the procedure well and without complications.  He is scheduled to begin intravesical chemotherapy under the care of Dr. Ricky Ala later this month.   cc: Dr. Carla Drape

## 2016-03-02 NOTE — Anesthesia Postprocedure Evaluation (Signed)
Anesthesia Post Note  Patient: [name] [name]  Procedure(s) Performed: Procedure(s) (LRB): TRANSURETHRAL RESECTION OF BLADDER TUMOR (TURBT) (N/A) CYSTOSCOPY, EXAM UNDER ANESTHESIA (Bilateral)  Patient location during evaluation: PACU Anesthesia Type: General Level of consciousness: awake and alert and patient cooperative Pain management: pain level controlled Vital Signs Assessment: post-procedure vital signs reviewed and stable Respiratory status: spontaneous breathing and respiratory function stable Cardiovascular status: stable Anesthetic complications: no    Last Vitals:  Vitals:   03/02/16 1233 03/02/16 1305  BP: (!) 152/77 (!) 145/79  Pulse:    Resp: 16 16  Temp: 36.4 C 36.4 C    Last Pain:  Vitals:   03/02/16 1305  TempSrc: Oral                 Abrham Maslowski S

## 2016-03-10 DIAGNOSIS — Z85828 Personal history of other malignant neoplasm of skin: Secondary | ICD-10-CM | POA: Diagnosis not present

## 2016-03-10 DIAGNOSIS — C672 Malignant neoplasm of lateral wall of bladder: Secondary | ICD-10-CM | POA: Insufficient documentation

## 2016-03-10 DIAGNOSIS — C44329 Squamous cell carcinoma of skin of other parts of face: Secondary | ICD-10-CM | POA: Diagnosis not present

## 2016-03-10 DIAGNOSIS — C44319 Basal cell carcinoma of skin of other parts of face: Secondary | ICD-10-CM | POA: Diagnosis not present

## 2016-03-10 DIAGNOSIS — C679 Malignant neoplasm of bladder, unspecified: Secondary | ICD-10-CM

## 2016-03-10 DIAGNOSIS — L570 Actinic keratosis: Secondary | ICD-10-CM | POA: Diagnosis not present

## 2016-03-11 DIAGNOSIS — C679 Malignant neoplasm of bladder, unspecified: Secondary | ICD-10-CM | POA: Diagnosis not present

## 2016-03-16 DIAGNOSIS — C679 Malignant neoplasm of bladder, unspecified: Secondary | ICD-10-CM | POA: Diagnosis not present

## 2016-03-23 DIAGNOSIS — C679 Malignant neoplasm of bladder, unspecified: Secondary | ICD-10-CM | POA: Diagnosis not present

## 2016-03-30 DIAGNOSIS — C679 Malignant neoplasm of bladder, unspecified: Secondary | ICD-10-CM | POA: Diagnosis not present

## 2016-04-06 DIAGNOSIS — C679 Malignant neoplasm of bladder, unspecified: Secondary | ICD-10-CM | POA: Diagnosis not present

## 2016-04-13 DIAGNOSIS — C679 Malignant neoplasm of bladder, unspecified: Secondary | ICD-10-CM | POA: Diagnosis not present

## 2016-04-20 DIAGNOSIS — Z85828 Personal history of other malignant neoplasm of skin: Secondary | ICD-10-CM | POA: Diagnosis not present

## 2016-04-20 DIAGNOSIS — L578 Other skin changes due to chronic exposure to nonionizing radiation: Secondary | ICD-10-CM | POA: Diagnosis not present

## 2016-05-04 DIAGNOSIS — E118 Type 2 diabetes mellitus with unspecified complications: Secondary | ICD-10-CM | POA: Diagnosis not present

## 2016-05-04 DIAGNOSIS — D72829 Elevated white blood cell count, unspecified: Secondary | ICD-10-CM | POA: Diagnosis not present

## 2016-05-04 DIAGNOSIS — I10 Essential (primary) hypertension: Secondary | ICD-10-CM | POA: Diagnosis not present

## 2016-05-12 DIAGNOSIS — C679 Malignant neoplasm of bladder, unspecified: Secondary | ICD-10-CM | POA: Diagnosis not present

## 2016-05-12 DIAGNOSIS — F0390 Unspecified dementia without behavioral disturbance: Secondary | ICD-10-CM | POA: Diagnosis not present

## 2016-05-12 DIAGNOSIS — E118 Type 2 diabetes mellitus with unspecified complications: Secondary | ICD-10-CM | POA: Diagnosis not present

## 2016-05-12 DIAGNOSIS — I10 Essential (primary) hypertension: Secondary | ICD-10-CM | POA: Diagnosis not present

## 2016-05-19 DIAGNOSIS — H53002 Unspecified amblyopia, left eye: Secondary | ICD-10-CM | POA: Diagnosis not present

## 2016-05-19 DIAGNOSIS — E119 Type 2 diabetes mellitus without complications: Secondary | ICD-10-CM | POA: Diagnosis not present

## 2016-05-19 DIAGNOSIS — H524 Presbyopia: Secondary | ICD-10-CM | POA: Diagnosis not present

## 2016-05-29 DIAGNOSIS — C679 Malignant neoplasm of bladder, unspecified: Secondary | ICD-10-CM | POA: Diagnosis not present

## 2016-07-20 ENCOUNTER — Other Ambulatory Visit: Payer: Self-pay | Admitting: Dermatology

## 2016-07-20 DIAGNOSIS — C44329 Squamous cell carcinoma of skin of other parts of face: Secondary | ICD-10-CM | POA: Diagnosis not present

## 2016-08-05 DIAGNOSIS — E789 Disorder of lipoprotein metabolism, unspecified: Secondary | ICD-10-CM | POA: Diagnosis not present

## 2016-08-05 DIAGNOSIS — E118 Type 2 diabetes mellitus with unspecified complications: Secondary | ICD-10-CM | POA: Diagnosis not present

## 2016-08-12 DIAGNOSIS — L309 Dermatitis, unspecified: Secondary | ICD-10-CM | POA: Diagnosis not present

## 2016-08-12 DIAGNOSIS — E118 Type 2 diabetes mellitus with unspecified complications: Secondary | ICD-10-CM | POA: Diagnosis not present

## 2016-08-12 DIAGNOSIS — I10 Essential (primary) hypertension: Secondary | ICD-10-CM | POA: Diagnosis not present

## 2016-08-12 DIAGNOSIS — N400 Enlarged prostate without lower urinary tract symptoms: Secondary | ICD-10-CM | POA: Diagnosis not present

## 2016-08-31 DIAGNOSIS — L905 Scar conditions and fibrosis of skin: Secondary | ICD-10-CM | POA: Diagnosis not present

## 2016-08-31 DIAGNOSIS — Z85828 Personal history of other malignant neoplasm of skin: Secondary | ICD-10-CM | POA: Diagnosis not present

## 2016-09-09 DIAGNOSIS — C678 Malignant neoplasm of overlapping sites of bladder: Secondary | ICD-10-CM | POA: Diagnosis not present

## 2016-09-09 DIAGNOSIS — R8271 Bacteriuria: Secondary | ICD-10-CM | POA: Diagnosis not present

## 2016-09-14 DIAGNOSIS — C678 Malignant neoplasm of overlapping sites of bladder: Secondary | ICD-10-CM | POA: Diagnosis not present

## 2016-09-16 ENCOUNTER — Other Ambulatory Visit: Payer: Self-pay | Admitting: Urology

## 2016-09-16 NOTE — Progress Notes (Signed)
LM on VM for Selita to inform Dr. Alinda Money to have him place orders in [facility]!

## 2016-09-23 NOTE — Progress Notes (Signed)
08-12-16 Office note on chart 08-12-16 Labs on chart

## 2016-09-23 NOTE — Patient Instructions (Addendum)
[name] [name]  09/23/2016   Your procedure is scheduled on: 09-28-16 Report to [place]  elevators to 3rd floor to [place] at [facility].   Call this number if you have problems the morning of surgery [phone]    Remember: ONLY 1 PERSON MAY GO WITH YOU TO SHORT STAY TO GET  READY MORNING OF YOUR SURGERY.  Do not eat after Midnight.You may have a clear liquid diet from Midnight until 11AM. Then nothing by mouth.     CLEAR LIQUID DIET   Foods Allowed                                                                     Foods Excluded  Coffee and tea, regular and decaf                             liquids that you cannot  Plain Jell-O in any flavor                                             see through such as: Fruit ices (not with fruit pulp)                                     milk, soups, orange juice  Iced Popsicles                                    All solid food Carbonated beverages, regular and diet                                    Cranberry, grape and apple juices Sports drinks like Gatorade Lightly seasoned clear broth or consume(fat free) Sugar, honey syrup  Sample Menu Breakfast                                Lunch                                     Supper Cranberry juice                    Beef broth                            Chicken broth Jell-O                                     Grape juice  Apple juice Coffee or tea                        Jell-O                                      Popsicle                                                Coffee or tea                        Coffee or tea  _____________________________________________________________________     Take these medicines the morning of surgery with A SIP OF WATER: Dutasteride (Avodart), Memantine (Namenda), and Metoprolol    DO NOT TAKE ANY DIABETIC MEDICATIONS DAY OF YOUR SURGERY                               You  may not have any metal on your body including hair pins and              piercings  Do not wear jewelry, make-up, lotions, powders or perfumes, deodorant                          Men may shave face and neck.   Do not bring valuables to the hospital. [place].  Contacts, dentures or bridgework may not be worn into surgery.      Patients discharged the day of surgery will not be allowed to drive home.  Name and phone number of your driver:  Special Instructions: N/A              Please read over the following fact sheets you were given: _____________________________________________________________________  [facility] - Preparing for Surgery Before surgery, you can play an important role.  Because skin is not sterile, your skin needs to be as free of germs as possible.  You can reduce the number of germs on your skin by washing with CHG (chlorahexidine gluconate) soap before surgery.  CHG is an antiseptic cleaner which kills germs and bonds with the skin to continue killing germs even after washing. Please DO NOT use if you have an allergy to CHG or antibacterial soaps.  If your skin becomes reddened/irritated stop using the CHG and inform your nurse when you arrive at Short Stay. Do not shave (including legs and underarms) for at least 48 hours prior to the first CHG shower.  You may shave your face/neck. Please follow these instructions carefully:  1.  Shower with CHG Soap the night before surgery and the  morning of Surgery.  2.  If you choose to wash your hair, wash your hair first as usual with your  normal  shampoo.  3.  After you shampoo, rinse your hair and body thoroughly to remove the  shampoo.                           4.  Use CHG as you would  any other liquid soap.  You can apply chg directly  to the skin and wash                       Gently with a scrungie or clean washcloth.  5.  Apply the CHG Soap to your body ONLY FROM THE  NECK DOWN.   Do not use on face/ open                           Wound or open sores. Avoid contact with eyes, ears mouth and genitals (private parts).                       Wash face,  Genitals (private parts) with your normal soap.             6.  Wash thoroughly, paying special attention to the area where your surgery  will be performed.  7.  Thoroughly rinse your body with warm water from the neck down.  8.  DO NOT shower/wash with your normal soap after using and rinsing off  the CHG Soap.                9.  Pat yourself dry with a clean towel.            10.  Wear clean pajamas.            11.  Place clean sheets on your bed the night of your first shower and do not  sleep with pets. Day of Surgery : Do not apply any lotions/deodorants the morning of surgery.  Please wear clean clothes to the hospital/surgery center.  FAILURE TO FOLLOW THESE INSTRUCTIONS MAY RESULT IN THE CANCELLATION OF YOUR SURGERY PATIENT SIGNATURE_________________________________  NURSE SIGNATURE__________________________________  ________________________________________________________________________  How to Manage Your Diabetes Before and After Surgery  Why is it important to control my blood sugar before and after surgery? . Improving blood sugar levels before and after surgery helps healing and can limit problems. . A way of improving blood sugar control is eating a healthy diet by: o  Eating less sugar and carbohydrates o  Increasing activity/exercise o  Talking with your doctor about reaching your blood sugar goals . High blood sugars (greater than 180 mg/dL) can raise your risk of infections and slow your recovery, so you will need to focus on controlling your diabetes during the weeks before surgery. . Make sure that the doctor who takes care of your diabetes knows about your planned surgery including the date and location.  How do I manage my blood sugar before surgery? . Check your blood sugar at  least 4 times a day, starting 2 days before surgery, to make sure that the level is not too high or low. o Check your blood sugar the morning of your surgery when you wake up and every 2 hours until you get to the Short Stay unit. . If your blood sugar is less than 70 mg/dL, you will need to treat for low blood sugar: o Do not take insulin. o Treat a low blood sugar (less than 70 mg/dL) with  cup of clear juice (cranberry or apple), 4 glucose tablets, OR glucose gel. o Recheck blood sugar in 15 minutes after treatment (to make sure it is greater than 70 mg/dL). If your blood sugar is not greater than 70 mg/dL on recheck, call [phone] for further instructions. . Report  your blood sugar to the short stay nurse when you get to Short Stay.  . If you are admitted to the hospital after surgery: o Your blood sugar will be checked by the staff and you will probably be given insulin after surgery (instead of oral diabetes medicines) to make sure you have good blood sugar levels. o The goal for blood sugar control after surgery is 80-180 mg/dL.   WHAT DO I DO ABOUT MY DIABETES MEDICATION?  [name] Do not take oral diabetes medicines (pills) the morning of surgery.  .    Patient Signature:  Date:   Nurse Signature:  Date:   Reviewed and Endorsed by [facility] Patient Education Committee, August 2015

## 2016-09-25 ENCOUNTER — Encounter (HOSPITAL_COMMUNITY): Payer: Self-pay

## 2016-09-25 ENCOUNTER — Ambulatory Visit (HOSPITAL_COMMUNITY)
Admission: RE | Admit: 2016-09-25 | Discharge: 2016-09-25 | Disposition: A | Discharge status: Home / Self Care | Payer: Medicare Other | Source: Ambulatory Visit | Attending: Anesthesiology | Admitting: Anesthesiology

## 2016-09-25 ENCOUNTER — Encounter (HOSPITAL_COMMUNITY)
Admission: RE | Admit: 2016-09-25 | Discharge: 2016-09-25 | Disposition: A | Discharge status: Home / Self Care | Payer: Medicare Other | Source: Ambulatory Visit | Attending: Urology | Admitting: Urology

## 2016-09-25 DIAGNOSIS — C679 Malignant neoplasm of bladder, unspecified: Secondary | ICD-10-CM | POA: Insufficient documentation

## 2016-09-25 DIAGNOSIS — Z01818 Encounter for other preprocedural examination: Secondary | ICD-10-CM | POA: Diagnosis present

## 2016-09-25 DIAGNOSIS — Z01810 Encounter for preprocedural cardiovascular examination: Secondary | ICD-10-CM | POA: Insufficient documentation

## 2016-09-25 DIAGNOSIS — Z01812 Encounter for preprocedural laboratory examination: Secondary | ICD-10-CM | POA: Diagnosis present

## 2016-09-25 LAB — CBC
HCT: 41 % (ref 39.0–52.0)
HEMOGLOBIN: 14.1 g/dL (ref 13.0–17.0)
MCH: 28.7 pg (ref 26.0–34.0)
MCHC: 34.4 g/dL (ref 30.0–36.0)
MCV: 83.3 fL (ref 78.0–100.0)
PLATELETS: 248 10*3/uL (ref 150–400)
RBC: 4.92 MIL/uL (ref 4.22–5.81)
RDW: 12.7 % (ref 11.5–15.5)
WBC: 11.3 10*3/uL — AB (ref 4.0–10.5)

## 2016-09-25 LAB — BASIC METABOLIC PANEL WITH GFR
Anion gap: 10 (ref 5–15)
BUN: 18 mg/dL (ref 6–20)
CO2: 28 mmol/L (ref 22–32)
Calcium: 9.8 mg/dL (ref 8.9–10.3)
Chloride: 102 mmol/L (ref 101–111)
Creatinine, Ser: 0.91 mg/dL (ref 0.61–1.24)
GFR calc Af Amer: >60 mL/min
GFR calc non Af Amer: >60 mL/min
Glucose, Bld: 221 mg/dL — ABNORMAL HIGH (ref 65–99)
Potassium: 4.6 mmol/L (ref 3.5–5.1)
Sodium: 140 mmol/L (ref 135–145)

## 2016-09-25 LAB — GLUCOSE, CAPILLARY: Glucose-Capillary: 239 mg/dL — ABNORMAL HIGH (ref 65–99)

## 2016-09-25 NOTE — Progress Notes (Signed)
Chart forwarded to Short Stay for 09-28-16 procedure @1430 . EKG and CXR was not finalized,  HGA1C was in process. Short Stay nurses made aware for follow-up.

## 2016-09-25 NOTE — H&P (Signed)
Office Visit Report     09/14/2016   --------------------------------------------------------------------------------   [name] [name]  MRN: [id number]  PRIMARY CARE:  [name] Creek, MD  DOB: May 22, 1941, 76 year old Male  REFERRING:  [name] Gareth Eagle, MD  SSN: [id number]  PROVIDER:  Raynelle [name], M.D.    LOCATION:  [facility] [phone]   --------------------------------------------------------------------------------   CC/HPI: Urothelial carcinoma of the bladder   [name] [name] returns today after having presented last week for surveillance cystoscopy. He was noted to have a urinary tract infection. His urine culture interestingly had grown out lactobacillus. He has been empirically treated with ciprofloxacin pending his culture results. His urinalysis today appears clear. He has been completely asymptomatic and denies any gross hematuria. His initial surveillance cystoscopy by [name] [name] following intravesical gemcitabine had been negative. He follows up today for his next scheduled surveillance cystoscopy.     ALLERGIES: Nitrofurantoin Monohyd Macro CAPS Sulfa Drugs Tetracyclines    MEDICATIONS: Cipro 500 mg tablet 1 tablet PO BID  Potassium Citrate Er 10 meq (1,080 mg) tablet, extended release TAKE 1 TABLET BY MOUTH 3 TIMES A DAY  Avodart 0.5 mg capsule 0 Oral  B-Complex 400 mcg tablet Oral  Fish Oil CAPS Oral  Fluorouracil 5 % cream External  Fluticasone Propionate 0.05 % External Cream External  Hydrochlorothiazide 25 mg tablet Oral  MetFORMIN HCl - 500 MG Oral Tablet Oral  Namenda Xr 28 mg capsule sprinkle, extended release 24 hr Oral  Onglyza 5 mg tablet Oral  Perindopril Erbumine 4 MG Oral Tablet Oral  Potassium Citrate Er 10 meq (1,080 mg) tablet, extended release 0 Oral  Vitamin B-12 100 mcg tablet Oral  Vitamin C 500 mg tablet Oral  Vitamin E TABS Oral     GU PSH: Bladder Instill AntiCA Agent - 2016 Cysto Uretero Lithotripsy - 2010 Cystoscopy  Fulguration - 2010 Cystoscopy Insert Stent - 2015, 2010, 2010, 2008 Cystoscopy TURBT <2 cm - 03/02/2016, 2015, 2013 Cystoscopy TURBT 2-5 cm - 11/18/2015, 05/21/2015, 2016 Cystoscopy Ureteroscopy - 2015 Percut Stone Removal >2cm - 2015 Ureteroscopic stone removal - 2008      PSH Notes: Cystoscopy With Fulguration Medium Lesion (2-5cm), Cystoscopy With Fulguration Medium Lesion (2-5cm), Bladder Injection Of Cancer Treatment, Percutaneous Lithotomy For Stone Over 2cm., Cystoscopy With Insertion Of Ureteral Stent Left, Cystoscopy With Ureteroscopy Left, Cystoscopy With Fulguration Small Lesion (5-58mm), Cystoscopy With Fulguration Small Lesion (5-79mm), Cystoscopy With Pyeloscopy With Lithotripsy, Cystoscopy With Insertion Of Ureteral Stent Right, Cystoscopy With Fulguration, Cystoscopy With Insertion Of Ureteral Stent Right, Cystoscopy With Ureteroscopy With Removal Of Calculus, Cystoscopy With Insertion Of Ureteral Stent Left   NON-GU PSH: None   GU PMH: Bladder Cancer, overlapping sites - 12/03/2015, Malignant neoplasm of overlapping sites of bladder, - 10/10/2015 Urinary Urgency, Urinary urgency - 05/31/2015 BPH [name]/LUTS, Benign prostatic hyperplasia (BPH) with straining on urination - 11/26/2014 Kidney Stone, Nephrolithiasis - 11/26/2014      PMH Notes:   [name] [name] is a 76 year old previously followed by Dr. Tresa [name]. 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 He was noted to have a recurrence in 2013 and underwent TURBT in July 2013. He was also incidentally noted to have a stricture of the distal left ureter on RPG studies at that time and a brush biopsy was performed. He was found to  have high grade Ta urothelial carcinoma in February 2016.    Oct 2007: TURBT, Low grade Ta  Jul 2013: TURBT (right bladder neck 1.5 cm tumor, low grade Ta),brush biopsy of  distal left ureteral stricture (no malignancy)  Mar 2015: TURBT - Low grade Ta  Feb 2016: TURBT - High grade Ta, [facility]  Mar-Apr 2016: Induction BCG  Jul 2016: Maintenance BCG  Oct 2016: Multiple recurrent tumors noted on surveillance cystoscopy  Dec 2016: TUR - Multiple tumors over right posterior bladder - High grade, Ta  Dec 2016 - Feb 2017: 6 week induction BCG  May 2017: TURBT - High grade, Ta urothelial carcinoma (two tumors)  Oct 2017: Completed induction 6 week course of intravesical gemcitabine [facility] [name] [name])   2) Nephrolithiasis: He has a long standing history of calcium oxalate stones. He was incidentally noted to have a large partial staghorn calculus during retrograde pyelography in February 2015.  Apr 2015: L PCNL   3) Prostate cancer screening: We discontinued screening in 2014 due to his low risk for prostate cancer at that time and progressive dementia.  Last PSA: 0.35 (June 2014)   4) BPH/LUTS: He is treated with Avodart.     NON-GU PMH: Bacteriuria - 09/09/2016 Diabetes Type 2 Hypertension Unspecified dementia without behavioral disturbance    FAMILY HISTORY: No pertinent family history - Other Prostate Cancer - No Family History   SOCIAL HISTORY: Marital Status: Married Current Smoking Status: Patient has never smoked.  Has never drank.  Drinks 2 caffeinated drinks per day. Has not had a blood transfusion.     Notes: Never A Smoker   REVIEW OF SYSTEMS:    GU Review Male:   Patient denies frequent urination, hard to postpone urination, burning/ pain with urination, get up at night to urinate, leakage of urine, stream starts and stops, trouble starting your streams, and have to strain to urinate .  Gastrointestinal (Upper):   Patient denies nausea and vomiting.  Gastrointestinal (Lower):   Patient denies diarrhea and constipation.  Constitutional:   Patient denies fever, night sweats, weight loss, and fatigue.  Skin:   Patient denies skin rash/ lesion and  itching.  Eyes:   Patient denies blurred vision and double vision.  Ears/ Nose/ Throat:   Patient denies sore throat and sinus problems.  Hematologic/Lymphatic:   Patient denies swollen glands and easy bruising.  Cardiovascular:   Patient denies leg swelling and chest pains.  Respiratory:   Patient denies cough and shortness of breath.  Endocrine:   Patient denies excessive thirst.  Musculoskeletal:   Patient denies back pain and joint pain.  Neurological:   Patient denies headaches and dizziness.  Psychologic:   Patient denies depression and anxiety.   VITAL SIGNS:      09/14/2016 02:51 PM  Weight 210 lb / 95.25 kg  Height 71 in / 180.34 cm  BP 133/72 mmHg  Pulse 97 /min  BMI 29.3 kg/m   GU PHYSICAL EXAMINATION:    Urethral Meatus: Normal size. No lesion, no wart, no discharge, no polyp. Normal location.   MULTI-SYSTEM PHYSICAL EXAMINATION:    Constitutional: Well-nourished. No physical deformities. Normally developed. Good grooming.     PAST DATA REVIEWED:  Source Of History:  Patient  Records Review:   Previous Patient Records  Urine Test Review:   Urinalysis   11/23/12 11/18/11 11/28/10 01/08/10 12/20/03  PSA  Total PSA 0.35  0.42  0.46  0.49  2.42     PROCEDURES:  Flexible Cystoscopy - 52000  Indication: Urothelial carcinoma of bladder Risks, benefits, and potential complications of the procedure were discussed with the patient including infection, bleeding, voiding discomfort, urinary retention, fever, chills, sepsis, and others. All questions were answered. Informed consent was obtained. Sterile technique and intraurethral analgesia were used.  Meatus:  Normal size. Normal location. Normal condition.  Urethra:  No strictures.  External Sphincter:  Normal.  Verumontanum:  Normal.  Prostate:  Obstructing. Moderate hyperplasia.  Bladder Neck:  Non-obstructing.  Ureteral Orifices:  Normal location. Normal size. Normal shape. Effluxed clear urine.  Bladder:   Systematic examination of the bladder revealed 2 small papillary appearing tumors with one measuring less than 1 cm toward the dome. There was another 1.5 cm papillary tumor in the midline posteriorly. No other tumors were identified. No other concerning mucosal lesions were identified. A bladder washing was obtained for cytology.      Chaperone: AJ The procedure was well-tolerated and without complications. Instructions were given to call the office immediately if questions or problems.         Urinalysis Dipstick Dipstick Cont'd  Color: Yellow Bilirubin: Neg  Appearance: Clear Ketones: Neg  Specific Gravity: 1.020 Blood: Neg  pH: 5.5 Protein: Neg  Glucose: Neg Urobilinogen: 0.2    Nitrites: Neg    Leukocyte Esterase: Neg    ASSESSMENT:      ICD-10 Details  1 GU:   Bladder Cancer, overlapping sites - C67.8    PLAN:           Orders Labs Urine Cytology  Lab Notes: Washing          Document Letter(s):  Created for Patient: Clinical Summary         Notes:   1. Urothelial carcinoma of the bladder: He does appear to have 2 areas of recurrence. These do appear to be low grade appearing although I have recommended that Mr. Schroeter proceeded for transurethral resection for pathologic diagnosis and restaging. I will plan to talk to [name] [name] further once we have the final pathology results as we had previously discussed possibly repeating induction and going forward with maintenance therapy in the event he has a recurrence. We discussed this procedure in detail today with him and his wife. We discussed the potential risks, complications, and expected recovery process. He gives informed consent to proceed as does his wife.   2. Urolithiasis: He has wife had discussed proceeding with a 24-hour urine to reevaluate his need for ongoing potassium citrate therapy. After discussion with his wife, she simply wishes to discontinue this medication and feels that he would not be able to collect a  24-hour urine with his severe dementia.   Cc: Dr. Carla Drape  Dr. Verlee Monte    E & M CODE: I spent at least 15 minutes face to face with the patient, more than 50% of that time was spent on counseling and/or coordinating care.     * Signed by [name] [name], M.D. on 09/14/16 at 4:06 PM (EDT)*

## 2016-09-26 LAB — HEMOGLOBIN A1C
Hgb A1c MFr Bld: 8.5 % — ABNORMAL HIGH (ref 4.8–5.6)
MEAN PLASMA GLUCOSE: 197 mg/dL

## 2016-09-28 ENCOUNTER — Ambulatory Visit (HOSPITAL_COMMUNITY)
Admission: RE | Admit: 2016-09-28 | Discharge: 2016-09-28 | Disposition: A | Discharge status: Home / Self Care | Payer: Medicare Other | Source: Ambulatory Visit | Attending: Urology | Admitting: Urology

## 2016-09-28 ENCOUNTER — Encounter (HOSPITAL_COMMUNITY)
Admission: RE | Disposition: A | Discharge status: Home / Self Care | Payer: Self-pay | Source: Ambulatory Visit | Attending: Urology

## 2016-09-28 ENCOUNTER — Ambulatory Visit (HOSPITAL_COMMUNITY): Payer: Medicare Other

## 2016-09-28 ENCOUNTER — Ambulatory Visit (HOSPITAL_COMMUNITY): Payer: Medicare Other | Admitting: Anesthesiology

## 2016-09-28 ENCOUNTER — Encounter (HOSPITAL_COMMUNITY): Payer: Self-pay | Admitting: *Deleted

## 2016-09-28 DIAGNOSIS — N401 Enlarged prostate with lower urinary tract symptoms: Secondary | ICD-10-CM | POA: Diagnosis not present

## 2016-09-28 DIAGNOSIS — R3915 Urgency of urination: Secondary | ICD-10-CM | POA: Diagnosis not present

## 2016-09-28 DIAGNOSIS — F0390 Unspecified dementia without behavioral disturbance: Secondary | ICD-10-CM | POA: Diagnosis not present

## 2016-09-28 DIAGNOSIS — N135 Crossing vessel and stricture of ureter without hydronephrosis: Secondary | ICD-10-CM | POA: Insufficient documentation

## 2016-09-28 DIAGNOSIS — Z79899 Other long term (current) drug therapy: Secondary | ICD-10-CM | POA: Diagnosis not present

## 2016-09-28 DIAGNOSIS — C678 Malignant neoplasm of overlapping sites of bladder: Secondary | ICD-10-CM | POA: Diagnosis not present

## 2016-09-28 DIAGNOSIS — I10 Essential (primary) hypertension: Secondary | ICD-10-CM | POA: Insufficient documentation

## 2016-09-28 DIAGNOSIS — D090 Carcinoma in situ of bladder: Secondary | ICD-10-CM | POA: Diagnosis not present

## 2016-09-28 DIAGNOSIS — R3916 Straining to void: Secondary | ICD-10-CM | POA: Insufficient documentation

## 2016-09-28 DIAGNOSIS — Z7984 Long term (current) use of oral hypoglycemic drugs: Secondary | ICD-10-CM | POA: Diagnosis not present

## 2016-09-28 DIAGNOSIS — N3289 Other specified disorders of bladder: Secondary | ICD-10-CM | POA: Diagnosis not present

## 2016-09-28 DIAGNOSIS — N200 Calculus of kidney: Secondary | ICD-10-CM | POA: Diagnosis not present

## 2016-09-28 DIAGNOSIS — C671 Malignant neoplasm of dome of bladder: Secondary | ICD-10-CM | POA: Diagnosis not present

## 2016-09-28 DIAGNOSIS — Z87442 Personal history of urinary calculi: Secondary | ICD-10-CM | POA: Insufficient documentation

## 2016-09-28 DIAGNOSIS — E119 Type 2 diabetes mellitus without complications: Secondary | ICD-10-CM | POA: Diagnosis not present

## 2016-09-28 DIAGNOSIS — C679 Malignant neoplasm of bladder, unspecified: Secondary | ICD-10-CM | POA: Diagnosis not present

## 2016-09-28 HISTORY — PX: TRANSURETHRAL RESECTION OF BLADDER TUMOR: SHX2575

## 2016-09-28 HISTORY — PX: CYSTOSCOPY W/ RETROGRADES: SHX1426

## 2016-09-28 LAB — GLUCOSE, CAPILLARY: Glucose-Capillary: 176 mg/dL — ABNORMAL HIGH (ref 65–99)

## 2016-09-28 SURGERY — TURBT (TRANSURETHRAL RESECTION OF BLADDER TUMOR)
Anesthesia: General | Site: Urethra

## 2016-09-28 MED ORDER — CEFAZOLIN SODIUM-DEXTROSE 2-4 GM/100ML-% IV SOLN
2.0000 g | INTRAVENOUS | Status: AC
  Administered 2016-09-28: 2 g via INTRAVENOUS
  Filled 2016-09-28: qty 100

## 2016-09-28 MED ORDER — PROPOFOL 10 MG/ML IV BOLUS
INTRAVENOUS | Status: DC | PRN
  Administered 2016-09-28: 150 mg via INTRAVENOUS

## 2016-09-28 MED ORDER — ONDANSETRON HCL 4 MG/2ML IJ SOLN
INTRAMUSCULAR | Status: DC | PRN
  Administered 2016-09-28: 4 mg via INTRAVENOUS

## 2016-09-28 MED ORDER — PROPOFOL 10 MG/ML IV BOLUS
INTRAVENOUS | Status: AC
  Filled 2016-09-28: qty 20

## 2016-09-28 MED ORDER — FENTANYL CITRATE (PF) 250 MCG/5ML IJ SOLN: INTRAMUSCULAR | Status: DC | PRN

## 2016-09-28 MED ORDER — ONDANSETRON HCL 4 MG/2ML IJ SOLN
INTRAMUSCULAR | Status: AC
  Filled 2016-09-28: qty 6

## 2016-09-28 MED ORDER — SODIUM CHLORIDE 0.9 % IR SOLN
Status: DC | PRN
  Administered 2016-09-28 (×2): 3000 mL via INTRAVESICAL

## 2016-09-28 MED ORDER — PHENYLEPHRINE 40 MCG/ML (10ML) SYRINGE FOR IV PUSH (FOR BLOOD PRESSURE SUPPORT)
PREFILLED_SYRINGE | INTRAVENOUS | Status: DC | PRN
  Administered 2016-09-28: 120 ug via INTRAVENOUS
  Administered 2016-09-28: 40 ug via INTRAVENOUS
  Administered 2016-09-28: 120 ug via INTRAVENOUS
  Administered 2016-09-28: 40 ug via INTRAVENOUS
  Administered 2016-09-28: 80 ug via INTRAVENOUS

## 2016-09-28 MED ORDER — PHENYLEPHRINE 40 MCG/ML (10ML) SYRINGE FOR IV PUSH (FOR BLOOD PRESSURE SUPPORT)
PREFILLED_SYRINGE | INTRAVENOUS | Status: AC
  Filled 2016-09-28: qty 10

## 2016-09-28 MED ORDER — LACTATED RINGERS IV SOLN
INTRAVENOUS | Status: DC
  Administered 2016-09-28 (×3): via INTRAVENOUS

## 2016-09-28 MED ORDER — MIDAZOLAM HCL 2 MG/2ML IJ SOLN
INTRAMUSCULAR | Status: AC
Start: 2016-09-28 — End: 2016-09-28
  Filled 2016-09-28: qty 2

## 2016-09-28 MED ORDER — FENTANYL CITRATE (PF) 100 MCG/2ML IJ SOLN
INTRAMUSCULAR | Status: DC | PRN
  Administered 2016-09-28 (×2): 50 ug via INTRAVENOUS

## 2016-09-28 MED ORDER — PHENAZOPYRIDINE HCL 100 MG PO TABS: 100.0000 mg | ORAL_TABLET | Freq: Three times a day (TID) | ORAL | 0 refills | Status: DC | PRN

## 2016-09-28 MED ORDER — LIDOCAINE 2% (20 MG/ML) 5 ML SYRINGE
INTRAMUSCULAR | Status: DC | PRN
  Administered 2016-09-28: 60 mg via INTRAVENOUS

## 2016-09-28 MED ORDER — 0.9 % SODIUM CHLORIDE (POUR BTL) OPTIME
TOPICAL | Status: DC | PRN
  Administered 2016-09-28: 1000 mL

## 2016-09-28 MED ORDER — LIDOCAINE 2% (20 MG/ML) 5 ML SYRINGE
INTRAMUSCULAR | Status: AC
  Filled 2016-09-28: qty 25

## 2016-09-28 MED ORDER — IOHEXOL 300 MG/ML  SOLN
INTRAMUSCULAR | Status: DC | PRN
  Administered 2016-09-28: 15 mL

## 2016-09-28 MED ORDER — ROCURONIUM BROMIDE 50 MG/5ML IV SOSY
PREFILLED_SYRINGE | INTRAVENOUS | Status: AC
  Filled 2016-09-28: qty 10

## 2016-09-28 MED ORDER — FENTANYL CITRATE (PF) 100 MCG/2ML IJ SOLN
INTRAMUSCULAR | Status: AC
  Filled 2016-09-28: qty 2

## 2016-09-28 SURGICAL SUPPLY — 19 items
BAG URINE DRAINAGE (UROLOGICAL SUPPLIES) IMPLANT
BAG URO CATCHER STRL LF (MISCELLANEOUS) ×3 IMPLANT
CATH INTERMIT  6FR 70CM (CATHETERS) ×3 IMPLANT
CLOTH BEACON ORANGE TIMEOUT ST (SAFETY) ×3 IMPLANT
COVER SURGICAL LIGHT HANDLE (MISCELLANEOUS) ×3 IMPLANT
ELECT LOOP 22F BIPOLAR SML (ELECTROSURGICAL) ×3
ELECT REM PT RETURN 15FT ADLT (MISCELLANEOUS) ×3 IMPLANT
ELECTRODE LOOP 22F BIPOLAR SML (ELECTROSURGICAL) ×1 IMPLANT
EVACUATOR MICROVAS BLADDER (UROLOGICAL SUPPLIES) IMPLANT
GLOVE BIOGEL M STRL SZ7.5 (GLOVE) ×3 IMPLANT
GOWN STRL REUS W/TWL LRG LVL3 (GOWN DISPOSABLE) ×6 IMPLANT
GUIDEWIRE STR DUAL SENSOR (WIRE) ×3 IMPLANT
LOOP CUT BIPOLAR 24F LRG (ELECTROSURGICAL) IMPLANT
MANIFOLD NEPTUNE II (INSTRUMENTS) ×3 IMPLANT
PACK CYSTO (CUSTOM PROCEDURE TRAY) ×3 IMPLANT
SET ASPIRATION TUBING (TUBING) IMPLANT
SYRINGE IRR TOOMEY STRL 70CC (SYRINGE) IMPLANT
TUBING CONNECTING 10 (TUBING) ×2 IMPLANT
TUBING CONNECTING 10' (TUBING) ×1

## 2016-09-28 NOTE — Interval H&P Note (Signed)
History and Physical Interval Note:  09/28/2016 4:30 PM  [name] [name]  has presented today for surgery, with the diagnosis of BLADDER CANCER  The various methods of treatment have been discussed with the patient and family. After consideration of risks, benefits and other options for treatment, the patient has consented to  Procedure(s): TRANSURETHRAL RESECTION OF BLADDER TUMOR (TURBT) (N/A) CYSTOSCOPY WITH RETROGRADE PYELOGRAM (N/A) as a surgical intervention .  The patient's history has been reviewed, patient examined, no change in status, stable for surgery.  I have reviewed the patient's chart and labs.  Questions were answered to the patient's satisfaction.     Flint Hakeem,LES

## 2016-09-28 NOTE — Anesthesia Postprocedure Evaluation (Addendum)
Anesthesia Post Note  Patient: [name] [name]  Procedure(s) Performed: Procedure(s) (LRB): TRANSURETHRAL RESECTION OF BLADDER TUMOR (TURBT) (N/A) CYSTOSCOPY WITH RETROGRADE PYELOGRAM (N/A)  Patient location during evaluation: PACU Anesthesia Type: General Level of consciousness: awake and alert Pain management: pain level controlled Vital Signs Assessment: post-procedure vital signs reviewed and stable Respiratory status: spontaneous breathing, nonlabored ventilation, respiratory function stable and patient connected to nasal cannula oxygen Cardiovascular status: blood pressure returned to baseline and stable Postop Assessment: no signs of nausea or vomiting Anesthetic complications: no       Last Vitals:  Vitals:   09/28/16 1830 09/28/16 1845  BP: 130/69 125/66  Pulse: 70 64  Resp: 13 15  Temp:      Last Pain:  Vitals:   09/28/16 1900  TempSrc:   PainSc: 0-No pain                 Effie Berkshire

## 2016-09-28 NOTE — Discharge Instructions (Signed)

## 2016-09-28 NOTE — Op Note (Signed)
Preoperative diagnosis: 1. Bladder tumors (1.5 cm, 1 cm)  Postoperative diagnosis:  1. Bladder tumors  Procedure:  1. Cystoscopy 2. Transurethral resection of bladder tumors 3. Bilateral retrograde pyelography with interpretation 4. Left ureteroscopy  Surgeon: Pryor Curia. M.D.  Anesthesia: General  Complications: None  Intraoperative findings:  1. Bladder tumor: He was noted to have a 1.5 cm papillary tumor in the mid line posteriorly.  A second bladder tumor measuring approximately 1 cm and was papillary and located at the dome. 2. Retrograde pyelography: Omnipaque contrast was injected with a 6 French ureteral catheter.  On the right side, there was noted be a normal caliber ureterwithout filling defects.  No hydronephrosis and no renal collecting system filling defects.  On the left side, there was noted to be a narrowing in the distal left ureter which had been previously noted. This measured approximately 3 cm.  No other filling defects or other abnormalities were identified along the course of the ureter.  No hydronephrosis and no filling defects in the renal collecting system.  EBL: Minimal  Specimens: 1. Posterior tumor 2. Base of posterior tumor 3. Dome tumor 4. Base of dome tumor  Disposition of specimens: Pathology  Indication: [name] [name] is a patient who was found to have two recurrent bladder tumors. After reviewing the management options for treatment, he elected to proceed with the above surgical procedure(s). We have discussed the potential benefits and risks of the procedure, side effects of the proposed treatment, the likelihood of the patient achieving the goals of the procedure, and any potential problems that might occur during the procedure or recuperation. Informed consent has been obtained.  Description of procedure:  The patient was taken to the operating room and general anesthesia was induced.  The patient was placed in the dorsal  lithotomy position, prepped and draped in the usual sterile fashion, and preoperative antibiotics were administered. A preoperative time-out was performed.   Cystourethroscopy was performed.  The patient's urethra was examined and demonstrated bilobar prostatic hypertrophy.   The bladder was then systematically examined in its entirety. His previous resection sites were noted.  There was a 1.5 cm papillary tumor noted on the posterior aspect of the bladder in the midline.  A second 1 cm papillary tumor was noted just to the right of the dome.  No other abnormalities were identified.  Attention then turned to the left ureteral orifice and a ureteral catheter was used to intubate the ureteral orifice.  Omnipaque contrast was injected through the ureteral catheter and a retrograde pyelogram was performed with findings as dictated above.  Attention then turned to the right ureteral orifice and a ureteral catheter was used to intubate the ureteral orifice.  Omnipaque contrast was injected through the ureteral catheter and a retrograde pyelogram was performed with findings as dictated above.  A 0.38 sensor guidewire was advanced up the left ureter under fluoroscopic guidance.  The 6 French semirigid ureteroscope was advanced next to the wire into the distal ureter where the previously noted stricture had been identified on retrograde pyelogram. As previously noted, no intrinsic abnormality was identified and the ureter was able to be easily navigated indicating that this was not a true stricture and may be related to possible extrinsic pressure but without evidence of an obstructing component.  The bladder was then re-examined after the resectoscope was placed.  Each of the previous bladder tumors were then resected. Using loop cautery resection, the entire tumor was resected and removed for  permanent pathologic analysis from each site and sent as separate specimens.  Separate biopsies were also taken of the  underlying deep bladder tissue and sent as a separate specimen.   Hemostasis was then achieved with the loop cautery and the bladder was emptied and reinspected with no further bleeding noted at the end of the procedure.    The bladder was then emptied and the procedure ended.  The patient appeared to tolerate the procedure well and without complications.  The patient was able to be awakened and transferred to the recovery unit in satisfactory condition.    Pryor Curia MD

## 2016-09-28 NOTE — Progress Notes (Signed)
PACU Notes.....   Patient voided, 50 ml.  Patient denies pain at this time.  Patient IV fluids stopped.  Patient given diet ginger ale and graham crackers for fluid/solid challenge.  Patient sitting in wheelchair with wheels locked, family at bedside.  Patient alert to situation.  NAD at this time.

## 2016-09-28 NOTE — Anesthesia Preprocedure Evaluation (Addendum)
Anesthesia Evaluation  Patient identified by MRN, date of birth, ID band Patient awake    Reviewed: Allergy & Precautions, NPO status , Patient's Chart, lab work & pertinent test results  Airway Mallampati: II  TM Distance: >3 FB     Dental  (+) Dental Advisory Given, Chipped   Pulmonary neg pulmonary ROS,    breath sounds clear to auscultation       Cardiovascular hypertension, Pt. on medications and Pt. on home beta blockers  Rhythm:Regular Rate:Normal     Neuro/Psych PSYCHIATRIC DISORDERS negative neurological ROS     GI/Hepatic negative GI ROS, Neg liver ROS,   Endo/Other  diabetes, Type 2, Oral Hypoglycemic Agents  Renal/GU Renal disease  negative genitourinary   Musculoskeletal negative musculoskeletal ROS (+)   Abdominal   Peds negative pediatric ROS (+)  Hematology negative hematology ROS (+)   Anesthesia Other Findings   Reproductive/Obstetrics negative OB ROS                           Lab Results  Component Value Date   WBC 11.3 (H) 09/25/2016   HGB 14.1 09/25/2016   HCT 41.0 09/25/2016   MCV 83.3 09/25/2016   PLT 248 09/25/2016   Lab Results  Component Value Date   CREATININE 0.91 09/25/2016   BUN 18 09/25/2016   NA 140 09/25/2016   K 4.6 09/25/2016   CL 102 09/25/2016   CO2 28 09/25/2016   Lab Results  Component Value Date   INR 1.08 10/12/2013   INR 0.92 09/28/2013   EKG: normal sinus rhythm.   Anesthesia Physical Anesthesia Plan  ASA: III  Anesthesia Plan: General   Post-op Pain Management:    Induction: Intravenous  Airway Management Planned: LMA  Additional Equipment:   Intra-op Plan:   Post-operative Plan: Extubation in OR  Informed Consent: I have reviewed the patients History and Physical, chart, labs and discussed the procedure including the risks, benefits and alternatives for the proposed anesthesia with the patient or authorized  representative who has indicated his/her understanding and acceptance.   Dental advisory given  Plan Discussed with: CRNA  Anesthesia Plan Comments:         Anesthesia Quick Evaluation

## 2016-09-28 NOTE — Transfer of Care (Signed)
Immediate Anesthesia Transfer of Care Note  Patient: [name] [name]  Procedure(s) Performed: Procedure(s): TRANSURETHRAL RESECTION OF BLADDER TUMOR (TURBT) (N/A) CYSTOSCOPY WITH RETROGRADE PYELOGRAM (N/A)  Patient Location: PACU  Anesthesia Type:General  Level of Consciousness: sedated  Airway & Oxygen Therapy: Patient Spontanous Breathing and Patient connected to face mask oxygen  Post-op Assessment: Report given to RN and Post -op Vital signs reviewed and stable  Post vital signs: Reviewed and stable  Last Vitals:  Vitals:   09/28/16 1441  BP: (!) 143/78  Pulse: 70  Resp: 18  Temp: 37.1 C    Last Pain:  Vitals:   09/28/16 1441  TempSrc: Oral         Complications: No apparent anesthesia complications

## 2016-09-28 NOTE — Anesthesia Procedure Notes (Signed)
Procedure Name: LMA Insertion Date/Time: 09/28/2016 5:31 PM Performed by: Cynda Familia Pre-anesthesia Checklist: Patient identified, Emergency Drugs available, Suction available and Patient being monitored Patient Re-evaluated:Patient Re-evaluated prior to inductionOxygen Delivery Method: Circle System Utilized Preoxygenation: Pre-oxygenation with 100% oxygen Intubation Type: IV induction Ventilation: Mask ventilation without difficulty LMA: LMA inserted LMA Size: 4.0 Tube type: Oral (20 cc air) Number of attempts: 1 Placement Confirmation: positive ETCO2 Tube secured with: Tape Dental Injury: Teeth and Oropharynx as per pre-operative assessment  Comments: Smooth IV induction---[place]--- LMA AM CRNA atraumatic--- teeth and mouth as preop--- bilat BS Shi Robert

## 2016-09-29 ENCOUNTER — Encounter (HOSPITAL_COMMUNITY): Payer: Self-pay | Admitting: Urology

## 2016-10-14 ENCOUNTER — Other Ambulatory Visit: Payer: Self-pay | Admitting: Urology

## 2016-10-14 DIAGNOSIS — R8271 Bacteriuria: Secondary | ICD-10-CM | POA: Diagnosis not present

## 2016-10-14 DIAGNOSIS — C678 Malignant neoplasm of overlapping sites of bladder: Secondary | ICD-10-CM | POA: Diagnosis not present

## 2016-10-15 NOTE — Patient Instructions (Addendum)
[name] [name]  10/15/2016   Your procedure is scheduled on: 10/19/2016    Report to [facility] Main  Entrance Take [place]  elevators to 3rd floor to  [place] at   [place] AM.    Call this number if you have problems the morning of surgery [phone]   Remember: ONLY 1 PERSON MAY GO WITH YOU TO SHORT STAY TO GET  READY MORNING OF [place].  Do not eat food or drink liquids :After Midnight.     Take these medicines the morning of surgery with A SIP OF WATER: Namenda, metoprolol ( toprol), avodart  DO NOT TAKE ANY DIABETIC MEDICATIONS DAY OF YOUR SURGERY                               You may not have any metal on your body including hair pins and              piercings  Do not wear jewelry, , lotions, powders or perfumes, deodorant                         Men may shave face and neck.   Do not bring valuables to the hospital. [place].  Contacts, dentures or bridgework may not be worn into surgery.      Patients discharged the day of surgery will not be allowed to drive home.  Name and phone number of your driver:                Please read over the following fact sheets you were given: _____________________________________________________________________             [facility] - Preparing for Surgery Before surgery, you can play an important role.  Because skin is not sterile, your skin needs to be as free of germs as possible.  You can reduce the number of germs on your skin by washing with CHG (chlorahexidine gluconate) soap before surgery.  CHG is an antiseptic cleaner which kills germs and bonds with the skin to continue killing germs even after washing. Please DO NOT use if you have an allergy to CHG or antibacterial soaps.  If your skin becomes reddened/irritated stop using the CHG and inform your nurse when you arrive at Short Stay. Do not shave (including legs and underarms) for at  least 48 hours prior to the first CHG shower.  You may shave your face/neck. Please follow these instructions carefully:  1.  Shower with CHG Soap the night before surgery and the  morning of Surgery.  2.  If you choose to wash your hair, wash your hair first as usual with your  normal  shampoo.  3.  After you shampoo, rinse your hair and body thoroughly to remove the  shampoo.                           4.  Use CHG as you would any other liquid soap.  You can apply chg directly  to the skin and wash                       Gently  with a scrungie or clean washcloth.  5.  Apply the CHG Soap to your body ONLY FROM THE NECK DOWN.   Do not use on face/ open                           Wound or open sores. Avoid contact with eyes, ears mouth and genitals (private parts).                       Wash face,  Genitals (private parts) with your normal soap.             6.  Wash thoroughly, paying special attention to the area where your surgery  will be performed.  7.  Thoroughly rinse your body with warm water from the neck down.  8.  DO NOT shower/wash with your normal soap after using and rinsing off  the CHG Soap.                9.  Pat yourself dry with a clean towel.            10.  Wear clean pajamas.            11.  Place clean sheets on your bed the night of your first shower and do not  sleep with pets. Day of Surgery : Do not apply any lotions/deodorants the morning of surgery.  Please wear clean clothes to the hospital/surgery center.  FAILURE TO FOLLOW THESE INSTRUCTIONS MAY RESULT IN THE CANCELLATION OF YOUR SURGERY PATIENT SIGNATURE_________________________________  NURSE SIGNATURE__________________________________  ________________________________________________________________________

## 2016-10-16 ENCOUNTER — Encounter (HOSPITAL_COMMUNITY): Payer: Self-pay

## 2016-10-16 ENCOUNTER — Encounter (HOSPITAL_COMMUNITY)
Admission: RE | Admit: 2016-10-16 | Discharge: 2016-10-16 | Disposition: A | Discharge status: Home / Self Care | Payer: Medicare Other | Source: Ambulatory Visit | Attending: Urology | Admitting: Urology

## 2016-10-16 DIAGNOSIS — R3915 Urgency of urination: Secondary | ICD-10-CM | POA: Diagnosis not present

## 2016-10-16 DIAGNOSIS — F0390 Unspecified dementia without behavioral disturbance: Secondary | ICD-10-CM | POA: Diagnosis not present

## 2016-10-16 DIAGNOSIS — Z79899 Other long term (current) drug therapy: Secondary | ICD-10-CM | POA: Diagnosis not present

## 2016-10-16 DIAGNOSIS — E119 Type 2 diabetes mellitus without complications: Secondary | ICD-10-CM | POA: Diagnosis not present

## 2016-10-16 DIAGNOSIS — N3289 Other specified disorders of bladder: Secondary | ICD-10-CM | POA: Diagnosis not present

## 2016-10-16 DIAGNOSIS — C678 Malignant neoplasm of overlapping sites of bladder: Secondary | ICD-10-CM | POA: Diagnosis not present

## 2016-10-16 DIAGNOSIS — N401 Enlarged prostate with lower urinary tract symptoms: Secondary | ICD-10-CM | POA: Diagnosis not present

## 2016-10-16 DIAGNOSIS — I10 Essential (primary) hypertension: Secondary | ICD-10-CM | POA: Diagnosis not present

## 2016-10-16 DIAGNOSIS — R8271 Bacteriuria: Secondary | ICD-10-CM | POA: Diagnosis not present

## 2016-10-16 DIAGNOSIS — Z87442 Personal history of urinary calculi: Secondary | ICD-10-CM | POA: Diagnosis not present

## 2016-10-16 DIAGNOSIS — R3916 Straining to void: Secondary | ICD-10-CM | POA: Diagnosis not present

## 2016-10-16 LAB — BASIC METABOLIC PANEL
Anion gap: 11 (ref 5–15)
BUN: 23 mg/dL — AB (ref 6–20)
CHLORIDE: 100 mmol/L — AB (ref 101–111)
CO2: 27 mmol/L (ref 22–32)
CREATININE: 0.85 mg/dL (ref 0.61–1.24)
Calcium: 9.2 mg/dL (ref 8.9–10.3)
GFR calc Af Amer: >60 mL/min (ref >60)
GFR calc non Af Amer: >60 mL/min (ref >60)
GLUCOSE: 249 mg/dL — AB (ref 65–99)
POTASSIUM: 4.1 mmol/L (ref 3.5–5.1)
SODIUM: 138 mmol/L (ref 135–145)

## 2016-10-16 LAB — CBC
HCT: 41.8 % (ref 39.0–52.0)
HEMOGLOBIN: 14.1 g/dL (ref 13.0–17.0)
MCH: 28 pg (ref 26.0–34.0)
MCHC: 33.7 g/dL (ref 30.0–36.0)
MCV: 82.9 fL (ref 78.0–100.0)
Platelets: 230 10*3/uL (ref 150–400)
RBC: 5.04 MIL/uL (ref 4.22–5.81)
RDW: 12.6 % (ref 11.5–15.5)
WBC: 10 10*3/uL (ref 4.0–10.5)

## 2016-10-16 LAB — GLUCOSE, CAPILLARY: GLUCOSE-CAPILLARY: 230 mg/dL — AB (ref 65–99)

## 2016-10-16 NOTE — H&P (Signed)
Office Visit Report     10/14/2016   --------------------------------------------------------------------------------   [name] [name]  MRN: [id number]  PRIMARY CARE:  Arleta [place], MD  DOB: 1940-09-08, 76 year old Male  REFERRING:  Raynelle Bring, MD  SSN: [id number]  PROVIDER:  Raynelle Bring, M.D.    LOCATION:  [facility] [phone]   --------------------------------------------------------------------------------   CC/HPI: High risk non-muscle invasive bladder cancer   [name] [name] returns today after his recent TURBT for a small posterior papillary bladder tumor and a smaller papillary tumor at the dome of the bladder. He has recovered uneventfully and has no specific complaints today. He denies dysuria or hematuria.     ALLERGIES: Nitrofurantoin Monohyd Macro CAPS Sulfa Drugs Tetracyclines    MEDICATIONS: Potassium Citrate Er 10 meq (1,080 mg) tablet, extended release TAKE 1 TABLET BY MOUTH 3 TIMES A DAY  Avodart 0.5 mg capsule 0 Oral  B-Complex 400 mcg tablet Oral  Fish Oil CAPS Oral  Fluorouracil 5 % cream External  Fluticasone Propionate 0.05 % External Cream External  Hydrochlorothiazide 25 mg tablet Oral  MetFORMIN HCl - 500 MG Oral Tablet Oral  Namenda Xr 28 mg capsule sprinkle, extended release 24 hr Oral  Onglyza 5 mg tablet Oral  Perindopril Erbumine 4 MG Oral Tablet Oral  Potassium Citrate Er 10 meq (1,080 mg) tablet, extended release 0 Oral  Vitamin B-12 100 mcg tablet Oral  Vitamin C 500 mg tablet Oral  Vitamin E TABS Oral     GU PSH: Bladder Instill AntiCA Agent - 2016 Cysto Uretero Lithotripsy - 2010 Cystoscopy - 09/14/2016 Cystoscopy Fulguration - 2010 Cystoscopy Insert Stent - 2015, 2010, 2010, 2008 Cystoscopy TURBT <2 cm - 09/28/2016, 03/02/2016, 2015, 2013 Cystoscopy TURBT 2-5 cm - 11/18/2015, 05/21/2015, 2016 Cystoscopy Ureteroscopy, Left - 09/28/2016, 2015 Percut Stone Removal >2cm - 2015 Ureteroscopic stone removal - 2008       PSH Notes: Cystoscopy With Fulguration Medium Lesion (2-5cm), Cystoscopy With Fulguration Medium Lesion (2-5cm), Bladder Injection Of Cancer Treatment, Percutaneous Lithotomy For Stone Over 2cm., Cystoscopy With Insertion Of Ureteral Stent Left, Cystoscopy With Ureteroscopy Left, Cystoscopy With Fulguration Small Lesion (5-66mm), Cystoscopy With Fulguration Small Lesion (5-52mm), Cystoscopy With Pyeloscopy With Lithotripsy, Cystoscopy With Insertion Of Ureteral Stent Right, Cystoscopy With Fulguration, Cystoscopy With Insertion Of Ureteral Stent Right, Cystoscopy With Ureteroscopy With Removal Of Calculus, Cystoscopy With Insertion Of Ureteral Stent Left   NON-GU PSH: None   GU PMH: Bladder Cancer overlapping sites - 12/03/2015, Malignant neoplasm of overlapping sites of bladder, - 10/10/2015 Urinary Urgency, Urinary urgency - 05/31/2015 BPH w/LUTS, Benign prostatic hyperplasia (BPH) with straining on urination - 11/26/2014 Renal calculus, Nephrolithiasis - 11/26/2014      PMH Notes:   [name] [name] is a 76 year old previously followed by Dr. Tresa Endo. 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 He was noted to have a recurrence in 2013 and underwent TURBT in July 2013. He was also incidentally noted to have a stricture of the distal left ureter on RPG studies at that time and a brush biopsy was performed. He was found to have high grade Ta urothelial carcinoma in February 2016.    Oct 2007: TURBT, Low grade Ta  Jul 2013: TURBT (right bladder neck 1.5 cm tumor, low grade Ta),brush biopsy of distal left ureteral stricture (no malignancy)  Mar 2015: TURBT - Low grade Ta  Feb 2016: TURBT - High grade Ta, [facility]  Mar-Apr 2016: Induction BCG  Jul 2016: Maintenance BCG  Oct 2016: Multiple recurrent tumors noted on surveillance cystoscopy  Dec 2016: TUR - Multiple  tumors over right posterior bladder - High grade, Ta  Dec 2016 - Feb 2017: 6 week induction BCG  May 2017: TURBT - High grade, Ta urothelial carcinoma (two tumors)  Oct 2017: Completed induction 6 week course of intravesical gemcitabine [facility] - Dr. Ricky Ala)  Apr 2018: Two small recurrences (each < 1 cm)  Apr 2018: TURBT- High grade Ta, concern for possible lamina propria invasion for tumor at dome   2) Nephrolithiasis: He has a long standing history of calcium oxalate stones. He was incidentally noted to have a large partial staghorn calculus during retrograde pyelography in February 2015.  Apr 2015: L PCNL   3) Prostate cancer screening: We discontinued screening in 2014 due to his low risk for prostate cancer at that time and progressive dementia.  Last PSA: 0.35 (June 2014)   4) BPH/LUTS: He is treated with Avodart.     NON-GU PMH: Bacteriuria - 09/09/2016 Diabetes Type 2 Hypertension Unspecified dementia without behavioral disturbance    FAMILY HISTORY: No pertinent family history - Other Prostate Cancer - No Family History   SOCIAL HISTORY: Marital Status: Married Current Smoking Status: Patient has never smoked.  Has never drank.  Drinks 2 caffeinated drinks per day. Has not had a blood transfusion.     Notes: Never A Smoker   REVIEW OF SYSTEMS:    GU Review Male:   Patient denies frequent urination, hard to postpone urination, burning/ pain with urination, get up at night to urinate, leakage of urine, stream starts and stops, trouble starting your streams, and have to strain to urinate .  Gastrointestinal (Lower):   Patient denies diarrhea and constipation.  Gastrointestinal (Upper):   Patient denies nausea and vomiting.  Constitutional:   Patient denies fever, night sweats, weight loss, and fatigue.  Skin:   Patient denies skin rash/ lesion and itching.  Eyes:   Patient denies double vision and blurred vision.  Ears/ Nose/ Throat:   Patient denies sore throat and sinus  problems.  Hematologic/Lymphatic:   Patient denies swollen glands and easy bruising.  Cardiovascular:   Patient denies leg swelling and chest pains.  Respiratory:   Patient denies cough and shortness of breath.  Endocrine:   Patient denies excessive thirst.  Musculoskeletal:   Patient denies back pain and joint pain.  Neurological:   Patient denies headaches and dizziness.  Psychologic:   Patient denies depression and anxiety.   VITAL SIGNS:      10/14/2016 12:34 PM  Weight 206 lb / 93.44 kg  Height 71 in / 180.34 cm  BP 125/81 mmHg  Pulse 85 /min  BMI 28.7 kg/m   MULTI-SYSTEM PHYSICAL EXAMINATION:    Constitutional: Well-nourished. No physical deformities. Normally developed. Good grooming.  Respiratory: No labored breathing, no use of accessory muscles.   Cardiovascular: Normal temperature, normal extremity pulses, no swelling, no varicosities.     PAST DATA REVIEWED:  Source Of History:  Patient  Records Review:   Pathology Reports  Urine Test Review:   Urinalysis   11/23/12 11/18/11 11/28/10 01/08/10 12/20/03  PSA  Total PSA 0.35  0.42  0.46  0.49  2.42     PROCEDURES:          Urinalysis w/Scope Dipstick Dipstick Cont'd Micro  Color: Yellow Bilirubin: Neg WBC/hpf: 40 - 60/hpf  Appearance: Cloudy Ketones: Trace RBC/hpf: 0 - 2/hpf  Specific Gravity: 1.020 Blood: Trace Bacteria: Many (>50/hpf)  pH: <=5.0 Protein: Neg Cystals: NS (Not Seen)  Glucose: 3+ Urobilinogen: 0.2 Casts: NS (Not Seen)    Nitrites: Neg Trichomonas: Not Present    Leukocyte Esterase: 2+ Mucous: Not Present      Epithelial Cells: 0 - 5/hpf      Yeast: NS (Not Seen)      Sperm: Not Present    ASSESSMENT:      ICD-10 Details  2 GU:   Bladder Cancer overlapping sites - C67.8   1 NON-GU:   Bacteriuria - R82.71 Stable   PLAN:           Orders Labs Urine Culture          Schedule Return Visit/Planned Activity: Other See Visit Notes             Note: Will call to schedule surgery           Document Letter(s):  Created for Patient: Clinical Summary         Notes:   1. High risk not muscle invasive bladder cancer: We reviewed his pathology report today that confirms recurrent high-grade, Ta urothelial carcinoma at both sites of recurrence. The pathology from the tumor at the dome did suggest some suspicion for possible focal lamina propria invasion. We discussed this finding and will proceed with a repeat TUR to ensure that he is disease-free at this time.   I did speak with Dr. Ricky Ala today and we have agreed to consider repeat induction intravesical gemcitabine plus maintenance therapy. I spoke with Mr. Batty and his wife today and they do seem to be interested in this approach pending his repeat TUR pathology.   A urine culture has been sent and will be treated prior to his upcoming procedure.   Cc: Dr. Carla Drape  Dr. Gareth Eagle    * Signed by Raynelle Bring, M.D. on 10/14/16 at 6:13 PM (EDT)*

## 2016-10-16 NOTE — Progress Notes (Signed)
CXR- epic-09/25/16 HGBA1C-09/25/16-epic-8.5 EKG- epic- 09/25/16

## 2016-10-19 ENCOUNTER — Encounter (HOSPITAL_COMMUNITY): Payer: Self-pay | Admitting: *Deleted

## 2016-10-19 ENCOUNTER — Ambulatory Visit (HOSPITAL_COMMUNITY): Payer: Medicare Other | Admitting: Anesthesiology

## 2016-10-19 ENCOUNTER — Encounter (HOSPITAL_COMMUNITY)
Admission: RE | Disposition: A | Discharge status: Home / Self Care | Payer: Self-pay | Source: Ambulatory Visit | Attending: Urology

## 2016-10-19 ENCOUNTER — Ambulatory Visit (HOSPITAL_COMMUNITY)
Admission: RE | Admit: 2016-10-19 | Discharge: 2016-10-19 | Disposition: A | Discharge status: Home / Self Care | Payer: Medicare Other | Source: Ambulatory Visit | Attending: Urology | Admitting: Urology

## 2016-10-19 DIAGNOSIS — E119 Type 2 diabetes mellitus without complications: Secondary | ICD-10-CM | POA: Diagnosis not present

## 2016-10-19 DIAGNOSIS — C671 Malignant neoplasm of dome of bladder: Secondary | ICD-10-CM | POA: Diagnosis not present

## 2016-10-19 DIAGNOSIS — N401 Enlarged prostate with lower urinary tract symptoms: Secondary | ICD-10-CM | POA: Diagnosis not present

## 2016-10-19 DIAGNOSIS — R3916 Straining to void: Secondary | ICD-10-CM | POA: Diagnosis not present

## 2016-10-19 DIAGNOSIS — N200 Calculus of kidney: Secondary | ICD-10-CM | POA: Diagnosis not present

## 2016-10-19 DIAGNOSIS — R8271 Bacteriuria: Secondary | ICD-10-CM | POA: Insufficient documentation

## 2016-10-19 DIAGNOSIS — F0390 Unspecified dementia without behavioral disturbance: Secondary | ICD-10-CM | POA: Insufficient documentation

## 2016-10-19 DIAGNOSIS — C678 Malignant neoplasm of overlapping sites of bladder: Secondary | ICD-10-CM | POA: Insufficient documentation

## 2016-10-19 DIAGNOSIS — Z87442 Personal history of urinary calculi: Secondary | ICD-10-CM | POA: Insufficient documentation

## 2016-10-19 DIAGNOSIS — C679 Malignant neoplasm of bladder, unspecified: Secondary | ICD-10-CM | POA: Diagnosis not present

## 2016-10-19 DIAGNOSIS — R3915 Urgency of urination: Secondary | ICD-10-CM | POA: Diagnosis not present

## 2016-10-19 DIAGNOSIS — N3289 Other specified disorders of bladder: Secondary | ICD-10-CM | POA: Insufficient documentation

## 2016-10-19 DIAGNOSIS — Z79899 Other long term (current) drug therapy: Secondary | ICD-10-CM | POA: Insufficient documentation

## 2016-10-19 DIAGNOSIS — I10 Essential (primary) hypertension: Secondary | ICD-10-CM | POA: Diagnosis not present

## 2016-10-19 HISTORY — PX: TRANSURETHRAL RESECTION OF BLADDER TUMOR: SHX2575

## 2016-10-19 HISTORY — PX: CYSTOSCOPY: SHX5120

## 2016-10-19 HISTORY — DX: Personal history of antineoplastic chemotherapy: Z92.21

## 2016-10-19 LAB — GLUCOSE, CAPILLARY
GLUCOSE-CAPILLARY: 254 mg/dL — AB (ref 65–99)
Glucose-Capillary: 223 mg/dL — ABNORMAL HIGH (ref 65–99)
Glucose-Capillary: 240 mg/dL — ABNORMAL HIGH (ref 65–99)

## 2016-10-19 SURGERY — TURBT (TRANSURETHRAL RESECTION OF BLADDER TUMOR)
Anesthesia: General

## 2016-10-19 MED ORDER — FENTANYL CITRATE (PF) 100 MCG/2ML IJ SOLN: 25.0000 ug | INTRAMUSCULAR | Status: DC | PRN

## 2016-10-19 MED ORDER — ONDANSETRON HCL 4 MG/2ML IJ SOLN: 4.0000 mg | Freq: Once | INTRAMUSCULAR | Status: DC | PRN

## 2016-10-19 MED ORDER — OXYCODONE HCL 5 MG/5ML PO SOLN
5.0000 mg | Freq: Once | ORAL | Status: DC | PRN
  Filled 2016-10-19: qty 5

## 2016-10-19 MED ORDER — 0.9 % SODIUM CHLORIDE (POUR BTL) OPTIME
TOPICAL | Status: DC | PRN
  Administered 2016-10-19: 1000 mL

## 2016-10-19 MED ORDER — INSULIN ASPART 100 UNIT/ML ~~LOC~~ SOLN
5.0000 [IU] | Freq: Once | SUBCUTANEOUS | Status: AC
  Administered 2016-10-19: 5 [IU] via SUBCUTANEOUS

## 2016-10-19 MED ORDER — OXYCODONE HCL 5 MG PO TABS: 5.0000 mg | ORAL_TABLET | Freq: Once | ORAL | Status: DC | PRN

## 2016-10-19 MED ORDER — LACTATED RINGERS IV SOLN
INTRAVENOUS | Status: DC
  Administered 2016-10-19: 07:00:00 via INTRAVENOUS

## 2016-10-19 MED ORDER — LACTATED RINGERS IV SOLN
INTRAVENOUS | Status: DC
  Administered 2016-10-19: 11:00:00 via INTRAVENOUS

## 2016-10-19 MED ORDER — LIDOCAINE HCL 2 % EX GEL
CUTANEOUS | Status: AC
  Filled 2016-10-19: qty 5

## 2016-10-19 MED ORDER — HYDROMORPHONE HCL 1 MG/ML IJ SOLN: 0.2500 mg | INTRAMUSCULAR | Status: DC | PRN

## 2016-10-19 MED ORDER — MEPERIDINE HCL 50 MG/ML IJ SOLN: 6.2500 mg | INTRAMUSCULAR | Status: DC | PRN

## 2016-10-19 MED ORDER — PHENAZOPYRIDINE HCL 100 MG PO TABS: 100.0000 mg | ORAL_TABLET | Freq: Three times a day (TID) | ORAL | 0 refills | Status: DC | PRN

## 2016-10-19 MED ORDER — STERILE WATER FOR IRRIGATION IR SOLN
Status: DC | PRN
  Administered 2016-10-19: 3000 mL

## 2016-10-19 MED ORDER — DEXTROSE 5 % IV SOLN
2.0000 g | INTRAVENOUS | Status: AC
  Administered 2016-10-19: 2 g via INTRAVENOUS
  Filled 2016-10-19: qty 2

## 2016-10-19 MED ORDER — LIDOCAINE 2% (20 MG/ML) 5 ML SYRINGE
INTRAMUSCULAR | Status: DC | PRN
  Administered 2016-10-19: 100 mg via INTRAVENOUS

## 2016-10-19 MED ORDER — ONDANSETRON HCL 4 MG/2ML IJ SOLN
INTRAMUSCULAR | Status: AC
  Filled 2016-10-19: qty 2

## 2016-10-19 MED ORDER — PROPOFOL 10 MG/ML IV BOLUS
INTRAVENOUS | Status: AC
  Filled 2016-10-19: qty 20

## 2016-10-19 MED ORDER — LIDOCAINE 2% (20 MG/ML) 5 ML SYRINGE
INTRAMUSCULAR | Status: AC
  Filled 2016-10-19: qty 5

## 2016-10-19 MED ORDER — INSULIN ASPART 100 UNIT/ML ~~LOC~~ SOLN
SUBCUTANEOUS | Status: AC
  Filled 2016-10-19: qty 1

## 2016-10-19 MED ORDER — FENTANYL CITRATE (PF) 100 MCG/2ML IJ SOLN
INTRAMUSCULAR | Status: DC | PRN
  Administered 2016-10-19: 50 ug via INTRAVENOUS

## 2016-10-19 MED ORDER — PROMETHAZINE HCL 25 MG/ML IJ SOLN: 6.2500 mg | INTRAMUSCULAR | Status: DC | PRN

## 2016-10-19 MED ORDER — FENTANYL CITRATE (PF) 100 MCG/2ML IJ SOLN
INTRAMUSCULAR | Status: AC
  Filled 2016-10-19: qty 2

## 2016-10-19 MED ORDER — PROPOFOL 10 MG/ML IV BOLUS
INTRAVENOUS | Status: DC | PRN
  Administered 2016-10-19: 200 mg via INTRAVENOUS

## 2016-10-19 SURGICAL SUPPLY — 18 items
BAG URINE DRAINAGE (UROLOGICAL SUPPLIES) IMPLANT
BAG URO CATCHER STRL LF (MISCELLANEOUS) ×3 IMPLANT
CATH INTERMIT  6FR 70CM (CATHETERS) IMPLANT
CLOTH BEACON ORANGE TIMEOUT ST (SAFETY) ×3 IMPLANT
COVER SURGICAL LIGHT HANDLE (MISCELLANEOUS) ×3 IMPLANT
ELECT REM PT RETURN 15FT ADLT (MISCELLANEOUS) IMPLANT
GLOVE BIOGEL M STRL SZ7.5 (GLOVE) ×3 IMPLANT
GOWN STRL REUS W/TWL LRG LVL3 (GOWN DISPOSABLE) ×6 IMPLANT
GUIDEWIRE STR DUAL SENSOR (WIRE) IMPLANT
LOOP CUT BIPOLAR 24F LRG (ELECTROSURGICAL) IMPLANT
MANIFOLD NEPTUNE II (INSTRUMENTS) ×3 IMPLANT
NDL SAFETY ECLIPSE 18X1.5 (NEEDLE) ×1 IMPLANT
NEEDLE HYPO 18GX1.5 SHARP (NEEDLE) ×3
PACK CYSTO (CUSTOM PROCEDURE TRAY) ×3 IMPLANT
SET ASPIRATION TUBING (TUBING) IMPLANT
SYRINGE IRR TOOMEY STRL 70CC (SYRINGE) IMPLANT
TUBING CONNECTING 10 (TUBING) ×2 IMPLANT
TUBING CONNECTING 10' (TUBING) ×1

## 2016-10-19 NOTE — Interval H&P Note (Signed)
History and Physical Interval Note:  10/19/2016 8:34 AM  [name] [name]  has presented today for surgery, with the diagnosis of BLADDER CANCER  The various methods of treatment have been discussed with the patient and family. After consideration of risks, benefits and other options for treatment, the patient has consented to  Procedure(s) with comments: TRANSURETHRAL RESECTION OF BLADDER TUMOR (TURBT) (N/A) - NEEDS 30 MIN TOTAL FOR BOTH PROCEDURES CYSTOSCOPY (N/A) as a surgical intervention .  The patient's history has been reviewed, patient examined, no change in status, stable for surgery.  I have reviewed the patient's chart and labs.  Questions were answered to the patient's satisfaction.     Stanton Kissoon,LES

## 2016-10-19 NOTE — Anesthesia Preprocedure Evaluation (Addendum)

## 2016-10-19 NOTE — Anesthesia Postprocedure Evaluation (Signed)
Anesthesia Post Note  Patient: [name] [name]  Procedure(s) Performed: Procedure(s) (LRB): TRANSURETHRAL RESECTION OF BLADDER TUMOR (TURBT) (N/A) CYSTOSCOPY (N/A)  Patient location during evaluation: PACU Anesthesia Type: General Level of consciousness: awake, awake and alert and oriented Pain management: pain level controlled Vital Signs Assessment: post-procedure vital signs reviewed and stable Respiratory status: spontaneous breathing, nonlabored ventilation and respiratory function stable Cardiovascular status: blood pressure returned to baseline Anesthetic complications: no       Last Vitals:  Vitals:   10/19/16 1013 10/19/16 1044  BP: 134/83 136/84  Pulse: 77 78  Resp: 18 16  Temp: 36.9 C 36.7 C    Last Pain:  Vitals:   10/19/16 1001  PainSc: 0-No pain                 Emilyanne Mcgough COKER

## 2016-10-19 NOTE — Anesthesia Postprocedure Evaluation (Addendum)
Anesthesia Post Note  Patient: [name] [name]  Procedure(s) Performed: Procedure(s) (LRB): TRANSURETHRAL RESECTION OF BLADDER TUMOR (TURBT) (N/A) CYSTOSCOPY (N/A)  Anesthesia Type: General       Last Vitals:  Vitals:   10/19/16 1013 10/19/16 1044  BP: 134/83 136/84  Pulse: 77 78  Resp: 18 16  Temp: 36.9 C 36.7 C    Last Pain:  Vitals:   10/19/16 1001  PainSc: 0-No pain                 Mitsuye Schrodt COKER

## 2016-10-19 NOTE — Op Note (Signed)
    Preoperative diagnosis: Bladder cancer  Postoperative diagnosis: Bladder cancer  Procedure: 1.  Cystoscopy 2.  Transurethral resection/biopsy of bladder  Surgeon: Pryor Curia. M.D.  Anesthesia: General  Complications: None  EBL: Minimal  Specimen: Deep biopsies of previous resection site at dome of bladder  Disposition of specimen: Pathology  Indication: [name] [name] is a 76 year old gentleman with recurrent BCG refractory high risk non-muscle invasive bladder cancer.  He has not been felt to be a cystectomy candidate due to progressive dementia.  He recently had a recurrence following therapy with intravesical gemcitabine.  His tumors revealed recurrent high-grade, Ta tumors.  However, there was a question of superficial lamina propria invasion of the bladder dome tumor.  He presents today for repeat resection of the bladder dome tumor site.  The potential risks, complications, and alternative options have been discussed in detail and informed consent obtained.  Description of procedure:  The patient was taken to the operating room and a general anesthetic was administered.  He was given preoperative antibiotics, placed in the dorsal lithotomy position, and prepped and draped in the usual sterile fashion.  Next, a preoperative timeout was performed.  Cystourethroscopy was then performed with a 30 and 70 lens.  This revealed 2 previously noted resection sites that demonstrated areas of necrosis as expected.  No identifiable remaining viable tumor was visualized.  Attention then turned to the dome tumor site.  A cold cup biopsy forceps was then used to take multiple biopsies of the underlying tissue as well as some of the surrounding tissue near the edge of the resection site.  Care was taken not to perforate the bladder considering the location at the bladder dome.  A Bugbee electrode was used to provide hemostasis.  The bladder was emptied and reinspected and there appeared to  be good hemostasis.  The patient tolerated the procedure well without complications.  He was able to be awakened and transferred to the recovery unit in satisfactory condition.

## 2016-10-19 NOTE — Anesthesia Procedure Notes (Signed)
Procedure Name: LMA Insertion Date/Time: 10/19/2016 8:50 AM Performed by: Danley Danker L Patient Re-evaluated:Patient Re-evaluated prior to inductionOxygen Delivery Method: Circle system utilized Preoxygenation: Pre-oxygenation with 100% oxygen Intubation Type: IV induction Ventilation: Mask ventilation without difficulty LMA: LMA inserted LMA Size: 4.0 Number of attempts: 1 Placement Confirmation: positive ETCO2 and breath sounds checked- equal and bilateral Tube secured with: Tape

## 2016-10-19 NOTE — Discharge Instructions (Addendum)
General Anesthesia, Adult, Care After These instructions provide you with information about caring for yourself after your procedure. Your health care provider may also give you more specific instructions. Your treatment has been planned according to current medical practices, but problems sometimes occur. Call your health care provider if you have any problems or questions after your procedure. What can I expect after the procedure? After the procedure, it is common to have: Vomiting. A sore throat. Mental slowness. It is common to feel: Nauseous. Cold or shivery. Sleepy. Tired. Sore or achy, even in parts of your body where you did not have surgery. Follow these instructions at home: For at least 24 hours after the procedure:  Do not: Participate in activities where you could fall or become injured. Drive. Use heavy machinery. Drink alcohol. Take sleeping pills or medicines that cause drowsiness. Make important decisions or sign legal documents. Take care of children on your own. Rest. Eating and drinking  If you vomit, drink water, juice, or soup when you can drink without vomiting. Drink enough fluid to keep your urine clear or pale yellow. Make sure you have little or no nausea before eating solid foods. Follow the diet recommended by your health care provider. General instructions  Have a responsible adult stay with you until you are awake and alert. Return to your normal activities as told by your health care provider. Ask your health care provider what activities are safe for you. Take over-the-counter and prescription medicines only as told by your health care provider. If you smoke, do not smoke without supervision. Keep all follow-up visits as told by your health care provider. This is important. Contact a health care provider if: You continue to have nausea or vomiting at home, and medicines are not helpful. You cannot drink fluids or start eating again. You cannot  urinate after 8-12 hours. You develop a skin rash. You have fever. You have increasing redness at the site of your procedure. Get help right away if: You have difficulty breathing. You have chest pain. You have unexpected bleeding. You feel that you are having a life-threatening or urgent problem. This information is not intended to replace advice given to you by your health care provider. Make sure you discuss any questions you have with your health care provider. Document Released: 09/07/2000 Document Revised: 11/04/2015 Document Reviewed: 05/16/2015 Elsevier Interactive Patient Education  2017 [place] may see some blood in the urine and may have some burning with urination for 48-72 hours. You also may notice that you have to urinate more frequently or urgently after your procedure which is normal.  2. You should call should you develop an inability urinate, fever > 101, persistent nausea and vomiting that prevents you from eating or drinking to stay hydrated.

## 2016-10-19 NOTE — Transfer of Care (Signed)
Immediate Anesthesia Transfer of Care Note  Patient: [name] [name]  Procedure(s) Performed: Procedure(s) with comments: TRANSURETHRAL RESECTION OF BLADDER TUMOR (TURBT) (N/A) - NEEDS 30 MIN TOTAL FOR BOTH PROCEDURES CYSTOSCOPY (N/A)  Patient Location: PACU  Anesthesia Type:General  Level of Consciousness: awake  Airway & Oxygen Therapy: Patient Spontanous Breathing and Patient connected to face mask oxygen  Post-op Assessment: Report given to RN and Post -op Vital signs reviewed and stable  Post vital signs: Reviewed and stable  Last Vitals:  Vitals:   10/19/16 0652  BP: (!) 153/73  Pulse: 81  Resp: 16  Temp: 36.7 C    Last Pain:  Vitals:   10/19/16 0652  PainSc: 0-No pain         Complications: No apparent anesthesia complications

## 2016-10-20 ENCOUNTER — Encounter (HOSPITAL_COMMUNITY): Payer: Self-pay | Admitting: Urology

## 2016-10-28 DIAGNOSIS — C678 Malignant neoplasm of overlapping sites of bladder: Secondary | ICD-10-CM | POA: Diagnosis not present

## 2016-10-28 DIAGNOSIS — R8299 Other abnormal findings in urine: Secondary | ICD-10-CM | POA: Diagnosis not present

## 2016-11-02 DIAGNOSIS — C679 Malignant neoplasm of bladder, unspecified: Secondary | ICD-10-CM | POA: Diagnosis not present

## 2016-11-06 DIAGNOSIS — I10 Essential (primary) hypertension: Secondary | ICD-10-CM | POA: Diagnosis not present

## 2016-11-06 DIAGNOSIS — Z125 Encounter for screening for malignant neoplasm of prostate: Secondary | ICD-10-CM | POA: Diagnosis not present

## 2016-11-06 DIAGNOSIS — N390 Urinary tract infection, site not specified: Secondary | ICD-10-CM | POA: Diagnosis not present

## 2016-11-06 DIAGNOSIS — E789 Disorder of lipoprotein metabolism, unspecified: Secondary | ICD-10-CM | POA: Diagnosis not present

## 2016-11-06 DIAGNOSIS — E118 Type 2 diabetes mellitus with unspecified complications: Secondary | ICD-10-CM | POA: Diagnosis not present

## 2016-11-12 DIAGNOSIS — E118 Type 2 diabetes mellitus with unspecified complications: Secondary | ICD-10-CM | POA: Diagnosis not present

## 2016-11-12 DIAGNOSIS — E789 Disorder of lipoprotein metabolism, unspecified: Secondary | ICD-10-CM | POA: Diagnosis not present

## 2016-11-13 NOTE — Addendum Note (Signed)
Addendum  created 11/13/16 1335 by Effie Berkshire, MD   Sign clinical note

## 2016-11-30 DIAGNOSIS — Z6831 Body mass index (BMI) 31.0-31.9, adult: Secondary | ICD-10-CM | POA: Diagnosis not present

## 2016-11-30 DIAGNOSIS — C678 Malignant neoplasm of overlapping sites of bladder: Secondary | ICD-10-CM | POA: Diagnosis not present

## 2016-12-01 DIAGNOSIS — C679 Malignant neoplasm of bladder, unspecified: Secondary | ICD-10-CM | POA: Diagnosis not present

## 2016-12-28 DIAGNOSIS — C679 Malignant neoplasm of bladder, unspecified: Secondary | ICD-10-CM | POA: Diagnosis not present

## 2016-12-31 DIAGNOSIS — L570 Actinic keratosis: Secondary | ICD-10-CM | POA: Diagnosis not present

## 2016-12-31 DIAGNOSIS — Z85828 Personal history of other malignant neoplasm of skin: Secondary | ICD-10-CM | POA: Diagnosis not present

## 2016-12-31 DIAGNOSIS — L578 Other skin changes due to chronic exposure to nonionizing radiation: Secondary | ICD-10-CM | POA: Diagnosis not present

## 2017-01-04 DIAGNOSIS — C679 Malignant neoplasm of bladder, unspecified: Secondary | ICD-10-CM | POA: Diagnosis not present

## 2017-01-11 DIAGNOSIS — C679 Malignant neoplasm of bladder, unspecified: Secondary | ICD-10-CM | POA: Diagnosis not present

## 2017-01-18 DIAGNOSIS — C679 Malignant neoplasm of bladder, unspecified: Secondary | ICD-10-CM | POA: Diagnosis not present

## 2017-01-25 DIAGNOSIS — C679 Malignant neoplasm of bladder, unspecified: Secondary | ICD-10-CM | POA: Diagnosis not present

## 2017-02-01 DIAGNOSIS — C679 Malignant neoplasm of bladder, unspecified: Secondary | ICD-10-CM | POA: Diagnosis not present

## 2017-02-04 NOTE — Addendum Note (Signed)
Addendum  created 02/04/17 1408 by Roberts Gaudy, MD   Sign clinical note

## 2017-02-16 DIAGNOSIS — F0390 Unspecified dementia without behavioral disturbance: Secondary | ICD-10-CM | POA: Diagnosis not present

## 2017-02-16 DIAGNOSIS — E1165 Type 2 diabetes mellitus with hyperglycemia: Secondary | ICD-10-CM | POA: Diagnosis not present

## 2017-02-16 DIAGNOSIS — I10 Essential (primary) hypertension: Secondary | ICD-10-CM | POA: Diagnosis not present

## 2017-02-16 DIAGNOSIS — E782 Mixed hyperlipidemia: Secondary | ICD-10-CM | POA: Diagnosis not present

## 2017-02-19 DIAGNOSIS — Z23 Encounter for immunization: Secondary | ICD-10-CM | POA: Diagnosis not present

## 2017-02-19 DIAGNOSIS — C678 Malignant neoplasm of overlapping sites of bladder: Secondary | ICD-10-CM | POA: Diagnosis not present

## 2017-02-19 DIAGNOSIS — R8271 Bacteriuria: Secondary | ICD-10-CM | POA: Diagnosis not present

## 2017-03-23 DIAGNOSIS — E1165 Type 2 diabetes mellitus with hyperglycemia: Secondary | ICD-10-CM | POA: Diagnosis not present

## 2017-03-23 DIAGNOSIS — Z0000 Encounter for general adult medical examination without abnormal findings: Secondary | ICD-10-CM | POA: Diagnosis not present

## 2017-03-23 DIAGNOSIS — I10 Essential (primary) hypertension: Secondary | ICD-10-CM | POA: Diagnosis not present

## 2017-03-23 DIAGNOSIS — E782 Mixed hyperlipidemia: Secondary | ICD-10-CM | POA: Diagnosis not present

## 2017-03-29 DIAGNOSIS — L578 Other skin changes due to chronic exposure to nonionizing radiation: Secondary | ICD-10-CM | POA: Diagnosis not present

## 2017-03-30 DIAGNOSIS — E782 Mixed hyperlipidemia: Secondary | ICD-10-CM | POA: Diagnosis not present

## 2017-03-30 DIAGNOSIS — I10 Essential (primary) hypertension: Secondary | ICD-10-CM | POA: Diagnosis not present

## 2017-03-30 DIAGNOSIS — R002 Palpitations: Secondary | ICD-10-CM | POA: Diagnosis not present

## 2017-03-30 DIAGNOSIS — F0390 Unspecified dementia without behavioral disturbance: Secondary | ICD-10-CM | POA: Diagnosis not present

## 2017-03-30 DIAGNOSIS — E1165 Type 2 diabetes mellitus with hyperglycemia: Secondary | ICD-10-CM | POA: Diagnosis not present

## 2017-05-17 DIAGNOSIS — D1801 Hemangioma of skin and subcutaneous tissue: Secondary | ICD-10-CM | POA: Diagnosis not present

## 2017-05-17 DIAGNOSIS — C4442 Squamous cell carcinoma of skin of scalp and neck: Secondary | ICD-10-CM | POA: Diagnosis not present

## 2017-05-17 DIAGNOSIS — L821 Other seborrheic keratosis: Secondary | ICD-10-CM | POA: Diagnosis not present

## 2017-05-17 DIAGNOSIS — L308 Other specified dermatitis: Secondary | ICD-10-CM | POA: Diagnosis not present

## 2017-05-17 DIAGNOSIS — B353 Tinea pedis: Secondary | ICD-10-CM | POA: Diagnosis not present

## 2017-05-17 DIAGNOSIS — L570 Actinic keratosis: Secondary | ICD-10-CM | POA: Diagnosis not present

## 2017-05-17 DIAGNOSIS — C44519 Basal cell carcinoma of skin of other part of trunk: Secondary | ICD-10-CM | POA: Diagnosis not present

## 2017-05-17 DIAGNOSIS — D485 Neoplasm of uncertain behavior of skin: Secondary | ICD-10-CM | POA: Diagnosis not present

## 2017-05-19 DIAGNOSIS — Z961 Presence of intraocular lens: Secondary | ICD-10-CM | POA: Diagnosis not present

## 2017-05-19 DIAGNOSIS — E113291 Type 2 diabetes mellitus with mild nonproliferative diabetic retinopathy without macular edema, right eye: Secondary | ICD-10-CM | POA: Diagnosis not present

## 2017-05-19 DIAGNOSIS — H524 Presbyopia: Secondary | ICD-10-CM | POA: Diagnosis not present

## 2017-05-21 DIAGNOSIS — C678 Malignant neoplasm of overlapping sites of bladder: Secondary | ICD-10-CM | POA: Diagnosis not present

## 2017-07-19 DIAGNOSIS — C679 Malignant neoplasm of bladder, unspecified: Secondary | ICD-10-CM | POA: Diagnosis not present

## 2017-07-27 DIAGNOSIS — L570 Actinic keratosis: Secondary | ICD-10-CM | POA: Diagnosis not present

## 2017-07-27 DIAGNOSIS — D485 Neoplasm of uncertain behavior of skin: Secondary | ICD-10-CM | POA: Diagnosis not present

## 2017-07-27 DIAGNOSIS — C44622 Squamous cell carcinoma of skin of right upper limb, including shoulder: Secondary | ICD-10-CM | POA: Diagnosis not present

## 2017-07-27 DIAGNOSIS — Z85828 Personal history of other malignant neoplasm of skin: Secondary | ICD-10-CM | POA: Diagnosis not present

## 2017-08-16 DIAGNOSIS — C679 Malignant neoplasm of bladder, unspecified: Secondary | ICD-10-CM | POA: Diagnosis not present

## 2017-08-16 DIAGNOSIS — Z79899 Other long term (current) drug therapy: Secondary | ICD-10-CM | POA: Diagnosis not present

## 2017-08-16 DIAGNOSIS — Z5111 Encounter for antineoplastic chemotherapy: Secondary | ICD-10-CM | POA: Diagnosis not present

## 2017-08-17 DIAGNOSIS — E1165 Type 2 diabetes mellitus with hyperglycemia: Secondary | ICD-10-CM | POA: Diagnosis not present

## 2017-08-17 DIAGNOSIS — E782 Mixed hyperlipidemia: Secondary | ICD-10-CM | POA: Diagnosis not present

## 2017-08-17 DIAGNOSIS — I10 Essential (primary) hypertension: Secondary | ICD-10-CM | POA: Diagnosis not present

## 2017-08-24 DIAGNOSIS — E118 Type 2 diabetes mellitus with unspecified complications: Secondary | ICD-10-CM | POA: Diagnosis not present

## 2017-08-24 DIAGNOSIS — E782 Mixed hyperlipidemia: Secondary | ICD-10-CM | POA: Diagnosis not present

## 2017-08-24 DIAGNOSIS — E1165 Type 2 diabetes mellitus with hyperglycemia: Secondary | ICD-10-CM | POA: Diagnosis not present

## 2017-08-24 DIAGNOSIS — F0390 Unspecified dementia without behavioral disturbance: Secondary | ICD-10-CM | POA: Diagnosis not present

## 2017-08-24 DIAGNOSIS — I10 Essential (primary) hypertension: Secondary | ICD-10-CM | POA: Diagnosis not present

## 2017-09-13 DIAGNOSIS — C679 Malignant neoplasm of bladder, unspecified: Secondary | ICD-10-CM | POA: Diagnosis not present

## 2017-09-24 DIAGNOSIS — C678 Malignant neoplasm of overlapping sites of bladder: Secondary | ICD-10-CM | POA: Diagnosis not present

## 2017-10-04 ENCOUNTER — Other Ambulatory Visit: Payer: Self-pay | Admitting: Urology

## 2017-10-10 DIAGNOSIS — Z1211 Encounter for screening for malignant neoplasm of colon: Secondary | ICD-10-CM | POA: Diagnosis not present

## 2017-10-10 DIAGNOSIS — Z1212 Encounter for screening for malignant neoplasm of rectum: Secondary | ICD-10-CM | POA: Diagnosis not present

## 2017-10-25 DIAGNOSIS — L570 Actinic keratosis: Secondary | ICD-10-CM | POA: Diagnosis not present

## 2017-10-25 DIAGNOSIS — C44329 Squamous cell carcinoma of skin of other parts of face: Secondary | ICD-10-CM | POA: Diagnosis not present

## 2017-10-25 DIAGNOSIS — Z85828 Personal history of other malignant neoplasm of skin: Secondary | ICD-10-CM | POA: Diagnosis not present

## 2017-10-25 DIAGNOSIS — D485 Neoplasm of uncertain behavior of skin: Secondary | ICD-10-CM | POA: Diagnosis not present

## 2017-10-27 ENCOUNTER — Other Ambulatory Visit: Payer: Self-pay | Admitting: Urology

## 2017-11-01 DIAGNOSIS — C679 Malignant neoplasm of bladder, unspecified: Secondary | ICD-10-CM | POA: Diagnosis not present

## 2017-11-01 DIAGNOSIS — Z5111 Encounter for antineoplastic chemotherapy: Secondary | ICD-10-CM | POA: Diagnosis not present

## 2017-11-01 NOTE — Patient Instructions (Addendum)
[name] [name]  11/01/2017   Your procedure is scheduled on: Thursday 11/11/2017  Report to [facility] Main  Entrance              Report to admitting at  1100 AM    Call this number if you have problems the morning of surgery [phone]    Remember: Do not eat food:After Midnight.  May have clear liquids from midnight up until 0700 am then nothing until after surgery!    CLEAR LIQUID DIET   Foods Allowed                                                                     Foods Excluded  Coffee and tea, regular and decaf                             liquids that you cannot  Plain Jell-O in any flavor                                             see through such as: Fruit ices (not with fruit pulp)                                     milk, soups, orange juice  Iced Popsicles                                    All solid food Carbonated beverages, regular and diet                                    Cranberry, grape and apple juices Sports drinks like Gatorade Lightly seasoned clear broth or consume(fat free) Sugar, honey syrup  Sample Menu Breakfast                                Lunch                                     Supper Cranberry juice                    Beef broth                            Chicken broth Jell-O                                     Grape juice  Apple juice Coffee or tea                        Jell-O                                      Popsicle                                                Coffee or tea                        Coffee or tea  _____________________________________________________________________  How to Manage Your Diabetes Before and After Surgery  Why is it important to control my blood sugar before and after surgery? . Improving blood sugar levels before and after surgery helps healing and can limit problems. . A way of improving blood sugar control is eating a healthy diet by: o   Eating less sugar and carbohydrates o  Increasing activity/exercise o  Talking with your doctor about reaching your blood sugar goals . High blood sugars (greater than 180 mg/dL) can raise your risk of infections and slow your recovery, so you will need to focus on controlling your diabetes during the weeks before surgery. . Make sure that the doctor who takes care of your diabetes knows about your planned surgery including the date and location.  How do I manage my blood sugar before surgery? . Check your blood sugar at least 4 times a day, starting 2 days before surgery, to make sure that the level is not too high or low. o Check your blood sugar the morning of your surgery when you wake up and every 2 hours until you get to the Short Stay unit. . If your blood sugar is less than 70 mg/dL, you will need to treat for low blood sugar: o Do not take insulin. o Treat a low blood sugar (less than 70 mg/dL) with  cup of clear juice (cranberry or apple), 4 glucose tablets, OR glucose gel. o Recheck blood sugar in 15 minutes after treatment (to make sure it is greater than 70 mg/dL). If your blood sugar is not greater than 70 mg/dL on recheck, call [phone] for further instructions. . Report your blood sugar to the short stay nurse when you get to Short Stay.  . If you are admitted to the hospital after surgery: o Your blood sugar will be checked by the staff and you will probably be given insulin after surgery (instead of oral diabetes medicines) to make sure you have good blood sugar levels. o The goal for blood sugar control after surgery is 80-180 mg/dL.   WHAT DO I DO ABOUT MY DIABETES MEDICATION?        The day before surgery, take the morning dose of Glipizide ( Glucotrol)        The day before surgery, take Glucophage (Metformin) as usual.        The day before surgery, take Saxagliptin Lance Muss) as usual.   . Do not take oral diabetes medicines (pills) the morning of  surgery.    Take these medicines the morning of surgery with A SIP OF WATER: Memantine (Namenda), Metoprolol  Succinate (Toprol-XL)     DO NOT  TAKE ANY DIABETIC MEDICATIONS DAY OF YOUR SURGERY!                               You may not have any metal on your body including hair pins and              piercings  Do not wear jewelry, lotions, powders or perfumes, deodorant                        Men may shave face and neck.   Do not bring valuables to the hospital. [place].  Contacts, dentures or bridgework may not be worn into surgery.  Leave suitcase in the car. After surgery it may be brought to your room.     Patients discharged the day of surgery will not be allowed to drive home.  Name and phone number of your driver:spouse- [name] [name]                Please read over the following fact sheets you were given: _____________________________________________________________________             [facility] - Preparing for Surgery Before surgery, you can play an important role.  Because skin is not sterile, your skin needs to be as free of germs as possible.  You can reduce the number of germs on your skin by washing with CHG (chlorahexidine gluconate) soap before surgery.  CHG is an antiseptic cleaner which kills germs and bonds with the skin to continue killing germs even after washing. Please DO NOT use if you have an allergy to CHG or antibacterial soaps.  If your skin becomes reddened/irritated stop using the CHG and inform your nurse when you arrive at Short Stay. Do not shave (including legs and underarms) for at least 48 hours prior to the first CHG shower.  You may shave your face/neck. Please follow these instructions carefully:  1.  Shower with CHG Soap the night before surgery and the  morning of Surgery.  2.  If you choose to wash your hair, wash your hair first as usual with your  normal  shampoo.  3.  After you shampoo,  rinse your hair and body thoroughly to remove the  shampoo.                           4.  Use CHG as you would any other liquid soap.  You can apply chg directly  to the skin and wash                       Gently with a scrungie or clean washcloth.  5.  Apply the CHG Soap to your body ONLY FROM THE NECK DOWN.   Do not use on face/ open                           Wound or open sores. Avoid contact with eyes, ears mouth and genitals (private parts).                       Wash face,  Genitals (private parts) with your normal soap.  6.  Wash thoroughly, paying special attention to the area where your surgery  will be performed.  7.  Thoroughly rinse your body with warm water from the neck down.  8.  DO NOT shower/wash with your normal soap after using and rinsing off  the CHG Soap.                9.  Pat yourself dry with a clean towel.            10.  Wear clean pajamas.            11.  Place clean sheets on your bed the night of your first shower and do not  sleep with pets. Day of Surgery : Do not apply any lotions/deodorants the morning of surgery.  Please wear clean clothes to the hospital/surgery center.  FAILURE TO FOLLOW THESE INSTRUCTIONS MAY RESULT IN THE CANCELLATION OF YOUR SURGERY PATIENT SIGNATURE_________________________________  NURSE SIGNATURE__________________________________  ________________________________________________________________________ [facility] - Preparing for Surgery Before surgery, you can play an important role.  Because skin is not sterile, your skin needs to be as free of germs as possible.  You can reduce the number of germs on your skin by washing with CHG (chlorahexidine gluconate) soap before surgery.  CHG is an antiseptic cleaner which kills germs and bonds with the skin to continue killing germs even after washing. Please DO NOT use if you have an allergy to CHG or antibacterial soaps.  If your skin becomes reddened/irritated stop using the CHG  and inform your nurse when you arrive at Short Stay. Do not shave (including legs and underarms) for at least 48 hours prior to the first CHG shower.  You may shave your face/neck. Please follow these instructions carefully:  1.  Shower with CHG Soap the night before surgery and the  morning of Surgery.  2.  If you choose to wash your hair, wash your hair first as usual with your  normal  shampoo.  3.  After you shampoo, rinse your hair and body thoroughly to remove the  shampoo.                           4.  Use CHG as you would any other liquid soap.  You can apply chg directly  to the skin and wash                       Gently with a scrungie or clean washcloth.  5.  Apply the CHG Soap to your body ONLY FROM THE NECK DOWN.   Do not use on face/ open                           Wound or open sores. Avoid contact with eyes, ears mouth and genitals (private parts).                       Wash face,  Genitals (private parts) with your normal soap.             6.  Wash thoroughly, paying special attention to the area where your surgery  will be performed.  7.  Thoroughly rinse your body with warm water from the neck down.  8.  DO NOT shower/wash with your normal soap after using and rinsing off  the CHG Soap.  9.  Pat yourself dry with a clean towel.            10.  Wear clean pajamas.            11.  Place clean sheets on your bed the night of your first shower and do not  sleep with pets. Day of Surgery : Do not apply any lotions/deodorants the morning of surgery.  Please wear clean clothes to the hospital/surgery center.  FAILURE TO FOLLOW THESE INSTRUCTIONS MAY RESULT IN THE CANCELLATION OF YOUR SURGERY PATIENT SIGNATURE_________________________________  NURSE SIGNATURE__________________________________  ________________________________________________________________________

## 2017-11-01 NOTE — Progress Notes (Signed)
08/24/2017- On chart office note from Dr. Ashby Dawes.  08/17/2017- On chart from [place], HgA1C, Lipid  03/30/2017- on chart office note from Dr. Ashby Dawes and EKG.

## 2017-11-09 ENCOUNTER — Other Ambulatory Visit: Payer: Self-pay

## 2017-11-09 ENCOUNTER — Encounter (HOSPITAL_COMMUNITY): Payer: Self-pay

## 2017-11-09 ENCOUNTER — Encounter (HOSPITAL_COMMUNITY)
Admission: RE | Admit: 2017-11-09 | Discharge: 2017-11-09 | Disposition: A | Discharge status: Home / Self Care | Payer: Medicare Other | Source: Ambulatory Visit | Attending: Urology | Admitting: Urology

## 2017-11-09 DIAGNOSIS — G309 Alzheimer's disease, unspecified: Secondary | ICD-10-CM | POA: Diagnosis not present

## 2017-11-09 DIAGNOSIS — E785 Hyperlipidemia, unspecified: Secondary | ICD-10-CM | POA: Diagnosis not present

## 2017-11-09 DIAGNOSIS — Z7984 Long term (current) use of oral hypoglycemic drugs: Secondary | ICD-10-CM | POA: Diagnosis not present

## 2017-11-09 DIAGNOSIS — I10 Essential (primary) hypertension: Secondary | ICD-10-CM | POA: Diagnosis not present

## 2017-11-09 DIAGNOSIS — C674 Malignant neoplasm of posterior wall of bladder: Secondary | ICD-10-CM | POA: Diagnosis not present

## 2017-11-09 DIAGNOSIS — E119 Type 2 diabetes mellitus without complications: Secondary | ICD-10-CM | POA: Diagnosis not present

## 2017-11-09 DIAGNOSIS — F0280 Dementia in other diseases classified elsewhere without behavioral disturbance: Secondary | ICD-10-CM | POA: Diagnosis not present

## 2017-11-09 DIAGNOSIS — Z79899 Other long term (current) drug therapy: Secondary | ICD-10-CM | POA: Diagnosis not present

## 2017-11-09 HISTORY — DX: Alzheimer's disease, unspecified: G30.9

## 2017-11-09 HISTORY — DX: Dementia in other diseases classified elsewhere, unspecified severity, without behavioral disturbance, psychotic disturbance, mood disturbance, and anxiety: F02.80

## 2017-11-09 LAB — BASIC METABOLIC PANEL WITH GFR
Anion gap: 11 (ref 5–15)
BUN: 23 mg/dL — ABNORMAL HIGH (ref 6–20)
CO2: 27 mmol/L (ref 22–32)
Calcium: 9.6 mg/dL (ref 8.9–10.3)
Chloride: 104 mmol/L (ref 101–111)
Creatinine, Ser: 1 mg/dL (ref 0.61–1.24)
GFR calc Af Amer: >60 mL/min (ref >60)
GFR calc non Af Amer: >60 mL/min (ref >60)
Glucose, Bld: 230 mg/dL — ABNORMAL HIGH (ref 65–99)
Potassium: 3.9 mmol/L (ref 3.5–5.1)
Sodium: 142 mmol/L (ref 135–145)

## 2017-11-09 LAB — CBC
HCT: 42.5 % (ref 39.0–52.0)
Hemoglobin: 14.3 g/dL (ref 13.0–17.0)
MCH: 28.7 pg (ref 26.0–34.0)
MCHC: 33.6 g/dL (ref 30.0–36.0)
MCV: 85.3 fL (ref 78.0–100.0)
Platelets: 246 K/uL (ref 150–400)
RBC: 4.98 MIL/uL (ref 4.22–5.81)
RDW: 12.6 % (ref 11.5–15.5)
WBC: 9.3 K/uL (ref 4.0–10.5)

## 2017-11-09 LAB — HEMOGLOBIN A1C
HEMOGLOBIN A1C: 6.7 % — AB (ref 4.8–5.6)
MEAN PLASMA GLUCOSE: 145.59 mg/dL

## 2017-11-09 LAB — GLUCOSE, CAPILLARY: GLUCOSE-CAPILLARY: 264 mg/dL — AB (ref 65–99)

## 2017-11-10 NOTE — H&P (Signed)
CC/HPI: Bladder cancer   [name] [name] returns today for surveillance cystoscopy due to his history of BCG refractory high-grade, urothelial carcinoma of the bladder. He has proceed with monthly intravesical gemcitabine under the care of Dr. Ricky Ala at [facility]. He has tolerated this quite well according to his wife. His dementia remains relatively stable. He has not had any recent hematuria. His last treatment was April 1st.     ALLERGIES: Aricept Nitrofurantoin Monohyd Macro CAPS Sulfa Drugs Tetracyclines    MEDICATIONS: Metoprolol Succinate 25 mg tablet, extended release 24 hr  Avodart 0.5 mg capsule 0 Oral  Fish Oil CAPS Oral  Glipizide 5 mg tablet  Hydrochlorothiazide 25 mg tablet Oral  MetFORMIN HCl - 500 MG Oral Tablet Oral  Namenda Xr 28 mg capsule sprinkle, extended release 24 hr Oral  Onglyza 5 mg tablet Oral  Perindopril Erbumine 4 MG Oral Tablet Oral  Vitamin B-12 100 mcg tablet Oral     GU PSH: Bladder Instill AntiCA Agent - 2016 Cysto Bladder Ureth Biopsy - 10/19/2016 Cysto Uretero Lithotripsy - 2010 Cystoscopy - 05/21/2017, 02/19/2017, 09/14/2016 Cystoscopy Fulguration - 2010 Cystoscopy Insert Stent - 2015, 2010, 2010, 2008 Cystoscopy TURBT <2 cm - 09/28/2016, 03/02/2016, 2015, 2013 Cystoscopy TURBT 2-5 cm - 11/18/2015, 05/21/2015, 2016 Cystoscopy Ureteroscopy, Left - 09/28/2016, 2015 Percut Stone Removal >2cm - 2015 Ureteroscopic stone removal - 2008      PSH Notes: Cystoscopy With Fulguration Medium Lesion (2-5cm), Cystoscopy With Fulguration Medium Lesion (2-5cm), Bladder Injection Of Cancer Treatment, Percutaneous Lithotomy For Stone Over 2cm., Cystoscopy With Insertion Of Ureteral Stent Left, Cystoscopy With Ureteroscopy Left, Cystoscopy With Fulguration Small Lesion (5-83mm), Cystoscopy With Fulguration Small Lesion (5-24mm), Cystoscopy With Pyeloscopy With Lithotripsy, Cystoscopy With Insertion Of Ureteral Stent Right, Cystoscopy With Fulguration, Cystoscopy With  Insertion Of Ureteral Stent Right, Cystoscopy With Ureteroscopy With Removal Of Calculus, Cystoscopy With Insertion Of Ureteral Stent Left   NON-GU PSH: None   GU PMH: Bladder Cancer overlapping sites - 12/03/2015, Malignant neoplasm of overlapping sites of bladder, - 10/10/2015 Urinary Urgency, Urinary urgency - 05/31/2015 BPH w/LUTS, Benign prostatic hyperplasia (BPH) with straining on urination - 2016 Renal calculus, Nephrolithiasis - 2016      PMH Notes:   [name] [name] is a 77 year old previously followed by Dr. Tresa Endo. 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 He was noted to have a recurrence in 2013 and underwent TURBT in July 2013. He was also incidentally noted to have a stricture of the distal left ureter on RPG studies at that time and a brush biopsy was performed. He was found to have high grade Ta urothelial carcinoma in February 2016.    Oct 2007: TURBT, Low grade Ta  Jul 2013: TURBT (right bladder neck 1.5 cm tumor, low grade Ta),brush biopsy of distal left ureteral stricture (no malignancy)  Mar 2015: TURBT - Low grade Ta  Feb 2016: TURBT - High grade Ta, [facility]  Mar-Apr 2016: Induction BCG  Jul 2016: Maintenance BCG  Oct 2016: Multiple recurrent tumors noted on surveillance cystoscopy  Dec 2016: TUR - Multiple tumors over right posterior bladder - High grade, Ta  Dec 2016 - Feb 2017: 6 week induction BCG  May 2017: TURBT - High grade, Ta urothelial carcinoma (two tumors)  Oct 2017: Completed induction 6 week course of intravesical gemcitabine [facility] -  Dr. Ricky Ala)  Apr 2018: Two small recurrences (each < 1 cm)  Apr 2018: TURBT- High grade Ta, concern for possible lamina propria invasion for tumor at dome  May 2018: Repeat TURBT - No residual malignancy  Jul-Aug 2018 - Repeat induction 6 week course of intravesical gemcitabine [facility]-  Dr. Ricky Ala)    2) Nephrolithiasis: He has a long standing history of calcium oxalate stones. He was incidentally noted to have a large partial staghorn calculus during retrograde pyelography in February 2015.  Apr 2015: L PCNL   3) Prostate cancer screening: We discontinued screening in 2014 due to his low risk for prostate cancer at that time and progressive dementia.  Last PSA: 0.35 (June 2014)   4) BPH/LUTS: He is treated with Avodart.     NON-GU PMH: Bacteriuria (Stable) - 02/19/2017, (Stable), - 10/14/2016, - 09/09/2016 Diabetes Type 2 Hypertension Unspecified dementia without behavioral disturbance    FAMILY HISTORY: No pertinent family history - Other Prostate Cancer - No Family History   SOCIAL HISTORY: Marital Status: Married Preferred Language: English; Ethnicity: Not Hispanic Or Latino; Race: White Current Smoking Status: Patient has never smoked.  Has never drank.  Drinks 2 caffeinated drinks per day. Has not had a blood transfusion.     Notes: Never A Smoker   REVIEW OF SYSTEMS:    GU Review Male:   Patient denies frequent urination, hard to postpone urination, burning/ pain with urination, get up at night to urinate, leakage of urine, stream starts and stops, trouble starting your streams, and have to strain to urinate .  Gastrointestinal (Lower):   Patient denies diarrhea and constipation.  Gastrointestinal (Upper):   Patient denies nausea and vomiting.  Constitutional:   Patient denies fever, night sweats, weight loss, and fatigue.  Skin:   Patient denies skin rash/ lesion and itching.  Eyes:   Patient denies blurred vision and double vision.  Ears/ Nose/ Throat:   Patient denies sinus problems and sore throat.  Hematologic/Lymphatic:   Patient denies swollen glands and easy bruising.  Cardiovascular:   Patient denies leg swelling and chest pains.  Respiratory:   Patient denies cough and shortness of breath.  Endocrine:   Patient denies excessive thirst.   Musculoskeletal:   Patient denies back pain and joint pain.  Neurological:   Patient denies headaches and dizziness.  Psychologic:   Patient denies depression and anxiety.   VITAL SIGNS:     Weight 192 lb / 87.09 kg  Height 71 in / 180.34 cm  BMI 26.8 kg/m   GU PHYSICAL EXAMINATION:    Urethral Meatus: Normal size. No lesion, no wart, no discharge, no polyp. Normal location.   MULTI-SYSTEM PHYSICAL EXAMINATION:    Constitutional: Well-nourished. No physical deformities. Normally developed. Good grooming.        ASSESSMENT:      ICD-10 Details  1 GU:   Bladder Cancer overlapping sites - C67.8    PLAN:       1. BCG refractory, high risk non muscle invasive bladder cancer: He will proceed with cystoscopy and transurethral resection of his bladder tumor.

## 2017-11-11 ENCOUNTER — Ambulatory Visit (HOSPITAL_COMMUNITY): Payer: Medicare Other | Admitting: Certified Registered Nurse Anesthetist

## 2017-11-11 ENCOUNTER — Ambulatory Visit (HOSPITAL_COMMUNITY)
Admission: RE | Admit: 2017-11-11 | Discharge: 2017-11-11 | Disposition: A | Discharge status: Home / Self Care | Payer: Medicare Other | Source: Ambulatory Visit | Attending: Urology | Admitting: Urology

## 2017-11-11 ENCOUNTER — Encounter (HOSPITAL_COMMUNITY): Payer: Self-pay | Admitting: Emergency Medicine

## 2017-11-11 ENCOUNTER — Encounter (HOSPITAL_COMMUNITY)
Admission: RE | Disposition: A | Discharge status: Home / Self Care | Payer: Self-pay | Source: Ambulatory Visit | Attending: Urology

## 2017-11-11 DIAGNOSIS — I10 Essential (primary) hypertension: Secondary | ICD-10-CM | POA: Diagnosis not present

## 2017-11-11 DIAGNOSIS — E119 Type 2 diabetes mellitus without complications: Secondary | ICD-10-CM | POA: Insufficient documentation

## 2017-11-11 DIAGNOSIS — Z79899 Other long term (current) drug therapy: Secondary | ICD-10-CM | POA: Insufficient documentation

## 2017-11-11 DIAGNOSIS — C678 Malignant neoplasm of overlapping sites of bladder: Secondary | ICD-10-CM | POA: Diagnosis not present

## 2017-11-11 DIAGNOSIS — C679 Malignant neoplasm of bladder, unspecified: Secondary | ICD-10-CM | POA: Diagnosis not present

## 2017-11-11 DIAGNOSIS — G309 Alzheimer's disease, unspecified: Secondary | ICD-10-CM | POA: Diagnosis not present

## 2017-11-11 DIAGNOSIS — C674 Malignant neoplasm of posterior wall of bladder: Secondary | ICD-10-CM | POA: Insufficient documentation

## 2017-11-11 DIAGNOSIS — E785 Hyperlipidemia, unspecified: Secondary | ICD-10-CM | POA: Diagnosis not present

## 2017-11-11 DIAGNOSIS — Z7984 Long term (current) use of oral hypoglycemic drugs: Secondary | ICD-10-CM | POA: Diagnosis not present

## 2017-11-11 DIAGNOSIS — F0280 Dementia in other diseases classified elsewhere without behavioral disturbance: Secondary | ICD-10-CM | POA: Diagnosis not present

## 2017-11-11 HISTORY — PX: TRANSURETHRAL RESECTION OF BLADDER TUMOR: SHX2575

## 2017-11-11 HISTORY — PX: CYSTOSCOPY: SHX5120

## 2017-11-11 LAB — GLUCOSE, CAPILLARY
GLUCOSE-CAPILLARY: 171 mg/dL — AB (ref 65–99)
Glucose-Capillary: 186 mg/dL — ABNORMAL HIGH (ref 65–99)

## 2017-11-11 SURGERY — TURBT (TRANSURETHRAL RESECTION OF BLADDER TUMOR)
Anesthesia: General

## 2017-11-11 MED ORDER — LACTATED RINGERS IV SOLN
INTRAVENOUS | Status: DC
  Administered 2017-11-11: 11:00:00 via INTRAVENOUS

## 2017-11-11 MED ORDER — LIDOCAINE 2% (20 MG/ML) 5 ML SYRINGE
INTRAMUSCULAR | Status: DC | PRN
  Administered 2017-11-11: 60 mg via INTRAVENOUS

## 2017-11-11 MED ORDER — FENTANYL CITRATE (PF) 100 MCG/2ML IJ SOLN
INTRAMUSCULAR | Status: DC | PRN
  Administered 2017-11-11 (×3): 25 ug via INTRAVENOUS

## 2017-11-11 MED ORDER — FENTANYL CITRATE (PF) 100 MCG/2ML IJ SOLN
INTRAMUSCULAR | Status: AC
  Filled 2017-11-11: qty 2

## 2017-11-11 MED ORDER — FENTANYL CITRATE (PF) 100 MCG/2ML IJ SOLN: 25.0000 ug | INTRAMUSCULAR | Status: DC | PRN

## 2017-11-11 MED ORDER — DEXAMETHASONE SODIUM PHOSPHATE 10 MG/ML IJ SOLN
INTRAMUSCULAR | Status: AC
  Filled 2017-11-11: qty 1

## 2017-11-11 MED ORDER — PHENYLEPHRINE HCL 10 MG/ML IJ SOLN
INTRAMUSCULAR | Status: DC | PRN
  Administered 2017-11-11: 40 ug via INTRAVENOUS

## 2017-11-11 MED ORDER — CEFAZOLIN SODIUM-DEXTROSE 2-4 GM/100ML-% IV SOLN
2.0000 g | Freq: Once | INTRAVENOUS | Status: AC
  Administered 2017-11-11: 2 g via INTRAVENOUS
  Filled 2017-11-11: qty 100

## 2017-11-11 MED ORDER — ONDANSETRON HCL 4 MG/2ML IJ SOLN
INTRAMUSCULAR | Status: DC | PRN
  Administered 2017-11-11: 4 mg via INTRAVENOUS

## 2017-11-11 MED ORDER — 0.9 % SODIUM CHLORIDE (POUR BTL) OPTIME
TOPICAL | Status: DC | PRN
  Administered 2017-11-11: 1000 mL

## 2017-11-11 MED ORDER — ONDANSETRON HCL 4 MG/2ML IJ SOLN: 4.0000 mg | Freq: Once | INTRAMUSCULAR | Status: DC | PRN

## 2017-11-11 MED ORDER — PROPOFOL 10 MG/ML IV BOLUS
INTRAVENOUS | Status: AC
  Filled 2017-11-11: qty 20

## 2017-11-11 MED ORDER — ONDANSETRON HCL 4 MG/2ML IJ SOLN
INTRAMUSCULAR | Status: AC
  Filled 2017-11-11: qty 2

## 2017-11-11 MED ORDER — DEXAMETHASONE SODIUM PHOSPHATE 10 MG/ML IJ SOLN
INTRAMUSCULAR | Status: DC | PRN
  Administered 2017-11-11: 10 mg via INTRAVENOUS

## 2017-11-11 MED ORDER — SODIUM CHLORIDE 0.9 % IR SOLN
Status: DC | PRN
  Administered 2017-11-11: 6000 mL

## 2017-11-11 MED ORDER — PROPOFOL 10 MG/ML IV BOLUS
INTRAVENOUS | Status: DC | PRN
  Administered 2017-11-11: 20 mg via INTRAVENOUS
  Administered 2017-11-11 (×2): 10 mg via INTRAVENOUS
  Administered 2017-11-11: 200 mg via INTRAVENOUS
  Administered 2017-11-11: 10 mg via INTRAVENOUS

## 2017-11-11 MED ORDER — PHENAZOPYRIDINE HCL 100 MG PO TABS: 100.0000 mg | ORAL_TABLET | Freq: Three times a day (TID) | ORAL | 0 refills | Status: DC | PRN

## 2017-11-11 SURGICAL SUPPLY — 18 items
BAG URINE DRAINAGE (UROLOGICAL SUPPLIES) IMPLANT
BAG URO CATCHER STRL LF (MISCELLANEOUS) ×2 IMPLANT
CATH INTERMIT  6FR 70CM (CATHETERS) IMPLANT
CLOTH BEACON ORANGE TIMEOUT ST (SAFETY) ×2 IMPLANT
COVER FOOTSWITCH UNIV (MISCELLANEOUS) IMPLANT
COVER SURGICAL LIGHT HANDLE (MISCELLANEOUS) IMPLANT
CYSVIEW 100 KIT (MISCELLANEOUS) ×2 IMPLANT
ELECT REM PT RETURN 15FT ADLT (MISCELLANEOUS) ×2 IMPLANT
GLOVE BIOGEL M STRL SZ7.5 (GLOVE) ×2 IMPLANT
GOWN STRL REUS W/TWL LRG LVL3 (GOWN DISPOSABLE) ×4 IMPLANT
GUIDEWIRE STR DUAL SENSOR (WIRE) IMPLANT
LOOP CUT BIPOLAR 24F LRG (ELECTROSURGICAL) IMPLANT
MANIFOLD NEPTUNE II (INSTRUMENTS) ×2 IMPLANT
PACK CYSTO (CUSTOM PROCEDURE TRAY) ×2 IMPLANT
SET ASPIRATION TUBING (TUBING) IMPLANT
SYRINGE IRR TOOMEY STRL 70CC (SYRINGE) IMPLANT
TUBING CONNECTING 10 (TUBING) ×2 IMPLANT
TUBING UROLOGY SET (TUBING) ×2 IMPLANT

## 2017-11-11 NOTE — Transfer of Care (Signed)
Immediate Anesthesia Transfer of Care Note  Patient: [name] [name]  Procedure(s) Performed: TRANSURETHRAL RESECTION OF BLADDER TUMOR (TURBT) WITH CYSTOSCOPY (N/A ) BLUE LIGHT CYSTOSCOPY WITH CYSVIEW (N/A )  Patient Location: PACU  Anesthesia Type:General  Level of Consciousness: drowsy and patient cooperative  Airway & Oxygen Therapy: Patient Spontanous Breathing and Patient connected to face mask oxygen  Post-op Assessment: Report given to RN and Post -op Vital signs reviewed and stable  Post vital signs: Reviewed and stable  Last Vitals:  Vitals Value Taken Time  BP 125/67 11/11/2017  1:56 PM  Temp    Pulse 73 11/11/2017  2:00 PM  Resp 12 11/11/2017  2:00 PM  SpO2 100 % 11/11/2017  2:00 PM  Vitals shown include unvalidated device data.  Last Pain:  Vitals:   11/11/17 1121  TempSrc:   PainSc: 0-No pain      Patients Stated Pain Goal: 4 (87/57/97 2820)  Complications: No apparent anesthesia complications

## 2017-11-11 NOTE — Discharge Instructions (Addendum)
1. You may see some blood in the urine and may have some burning with urination for 48-72 hours. You also may notice that you have to urinate more frequently or urgently after your procedure which is normal.  2. You should call should you develop an inability urinate, fever > 101, persistent nausea and vomiting that prevents you from eating or drinking to stay hydrated.    Transurethral Resection of Bladder Tumor, Care After Refer to this sheet in the next few weeks. These instructions provide you with information about caring for yourself after your procedure. Your health care provider may also give you more specific instructions. Your treatment has been planned according to current medical practices, but problems sometimes occur. Call your health care provider if you have any problems or questions after your procedure. What can I expect after the procedure? After the procedure, it is common to have:  A small amount of blood in your urine for up to 2 weeks.  Soreness or mild discomfort from your catheter. After your catheter is removed, you may have mild soreness, especially when urinating.  Pain in your lower abdomen.  Follow these instructions at home:  Medicines  Take over-the-counter and prescription medicines only as told by your health care provider.  Do not drive or operate heavy machinery while taking prescription pain medicine.  Do not drive for 24 hours if you received a sedative.  If you were prescribed antibiotic medicine, take it as told by your health care provider. Do not stop taking the antibiotic even if you start to feel better. Activity  Return to your normal activities as told by your health care provider. Ask your health care provider what activities are safe for you.  Do not lift anything that is heavier than 10 lb (4.5 kg) for as long as told by your health care provider.  Avoid intense physical activity for as long as told by your health care provider.  Walk  at least one time every day. This helps to prevent blood clots. You may increase your physical activity gradually as you start to feel better. General instructions  Do not drink alcohol for as long as told by your health care provider. This is especially important if you are taking prescription pain medicines.  Do not take baths, swim, or use a hot tub until your health care provider approves.  If you have a catheter, follow instructions from your health care provider about caring for your catheter and your drainage bag.  Drink enough fluid to keep your urine clear or pale yellow.  Wear compression stockings as told by your health care provider. These stockings help to prevent blood clots and reduce swelling in your legs.  Keep all follow-up visits as told by your health care provider. This is important. Contact a health care provider if:  You have pain that gets worse or does not improve with medicine.  You have blood in your urine for more than 2 weeks.  You have cloudy or bad-smelling urine.  You become constipated. Signs of constipation may include having: ? Fewer than three bowel movements in a week. ? Difficulty having a bowel movement. ? Stools that are dry, hard, or larger than normal.  You have a fever. Get help right away if:  You have: ? Severe pain. ? Bright-red blood in your urine. ? Blood clots in your urine. ? A lot of blood in your urine.  Your catheter has been removed and you are not able to  urinate.  You have a catheter in place and the catheter is not draining urine. This information is not intended to replace advice given to you by your health care provider. Make sure you discuss any questions you have with your health care provider. Document Released: 05/13/2015 Document Revised: 02/02/2016 Document Reviewed: 02/21/2015 Elsevier Interactive Patient Education  2018 [place] Anesthesia, Adult, Care After These instructions provide you  with information about caring for yourself after your procedure. Your health care provider may also give you more specific instructions. Your treatment has been planned according to current medical practices, but problems sometimes occur. Call your health care provider if you have any problems or questions after your procedure. What can I expect after the procedure? After the procedure, it is common to have:  Vomiting.  A sore throat.  Mental slowness.  It is common to feel:  Nauseous.  Cold or shivery.  Sleepy.  Tired.  Sore or achy, even in parts of your body where you did not have surgery.  Follow these instructions at home: For at least 24 hours after the procedure:  Do not: ? Participate in activities where you could fall or become injured. ? Drive. ? Use heavy machinery. ? Drink alcohol. ? Take sleeping pills or medicines that cause drowsiness. ? Make important decisions or sign legal documents. ? Take care of children on your own.  Rest. Eating and drinking  If you vomit, drink water, juice, or soup when you can drink without vomiting.  Drink enough fluid to keep your urine clear or pale yellow.  Make sure you have little or no nausea before eating solid foods.  Follow the diet recommended by your health care provider. General instructions  Have a responsible adult stay with you until you are awake and alert.  Return to your normal activities as told by your health care provider. Ask your health care provider what activities are safe for you.  Take over-the-counter and prescription medicines only as told by your health care provider.  If you smoke, do not smoke without supervision.  Keep all follow-up visits as told by your health care provider. This is important. Contact a health care provider if:  You continue to have nausea or vomiting at home, and medicines are not helpful.  You cannot drink fluids or start eating again.  You cannot urinate  after 8-12 hours.  You develop a skin rash.  You have fever.  You have increasing redness at the site of your procedure. Get help right away if:  You have difficulty breathing.  You have chest pain.  You have unexpected bleeding.  You feel that you are having a life-threatening or urgent problem. This information is not intended to replace advice given to you by your health care provider. Make sure you discuss any questions you have with your health care provider. Document Released: 09/07/2000 Document Revised: 11/04/2015 Document Reviewed: 05/16/2015 Elsevier Interactive Patient Education  [employer].

## 2017-11-11 NOTE — Anesthesia Procedure Notes (Signed)
Procedure Name: LMA Insertion Date/Time: 11/11/2017 1:05 PM Performed by: West Pugh, CRNA Pre-anesthesia Checklist: Patient identified, Emergency Drugs available, Suction available, Patient being monitored and Timeout performed Patient Re-evaluated:Patient Re-evaluated prior to induction Oxygen Delivery Method: Circle system utilized Preoxygenation: Pre-oxygenation with 100% oxygen Induction Type: IV induction LMA: LMA with gastric port inserted LMA Size: 4.0 Number of attempts: 1 Placement Confirmation: positive ETCO2 and breath sounds checked- equal and bilateral Tube secured with: Tape Dental Injury: Teeth and Oropharynx as per pre-operative assessment

## 2017-11-11 NOTE — Anesthesia Postprocedure Evaluation (Signed)
Anesthesia Post Note  Patient: HARIM BI  Procedure(s) Performed: TRANSURETHRAL RESECTION OF BLADDER TUMOR (TURBT) WITH CYSTOSCOPY (N/A ) BLUE LIGHT CYSTOSCOPY WITH CYSVIEW (N/A )     Patient location during evaluation: PACU Anesthesia Type: General Level of consciousness: awake and alert Pain management: pain level controlled Vital Signs Assessment: post-procedure vital signs reviewed and stable Respiratory status: spontaneous breathing, nonlabored ventilation, respiratory function stable and patient connected to nasal cannula oxygen Cardiovascular status: blood pressure returned to baseline and stable Postop Assessment: no apparent nausea or vomiting Anesthetic complications: no    Last Vitals:  Vitals:   11/11/17 1503 11/11/17 1543  BP: (!) 144/76 127/83  Pulse: 77 78  Resp: 16 16  Temp: 36.9 C 36.4 C  SpO2: 97% 100%    Last Pain:  Vitals:   11/11/17 1543  TempSrc: Axillary  PainSc: 0-No pain                 Ryan P Ellender

## 2017-11-11 NOTE — Anesthesia Preprocedure Evaluation (Addendum)
Anesthesia Evaluation  Patient identified by MRN, date of birth, ID band  Reviewed: Allergy & Precautions, NPO status , Patient's Chart, lab work & pertinent test results, reviewed documented beta blocker date and time   Airway Mallampati: III  TM Distance: >3 FB Neck ROM: Full    Dental no notable dental hx.    Pulmonary neg pulmonary ROS,    Pulmonary exam normal breath sounds clear to auscultation       Cardiovascular hypertension, Pt. on home beta blockers Normal cardiovascular exam Rhythm:Regular Rate:Normal  ECG: ST, rate 103, freq PVC's   Neuro/Psych PSYCHIATRIC DISORDERS Dementia Alzheimer's disease    GI/Hepatic negative GI ROS, Neg liver ROS,   Endo/Other  diabetes, Oral Hypoglycemic Agents  Renal/GU negative Renal ROS     Musculoskeletal negative musculoskeletal ROS (+)   Abdominal   Peds  Hematology HLD   Anesthesia Other Findings BLADDER CANCER  Reproductive/Obstetrics                            Anesthesia Physical Anesthesia Plan  ASA: III  Anesthesia Plan: General   Post-op Pain Management:    Induction: Intravenous  PONV Risk Score and Plan: 2 and Ondansetron, Dexamethasone and Treatment may vary due to age or medical condition  Airway Management Planned: LMA  Additional Equipment:   Intra-op Plan:   Post-operative Plan: Extubation in OR  Informed Consent: I have reviewed the patients History and Physical, chart, labs and discussed the procedure including the risks, benefits and alternatives for the proposed anesthesia with the patient or authorized representative who has indicated his/her understanding and acceptance.   Dental advisory given  Plan Discussed with: CRNA  Anesthesia Plan Comments:         Anesthesia Quick Evaluation

## 2017-11-11 NOTE — Op Note (Signed)
Preoperative diagnosis: Urothelial carcinoma of the bladder  Postoperative diagnosis: Urothelial carcinoma of the bladder  Procedures:  1. Blue light cystoscopy 2. Exam under anesthesia 3. Transurethral resection of bladder tumor (1 cm)  Surgeon: Dr. Raynelle Bring  Anesthesia: General  Complications: None  EBL: Minimal  Specimens: 1. Posterior bladder tumor 2. 2nd posterior bladder tumor  Disposition of specimens: Pathology  Indication: Mr. 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  He was recently noted to have a small recurrent tumor at the posterior bladder wall with an adjacent area of possible recurrence at a prior episode of resection. I discussed the potential benefits and risks of the procedure, side effects of the proposed treatment, the likelihood of the patient achieving the goals of the procedure, and any potential problems that might occur during the procedure or recuperation. He and his wife gave informed consent to proceed.  Description of procedure:  He was taken to the operating room and general anesthesia was induced.  He was placed in the dorsal lithotomy positions, prepped and draped in a sterile fashion, and preoperative antibiotics were administered. Preoperative time out was performed. Cysview had been administered in the bladder via a catheter preoperatively.  Cystoscopy was performed with a 30 and 70 degrees lens with white light.  This revealed findings including a 1 cm papillary tumor at the posterior midline with an adjacent area of raised and mildly suspicious urothelium at an area of prior resection just adjacent to this.  I then used blue light cystoscopy and performed a careful survey of the bladder and only identified the previously mentioned two areas of concern to be illuminated with blue light.  I used the cold cup forceps to resect the small papillary lesion and this was  sent as specimen number one along with another biopsy of the adjacent area.  I then removed the cystoscope and replaced it with the 26 Fr resectoscope.  Using bipolar cutting current, I resected the abnormal urothelium at the posterior bladder adjacent to the the papillary tumor that had been removed.  Once all abnormal mucosa and underlying tissue had been resected, hemostasis was achieved with bipolar cautery.  The specimens were removed as specimen number two.  The bladder was reinspected.  A pelvic exam under anesthesia was performed and was unremarkable.  No prostatic nodules or pelvic masses were noted.  Mr. Degollado has continued to receive monthly gemcitabine intravesically and so additional postoperative chemotherapy was not felt to be indicated.  He tolerated the procedure well and without complications.

## 2017-11-12 ENCOUNTER — Telehealth (HOSPITAL_COMMUNITY): Payer: Self-pay | Admitting: *Deleted

## 2017-11-12 ENCOUNTER — Encounter (HOSPITAL_COMMUNITY): Payer: Self-pay | Admitting: Urology

## 2017-11-24 DIAGNOSIS — C678 Malignant neoplasm of overlapping sites of bladder: Secondary | ICD-10-CM | POA: Diagnosis not present

## 2017-11-30 DIAGNOSIS — C679 Malignant neoplasm of bladder, unspecified: Secondary | ICD-10-CM | POA: Diagnosis not present

## 2017-12-01 DIAGNOSIS — E118 Type 2 diabetes mellitus with unspecified complications: Secondary | ICD-10-CM | POA: Diagnosis not present

## 2017-12-01 DIAGNOSIS — I10 Essential (primary) hypertension: Secondary | ICD-10-CM | POA: Diagnosis not present

## 2017-12-07 DIAGNOSIS — E1165 Type 2 diabetes mellitus with hyperglycemia: Secondary | ICD-10-CM | POA: Diagnosis not present

## 2017-12-07 DIAGNOSIS — E782 Mixed hyperlipidemia: Secondary | ICD-10-CM | POA: Diagnosis not present

## 2017-12-07 DIAGNOSIS — E118 Type 2 diabetes mellitus with unspecified complications: Secondary | ICD-10-CM | POA: Diagnosis not present

## 2017-12-07 DIAGNOSIS — I10 Essential (primary) hypertension: Secondary | ICD-10-CM | POA: Diagnosis not present

## 2017-12-28 DIAGNOSIS — F0390 Unspecified dementia without behavioral disturbance: Secondary | ICD-10-CM | POA: Diagnosis not present

## 2017-12-28 DIAGNOSIS — E782 Mixed hyperlipidemia: Secondary | ICD-10-CM | POA: Diagnosis not present

## 2017-12-28 DIAGNOSIS — E1165 Type 2 diabetes mellitus with hyperglycemia: Secondary | ICD-10-CM | POA: Diagnosis not present

## 2017-12-28 DIAGNOSIS — E118 Type 2 diabetes mellitus with unspecified complications: Secondary | ICD-10-CM | POA: Diagnosis not present

## 2018-01-03 DIAGNOSIS — C679 Malignant neoplasm of bladder, unspecified: Secondary | ICD-10-CM | POA: Diagnosis not present

## 2018-02-01 DIAGNOSIS — Z85828 Personal history of other malignant neoplasm of skin: Secondary | ICD-10-CM | POA: Diagnosis not present

## 2018-02-01 DIAGNOSIS — L821 Other seborrheic keratosis: Secondary | ICD-10-CM | POA: Diagnosis not present

## 2018-02-01 DIAGNOSIS — D1801 Hemangioma of skin and subcutaneous tissue: Secondary | ICD-10-CM | POA: Diagnosis not present

## 2018-02-01 DIAGNOSIS — D692 Other nonthrombocytopenic purpura: Secondary | ICD-10-CM | POA: Diagnosis not present

## 2018-02-01 DIAGNOSIS — L570 Actinic keratosis: Secondary | ICD-10-CM | POA: Diagnosis not present

## 2018-02-04 ENCOUNTER — Ambulatory Visit (INDEPENDENT_AMBULATORY_CARE_PROVIDER_SITE_OTHER): Payer: Medicare Other | Admitting: Podiatry

## 2018-02-04 ENCOUNTER — Telehealth: Payer: Self-pay | Admitting: *Deleted

## 2018-02-04 ENCOUNTER — Encounter: Payer: Self-pay | Admitting: Podiatry

## 2018-02-04 VITALS — BP 135/59 | HR 85

## 2018-02-04 DIAGNOSIS — E119 Type 2 diabetes mellitus without complications: Secondary | ICD-10-CM | POA: Diagnosis not present

## 2018-02-04 DIAGNOSIS — B351 Tinea unguium: Secondary | ICD-10-CM | POA: Diagnosis not present

## 2018-02-04 DIAGNOSIS — M79674 Pain in right toe(s): Secondary | ICD-10-CM

## 2018-02-04 DIAGNOSIS — M79675 Pain in left toe(s): Secondary | ICD-10-CM | POA: Diagnosis not present

## 2018-02-04 NOTE — Patient Instructions (Signed)
Onychomycosis/Fungal Toenails  WHAT IS IT? An infection that lies within the keratin of your nail plate that is caused by a fungus.  WHY ME? Fungal infections affect all ages, sexes, races, and creeds.  There may be many factors that predispose you to a fungal infection such as age, coexisting medical conditions such as diabetes, or an autoimmune disease; stress, medications, fatigue, genetics, etc.  Bottom line: fungus thrives in a warm, moist environment and your shoes offer such a location.  IS IT CONTAGIOUS? Theoretically, yes.  You do not want to share shoes, nail clippers or files with someone who has fungal toenails.  Walking around barefoot in the same room or sleeping in the same bed is unlikely to transfer the organism.  It is important to realize, however, that fungus can spread easily from one nail to the next on the same foot.  HOW DO WE TREAT THIS?  There are several ways to treat this condition.  Treatment may depend on many factors such as age, medications, pregnancy, liver and kidney conditions, etc.  It is best to ask your doctor which options are available to you.  1. No treatment.   Unlike many other medical concerns, you can live with this condition.  However for many people this can be a painful condition and may lead to ingrown toenails or a bacterial infection.  It is recommended that you keep the nails cut short to help reduce the amount of fungal nail. 2. Topical treatment.  These range from herbal remedies to prescription strength nail lacquers.  About 40-50% effective, topicals require twice daily application for approximately 9 to 12 months or until an entirely new nail has grown out.  The most effective topicals are medical grade medications available through physicians offices. 3. Oral antifungal medications.  With an 80-90% cure rate, the most common oral medication requires 3 to 4 months of therapy and stays in your system for a year as the new nail grows out.  Oral  antifungal medications do require blood work to make sure it is a safe drug for you.  A liver function panel will be performed prior to starting the medication and after the first month of treatment.  It is important to have the blood work performed to avoid any harmful side effects.  In general, this medication safe but blood work is required. 4. Laser Therapy.  This treatment is performed by applying a specialized laser to the affected nail plate.  This therapy is noninvasive, fast, and non-painful.  It is not covered by insurance and is therefore, out of pocket.  The results have been very good with a 80-95% cure rate.  [facility] is the only practice in the area to offer this therapy. Permanent Nail Avulsion.  Removing the entire nail so that a new nail will not grow back.Diabetes and Foot Care Diabetes may cause you to have problems because of poor blood supply (circulation) to your feet and legs. This may cause the skin on your feet to become thinner, break easier, and heal more slowly. Your skin may become dry, and the skin may peel and crack. You may also have nerve damage in your legs and feet causing decreased feeling in them. You may not notice minor injuries to your feet that could lead to infections or more serious problems. Taking care of your feet is one of the most important things you can do for yourself. Follow these instructions at home:  Wear shoes at all   times, even in the house. Do not go barefoot. Bare feet are easily injured.  Check your feet daily for blisters, cuts, and redness. If you cannot see the bottom of your feet, use a mirror or ask someone for help.  Wash your feet with warm water (do not use hot water) and mild soap. Then pat your feet and the areas between your toes until they are completely dry. Do not soak your feet as this can dry your skin.  Apply a moisturizing lotion or petroleum jelly (that does not contain alcohol and is unscented) to the skin on  your feet and to dry, brittle toenails. Do not apply lotion between your toes.  Trim your toenails straight across. Do not dig under them or around the cuticle. File the edges of your nails with an emery board or nail file.  Do not cut corns or calluses or try to remove them with medicine.  Wear clean socks or stockings every day. Make sure they are not too tight. Do not wear knee-high stockings since they may decrease blood flow to your legs.  Wear shoes that fit properly and have enough cushioning. To break in new shoes, wear them for just a few hours a day. This prevents you from injuring your feet. Always look in your shoes before you put them on to be sure there are no objects inside.  Do not cross your legs. This may decrease the blood flow to your feet.  If you find a minor scrape, cut, or break in the skin on your feet, keep it and the skin around it clean and dry. These areas may be cleansed with mild soap and water. Do not cleanse the area with peroxide, alcohol, or iodine.  When you remove an adhesive bandage, be sure not to damage the skin around it.  If you have a wound, look at it several times a day to make sure it is healing.  Do not use heating pads or hot water bottles. They may burn your skin. If you have lost feeling in your feet or legs, you may not know it is happening until it is too late.  Make sure your health care provider performs a complete foot exam at least annually or more often if you have foot problems. Report any cuts, sores, or bruises to your health care provider immediately. Contact a health care provider if:  You have an injury that is not healing.  You have cuts or breaks in the skin.  You have an ingrown nail.  You notice redness on your legs or feet.  You feel burning or tingling in your legs or feet.  You have pain or cramps in your legs and feet.  Your legs or feet are numb.  Your feet always feel cold. Get help right away if:  There  is increasing redness, swelling, or pain in or around a wound.  There is a red line that goes up your leg.  Pus is coming from a wound.  You develop a fever or as directed by your health care provider.  You notice a bad smell coming from an ulcer or wound. This information is not intended to replace advice given to you by your health care provider. Make sure you discuss any questions you have with your health care provider. Document Released: 05/29/2000 Document Revised: 11/07/2015 Document Reviewed: 11/08/2012 Elsevier Interactive Patient Education  2017 Elsevier Inc[employer]  

## 2018-02-07 NOTE — Telephone Encounter (Signed)
Dr. Elisha Ponder ordered  Onychomycosis Nail Lacquer from [facility].

## 2018-02-08 ENCOUNTER — Encounter: Payer: Self-pay | Admitting: Podiatry

## 2018-02-08 NOTE — Progress Notes (Signed)
Subjective: [name] [name] is seen today in clinic. He presents today with  cc of painful, discolored, thick toenails which interfere with daily activities and routine tasks.  Pain is aggravated when wearing enclosed shoe gear. Pain is getting progressively worse. Medical History Collapse by Default  Collapse by Default     10/12/2013 Renal calculus, left  08-09-13 UTI (urinary tract infection)   12-11-11 Hypertension   12-11-11 Cancer ([place])   12-11-11 Dementia   12-11-11 Diabetes mellitus   Date Unknown Alzheimer's disease  Date Unknown BPH (benign prostatic hypertrophy)  Date Unknown Hearing loss   Date Unknown History of chemotherapy  Date Unknown Hx of nonmelanoma skin cancer  Date Unknown Hypercholesterolemia   Problem List Collapse by Default  Collapse by Default     New problems from outside sources are available for reconciliation  Cardiovascular and Mediastinum  Unspecified essential hypertension   Endocrine  Type II or unspecified type diabetes mellitus without mention of complication, not stated as uncontrolled   Genitourinary  Renal calculus, left   Other  Memory loss    Surgical History Collapse by Default  Collapse by Default     11/11/2017 Transurethral resection of bladder tumor (N/A)   11/11/2017 Cystoscopy (N/A)   10/19/2016 Transurethral resection of bladder tumor (N/A)   10/19/2016 Cystoscopy (N/A)   09/28/2016 Transurethral resection of bladder tumor (N/A)   09/28/2016 Cystoscopy w/ retrogrades (N/A)   03/02/2016 Transurethral resection of bladder tumor (N/A)   03/02/2016 Cystoscopy w/ retrogrades (Bilateral)   11/18/2015 Cystoscopy (N/A)   11/18/2015 Transurethral resection of bladder tumor (N/A)   05/13/2015 Cystoscopy w/ retrogrades (Bilateral)   05/13/2015 Transurethral resection of bladder tumor (N/A)   07/23/2014 Transurethral resection of bladder tumor (N/A)   07/23/2014 Cystoscopy w/ retrogrades (Bilateral)   10/12/2013 Nephrolithotomy (Left)   08/14/2013  Cystoscopy w/ retrogrades (Bilateral)   08/14/2013 Transurethral resection of bladder tumor (N/A)   12/21/2011 Transurethral resection of bladder tumor   12/21/2011 Cystoscopy w/ retrogrades   12/21/2011 Cystoscopy with biopsy   12-11-11 Bladder surgery   12-11-11 Lithotripsy   12-11-11 Tonsillectomy   10-09-2008 cystoscopy [Other]  05-16-2007 Cystoscopy  04-29-2007 stent to kidney [Other]  2005 Extracorporeal shock wave lithotripsy  Date Unknown Cataract extraction, bilateral   Date Unknown Eye surgery  11-12-15 and feb 2017 mose procedure [Other] (Left)  Date Unknown Subdural hemorrhage drainage    Medications    dutasteride (AVODART) 0.5 MG capsule    glipiZIDE (GLUCOTROL) 5 MG tablet    hydrochlorothiazide (HYDRODIURIL) 25 MG tablet    memantine (NAMENDA XR) 28 MG CP24 24 hr capsule    metFORMIN (GLUCOPHAGE) 500 MG tablet    metoprolol succinate (TOPROL-XL) 25 MG 24 hr tablet    Multiple Vitamin (MULTIVITAMIN WITH MINERALS) TABS tablet    NON FORMULARY    Omega-3 Fatty Acids (FISH OIL) 1200 MG CAPS    perindopril (ACEON) 4 MG tablet    phenazopyridine (PYRIDIUM) 100 MG tablet    saxagliptin HCl (ONGLYZA) 5 MG TABS tablet    vitamin B-12 (CYANOCOBALAMIN) 500 MCG tablet    Allergies     Sulfa AntibioticsHives  Tetracyclines & RelatedRash  Aricept [Donepezil Hcl]Nausea And Vomiting   Tobacco History   Smoking Status  Never Smoker  Smokeless Tobacco Status  Never Used   Family History Collapse by Default  Collapse by Default     Mother (Deceased)      Father (Deceased)   ROS: Per HPI unless specifically indicated in ROS section  Objective:   Vitals:   02/04/18 1353  BP: (!) 135/59  Pulse: 85   Vascular Examination: Capillary refill time immediate x 10 digits Dorsalis pedis and posterior tibial pulses present b/l Sparse digital hair x 10 digits Skin temperature warm to warm b/l  Dermatological Examination: Skin with normal turgor, texture and tone b/l Toenails  1-5 b/l discolored, thick, dystrophic with subungual debris and pain with palpation to nailbeds due to thickness of nails.  Musculoskeletal: Muscle strength 5/5 to all LE muscle groups  Neurological: Sensation intact with 10 gram monofilament. Vibratory sensation intact.  Assessment: Painful onychomycosis toenails 1-5 b/l  Diabetes with no complications  Plan: 1. Discussed onychomycosis and topical treatment options. He agreed to [employer] onychomycosis topical therapy. Medication will be mailed to his home fro [employer]. 2. Toenails 1-5 b/l were debrided in length and girth without iatrogenic bleeding. 3. Patient to continue soft, supportive shoe gear 4. Patient to report any pedal injuries to medical professional immediately. 5. Follow up 3 months.  6. Patient/POA to call should there be a concern in the interim.

## 2018-03-23 DIAGNOSIS — C678 Malignant neoplasm of overlapping sites of bladder: Secondary | ICD-10-CM | POA: Diagnosis not present

## 2018-04-05 DIAGNOSIS — Z0000 Encounter for general adult medical examination without abnormal findings: Secondary | ICD-10-CM | POA: Diagnosis not present

## 2018-04-05 DIAGNOSIS — E1165 Type 2 diabetes mellitus with hyperglycemia: Secondary | ICD-10-CM | POA: Diagnosis not present

## 2018-04-05 DIAGNOSIS — F0390 Unspecified dementia without behavioral disturbance: Secondary | ICD-10-CM | POA: Diagnosis not present

## 2018-04-05 DIAGNOSIS — E782 Mixed hyperlipidemia: Secondary | ICD-10-CM | POA: Diagnosis not present

## 2018-04-05 DIAGNOSIS — N390 Urinary tract infection, site not specified: Secondary | ICD-10-CM | POA: Diagnosis not present

## 2018-04-05 DIAGNOSIS — I10 Essential (primary) hypertension: Secondary | ICD-10-CM | POA: Diagnosis not present

## 2018-04-05 DIAGNOSIS — E118 Type 2 diabetes mellitus with unspecified complications: Secondary | ICD-10-CM | POA: Diagnosis not present

## 2018-04-12 DIAGNOSIS — C678 Malignant neoplasm of overlapping sites of bladder: Secondary | ICD-10-CM | POA: Diagnosis not present

## 2018-04-12 DIAGNOSIS — I10 Essential (primary) hypertension: Secondary | ICD-10-CM | POA: Diagnosis not present

## 2018-04-12 DIAGNOSIS — E1165 Type 2 diabetes mellitus with hyperglycemia: Secondary | ICD-10-CM | POA: Diagnosis not present

## 2018-04-12 DIAGNOSIS — E782 Mixed hyperlipidemia: Secondary | ICD-10-CM | POA: Diagnosis not present

## 2018-04-12 DIAGNOSIS — E118 Type 2 diabetes mellitus with unspecified complications: Secondary | ICD-10-CM | POA: Diagnosis not present

## 2018-04-12 DIAGNOSIS — F0390 Unspecified dementia without behavioral disturbance: Secondary | ICD-10-CM | POA: Diagnosis not present

## 2018-04-28 ENCOUNTER — Encounter: Payer: Self-pay | Admitting: Podiatry

## 2018-04-28 ENCOUNTER — Ambulatory Visit (INDEPENDENT_AMBULATORY_CARE_PROVIDER_SITE_OTHER): Payer: Medicare Other | Admitting: Podiatry

## 2018-04-28 DIAGNOSIS — B351 Tinea unguium: Secondary | ICD-10-CM

## 2018-04-28 DIAGNOSIS — M79674 Pain in right toe(s): Secondary | ICD-10-CM

## 2018-04-28 DIAGNOSIS — M79675 Pain in left toe(s): Secondary | ICD-10-CM

## 2018-04-28 DIAGNOSIS — E119 Type 2 diabetes mellitus without complications: Secondary | ICD-10-CM

## 2018-04-28 NOTE — Patient Instructions (Signed)

## 2018-05-06 DIAGNOSIS — D485 Neoplasm of uncertain behavior of skin: Secondary | ICD-10-CM | POA: Diagnosis not present

## 2018-05-06 DIAGNOSIS — L570 Actinic keratosis: Secondary | ICD-10-CM | POA: Diagnosis not present

## 2018-05-06 DIAGNOSIS — D044 Carcinoma in situ of skin of scalp and neck: Secondary | ICD-10-CM | POA: Diagnosis not present

## 2018-05-06 DIAGNOSIS — C4442 Squamous cell carcinoma of skin of scalp and neck: Secondary | ICD-10-CM | POA: Diagnosis not present

## 2018-05-06 DIAGNOSIS — Z85828 Personal history of other malignant neoplasm of skin: Secondary | ICD-10-CM | POA: Diagnosis not present

## 2018-05-16 ENCOUNTER — Encounter: Payer: Self-pay | Admitting: Podiatry

## 2018-05-16 NOTE — Progress Notes (Signed)
Subjective: [name] [name] presents with diabetes and cc of painful, discolored, thick toenails and painful callus/corn which interfere with activities of daily living. Pain is aggravated when wearing enclosed shoe gear. Pain is relieved with periodic professional debridement.  [name] [name] voices no new problems on today's visit.  PCP is [name] [name]  Objective:  Vascular Examination: Capillary refill time immediate x 10 digits Dorsalis pedis and Posterior tibial pulses present b/l Sparse digital hair x 10 digits Skin temperature gradient WNL b/l  Dermatological Examination: Skin with normal turgor, texture and tone b/l  Toenails 1-5 b/l discolored, thick, dystrophic with subungual debris and pain with palpation to nailbeds due to thickness of nails.   Musculoskeletal: Muscle strength 5/5 to all LE muscle groups  Neurological: Sensation diminished with 10 gram monofilament. Vibratory sensation diminished  Assessment: 1. Painful onychomycosis toenails 1-5 b/l  Plan: 1. Continue diabetic foot care principles.  2. Toenails 1-5 b/l were debrided in length and girth without iatrogenic bleeding. 3. Patient to continue soft, supportive shoe gear 4. Patient to report any pedal injuries to medical professional  5. Follow up 3 months. Patient/POA to call should there be a concern in the interim.

## 2018-05-19 DIAGNOSIS — E119 Type 2 diabetes mellitus without complications: Secondary | ICD-10-CM | POA: Diagnosis not present

## 2018-05-19 DIAGNOSIS — H349 Unspecified retinal vascular occlusion: Secondary | ICD-10-CM | POA: Diagnosis not present

## 2018-05-19 DIAGNOSIS — H5202 Hypermetropia, left eye: Secondary | ICD-10-CM | POA: Diagnosis not present

## 2018-05-20 DIAGNOSIS — H35362 Drusen (degenerative) of macula, left eye: Secondary | ICD-10-CM | POA: Diagnosis not present

## 2018-05-20 DIAGNOSIS — H34811 Central retinal vein occlusion, right eye, with macular edema: Secondary | ICD-10-CM | POA: Diagnosis not present

## 2018-05-20 DIAGNOSIS — H3561 Retinal hemorrhage, right eye: Secondary | ICD-10-CM | POA: Diagnosis not present

## 2018-06-23 DIAGNOSIS — H34811 Central retinal vein occlusion, right eye, with macular edema: Secondary | ICD-10-CM | POA: Diagnosis not present

## 2018-07-28 DIAGNOSIS — H34811 Central retinal vein occlusion, right eye, with macular edema: Secondary | ICD-10-CM | POA: Diagnosis not present

## 2018-08-02 ENCOUNTER — Ambulatory Visit (INDEPENDENT_AMBULATORY_CARE_PROVIDER_SITE_OTHER): Payer: Medicare Other | Admitting: Podiatry

## 2018-08-02 DIAGNOSIS — M79675 Pain in left toe(s): Secondary | ICD-10-CM

## 2018-08-02 DIAGNOSIS — B351 Tinea unguium: Secondary | ICD-10-CM | POA: Diagnosis not present

## 2018-08-02 DIAGNOSIS — M79674 Pain in right toe(s): Secondary | ICD-10-CM

## 2018-08-02 NOTE — Patient Instructions (Signed)

## 2018-08-10 DIAGNOSIS — C678 Malignant neoplasm of overlapping sites of bladder: Secondary | ICD-10-CM | POA: Diagnosis not present

## 2018-08-11 ENCOUNTER — Encounter: Payer: Self-pay | Admitting: Podiatry

## 2018-08-11 NOTE — Progress Notes (Signed)
Subjective: [name] [name] presents today for preventative diabetic foot care.  He presents with painful, thick toenails 1-5 b/l that he cannot cut and which interfere with daily activities.  Pain is aggravated when wearing enclosed shoe gear.  [name] [name] voices no new pedal concerns on today's visit.  States his blood sugar was 171 mg/dL this morning.  Last hemoglobin A1c 6.7.  [name] Seashore, MD is his PCP.   Current Outpatient Medications:  .  dutasteride (AVODART) 0.5 MG capsule, Take 0.5 mg by mouth daily. , Disp: , Rfl:  .  glipiZIDE (GLUCOTROL) 5 MG tablet, Take 5 mg by mouth daily before breakfast., Disp: , Rfl:  .  hydrochlorothiazide (HYDRODIURIL) 25 MG tablet, Take 25 mg by mouth daily. , Disp: , Rfl:  .  imiquimod (ALDARA) 5 % cream, APPLY ON THE SKIN AS DIRECTED APPLY THIN COAT 5 DAYS A WEEK FOR 4 WEEKS., Disp: , Rfl: 0 .  memantine (NAMENDA XR) 28 MG CP24 24 hr capsule, Take 28 mg by mouth every morning. , Disp: , Rfl:  .  metFORMIN (GLUCOPHAGE) 500 MG tablet, Take 1,000 mg by mouth 2 (two) times daily with a meal., Disp: , Rfl:  .  metoprolol succinate (TOPROL-XL) 25 MG 24 hr tablet, Take 25 mg by mouth daily. , Disp: , Rfl:  .  Multiple Vitamin (MULTIVITAMIN WITH MINERALS) TABS tablet, Take 1 tablet by mouth every evening., Disp: , Rfl:  .  NON FORMULARY, [facility]  Onychomycosis Nail Lacquer -  Fluconazole 2%, Terbinafine 1% DMSO Apply to affected nail once daily Qty. 120 gm 3 refills, Disp: , Rfl:  .  Omega-3 Fatty Acids (FISH OIL) 1200 MG CAPS, Take 1,200 mg by mouth every evening., Disp: , Rfl:  .  ONETOUCH DELICA LANCETS 06T MISC, 2 (two) times daily. as directed, Disp: , Rfl: 4 .  perindopril (ACEON) 4 MG tablet, Take 4 mg by mouth daily. , Disp: , Rfl:  .  phenazopyridine (PYRIDIUM) 100 MG tablet, Take 1 tablet (100 mg total) by mouth 3 (three) times daily as needed for pain (for burning)., Disp: 10 tablet, Rfl: 0 .  saxagliptin HCl (ONGLYZA) 5 MG TABS  tablet, Take 5 mg by mouth daily., Disp: , Rfl:  .  UNABLE TO FIND, APPLY TO AFFECTED NAIL(S) DAILY AS DIRECTED BY YOUR DOCTOR. THIS IS A COMPOUNDED MEDICATION., Disp: , Rfl: 3 .  vitamin B-12 (CYANOCOBALAMIN) 500 MCG tablet, Take 500 mcg by mouth every evening., Disp: , Rfl:   Allergies  Allergen Reactions  . Sulfa Antibiotics Hives  . Tetracyclines & Related Rash  . Aricept [Donepezil Hcl] Nausea And Vomiting    Objective:  Vascular Examination: Capillary refill time immediate x 10 digits  Dorsalis pedis and Posterior tibial pulses palpable b/l  Sparse digital hair present x 10 digits  Skin temperature gradient WNL b/l  Dermatological Examination: Skin with normal turgor, texture and tone b/l  Toenails 1-5 b/l discolored, thick, dystrophic with subungual debris and pain with palpation to nailbeds due to thickness of nails.  Musculoskeletal: Muscle strength 5/5 to all LE muscle groups  No gross bony deformities b/l.  No pain, crepitus or joint limitation noted with ROM.   Neurological: Sensation intact with 10 gram monofilament. Vibratory sensation intact.  Assessment: Painful onychomycosis toenails 1-5 b/l   Plan: 1. Toenails 1-5 b/l were debrided in length and girth without iatrogenic bleeding. 2. Patient to continue soft, supportive shoe gear. 3. Patient to report any pedal injuries to medical professional  immediately. 4. Follow up 3 months.  5. Patient/POA to call should there be a concern in the interim.

## 2018-08-16 DIAGNOSIS — Z85828 Personal history of other malignant neoplasm of skin: Secondary | ICD-10-CM | POA: Diagnosis not present

## 2018-08-16 DIAGNOSIS — L570 Actinic keratosis: Secondary | ICD-10-CM | POA: Diagnosis not present

## 2018-08-16 DIAGNOSIS — L821 Other seborrheic keratosis: Secondary | ICD-10-CM | POA: Diagnosis not present

## 2018-08-18 ENCOUNTER — Other Ambulatory Visit: Payer: Self-pay | Admitting: Urology

## 2018-08-23 ENCOUNTER — Encounter (HOSPITAL_COMMUNITY): Payer: Self-pay

## 2018-08-23 NOTE — Patient Instructions (Signed)
Your procedure is scheduled on: Monday, August 29, 2018   Surgery Time:  9:56AM-10:45AM   Report to [place]  Entrance    Report to admitting at 7:45 AM   Call this number if you have problems the morning of surgery [phone]   Do not eat food or drink liquids :After Midnight.   Brush your teeth the morning of surgery.   Do NOT smoke after Midnight   Take these medicines the morning of surgery with A SIP OF WATER: Dutasteride (Avodart), Memantine (Namenda)  DO NOT TAKE ANY DIABETIC MEDICATIONS DAY OF YOUR SURGERY                               You may not have any metal on your body including  jewelry, and body piercings             Do not wear lotions, powders, perfumes/cologne, or deodorant                          Men may shave face and neck.   Do not bring valuables to the hospital. [place].   Contacts, dentures or bridgework may not be worn into surgery.    Patients discharged the day of surgery will not be allowed to drive home.   Special Instructions: Bring a copy of your healthcare power of attorney and living will documents         the day of surgery if you haven't scanned them in before.              Please read over the following fact sheets you were given:  [facility] - Preparing for Surgery Before surgery, you can play an important role.  Because skin is not sterile, your skin needs to be as free of germs as possible.  You can reduce the number of germs on your skin by washing with CHG (chlorahexidine gluconate) soap before surgery.  CHG is an antiseptic cleaner which kills germs and bonds with the skin to continue killing germs even after washing. Please DO NOT use if you have an allergy to CHG or antibacterial soaps.  If your skin becomes reddened/irritated stop using the CHG and inform your nurse when you arrive at Short Stay. Do not shave (including legs and underarms) for at least 48  hours prior to the first CHG shower.  You may shave your face/neck.  Please follow these instructions carefully:  1.  Shower with CHG Soap the night before surgery and the  morning of surgery.  2.  If you choose to wash your hair, wash your hair first as usual with your normal  shampoo.  3.  After you shampoo, rinse your hair and body thoroughly to remove the shampoo.                             4.  Use CHG as you would any other liquid soap.  You can apply chg directly to the skin and wash.  Gently with a scrungie or clean washcloth.  5.  Apply the CHG Soap to your body ONLY FROM THE NECK DOWN.   Do   not use on face/ open  Wound or open sores. Avoid contact with eyes, ears mouth and   genitals (private parts).                       Wash face,  Genitals (private parts) with your normal soap.             6.  Wash thoroughly, paying special attention to the area where your    surgery  will be performed.  7.  Thoroughly rinse your body with warm water from the neck down.  8.  DO NOT shower/wash with your normal soap after using and rinsing off the CHG Soap.                9.  Pat yourself dry with a clean towel.            10.  Wear clean pajamas.            11.  Place clean sheets on your bed the night of your first shower and do not  sleep with pets. Day of Surgery : Do not apply any lotions/deodorants the morning of surgery.  Please wear clean clothes to the hospital/surgery center.  FAILURE TO FOLLOW THESE INSTRUCTIONS MAY RESULT IN THE CANCELLATION OF YOUR SURGERY  PATIENT SIGNATURE_________________________________  NURSE SIGNATURE__________________________________  ________________________________________________________________________

## 2018-08-25 ENCOUNTER — Encounter (HOSPITAL_COMMUNITY): Payer: Self-pay

## 2018-08-25 ENCOUNTER — Other Ambulatory Visit: Payer: Self-pay

## 2018-08-25 ENCOUNTER — Encounter (HOSPITAL_COMMUNITY)
Admission: RE | Admit: 2018-08-25 | Discharge: 2018-08-25 | Disposition: A | Discharge status: Home / Self Care | Payer: Medicare Other | Source: Ambulatory Visit | Attending: Urology | Admitting: Urology

## 2018-08-25 DIAGNOSIS — R9431 Abnormal electrocardiogram [ECG] [EKG]: Secondary | ICD-10-CM | POA: Diagnosis not present

## 2018-08-25 DIAGNOSIS — I444 Left anterior fascicular block: Secondary | ICD-10-CM | POA: Insufficient documentation

## 2018-08-25 DIAGNOSIS — Z01818 Encounter for other preprocedural examination: Secondary | ICD-10-CM | POA: Diagnosis not present

## 2018-08-25 DIAGNOSIS — D494 Neoplasm of unspecified behavior of bladder: Secondary | ICD-10-CM | POA: Insufficient documentation

## 2018-08-25 HISTORY — DX: Dementia in other diseases classified elsewhere, unspecified severity, without behavioral disturbance, psychotic disturbance, mood disturbance, and anxiety: F02.80

## 2018-08-25 HISTORY — DX: Disorders of diaphragm: J98.6

## 2018-08-25 HISTORY — DX: Personal history of other diseases of the digestive system: Z87.19

## 2018-08-25 HISTORY — DX: Alzheimer's disease, unspecified: G30.9

## 2018-08-25 HISTORY — DX: Personal history of urinary calculi: Z87.442

## 2018-08-25 HISTORY — DX: Unspecified disturbances of skin sensation: R20.9

## 2018-08-25 HISTORY — DX: Unspecified hearing loss, unspecified ear: H91.90

## 2018-08-25 HISTORY — DX: Other nonspecific abnormal finding of lung field: R91.8

## 2018-08-25 LAB — BASIC METABOLIC PANEL
Anion gap: 14 (ref 5–15)
BUN: 24 mg/dL — ABNORMAL HIGH (ref 8–23)
CO2: 25 mmol/L (ref 22–32)
Calcium: 9.3 mg/dL (ref 8.9–10.3)
Chloride: 99 mmol/L (ref 98–111)
Creatinine, Ser: 1 mg/dL (ref 0.61–1.24)
GFR calc Af Amer: >60 mL/min (ref >60)
GFR calc non Af Amer: >60 mL/min (ref >60)
Glucose, Bld: 215 mg/dL — ABNORMAL HIGH (ref 70–99)
POTASSIUM: 3.9 mmol/L (ref 3.5–5.1)
SODIUM: 138 mmol/L (ref 135–145)

## 2018-08-25 LAB — GLUCOSE, CAPILLARY: Glucose-Capillary: 212 mg/dL — ABNORMAL HIGH (ref 70–99)

## 2018-08-25 LAB — CBC
HCT: 47 % (ref 39.0–52.0)
Hemoglobin: 15.2 g/dL (ref 13.0–17.0)
MCH: 28 pg (ref 26.0–34.0)
MCHC: 32.3 g/dL (ref 30.0–36.0)
MCV: 86.7 fL (ref 80.0–100.0)
Platelets: 300 10*3/uL (ref 150–400)
RBC: 5.42 MIL/uL (ref 4.22–5.81)
RDW: 11.9 % (ref 11.5–15.5)
WBC: 11.8 10*3/uL — ABNORMAL HIGH (ref 4.0–10.5)
nRBC: 0 % (ref 0.0–0.2)

## 2018-08-25 LAB — HEMOGLOBIN A1C
HEMOGLOBIN A1C: 7.4 % — AB (ref 4.8–5.6)
MEAN PLASMA GLUCOSE: 165.68 mg/dL

## 2018-08-25 NOTE — Pre-Procedure Instructions (Signed)
CBC, BMP, Hgb A1C results 08/25/2018 sent to Dr. Alinda Money via epic.

## 2018-08-26 NOTE — H&P (Signed)
Office Visit Report     08/10/2018   --------------------------------------------------------------------------------   [name] [name]  MRN: [id number]  PRIMARY CARE:  Arleta Creek, MD  DOB: 1941-01-26, 78 year old Male  REFERRING:  W Gareth Eagle, MD  SSN: [id number]  PROVIDER:  Raynelle Bring, M.D.    LOCATION:  [facility] [phone]   --------------------------------------------------------------------------------   CC/HPI: Bladder cancer   [name] [name] follows up today for continued surveillance of his bladder cancer. At his last visit in October, he had a negative cystoscopy and elected to proceed with observation and hold off on any additional maintenance chemotherapy. He has denied any hematuria or new voiding complaints. He has otherwise remained in stable overall health. According to his wife, his dementia has been relatively stable compared to October.     ALLERGIES: Aricept Nitrofurantoin Monohyd Macro CAPS Sulfa Drugs Tetracyclines    MEDICATIONS: Metoprolol Succinate 25 mg tablet, extended release 24 hr  Avodart 0.5 mg capsule 0 Oral  Fish Oil CAPS Oral  Glipizide  Hydrochlorothiazide 25 mg tablet Oral  MetFORMIN HCl - 500 MG Oral Tablet Oral  Namenda Xr 28 mg capsule sprinkle, extended release 24 hr Oral  Onglyza 5 mg tablet Oral  Perindopril Erbumine 4 MG Oral Tablet Oral  Vitamin B-12 100 mcg tablet Oral     GU PSH: Bladder Instill AntiCA Agent - 2016 Catheterize For Residual - 03/23/2018 Cysto Bladder Ureth Biopsy - 10/19/2016 Cysto Uretero Lithotripsy - 2010 Cystoscopy - 03/23/2018, 09/24/2017, 05/21/2017, 02/19/2017, 09/14/2016 Cystoscopy Fulguration - 2010 Cystoscopy Insert Stent - 2015, 2010, 2010, 2008 Cystoscopy TURBT <2 cm - 11/11/2017, 09/28/2016, 03/02/2016, 2015, 2013 Cystoscopy TURBT 2-5 cm - 2017, 2016, 2016 Cystoscopy Ureteroscopy, Left - 09/28/2016, 2015 Percut Stone Removal >2cm - 2015 Ureteroscopic stone removal - 2008      PSH  Notes: Cystoscopy With Fulguration Medium Lesion (2-5cm), Cystoscopy With Fulguration Medium Lesion (2-5cm), Bladder Injection Of Cancer Treatment, Percutaneous Lithotomy For Stone Over 2cm., Cystoscopy With Insertion Of Ureteral Stent Left, Cystoscopy With Ureteroscopy Left, Cystoscopy With Fulguration Small Lesion (5-63mm), Cystoscopy With Fulguration Small Lesion (5-63mm), Cystoscopy With Pyeloscopy With Lithotripsy, Cystoscopy With Insertion Of Ureteral Stent Right, Cystoscopy With Fulguration, Cystoscopy With Insertion Of Ureteral Stent Right, Cystoscopy With Ureteroscopy With Removal Of Calculus, Cystoscopy With Insertion Of Ureteral Stent Left   NON-GU PSH: None   GU PMH: Bladder Cancer overlapping sites - 2017, Malignant neoplasm of overlapping sites of bladder, - 2017 Urinary Urgency, Urinary urgency - 2016 BPH w/LUTS, Benign prostatic hyperplasia (BPH) with straining on urination - 2016 Renal calculus, Nephrolithiasis - 2016      PMH Notes:   [name] [name] is a 78 year old previously followed by Dr. Tresa Endo. 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 He was noted to have a recurrence in 2013 and underwent TURBT in July 2013. He was also incidentally noted to have a stricture of the distal left ureter on RPG studies at that time and a brush biopsy was performed. He was found to have high grade Ta urothelial carcinoma in February 2016.    Oct 2007: TURBT, Low grade Ta  Jul 2013: TURBT (right bladder neck 1.5 cm tumor, low grade Ta),brush biopsy of distal left ureteral stricture (no malignancy)  Mar 2015: TURBT - Low grade Ta  Feb 2016: TURBT - High  grade Ta, [facility]  Mar-Apr 2016: Induction BCG  Jul 2016: Maintenance BCG  Oct 2016: Multiple recurrent tumors noted on surveillance cystoscopy  Dec 2016: TUR - Multiple tumors over right posterior  bladder - High grade, Ta  Dec 2016 - Feb 2017: 6 week induction BCG  May 2017: TURBT - High grade, Ta urothelial carcinoma (two tumors)  Oct 2017: Completed induction 6 week course of intravesical gemcitabine [facility] - Dr. Ricky Ala)  Apr 2018: Two small recurrences (each < 1 cm)  Apr 2018: TURBT- High grade Ta, concern for possible lamina propria invasion for tumor at dome  May 2018: Repeat TURBT - No residual malignancy  Jul-Aug 2018: Repeat induction 6 week course of intravesical gemcitabine [facility]- Dr. Ricky Ala)  Sep 2018 - Apr 2019: Monthly maintenance gemcitabine  May 2019: Blue light cystoscopy with TURBT for 2 recurrent tumors posteriorly - High grade, Ta  Jun-Jul 2019: Repeat induction intravesical gemcitabine    2) Nephrolithiasis: He has a long standing history of calcium oxalate stones. He was incidentally noted to have a large partial staghorn calculus during retrograde pyelography in February 2015.  Apr 2015: L PCNL   3) Prostate cancer screening: We discontinued screening in 2014 due to his low risk for prostate cancer at that time and progressive dementia.  Last PSA: 0.35 (June 2014)   4) BPH/LUTS: He is treated with Avodart.     NON-GU PMH: Bacteriuria (Stable) - 02/19/2017, (Stable), - 10/14/2016, - 09/09/2016 Diabetes Type 2 Hypertension Unspecified dementia without behavioral disturbance    FAMILY HISTORY: No pertinent family history - Other Prostate Cancer - No Family History   SOCIAL HISTORY: Marital Status: Married Preferred Language: English; Ethnicity: Not Hispanic Or Latino; Race: White Current Smoking Status: Patient has never smoked.  Has never drank.  Drinks 2 caffeinated drinks per day. Has not had a blood transfusion.     Notes: Never A Smoker   REVIEW OF SYSTEMS:    GU Review Male:   Patient denies frequent urination, hard to postpone urination, burning/ pain with urination, get up at night to urinate, leakage of urine, stream starts and stops, trouble  starting your streams, and have to strain to urinate .  Gastrointestinal (Upper):   Patient denies nausea and vomiting.  Gastrointestinal (Lower):   Patient denies diarrhea and constipation.  Constitutional:   Patient denies fever, night sweats, weight loss, and fatigue.  Skin:   Patient denies skin rash/ lesion and itching.  Eyes:   Patient denies blurred vision and double vision.  Ears/ Nose/ Throat:   Patient denies sore throat and sinus problems.  Hematologic/Lymphatic:   Patient denies swollen glands and easy bruising.  Cardiovascular:   Patient denies leg swelling and chest pains.  Respiratory:   Patient denies cough and shortness of breath.  Endocrine:   Patient denies excessive thirst.  Musculoskeletal:   Patient denies back pain and joint pain.  Neurological:   Patient denies headaches and dizziness.  Psychologic:   Patient denies depression and anxiety.   VITAL SIGNS:      08/10/2018 09:03 AM  Weight 200 lb / 90.72 kg  Height 70 in / 177.8 cm  BP 155/82 mmHg  Pulse 91 /min  BMI 28.7 kg/m   GU PHYSICAL EXAMINATION:    Urethral Meatus: Normal size. No lesion, no wart, no discharge, no polyp. Normal location.   MULTI-SYSTEM PHYSICAL EXAMINATION:    Constitutional: Well-nourished. No physical deformities. Normally developed. Good grooming.  Respiratory: No labored breathing, no use of accessory  muscles.   Cardiovascular: Normal temperature, normal extremity pulses, no swelling, no varicosities.     PAST DATA REVIEWED:  Source Of History:  Patient  Urine Test Review:   Urinalysis   11/23/12 11/18/11 11/28/10 01/08/10 12/20/03  PSA  Total PSA 0.35  0.42  0.46  0.49  2.42     PROCEDURES:         Flexible Cystoscopy - 52000  Indication: Bladder cancer Risks, benefits, and potential complications of the procedure were discussed with the patient including infection, bleeding, voiding discomfort, urinary retention, fever, chills, sepsis, and others. All questions were  answered. Informed consent was obtained. Sterile technique and intraurethral analgesia were used.  Meatus:  Normal size. Normal location. Normal condition.  Urethra:  No strictures.  External Sphincter:  Normal.  Verumontanum:  Normal.  Prostate:  Non-obstructing. No hyperplasia.  Bladder Neck:  Non-obstructing.  Ureteral Orifices:  Normal location. Normal size. Normal shape. Effluxed clear urine.  Bladder:  Inspection of the bladder did reveal some subtle frondular areas just beyond the left ureteral orifice extending posteriorly. There also did appear to be a very small papillary tumor measuring approximately 0.5 cm. This entire area encompassed approximately 1.5 cm area just beyond the left hemi trigone. A bladder washing was obtained for cytology.      Chaperone: AJ The procedure was well-tolerated and without complications. Instructions were given to call the office immediately if questions or problems.         Urinalysis Dipstick Dipstick Cont'd  Color: Yellow Bilirubin: Neg mg/dL  Appearance: Clear Ketones: Trace mg/dL  Specific Gravity: 1.020 Blood: Neg ery/uL  pH: 6.0 Protein: Neg mg/dL  Glucose: Neg mg/dL Urobilinogen: 0.2 mg/dL    Nitrites: Neg    Leukocyte Esterase: Neg leu/uL    ASSESSMENT:      ICD-10 Details  1 GU:   Bladder Cancer overlapping sites - C67.8    PLAN:           Orders Labs Urine Cytology          Schedule Return Visit/Planned Activity: Other See Visit Notes             Note: Will schedule for surgery.          Document Letter(s):  Created for Patient: Clinical Summary         Notes:   1. High risk, non muscle invasive bladder cancer: On cystoscopy, he does have findings that suggest an early recurrence. A bladder washing was obtained for cytology. We reviewed options and I recommended that he consider proceeding to the operating room for biopsy and at least fulguration of this area versus resection with postoperative intravesical gemcitabine.  I reviewed our goals with him and his wife today. His wife is in agreement is he is not felt to be a great candidate for cystectomy due to his significant dementia. Unfortunately, the CODEX trial is closed. They agreed to proceed and will be scheduled for cystoscopy and transurethral resection of bladder tumor with fulguration. The potential risks, complications, and the expected recovery process were discussed. Informed consent has been obtained.   Cc: Dr. Gareth Eagle  Dr. Carla Drape    * Signed by Raynelle Bring, M.D. on 08/10/18 at 4:06 PM (EST)*

## 2018-08-29 ENCOUNTER — Ambulatory Visit (HOSPITAL_COMMUNITY): Payer: Medicare Other | Admitting: Physician Assistant

## 2018-08-29 ENCOUNTER — Ambulatory Visit (HOSPITAL_COMMUNITY)
Admission: RE | Admit: 2018-08-29 | Discharge: 2018-08-29 | Disposition: A | Discharge status: Home / Self Care | Payer: Medicare Other | Attending: Urology | Admitting: Urology

## 2018-08-29 ENCOUNTER — Ambulatory Visit (HOSPITAL_COMMUNITY): Payer: Medicare Other | Admitting: Anesthesiology

## 2018-08-29 ENCOUNTER — Ambulatory Visit (HOSPITAL_COMMUNITY): Payer: Medicare Other

## 2018-08-29 ENCOUNTER — Encounter (HOSPITAL_COMMUNITY): Payer: Self-pay | Admitting: Emergency Medicine

## 2018-08-29 ENCOUNTER — Other Ambulatory Visit: Payer: Self-pay

## 2018-08-29 ENCOUNTER — Encounter (HOSPITAL_COMMUNITY)
Admission: RE | Disposition: A | Discharge status: Home / Self Care | Payer: Self-pay | Source: Home / Self Care | Attending: Urology

## 2018-08-29 DIAGNOSIS — C679 Malignant neoplasm of bladder, unspecified: Secondary | ICD-10-CM | POA: Diagnosis not present

## 2018-08-29 DIAGNOSIS — Z881 Allergy status to other antibiotic agents status: Secondary | ICD-10-CM | POA: Diagnosis not present

## 2018-08-29 DIAGNOSIS — I10 Essential (primary) hypertension: Secondary | ICD-10-CM | POA: Insufficient documentation

## 2018-08-29 DIAGNOSIS — Z8042 Family history of malignant neoplasm of prostate: Secondary | ICD-10-CM | POA: Diagnosis not present

## 2018-08-29 DIAGNOSIS — C674 Malignant neoplasm of posterior wall of bladder: Secondary | ICD-10-CM | POA: Diagnosis not present

## 2018-08-29 DIAGNOSIS — E785 Hyperlipidemia, unspecified: Secondary | ICD-10-CM | POA: Insufficient documentation

## 2018-08-29 DIAGNOSIS — Z7984 Long term (current) use of oral hypoglycemic drugs: Secondary | ICD-10-CM | POA: Diagnosis not present

## 2018-08-29 DIAGNOSIS — E119 Type 2 diabetes mellitus without complications: Secondary | ICD-10-CM | POA: Diagnosis not present

## 2018-08-29 DIAGNOSIS — D494 Neoplasm of unspecified behavior of bladder: Secondary | ICD-10-CM | POA: Diagnosis not present

## 2018-08-29 DIAGNOSIS — Z882 Allergy status to sulfonamides status: Secondary | ICD-10-CM | POA: Diagnosis not present

## 2018-08-29 DIAGNOSIS — Z79899 Other long term (current) drug therapy: Secondary | ICD-10-CM | POA: Diagnosis not present

## 2018-08-29 DIAGNOSIS — G309 Alzheimer's disease, unspecified: Secondary | ICD-10-CM | POA: Diagnosis not present

## 2018-08-29 DIAGNOSIS — I493 Ventricular premature depolarization: Secondary | ICD-10-CM | POA: Insufficient documentation

## 2018-08-29 DIAGNOSIS — N2889 Other specified disorders of kidney and ureter: Secondary | ICD-10-CM | POA: Diagnosis not present

## 2018-08-29 DIAGNOSIS — F0280 Dementia in other diseases classified elsewhere without behavioral disturbance: Secondary | ICD-10-CM | POA: Diagnosis not present

## 2018-08-29 DIAGNOSIS — C670 Malignant neoplasm of trigone of bladder: Secondary | ICD-10-CM | POA: Diagnosis not present

## 2018-08-29 DIAGNOSIS — Z888 Allergy status to other drugs, medicaments and biological substances status: Secondary | ICD-10-CM | POA: Diagnosis not present

## 2018-08-29 HISTORY — PX: CYSTOSCOPY WITH BIOPSY: SHX5122

## 2018-08-29 LAB — GLUCOSE, CAPILLARY: Glucose-Capillary: 273 mg/dL — ABNORMAL HIGH (ref 70–99)

## 2018-08-29 SURGERY — CYSTOSCOPY, WITH BIOPSY
Anesthesia: General

## 2018-08-29 MED ORDER — ONDANSETRON HCL 4 MG/2ML IJ SOLN
INTRAMUSCULAR | Status: DC | PRN
  Administered 2018-08-29: 4 mg via INTRAVENOUS

## 2018-08-29 MED ORDER — FENTANYL CITRATE (PF) 100 MCG/2ML IJ SOLN
INTRAMUSCULAR | Status: DC | PRN
  Administered 2018-08-29: 25 ug via INTRAVENOUS
  Administered 2018-08-29: 50 ug via INTRAVENOUS

## 2018-08-29 MED ORDER — OXYCODONE HCL 5 MG/5ML PO SOLN: 5.0000 mg | Freq: Once | ORAL | Status: DC | PRN

## 2018-08-29 MED ORDER — OXYCODONE HCL 5 MG PO TABS: 5.0000 mg | ORAL_TABLET | Freq: Once | ORAL | Status: DC | PRN

## 2018-08-29 MED ORDER — HYDROMORPHONE HCL 1 MG/ML IJ SOLN: 0.2500 mg | INTRAMUSCULAR | Status: DC | PRN

## 2018-08-29 MED ORDER — IOHEXOL 300 MG/ML  SOLN
INTRAMUSCULAR | Status: DC | PRN
  Administered 2018-08-29: 10 mL via URETHRAL

## 2018-08-29 MED ORDER — FENTANYL CITRATE (PF) 100 MCG/2ML IJ SOLN
INTRAMUSCULAR | Status: AC
  Filled 2018-08-29: qty 2

## 2018-08-29 MED ORDER — PROPOFOL 10 MG/ML IV BOLUS
INTRAVENOUS | Status: DC | PRN
  Administered 2018-08-29: 20 mg via INTRAVENOUS
  Administered 2018-08-29: 100 mg via INTRAVENOUS

## 2018-08-29 MED ORDER — LIDOCAINE 2% (20 MG/ML) 5 ML SYRINGE
INTRAMUSCULAR | Status: DC | PRN
  Administered 2018-08-29: 60 mg via INTRAVENOUS

## 2018-08-29 MED ORDER — LIDOCAINE 2% (20 MG/ML) 5 ML SYRINGE
INTRAMUSCULAR | Status: AC
  Filled 2018-08-29: qty 5

## 2018-08-29 MED ORDER — PHENYLEPHRINE HCL 10 MG/ML IJ SOLN
INTRAMUSCULAR | Status: DC | PRN
  Administered 2018-08-29 (×3): 80 ug via INTRAVENOUS

## 2018-08-29 MED ORDER — CEFAZOLIN SODIUM-DEXTROSE 2-4 GM/100ML-% IV SOLN
2.0000 g | Freq: Once | INTRAVENOUS | Status: AC
  Administered 2018-08-29: 2 g via INTRAVENOUS
  Filled 2018-08-29: qty 100

## 2018-08-29 MED ORDER — ONDANSETRON HCL 4 MG/2ML IJ SOLN
INTRAMUSCULAR | Status: AC
  Filled 2018-08-29: qty 2

## 2018-08-29 MED ORDER — PHENYLEPHRINE 40 MCG/ML (10ML) SYRINGE FOR IV PUSH (FOR BLOOD PRESSURE SUPPORT)
PREFILLED_SYRINGE | INTRAVENOUS | Status: AC
  Filled 2018-08-29: qty 10

## 2018-08-29 MED ORDER — PROMETHAZINE HCL 25 MG/ML IJ SOLN: 6.2500 mg | INTRAMUSCULAR | Status: DC | PRN

## 2018-08-29 MED ORDER — SODIUM CHLORIDE 0.9 % IR SOLN
Status: DC | PRN
  Administered 2018-08-29: 6000 mL via INTRAVESICAL

## 2018-08-29 MED ORDER — LACTATED RINGERS IV SOLN
INTRAVENOUS | Status: DC
  Administered 2018-08-29: 08:00:00 via INTRAVENOUS

## 2018-08-29 MED ORDER — 0.9 % SODIUM CHLORIDE (POUR BTL) OPTIME
TOPICAL | Status: DC | PRN
  Administered 2018-08-29: 1000 mL

## 2018-08-29 MED ORDER — PROPOFOL 10 MG/ML IV BOLUS
INTRAVENOUS | Status: AC
  Filled 2018-08-29: qty 20

## 2018-08-29 SURGICAL SUPPLY — 18 items
BAG URINE DRAINAGE (UROLOGICAL SUPPLIES) IMPLANT
BAG URO CATCHER STRL LF (MISCELLANEOUS) ×3 IMPLANT
CATH INTERMIT  6FR 70CM (CATHETERS) ×3 IMPLANT
COVER WAND RF STERILE (DRAPES) IMPLANT
ELECT REM PT RETURN 15FT ADLT (MISCELLANEOUS) ×3 IMPLANT
GLOVE BIOGEL M STRL SZ7.5 (GLOVE) ×6 IMPLANT
GOWN STRL REUS W/TWL LRG LVL3 (GOWN DISPOSABLE) ×6 IMPLANT
GUIDEWIRE STR DUAL SENSOR (WIRE) ×3 IMPLANT
KIT TURNOVER KIT A (KITS) IMPLANT
LOOP CUT BIPOLAR 24F LRG (ELECTROSURGICAL) ×3 IMPLANT
MANIFOLD NEPTUNE II (INSTRUMENTS) ×3 IMPLANT
PACK CYSTO (CUSTOM PROCEDURE TRAY) ×3 IMPLANT
SET ASPIRATION TUBING (TUBING) IMPLANT
STENT CONTOUR 6FRX26X.038 (STENTS) ×3 IMPLANT
SYRINGE IRR TOOMEY STRL 70CC (SYRINGE) ×3 IMPLANT
TUBING CONNECTING 10 (TUBING) ×2 IMPLANT
TUBING CONNECTING 10' (TUBING) ×1
TUBING UROLOGY SET (TUBING) ×3 IMPLANT

## 2018-08-29 NOTE — Op Note (Signed)
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  Cystoscopy 2.  Pelvic exam under anesthesia 3.  Transurethral resection of bladder tumor (1.5 cm) 4.  Right ureteroscopy 5.  Right ureteral stent placement (6 x 26 -no string) 6.  Right retrograde pyelography with interpretation  Surgeon: Pryor Curia MD  Anesthesia: General  Complications: None  EBL: Minimal  Intraoperative findings: Right retrograde pyelography was performed with a 6 French ureteral catheter and Omnipaque contrast and revealed findings consistent with a possible distal tumor versus stricture.  There was some dilation of the ureter proximal to this point but no intraluminal filling defects or evidence of hydronephrosis.  Specimens: 1.  Posterior bladder biopsies 2.  Right hemitrigone tumor  Disposition of specimen: Pathology  Indication: [name] [name] is a 78 year old gentleman with a history of recurrent, high-grade urothelial carcinoma of the bladder.  He has previously been noted to have BCG refractory disease and has been treated with intravesical chemotherapy.  Unfortunately, he recently was noted to have a recurrence on office cystoscopy with a tumor located toward the right hemitrigone and additional small papillary abnormalities over the posterior bladder.  After reviewing options for treatment, it was recommended to proceed with the above procedures.  The potential risks, complications, and the expected recovery process was discussed in detail with [name] [name] and his wife.  Informed consent was obtained.  Description of procedure: The patient was taken to the operating room and a general anesthetic was administered.  He was given preoperative antibiotics, placed in the dorsolithotomy position, and prepped and draped in the usual sterile fashion.  Next, a preoperative timeout was performed.  Cystourethroscopy was then performed  with a 22 French sheath and the bladder was examined with both a 30 degree and 70 degree lens.  This revealed findings as described above.  The right hemitrigone tumor was located just over the right ureteral orifice although this was effluxing clear urine.  The posterior mucosal abnormalities were identified and appear to be consistent with small papillary tumors.  Using a rigid biopsy forceps, multiple small biopsies were obtained to remove some of the small tumors.  Attention then turned to the right ureteral orifice and a 6 French ureteral catheter was used to intubate the right ureteral orifice.  Omnipaque contrast was then injected and revealed an apple core appearance of the most distal, intravesical right ureter with dilation above this area but without evidence of hydronephrosis or renal collecting system dilation.  No filling defects were located above the most distal aspect of the ureter.  A 0.38 sensor guidewire was then advanced up into the right renal pelvis.  Semirigid ureteroscopy was then attempted to be performed.  The distal ureter was able to be entered.  However, the aforementioned narrowing was unable to be bypassed and was consistent with possible intraureteral tumor although this was not definitive.  It was decided to place a ureteral stent and that this will be further evaluated at a later date so as not to confuse whether there was a ureteral versus bladder tumor considering the likelihood of contamination due to the tumor at the ureteral orifice.  The wire was then backloaded on the cystoscope and a 6 x 26 double-J ureteral stent was advanced over the wire using Seldinger technique.  The wire was removed with a good curl noted in the renal pelvis as well as within the bladder.  The 26 French resectoscope sheath was then inserted with  a visual obturator.  Using a bipolar cutting loop, the right hemitrigone tumor was then resected with resection also been performed of the distal right  intravesical ureter to remove any obvious tumor that was grossly identified.  This was sent as a separate specimen from the posterior bladder tumors.  Hemostasis was achieved with bipolar cautery.  The bladder was emptied and reinspected and hemostasis appeared excellent.  The patient was able to be awakened and transferred to the recovery unit in satisfactory condition.  Although we had discussed possibly administering intravesical chemotherapy after his procedure, due to the fact that he did undergo ureteral stent placement, we elected to withhold intravesical chemotherapy at this time.

## 2018-08-29 NOTE — Anesthesia Postprocedure Evaluation (Signed)
Anesthesia Post Note  Patient: [name] [name]  Procedure(s) Performed: CYSTOSCOPY WITH BIOPSY OF BLADDER TUMOR/ TRANSURETHRAL RESECTION OF BLADDER TUMOR/ RIGHT RETROGRADE PYELOGRAM/RIGHT STENT PLACEMENT (N/A )     Patient location during evaluation: PACU Anesthesia Type: General Level of consciousness: awake and alert Pain management: pain level controlled Vital Signs Assessment: post-procedure vital signs reviewed and stable Respiratory status: spontaneous breathing, nonlabored ventilation and respiratory function stable Cardiovascular status: blood pressure returned to baseline and stable Postop Assessment: no apparent nausea or vomiting Anesthetic complications: no    Last Vitals:  Vitals:   08/29/18 1030 08/29/18 1045  BP: 123/72 126/84  Pulse: 83 86  Resp: 13 16  Temp:  36.9 C  SpO2: 100% 97%    Last Pain:  Vitals:   08/29/18 1045  TempSrc:   PainSc: 0-No pain                 Lynda Rainwater

## 2018-08-29 NOTE — Transfer of Care (Signed)
Immediate Anesthesia Transfer of Care Note  Patient: [name] [name]  Procedure(s) Performed: CYSTOSCOPY WITH BIOPSY OF BLADDER TUMOR/ TRANSURETHRAL RESECTION OF BLADDER TUMOR/ RIGHT RETROGRADE PYELOGRAM/RIGHT STENT PLACEMENT (N/A )  Patient Location: PACU  Anesthesia Type:General  Level of Consciousness: drowsy, patient cooperative and responds to stimulation  Airway & Oxygen Therapy: Patient Spontanous Breathing and Patient connected to face mask oxygen  Post-op Assessment: Report given to RN and Post -op Vital signs reviewed and stable  Post vital signs: Reviewed and stable  Last Vitals:  Vitals Value Taken Time  BP 128/79 08/29/2018 10:23 AM  Temp    Pulse 87 08/29/2018 10:23 AM  Resp 13 08/29/2018 10:23 AM  SpO2 100 % 08/29/2018 10:23 AM    Last Pain:  Vitals:   08/29/18 0749  TempSrc:   PainSc: 0-No pain      Patients Stated Pain Goal: 4 (31/49/70 2637)  Complications: No apparent anesthesia complications

## 2018-08-29 NOTE — Interval H&P Note (Signed)
History and Physical Interval Note:  08/29/2018 9:00 AM  [name] [name]  has presented today for surgery, with the diagnosis of BLADDER TUMOR.  The various methods of treatment have been discussed with the patient and family. After consideration of risks, benefits and other options for treatment, the patient has consented to  Procedure(s): CYSTOSCOPY WITH BIOPSY OF BLADDER TUMOR VERSUS TRANSURETHRAL RESECTION OF BLADDER TUMOR (N/A) as a surgical intervention.  The patient's history has been reviewed, patient examined, no change in status, stable for surgery.  I have reviewed the patient's chart and labs.  Questions were answered to the patient's satisfaction.     [employer]

## 2018-08-29 NOTE — Discharge Instructions (Signed)
1. You may see some blood in the urine and may have some burning with urination for 48-72 hours. You also may notice that you have to urinate more frequently or urgently after your procedure which is normal.  °2. You should call should you develop an inability urinate, fever > 101, persistent nausea and vomiting that prevents you from eating or drinking to stay hydrated.  °

## 2018-08-29 NOTE — Progress Notes (Signed)
Dr. Sabra Heck notified that pt [name] [name] is 89 today. No new orders given.

## 2018-08-29 NOTE — Anesthesia Procedure Notes (Signed)
Procedure Name: LMA Insertion Date/Time: 08/29/2018 9:13 AM Performed by: Glory Buff, CRNA Pre-anesthesia Checklist: Patient identified, Emergency Drugs available, Suction available and Patient being monitored Patient Re-evaluated:Patient Re-evaluated prior to induction Oxygen Delivery Method: Circle system utilized Preoxygenation: Pre-oxygenation with 100% oxygen Induction Type: IV induction LMA: LMA inserted LMA Size: 4.0 Number of attempts: 1 Placement Confirmation: positive ETCO2 Tube secured with: Tape Dental Injury: Teeth and Oropharynx as per pre-operative assessment

## 2018-08-29 NOTE — Anesthesia Preprocedure Evaluation (Addendum)
Anesthesia Evaluation  Patient identified by MRN, date of birth, ID band  Reviewed: Allergy & Precautions, NPO status , Patient's Chart, lab work & pertinent test results, reviewed documented beta blocker date and time   Airway Mallampati: III  TM Distance: >3 FB Neck ROM: Full    Dental  (+) Dental Advisory Given   Pulmonary neg pulmonary ROS,    Pulmonary exam normal breath sounds clear to auscultation       Cardiovascular hypertension, Pt. on home beta blockers Normal cardiovascular exam Rhythm:Regular Rate:Normal  ECG: ST, rate 103, freq PVC's   Neuro/Psych PSYCHIATRIC DISORDERS Dementia Alzheimer's disease    GI/Hepatic negative GI ROS, Neg liver ROS,   Endo/Other  diabetes, Oral Hypoglycemic Agents  Renal/GU Renal disease     Musculoskeletal negative musculoskeletal ROS (+)   Abdominal   Peds  Hematology HLD   Anesthesia Other Findings BLADDER CANCER  Reproductive/Obstetrics                            Anesthesia Physical  Anesthesia Plan  ASA: III  Anesthesia Plan: General   Post-op Pain Management:    Induction: Intravenous  PONV Risk Score and Plan: 2 and Ondansetron, Treatment may vary due to age or medical condition and Midazolam  Airway Management Planned: LMA  Additional Equipment: None  Intra-op Plan:   Post-operative Plan: Extubation in OR  Informed Consent: I have reviewed the patients History and Physical, chart, labs and discussed the procedure including the risks, benefits and alternatives for the proposed anesthesia with the patient or authorized representative who has indicated his/her understanding and acceptance.     Dental advisory given  Plan Discussed with: CRNA  Anesthesia Plan Comments:        Anesthesia Quick Evaluation

## 2018-08-30 ENCOUNTER — Encounter (HOSPITAL_COMMUNITY): Payer: Self-pay | Admitting: Urology

## 2018-09-02 DIAGNOSIS — R8271 Bacteriuria: Secondary | ICD-10-CM | POA: Diagnosis not present

## 2018-09-02 DIAGNOSIS — C678 Malignant neoplasm of overlapping sites of bladder: Secondary | ICD-10-CM | POA: Diagnosis not present

## 2018-09-07 DIAGNOSIS — H34811 Central retinal vein occlusion, right eye, with macular edema: Secondary | ICD-10-CM | POA: Diagnosis not present

## 2018-09-20 DIAGNOSIS — R350 Frequency of micturition: Secondary | ICD-10-CM | POA: Diagnosis not present

## 2018-09-20 DIAGNOSIS — R8271 Bacteriuria: Secondary | ICD-10-CM | POA: Diagnosis not present

## 2018-09-20 DIAGNOSIS — N131 Hydronephrosis with ureteral stricture, not elsewhere classified: Secondary | ICD-10-CM | POA: Diagnosis not present

## 2018-09-20 DIAGNOSIS — C678 Malignant neoplasm of overlapping sites of bladder: Secondary | ICD-10-CM | POA: Diagnosis not present

## 2018-09-21 DIAGNOSIS — J9811 Atelectasis: Secondary | ICD-10-CM | POA: Diagnosis not present

## 2018-09-21 DIAGNOSIS — C679 Malignant neoplasm of bladder, unspecified: Secondary | ICD-10-CM | POA: Diagnosis not present

## 2018-09-21 DIAGNOSIS — N132 Hydronephrosis with renal and ureteral calculous obstruction: Secondary | ICD-10-CM | POA: Diagnosis not present

## 2018-09-21 DIAGNOSIS — C678 Malignant neoplasm of overlapping sites of bladder: Secondary | ICD-10-CM | POA: Diagnosis not present

## 2018-09-27 ENCOUNTER — Telehealth: Payer: Self-pay | Admitting: Oncology

## 2018-09-27 NOTE — Telephone Encounter (Signed)
A new patient appt has been scheduled for [name] [name] on 4/22 at 2pm. I cld and spoke to his wife to provide the appt date and time. [name] [name] has alzheimer's, I explained our no visitor policy. Per Mrs. Portman, she will not bring [name] [name] to the cancer center unless she can be in the room with him. I sent a msg to Dr. Alen Blew to see if is willing to do a webex visit.

## 2018-09-28 NOTE — Progress Notes (Signed)
GU Location of Tumor / Histology: muscle invasive bladder cancer  [name] [name] "[name] [name]" [name] was originally diagnosed with bladder cancer in 2007.      Past/Anticipated interventions by urology, if any: In the following order: TURBT, chemo from 2007-07/2014, BCG, repeat BCG, intravesical gemcitabine (2017 at [facility]), repeat intravesical gemcitabine, Maintenance gemcitabine (02/2017 until 09/2017), repeat intravesical gemcitabine, TUR, stent placement.  Past/Anticipated interventions by medical oncology, if any: consult with [facility] 4/22 at 1400  Weight changes, if any: down ten pounds since October 29th.  Bowel/Bladder complaints, if any: Reports daytime urinary frequency. Reports nocturia x 3-4. Denies gross hematuria. Prescribed Myrbetriq 50 s/p stent placement but "it doesn't seems to be helping."  Nausea/Vomiting, if any: no  Pain issues, if any:  no  SAFETY ISSUES:  Prior radiation? no  Pacemaker/ICD? no  Possible current pregnancy? no, male patient  Is the patient on methotrexate? no  Current Complaints / other details:  78 year old male. Married. Has dementia. Ureteral stent change planned for early summer.

## 2018-09-29 ENCOUNTER — Telehealth: Payer: Self-pay | Admitting: Oncology

## 2018-09-29 NOTE — Telephone Encounter (Signed)
Called patient regarding Webex appointment, patient is notified and e-mail was sent.

## 2018-09-30 ENCOUNTER — Ambulatory Visit
Admission: RE | Admit: 2018-09-30 | Discharge: 2018-09-30 | Disposition: A | Discharge status: Home / Self Care | Payer: Medicare Other | Source: Ambulatory Visit | Attending: Radiation Oncology | Admitting: Radiation Oncology

## 2018-09-30 ENCOUNTER — Other Ambulatory Visit: Payer: Self-pay

## 2018-09-30 ENCOUNTER — Encounter: Payer: Self-pay | Admitting: Radiation Oncology

## 2018-09-30 VITALS — Ht 70.0 in | Wt 189.0 lb

## 2018-09-30 DIAGNOSIS — C670 Malignant neoplasm of trigone of bladder: Secondary | ICD-10-CM

## 2018-09-30 HISTORY — DX: Malignant neoplasm of bladder, unspecified: C67.9

## 2018-09-30 NOTE — Progress Notes (Signed)
Radiation Oncology         [phone] ________________________________  Initial Outpatient Consultation - Conducted via telephone due to current COVID-19 concerns for limiting patient exposure  Name: [name] [name] MRN: [id number]  Date: 09/30/2018  DOB: 1941/05/27  [id number], Mauro Kaufmann, MD  Raynelle Bring, MD   REFERRING PHYSICIAN: Raynelle Bring, MD  DIAGNOSIS: 78 y.o. gentleman with muscle invasive urothelial carcinoma of the bladder involving the distal right ureter    ICD-10-CM   1. Malignant neoplasm of trigone of urinary bladder ([place]) C67.0     HISTORY OF PRESENT ILLNESS: [name] [name] is a 78 y.o. male with a history of bladder cancer. He was initially diagnosed with low grade Ta urothelial carcinoma of the bladder in 2007 and was treated with a TURBT and followed closely in observation thereafter with routine surveillance office cystoscopy. He was noted to have a recurrence in 2013 and underwent repeat TURBT in 12/2011 which confirmed recurrent low grade Ta disease with negative brush biopsy of a distal left ureteral stricture at that time. He unfortunately continued to develop recurrences and underwent repeat TURBTs in 08/2013 and 07/2014. He was found to have high-grade Ta urothelial carcinoma without evidence of muscle invasion on the TURBT in February 2016 which was followed by induction and maintenance BCG intravesical instillations.  He underwent repeat TURBT in 05/2015 for disease recurrence followed by an additional 6 weeks course of induction BCG from 05/2015 until 07/2015. Repeat TURBT in 10/2015 confirmed high grade disease recurrence and at that time he went on to complete a 6 week course of induction intravesical gemcitabine with Dr. Ricky Ala at [facility]. He was was found to have disease recurrence on TURBT in 09/2016 with pathology noting a concern for possible muscle invasion at that time but repeat TURBT in May 2018 was negative.  He completed another induction 6 week  course of intravesical gemcitabine at [facility] from [place]. 2018 followed by monthly maintenance gemcitabine between 02/2017 - April 2019. He underwent blue light cystoscopy with TURBT in 10/2017 for disease recurrence followed by repeat 6 week course of induction intravesical gemcitabine June-July 2019.  Surveillance cystoscopy in October 2019 was negative so he was followed in surveillance with repeat office cystoscopy on 08/10/2018 unfortunately showing recurrent disease. He proceeded to repeat TURBT on 08/29/2018 which unfortunately confirmed multiple tumors posteriorly as well as overlying the right ureteral orifice causing obstruction.  The tumor at the right trigone/right ureteral orifice appeared to be extending from the distal right intravesical ureter which was resected followed by right ureteral stent placement. Unfortunately, the final pathology from this procedure confirmed the tumor near the right ureteral orifice to be muscle invasive and likely involving the distal right intravesical ureter.   A CT C/A/P was performed on 09/21/2018 for complete disease staging, which showed no findings to suggest metastatic disease in the chest, abdomen or pelvis.  The patient is not felt to be an ideal candidate for surgical cystectomy given his progressive dementia.  Therefore, he has kindly been referred today for discussion of a bladder sparing, curative treatment approach with concurrent chemoradiation. He is scheduled to meet with Dr. Alen Blew on 10/05/2018 to discuss the systemic therapy options.   PREVIOUS RADIATION THERAPY: No  PAST MEDICAL HISTORY:  Past Medical History:  Diagnosis Date   Alzheimer disease ([place])    Alzheimer's disease ([place])    Bilateral cold feet    Bladder cancer ([place])    dx in 2013   BPH (benign prostatic hypertrophy)  Dementia ([place]) 12-11-11   memory short term(steadily progressive worsening-pt. unaware) .dx. Alzheimers   Diabetes mellitus 12-11-11   oral meds only type  II    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   moderate left hemidiaphragm elevation   Hard of hearing    Hearing loss    has one hearing aid, doesn't wear   History of chemotherapy    History of gallstones 2005   History of kidney stones    Hx of nonmelanoma skin cancer    Hypercholesterolemia    Hypertension 12-11-11   controlled with meds   Pulmonary nodules    Bilateral   Renal calculus, left 10/12/2013   UTI (urinary tract infection) 08-09-13   multiple, last tx. 1 week ago      PAST SURGICAL HISTORY: Past Surgical History:  Procedure Laterality Date   BLADDER SURGERY  12-11-11   2007-tumor removal -stent placed and removed./stent  '08 for stone   CATARACT EXTRACTION, BILATERAL     bil.  LEFT EYE 08-20-09    RT EYE 08-27-09   COLONOSCOPY     CYSTOSCOPY  05-16-2007   cystoscopy  10-09-2008   CYSTOSCOPY N/A 11/18/2015   Procedure: CYSTOSCOPY;  Surgeon: Raynelle Bring, MD;  Location: [facility];  Service: Urology;  Laterality: N/A;   CYSTOSCOPY N/A 10/19/2016   Procedure: CYSTOSCOPY;  Surgeon: Raynelle Bring, MD;  [facility];  Service: Urology;  Laterality: N/A;   CYSTOSCOPY N/A 11/11/2017   Procedure: BLUE LIGHT CYSTOSCOPY WITH CYSVIEW;  Surgeon: Raynelle Bring, MD;  [facility];  Service: Urology;  Laterality: N/A;   CYSTOSCOPY W/ RETROGRADES  12/21/2011   Procedure: CYSTOSCOPY WITH RETROGRADE PYELOGRAM;  Surgeon: [name], MD;  [facility];  Service: Urology;  Laterality: Bilateral;   CYSTOSCOPY W/ RETROGRADES Bilateral 08/14/2013   Procedure: CYSTOSCOPY WITH BILATERAL  RETROGRADE PYELOGRAM/LEFT DIGITAL FLEXIBLE URETEROSCOPY/INSERTION LEFT URETERAL STENT;  Surgeon: [name], MD;  [facility];  Service: Urology;  Laterality: Bilateral;   CYSTOSCOPY W/ RETROGRADES Bilateral 07/23/2014   Procedure: CYSTOSCOPY WITH RETROGRADE PYELOGRAM;  Surgeon: Raynelle Bring, MD;  [facility];  Service: Urology;  Laterality: Bilateral;   CYSTOSCOPY W/ RETROGRADES  Bilateral 05/13/2015   Procedure: CYSTOSCOPY WITH RETROGRADE PYELOGRAM;  Surgeon: Raynelle Bring, MD;  [facility];  Service: Urology;  Laterality: Bilateral;   CYSTOSCOPY W/ RETROGRADES Bilateral 03/02/2016   Procedure: Rolly Salter UNDER ANESTHESIA;  Surgeon: Raynelle Bring, MD;  [facility];  Service: Urology;  Laterality: Bilateral;   CYSTOSCOPY W/ RETROGRADES N/A 09/28/2016   Procedure: CYSTOSCOPY WITH RETROGRADE PYELOGRAM;  Surgeon: Raynelle Bring, MD;  [facility];  Service: Urology;  Laterality: N/A;   CYSTOSCOPY WITH BIOPSY  12/21/2011   Procedure: CYSTOSCOPY WITH BIOPSY;  Surgeon: [name], MD;  [facility];  Service: Urology;;   CYSTOSCOPY WITH BIOPSY N/A 08/29/2018   Procedure: CYSTOSCOPY WITH BIOPSY OF BLADDER TUMOR/ TRANSURETHRAL RESECTION OF BLADDER TUMOR/ RIGHT RETROGRADE PYELOGRAM/RIGHT STENT PLACEMENT;  Surgeon: Raynelle Bring, MD;  [facility];  Service: Urology;  Laterality: N/A;   EXTRACORPOREAL SHOCK WAVE LITHOTRIPSY  2005   EYE SURGERY     LITHOTRIPSY  12-11-11   '05/ '10   mose procedure  Left 11-12-15 and feb 2017   NEPHROLITHOTOMY Left 10/12/2013   Procedure: NEPHROLITHOTOMY PERCUTANEOUS;  Surgeon: [name], MD;  [facility];  Service: Urology;  Laterality: Left;   stent to kidney  04-29-2007   SUBDURAL HEMORRHAGE DRAINAGE     drainage per boreholes only 12-12-03  TONSILLECTOMY  12-11-11   child   TRANSURETHRAL RESECTION OF BLADDER TUMOR  12/21/2011   Procedure: TRANSURETHRAL RESECTION OF BLADDER TUMOR (TURBT);  Surgeon: [name], MD;  [facility];  Service: Urology;  Laterality: N/A;      TRANSURETHRAL RESECTION OF BLADDER TUMOR N/A 08/14/2013   Procedure: TRANSURETHRAL RESECTION OF BLADDER TUMOR (TURBT);  Surgeon: [name], MD;  [facility];  Service: Urology;  Laterality: N/A;   TRANSURETHRAL RESECTION OF BLADDER TUMOR N/A 07/23/2014   Procedure: TRANSURETHRAL RESECTION OF BLADDER TUMOR (TURBT) WITH MITOMYCIN  INSTILLATION;  Surgeon: Raynelle Bring, MD;  [facility];  Service: Urology;  Laterality: N/A;   TRANSURETHRAL RESECTION OF BLADDER TUMOR N/A 05/13/2015   Procedure: TRANSURETHRAL RESECTION OF BLADDER TUMOR (TURBT);  Surgeon: Raynelle Bring, MD;  [facility];  Service: Urology;  Laterality: N/A;   TRANSURETHRAL RESECTION OF BLADDER TUMOR N/A 11/18/2015   Procedure: TRANSURETHRAL RESECTION OF BLADDER TUMOR (TURBT);  Surgeon: Raynelle Bring, MD;  [facility];  Service: Urology;  Laterality: N/A;   TRANSURETHRAL RESECTION OF BLADDER TUMOR N/A 03/02/2016   Procedure: TRANSURETHRAL RESECTION OF BLADDER TUMOR (TURBT);  Surgeon: Raynelle Bring, MD;  [facility];  Service: Urology;  Laterality: N/A;   TRANSURETHRAL RESECTION OF BLADDER TUMOR N/A 09/28/2016   Procedure: TRANSURETHRAL RESECTION OF BLADDER TUMOR (TURBT);  Surgeon: Raynelle Bring, MD;  [facility];  Service: Urology;  Laterality: N/A;   TRANSURETHRAL RESECTION OF BLADDER TUMOR N/A 10/19/2016   Procedure: TRANSURETHRAL RESECTION OF BLADDER TUMOR (TURBT);  Surgeon: Raynelle Bring, MD;  [facility];  Service: Urology;  Laterality: N/A;  NEEDS 30 MIN TOTAL FOR BOTH PROCEDURES   TRANSURETHRAL RESECTION OF BLADDER TUMOR N/A 11/11/2017   Procedure: TRANSURETHRAL RESECTION OF BLADDER TUMOR (TURBT) WITH CYSTOSCOPY;  Surgeon: Raynelle Bring, MD;  [facility];  Service: Urology;  Laterality: N/A;   UPPER GI ENDOSCOPY      FAMILY HISTORY:  Family History  Problem Relation Age of Onset   Breast cancer Sister    Colon cancer Brother 89    SOCIAL HISTORY:  Social History   Socioeconomic History   Marital status: Married    Spouse name: Not on file   Number of children: 2   Years of education: Not on file   Highest education level: Not on file  Occupational History    Comment: retired  Scientist, product/process development strain: Not on file   Food insecurity:    Worry: Not on file    Inability: Not on  Lexicographer needs:    Medical: Not on file    Non-medical: Not on file  Tobacco Use   Smoking status: Never Smoker   Smokeless tobacco: Never Used  Substance and Sexual Activity   Alcohol use: No   Drug use: No   Sexual activity: Not Currently  Lifestyle   Physical activity:    Days per week: Not on file    Minutes per session: Not on file   Stress: Not on file  Relationships   Social connections:    Talks on phone: Not on file    Gets together: Not on file    Attends religious service: Not on file    Active member of club or organization: Not on file    Attends meetings of clubs or organizations: Not on file    Relationship status: Not on file   Intimate partner violence:    Fear of current or ex partner:  Not on file    Emotionally abused: Not on file    Physically abused: Not on file    Forced sexual activity: Not on file  Other Topics Concern   Not on file  Social History Narrative   Not on file    ALLERGIES: Sulfa antibiotics; Tetracyclines & related; and Aricept [donepezil hcl]  MEDICATIONS:  Current Outpatient Medications  Medication Sig Dispense Refill   dutasteride (AVODART) 0.5 MG capsule Take 0.5 mg by mouth daily.      glipiZIDE (GLUCOTROL) 5 MG tablet Take 2.5 mg by mouth daily before breakfast.      hydrochlorothiazide (HYDRODIURIL) 25 MG tablet Take 25 mg by mouth daily.      memantine (NAMENDA XR) 28 MG CP24 24 hr capsule Take 28 mg by mouth every morning.      metFORMIN (GLUCOPHAGE) 500 MG tablet Take 1,000 mg by mouth 2 (two) times daily with a meal.     metoprolol succinate (TOPROL-XL) 25 MG 24 hr tablet Take 25 mg by mouth daily.      Multiple Vitamin (MULTIVITAMIN WITH MINERALS) TABS tablet Take 1 tablet by mouth every evening.     Omega-3 Fatty Acids (FISH OIL) 1200 MG CAPS Take 1,200 mg by mouth every evening.     ONETOUCH DELICA LANCETS 16R MISC 2 (two) times daily. as directed  4   perindopril (ACEON) 4 MG tablet  Take 4 mg by mouth daily.      saxagliptin HCl (ONGLYZA) 5 MG TABS tablet Take 5 mg by mouth daily.     vitamin B-12 (CYANOCOBALAMIN) 500 MCG tablet Take 500 mcg by mouth every evening.     MYRBETRIQ 50 MG TB24 tablet Take 50 mg by mouth daily.     No current facility-administered medications for this encounter.     REVIEW OF SYSTEMS:  On review of systems, the patient reports that he is doing well overall. He denies any chest pain, shortness of breath, cough, fevers, chills, night sweats, unintended weight changes. He denies any bowel disturbances, and denies abdominal pain, nausea or vomiting. He denies any new musculoskeletal or joint aches or pains. He reports daytime urinary frequency, nocturia x3-4, and a 10 lb weight loss since 03/2018. He currently denies gross hematuria or pain. He was prescribed Myrbetriq s/p stent placement, but he reports "it doesn't seem to be helping." A complete review of systems is obtained and is otherwise negative.    PHYSICAL EXAM:  Wt Readings from Last 3 Encounters:  09/30/18 189 lb (85.7 kg)  08/29/18 190 lb (86.2 kg)  08/25/18 190 lb (86.2 kg)   Temp Readings from Last 3 Encounters:  08/29/18 98.4 F (36.9 C)  08/25/18 98.1 F (36.7 C) (Oral)  11/11/17 97.6 F (36.4 C) (Axillary)   BP Readings from Last 3 Encounters:  08/29/18 137/85  08/25/18 (!) 153/80  02/04/18 (!) 135/59   Pulse Readings from Last 3 Encounters:  08/29/18 87  08/25/18 (!) 105  02/04/18 85   Pain Assessment Pain Score: 0-No pain/10 Unable to assess due to telephone format for visit as the patient's Internet was down and could not accommodate the originally scheduled WebEx telemedicine consult platform.   KPS = 80  100 - Normal; no complaints; no evidence of disease. 90   - Able to carry on normal activity; minor signs or symptoms of disease. 80   - Normal activity with effort; some signs or symptoms of disease. 38   - Cares for self; unable to carry  on normal  activity or to do active work. 60   - Requires occasional assistance, but is able to care for most of his personal needs. 50   - Requires considerable assistance and frequent medical care. 51   - Disabled; requires special care and assistance. 52   - Severely disabled; hospital admission is indicated although death not imminent. 12   - Very sick; hospital admission necessary; active supportive treatment necessary. 10   - Moribund; fatal processes progressing rapidly. 0     - Dead  Karnofsky DA, Abelmann [place], Craver LS and Burchenal [facility] [phone]) The use of the nitrogen mustards in the palliative treatment of carcinoma: with particular reference to bronchogenic carcinoma Cancer 1 [id number]  LABORATORY DATA:  Lab Results  Component Value Date   WBC 11.8 (H) 08/25/2018   HGB 15.2 08/25/2018   HCT 47.0 08/25/2018   MCV 86.7 08/25/2018   PLT 300 08/25/2018   Lab Results  Component Value Date   NA 138 08/25/2018   K 3.9 08/25/2018   CL 99 08/25/2018   CO2 25 08/25/2018   Lab Results  Component Value Date   ALT 16 04/29/2007   AST 21 04/29/2007   ALKPHOS 55 04/29/2007   BILITOT 1.5 (H) 04/29/2007     RADIOGRAPHY: No results found.    IMPRESSION/PLAN: 1. 78 y.o. gentleman with muscle invasive urothelial carcinoma of the bladder involving the distal right ureter. This visit was conducted via telephone to spare the patient unnecessary potential exposure in the healthcare setting during the current COVID-19 pandemic. Our conversation took place with the patient's wife, [name], who provided the clinical information given the patient's dementia. The patient, however, was also present for our conversation. We discussed the patient's workup and outlined the nature of muscle invasive bladder cancer in this setting. We discussed his available options, including neoadjuvant chemotherapy with radical cystectomy versus repeat TURBT followed by concurrent chemoradiation for a curative, bladder sparing  approach to treatment. We discussed the available radiation techniques, and focused on the details and logistics and delivery. They were informed that concurrent chemoradiation would consist of a 7 week (36 fraction) course of daily radiation treatments concurrent with weekly systemic therapy. We discussed and outlined the risks, benefits, short and long-term effects associated with radiotherapy in this setting.  The patient and his wife were encouraged to ask questions that were answered to their stated satisfaction.  At the conclusion of our conversation, they elect to move forward with a repeat TURBT for maximal tumor resection with Dr. Alinda Money followed by daily external beam radiation therapy concurrent with systemic chemotherapy. We will share our discussion with Dr. Alinda Money and Dr. Alen Blew and will move forward with treatment planning approximately 2 weeks after his repeat TUR procedure.  Given current concerns for patient exposure during the COVID-19 pandemic, this encounter was conducted via telephone. The patient and his wife were notified in advance and were offered a [place] meeting to allow for face to face communication, but they were unfortunately unable to maintain a stable Internet connection to support such a visit and instead opted to proceed with telephone consult. Due to the patient's progressive dementia, the patient's wife has given verbal consent for this type of encounter. The time spent during this encounter was 60 minutes and approximately 50% of that time was spent in the review of outside records and coordination of patient care. The attendants for this meeting include Tyler Pita MD, Freeman Caldron PA-C, [place], patient Roth Ress" [place],  and patient's wife, Johnpaul Gillentine. During the encounter, Tyler Pita MD, Ashlyn Bruning PA-C, and scribe, Wilburn Mylar were located at [place].  Patient [place] "Pieter Partridge"  [place], and patient's wife, Nathian Stencil, were located at home.   Nicholos Johns, PA-C    Tyler Pita, MD  [facility] Direct Dial: [phone]   Fax: [phone] [email or web address]   Skype   [email or web address]   This document serves as a record of services personally performed by Tyler Pita, MD. It was created on his behalf by Wilburn Mylar, a trained medical scribe. The creation of this record is based on the scribe's personal observations and the provider's statements to them. This document has been checked and approved by the attending provider.

## 2018-09-30 NOTE — Progress Notes (Signed)
See progress note under physician encounter. 

## 2018-10-05 ENCOUNTER — Other Ambulatory Visit: Payer: Self-pay | Admitting: Urology

## 2018-10-05 ENCOUNTER — Inpatient Hospital Stay: Payer: Medicare Other | Attending: Oncology | Admitting: Oncology

## 2018-10-05 DIAGNOSIS — C679 Malignant neoplasm of bladder, unspecified: Secondary | ICD-10-CM | POA: Diagnosis not present

## 2018-10-05 NOTE — Progress Notes (Signed)
Hematology and Oncology consult for Telemedicine Visits  [name] [name] [id number] 1940/07/31 78 y.o. 10/05/2018 1:55 PM [name] [name], MDRamachandran, [name] Kaufmann, MD   I connected with [name] [name] on 10/05/18 at  2:00 PM EDT by phone and verified that I am speaking with the correct person using two identifiers.   I discussed the limitations, risks, security and privacy concerns of performing an evaluation and management service by telemedicine and the availability of in-person appointments. I also discussed with the patient that there may be a patient responsible charge related to this service. The patient expressed understanding and agreed to proceed.  Other persons participating in the visit and their role in the encounter: [name] [name] his spouse  Patient's location: Home Provider's location: Office    Reason for the request:    Bladder cancer  HPI: I was asked by Dr. Alinda [name]   to evaluate [name] [name] for bladder cancer.  He is a 78 year old man currently of [place] with a past medical history significant for diabetes and dementia.  He has longstanding history of bladder cancer dating back to 2007.  At that time he had a low-grade urothelial carcinoma and has multiple therapies and recurrences outlined as follows:  He was initially diagnosed with low grade Ta urothelial carcinoma of the bladder in 2007 and was treated with a TURBT and followed closely in observation thereafter with routine surveillance office cystoscopy.   He was noted to have a recurrence in 2013 and underwent repeat TURBT in 12/2011 which confirmed recurrent low grade Ta disease with negative brush biopsy of a distal left ureteral stricture at that time.   He continued to develop recurrences and underwent repeat TURBTs in 08/2013 and 07/2014. He was found to have high-grade Ta urothelial carcinoma without evidence of muscle invasion on the TURBT in February 2016 which was followed by induction and maintenance BCG  intravesical instillations.  He underwent repeat TURBT in 05/2015 for disease recurrence followed by an additional 6 weeks course of induction BCG from 05/2015 until 07/2015.   Repeat TURBT in 10/2015 confirmed high grade disease recurrence and at that time he went on to complete a 6 week course of induction intravesical gemcitabine with Dr. Ricky [name] at [facility]. He was was found to have disease recurrence on TURBT in 09/2016 with pathology noting a concern for possible muscle invasion at that time but repeat TURBT in May 2018 was negative.    He completed another induction 6 week course of intravesical gemcitabine at [facility] from [place]. 2018 followed by monthly maintenance gemcitabine between 02/2017 - April 2019.   He underwent blue light cystoscopy with TURBT in 10/2017 for disease recurrence followed by repeat 6 week course of induction intravesical gemcitabine June-July 2019.  Surveillance cystoscopy in October 2019 was negative so he was followed in surveillance with repeat office cystoscopy on 08/10/2018  He had a repeat TURBT on 08/29/2018 which unfortunately confirmed multiple tumors posteriorly as well as overlying the right ureteral orifice causing obstruction.  The tumor at the right trigone/right ureteral orifice appeared to be extending from the distal right intravesical ureter which was resected followed by right ureteral stent placement. Pathology from this procedure confirmed the tumor near the right ureteral orifice to be muscle invasive and likely involving the distal right intravesical ureter.    CT C/A/P was performed on 09/21/2018 for complete disease staging, which showed no findings to suggest metastatic disease in the chest, abdomen or pelvis.  Clinically, he reports no complaints and does not report any  pain or discomfort.  Per his wife he has not had any recent issues.    Past Medical History:  Diagnosis Date  . Alzheimer disease ([place])   . Alzheimer's disease ([place])   . Bilateral cold  feet   . Bladder cancer ([place])    dx in 2013  . BPH (benign prostatic hypertrophy)   . Dementia ([place]) 12-11-11   memory short term(steadily progressive worsening-pt. unaware) .dx. Alzheimers  . Diabetes mellitus 12-11-11   oral meds only type II   . 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 Hard of hearing   . Hearing loss    has one hearing aid, doesn't wear  . History of chemotherapy   . History of gallstones 2005  . History of kidney stones   . Hx of nonmelanoma skin cancer   . Hypercholesterolemia   . Hypertension 12-11-11   controlled with meds  . Pulmonary nodules    Bilateral  . Renal calculus, left 10/12/2013  . UTI (urinary tract infection) 08-09-13   multiple, last tx. 1 week ago  :  Past Surgical History:  Procedure Laterality Date  . BLADDER SURGERY  12-11-11   2007-tumor removal -stent placed and removed.Lavell Islam  '08 for stone  . CATARACT EXTRACTION, BILATERAL     bil.  LEFT EYE 08-20-09    RT EYE 08-27-09  . COLONOSCOPY    . CYSTOSCOPY  05-16-2007  . cystoscopy  10-09-2008  . CYSTOSCOPY N/A 11/18/2015   Procedure: CYSTOSCOPY;  Surgeon: Raynelle Bring, MD;  Location: [facility];  Service: Urology;  Laterality: N/A;  . CYSTOSCOPY N/A 10/19/2016   Procedure: CYSTOSCOPY;  Surgeon: Raynelle Bring, MD;  [facility];  Service: Urology;  Laterality: N/A;  . CYSTOSCOPY N/A 11/11/2017   Procedure: BLUE LIGHT CYSTOSCOPY WITH CYSVIEW;  Surgeon: Raynelle Bring, MD;  [facility];  Service: Urology;  Laterality: N/A;  . CYSTOSCOPY W/ RETROGRADES  12/21/2011   Procedure: CYSTOSCOPY WITH RETROGRADE PYELOGRAM;  Surgeon: [name], MD;  [facility];  Service: Urology;  Laterality: Bilateral;  . CYSTOSCOPY W/ RETROGRADES Bilateral 08/14/2013   Procedure: CYSTOSCOPY WITH BILATERAL  RETROGRADE PYELOGRAM/LEFT DIGITAL FLEXIBLE URETEROSCOPY/INSERTION LEFT URETERAL STENT;  Surgeon: [name], MD;  [facility];  Service: Urology;  Laterality: Bilateral;  .  CYSTOSCOPY W/ RETROGRADES Bilateral 07/23/2014   Procedure: CYSTOSCOPY WITH RETROGRADE PYELOGRAM;  Surgeon: Raynelle Bring, MD;  [facility];  Service: Urology;  Laterality: Bilateral;  . CYSTOSCOPY W/ RETROGRADES Bilateral 05/13/2015   Procedure: CYSTOSCOPY WITH RETROGRADE PYELOGRAM;  Surgeon: Raynelle Bring, MD;  [facility];  Service: Urology;  Laterality: Bilateral;  . CYSTOSCOPY W/ RETROGRADES Bilateral 03/02/2016   Procedure: CYSTOSCOPY, EXAM UNDER ANESTHESIA;  Surgeon: Raynelle Bring, MD;  [facility];  Service: Urology;  Laterality: Bilateral;  . CYSTOSCOPY W/ RETROGRADES N/A 09/28/2016   Procedure: CYSTOSCOPY WITH RETROGRADE PYELOGRAM;  Surgeon: Raynelle Bring, MD;  [facility];  Service: Urology;  Laterality: N/A;  . CYSTOSCOPY WITH BIOPSY  12/21/2011   Procedure: CYSTOSCOPY WITH BIOPSY;  Surgeon: [name], MD;  [facility];  Service: Urology;;  . Consuela Mimes WITH BIOPSY N/A 08/29/2018   Procedure: CYSTOSCOPY WITH BIOPSY OF BLADDER TUMOR/ TRANSURETHRAL RESECTION OF BLADDER TUMOR/ RIGHT RETROGRADE PYELOGRAM/RIGHT STENT PLACEMENT;  Surgeon: Raynelle Bring, MD;  [facility];  Service: Urology;  Laterality: N/A;  . EXTRACORPOREAL SHOCK WAVE LITHOTRIPSY  2005  . EYE SURGERY    . LITHOTRIPSY  12-11-11   '05/ '10  .  mose procedure  Left 11-12-15 and feb 2017  . NEPHROLITHOTOMY Left 10/12/2013   Procedure: NEPHROLITHOTOMY PERCUTANEOUS;  Surgeon: [name], MD;  [facility];  Service: Urology;  Laterality: Left;  . stent to kidney  04-29-2007  . SUBDURAL HEMORRHAGE DRAINAGE     drainage per boreholes only 12-12-03  . TONSILLECTOMY  12-11-11   child  . TRANSURETHRAL RESECTION OF BLADDER TUMOR  12/21/2011   Procedure: TRANSURETHRAL RESECTION OF BLADDER TUMOR (TURBT);  Surgeon: [name], MD;  [facility];  Service: Urology;  Laterality: N/A;     . TRANSURETHRAL RESECTION OF BLADDER TUMOR N/A 08/14/2013   Procedure: TRANSURETHRAL RESECTION OF BLADDER TUMOR (TURBT);   Surgeon: [name], MD;  [facility];  Service: Urology;  Laterality: N/A;  . TRANSURETHRAL RESECTION OF BLADDER TUMOR N/A 07/23/2014   Procedure: TRANSURETHRAL RESECTION OF BLADDER TUMOR (TURBT) WITH MITOMYCIN INSTILLATION;  Surgeon: Raynelle Bring, MD;  [facility];  Service: Urology;  Laterality: N/A;  . TRANSURETHRAL RESECTION OF BLADDER TUMOR N/A 05/13/2015   Procedure: TRANSURETHRAL RESECTION OF BLADDER TUMOR (TURBT);  Surgeon: Raynelle Bring, MD;  [facility];  Service: Urology;  Laterality: N/A;  . TRANSURETHRAL RESECTION OF BLADDER TUMOR N/A 11/18/2015   Procedure: TRANSURETHRAL RESECTION OF BLADDER TUMOR (TURBT);  Surgeon: Raynelle Bring, MD;  [facility];  Service: Urology;  Laterality: N/A;  . TRANSURETHRAL RESECTION OF BLADDER TUMOR N/A 03/02/2016   Procedure: TRANSURETHRAL RESECTION OF BLADDER TUMOR (TURBT);  Surgeon: Raynelle Bring, MD;  [facility];  Service: Urology;  Laterality: N/A;  . TRANSURETHRAL RESECTION OF BLADDER TUMOR N/A 09/28/2016   Procedure: TRANSURETHRAL RESECTION OF BLADDER TUMOR (TURBT);  Surgeon: Raynelle Bring, MD;  [facility];  Service: Urology;  Laterality: N/A;  . TRANSURETHRAL RESECTION OF BLADDER TUMOR N/A 10/19/2016   Procedure: TRANSURETHRAL RESECTION OF BLADDER TUMOR (TURBT);  Surgeon: Raynelle Bring, MD;  [facility];  Service: Urology;  Laterality: N/A;  NEEDS 30 MIN TOTAL FOR BOTH PROCEDURES  . TRANSURETHRAL RESECTION OF BLADDER TUMOR N/A 11/11/2017   Procedure: TRANSURETHRAL RESECTION OF BLADDER TUMOR (TURBT) WITH CYSTOSCOPY;  Surgeon: Raynelle Bring, MD;  [facility];  Service: Urology;  Laterality: N/A;  . UPPER GI ENDOSCOPY    :   Current Outpatient Medications:  .  dutasteride (AVODART) 0.5 MG capsule, Take 0.5 mg by mouth daily. , Disp: , Rfl:  .  glipiZIDE (GLUCOTROL) 5 MG tablet, Take 2.5 mg by mouth daily before breakfast. , Disp: , Rfl:  .  hydrochlorothiazide (HYDRODIURIL) 25 MG tablet, Take 25 mg by mouth  daily. , Disp: , Rfl:  .  memantine (NAMENDA XR) 28 MG CP24 24 hr capsule, Take 28 mg by mouth every morning. , Disp: , Rfl:  .  metFORMIN (GLUCOPHAGE) 500 MG tablet, Take 1,000 mg by mouth 2 (two) times daily with a meal., Disp: , Rfl:  .  metoprolol succinate (TOPROL-XL) 25 MG 24 hr tablet, Take 25 mg by mouth daily. , Disp: , Rfl:  .  Multiple Vitamin (MULTIVITAMIN WITH MINERALS) TABS tablet, Take 1 tablet by mouth every evening., Disp: , Rfl:  .  MYRBETRIQ 50 MG TB24 tablet, Take 50 mg by mouth daily., Disp: , Rfl:  .  Omega-3 Fatty Acids (FISH OIL) 1200 MG CAPS, Take 1,200 mg by mouth every evening., Disp: , Rfl:  .  ONETOUCH DELICA LANCETS 24M MISC, 2 (two) times daily. as directed, Disp: , Rfl: 4 .  perindopril (ACEON) 4 MG tablet, Take 4 mg  by mouth daily. , Disp: , Rfl:  .  saxagliptin HCl (ONGLYZA) 5 MG TABS tablet, Take 5 mg by mouth daily., Disp: , Rfl:  .  vitamin B-12 (CYANOCOBALAMIN) 500 MCG tablet, Take 500 mcg by mouth every evening., Disp: , Rfl: :  Allergies  Allergen Reactions  . Sulfa Antibiotics Hives  . Tetracyclines & Related Rash  . Aricept [Donepezil Hcl] Nausea And Vomiting  :  Family History  Problem Relation Age of Onset  . Breast cancer Sister   . Colon cancer Brother 70  :  Social History   Socioeconomic History  . Marital status: Married    Spouse name: Not on file  . Number of children: 2  . Years of education: Not on file  . Highest education level: Not on file  Occupational History    Comment: retired  Scientific laboratory technician  . Financial resource strain: Not on file  . Food insecurity:    Worry: Not on file    Inability: Not on file  . Transportation needs:    Medical: Not on file    Non-medical: Not on file  Tobacco Use  . Smoking status: Never Smoker  . Smokeless tobacco: Never Used  Substance and Sexual Activity  . Alcohol use: No  . Drug use: No  . Sexual activity: Not Currently  Lifestyle  . Physical activity:    Days per week: Not on  file    Minutes per session: Not on file  . Stress: Not on file  Relationships  . Social connections:    Talks on phone: Not on file    Gets together: Not on file    Attends religious service: Not on file    Active member of club or organization: Not on file    Attends meetings of clubs or organizations: Not on file    Relationship status: Not on file  . Intimate partner violence:    Fear of current or ex partner: Not on file    Emotionally abused: Not on file    Physically abused: Not on file    Forced sexual activity: Not on file  Other Topics Concern  . Not on file  Social History Narrative  . Not on file  :  CBC    Component Value Date/Time   WBC 11.8 (H) 08/25/2018 1030   RBC 5.42 08/25/2018 1030   HGB 15.2 08/25/2018 1030   HCT 47.0 08/25/2018 1030   PLT 300 08/25/2018 1030   MCV 86.7 08/25/2018 1030   MCH 28.0 08/25/2018 1030   MCHC 32.3 08/25/2018 1030   RDW 11.9 08/25/2018 1030   LYMPHSABS 0.8 10/12/2008 0455   MONOABS 1.5 (H) 10/12/2008 0455   EOSABS 0.1 10/12/2008 0455   BASOSABS 0.0 10/12/2008 0455     Chemistry      Component Value Date/Time   NA 138 08/25/2018 1030   K 3.9 08/25/2018 1030   CL 99 08/25/2018 1030   CO2 25 08/25/2018 1030   BUN 24 (H) 08/25/2018 1030   CREATININE 1.00 08/25/2018 1030      Component Value Date/Time   CALCIUM 9.3 08/25/2018 1030   ALKPHOS 55 04/29/2007 1456   AST 21 04/29/2007 1456   ALT 16 04/29/2007 1456   BILITOT 1.5 (H) 04/29/2007 1456          Assessment and Plan:   78 year old with the following:  1.  Muscle invasive high-grade urothelial carcinoma of the bladder involving the right trigone and the ureteral orifice diagnosed  in March 2020.  His disease appears to be muscle invasive without any evidence of metastatic disease.  The natural course of this disease was discussed today with the patient and his wife.  The rationale for using systemic chemotherapy was discussed to be used concomitantly with  radiation.  Single agent carboplatin would be a reasonable option given his good kidney function.  Complication associated with this chemotherapy was discussed today with nausea, vomiting, myelosuppression and renal dysfunction.  He will receive this treatment on a weekly basis for total of 6 weeks.  Anticipate the start of treatment in mid to late May after his schedule TURBT the early part of May.  After discussion today, they are agreeable to proceed.  Chemotherapy education class will be conducted via the phone closer to the start date.  2.  IV access: Peripheral veins will be used at this time.  3.  Antiemetics: Prescription for Compazine will be made available to him prior to the start of treatment.    I discussed the assessment and treatment plan with the patient. The patient was provided an opportunity to ask questions and all were answered. The patient agreed with the plan and demonstrated an understanding of the instructions.   The patient was advised to call back or seek an in-person evaluation if the symptoms worsen or if the condition fails to improve as anticipated.  I provided 20 minutes of non face-to-face telephone visit time during this encounter, and > 50% was dedicated to reviewing his disease status, treatment options and complications related to therapy.  Zola Button, MD 10/05/2018 1:55 PM

## 2018-10-06 ENCOUNTER — Telehealth: Payer: Self-pay | Admitting: Oncology

## 2018-10-06 NOTE — Telephone Encounter (Signed)
No los per 4/22. °

## 2018-10-10 DIAGNOSIS — H34811 Central retinal vein occlusion, right eye, with macular edema: Secondary | ICD-10-CM | POA: Diagnosis not present

## 2018-10-10 NOTE — Patient Instructions (Addendum)
[name] [name]  10/10/2018   Your procedure is scheduled on: 10-17-2018   Report to [facility] Main  Entrance     Report to admitting at 9:30AM    Call this number if you have problems the morning of surgery [phone]      Remember: Do not eat food or drink liquids :After Midnight. BRUSH YOUR TEETH MORNING OF SURGERY AND RINSE YOUR MOUTH OUT, NO CHEWING GUM CANDY OR MINTS.      Take these medicines the morning of surgery with A SIP OF WATER: METOPROLOL, MEMANTINE, DUTASTERIDE     DO NOT TAKE ANY DIABETES MEDICATION THE DAY OF SURGERY! PLEASE CHECK YOUR BLOOD SUGAR THE DAY OF SURGERY. REPORT TO YOUR NURSE ON ARRIVAL.  IF YOUR BLOOD SUGAR IS LESS THAN 70 THE MORNING SURGERY WHEN YOU CHECK IT AT HOME, YOU NEED TO CALL THE [phone] FOR FURTHER INSTRUCTIONS!                                 You may not have any metal on your body including hair pins and              piercings  Do not wear jewelry, make-up, lotions, powders or perfumes, deodorant             Do not wear nail polish.  Do not shave  48 hours prior to surgery.              Men may shave face and neck.   Do not bring valuables to the hospital. [place].  Contacts, dentures or bridgework may not be worn into surgery.  Leave suitcase in the car. After surgery it may be brought to your room.     Patients discharged the day of surgery will not be allowed to drive home. IF YOU ARE HAVING SURGERY AND GOING HOME THE SAME DAY, YOU MUST HAVE AN ADULT TO DRIVE YOU HOME AND BE WITH YOU FOR 24 HOURS. YOU MAY GO HOME BY TAXI OR UBER OR ORTHERWISE, BUT AN ADULT MUST ACCOMPANY YOU HOME AND STAY WITH YOU FOR 24 HOURS.  Name and phone number of your driver:  Special Instructions: N/A              Please read over the following fact sheets you were given: _____________________________________________________________________              [facility] - Preparing for Surgery Before surgery, you can play an important role.  Because skin is not sterile, your skin needs to be as free of germs as possible.  You can reduce the number of germs on your skin by washing with CHG (chlorahexidine gluconate) soap before surgery.  CHG is an antiseptic cleaner which kills germs and bonds with the skin to continue killing germs even after washing. Please DO NOT use if you have an allergy to CHG or antibacterial soaps.  If your skin becomes reddened/irritated stop using the CHG and inform your nurse when you arrive at Short Stay. Do not shave (including legs and underarms) for at least 48 hours prior to the first CHG shower.  You may shave your face/neck. Please follow these instructions carefully:  1.  Shower with CHG  Soap the night before surgery and the  morning of Surgery.  2.  If you choose to wash your hair, wash your hair first as usual with your  normal  shampoo.  3.  After you shampoo, rinse your hair and body thoroughly to remove the  shampoo.                           4.  Use CHG as you would any other liquid soap.  You can apply chg directly  to the skin and wash                       Gently with a scrungie or clean washcloth.  5.  Apply the CHG Soap to your body ONLY FROM THE NECK DOWN.   Do not use on face/ open                           Wound or open sores. Avoid contact with eyes, ears mouth and genitals (private parts).                       Wash face,  Genitals (private parts) with your normal soap.             6.  Wash thoroughly, paying special attention to the area where your surgery  will be performed.  7.  Thoroughly rinse your body with warm water from the neck down.  8.  DO NOT shower/wash with your normal soap after using and rinsing off  the CHG Soap.                9.  Pat yourself dry with a clean towel.            10.  Wear clean pajamas.            11.  Place clean sheets on your bed the night of your first shower and  do not  sleep with pets. Day of Surgery : Do not apply any lotions/deodorants the morning of surgery.  Please wear clean clothes to the hospital/surgery center.  FAILURE TO FOLLOW THESE INSTRUCTIONS MAY RESULT IN THE CANCELLATION OF YOUR SURGERY PATIENT SIGNATURE_________________________________  NURSE SIGNATURE__________________________________  ________________________________________________________________________

## 2018-10-10 NOTE — Progress Notes (Addendum)
SPOKE W/  _ patients wife      SCREENING SYMPTOMS OF COVID 19:   COUGH-- denies   RUNNY NOSE--- denies   SORE THROAT---denies   NASAL CONGESTION----denies   SNEEZING---denies   SHORTNESS OF BREATH---denies   DIFFICULTY BREATHING---denies   TEMP >100.4-----denies   UNEXPLAINED BODY ACHES------denies    HAVE YOU OR ANY FAMILY MEMBER TRAVELLED PAST 14 DAYS OUT OF THE   COUNTY---denies  STATE----denies  COUNTRY----denies   HAVE YOU OR ANY FAMILY MEMBER BEEN EXPOSED TO ANYONE WITH COVID 19?    denies

## 2018-10-11 ENCOUNTER — Encounter (HOSPITAL_COMMUNITY): Payer: Self-pay

## 2018-10-11 ENCOUNTER — Encounter (HOSPITAL_COMMUNITY)
Admission: RE | Admit: 2018-10-11 | Discharge: 2018-10-11 | Disposition: A | Discharge status: Home / Self Care | Payer: Medicare Other | Source: Ambulatory Visit | Attending: Urology | Admitting: Urology

## 2018-10-11 ENCOUNTER — Other Ambulatory Visit: Payer: Self-pay

## 2018-10-11 DIAGNOSIS — Z01812 Encounter for preprocedural laboratory examination: Secondary | ICD-10-CM | POA: Diagnosis not present

## 2018-10-11 HISTORY — DX: Frequency of micturition: R35.0

## 2018-10-11 LAB — CBC
HCT: 42 % (ref 39.0–52.0)
Hemoglobin: 13.7 g/dL (ref 13.0–17.0)
MCH: 28.4 pg (ref 26.0–34.0)
MCHC: 32.6 g/dL (ref 30.0–36.0)
MCV: 87 fL (ref 80.0–100.0)
Platelets: 261 10*3/uL (ref 150–400)
RBC: 4.83 MIL/uL (ref 4.22–5.81)
RDW: 12.7 % (ref 11.5–15.5)
WBC: 8.6 10*3/uL (ref 4.0–10.5)
nRBC: 0 % (ref 0.0–0.2)

## 2018-10-11 LAB — BASIC METABOLIC PANEL
Anion gap: 10 (ref 5–15)
BUN: 23 mg/dL (ref 8–23)
CO2: 27 mmol/L (ref 22–32)
Calcium: 9.2 mg/dL (ref 8.9–10.3)
Chloride: 105 mmol/L (ref 98–111)
Creatinine, Ser: 0.88 mg/dL (ref 0.61–1.24)
GFR calc Af Amer: >60 mL/min (ref >60)
GFR calc non Af Amer: >60 mL/min (ref >60)
Glucose, Bld: 167 mg/dL — ABNORMAL HIGH (ref 70–99)
Potassium: 3.7 mmol/L (ref 3.5–5.1)
Sodium: 142 mmol/L (ref 135–145)

## 2018-10-11 LAB — GLUCOSE, CAPILLARY: Glucose-Capillary: 180 mg/dL — ABNORMAL HIGH (ref 70–99)

## 2018-10-13 NOTE — Anesthesia Preprocedure Evaluation (Addendum)
Anesthesia Evaluation  Patient identified by MRN, date of birth, ID band Patient confused    Reviewed: Allergy & Precautions, NPO status , Patient's Chart, lab work & pertinent test results  History of Anesthesia Complications Negative for: history of anesthetic complications  Airway Mallampati: II  TM Distance: >3 FB Neck ROM: Full    Dental no notable dental hx. (+) Dental Advisory Given   Pulmonary neg pulmonary ROS,    Pulmonary exam normal breath sounds clear to auscultation       Cardiovascular hypertension, Pt. on medications Normal cardiovascular exam Rhythm:Regular Rate:Normal  EKG: NSR, LAFB   Neuro/Psych Dementia Elevated hemidiaphragm negative neurological ROS     GI/Hepatic negative GI ROS, Neg liver ROS,   Endo/Other  diabetes, Type 2, Oral Hypoglycemic Agents  Renal/GU negative Renal ROS   Bladder ca    Musculoskeletal negative musculoskeletal ROS (+)   Abdominal   Peds  Hematology negative hematology ROS (+)   Anesthesia Other Findings Day of surgery medications reviewed with the patient.  Reproductive/Obstetrics                           Anesthesia Physical Anesthesia Plan  ASA: II  Anesthesia Plan: General   Post-op Pain Management:    Induction: Intravenous  PONV Risk Score and Plan: 2 and Treatment may vary due to age or medical condition and Ondansetron  Airway Management Planned: Oral ETT  Additional Equipment:   Intra-op Plan:   Post-operative Plan: Extubation in OR  Informed Consent: I have reviewed the patients History and Physical, chart, labs and discussed the procedure including the risks, benefits and alternatives for the proposed anesthesia with the patient or authorized representative who has indicated his/her understanding and acceptance.     Dental advisory given  Plan Discussed with: CRNA  Anesthesia Plan Comments: (See PAT note  10/11/2018, Konrad Felix, PA-C  Reviewed consent with patient and wife, [name], by telephone. All questions answered.)      Anesthesia Quick Evaluation

## 2018-10-13 NOTE — Progress Notes (Signed)
Anesthesia Chart Review   Case:  [id number] Date/Time:  10/17/18 1114   Procedure:  TRANSURETHRAL RESECTION OF BLADDER TUMOR (TURBT) WITH CYSTOSCOPY, RIGHT URETEROSCOPY, POSSIBLE URETEROSCOPIC RESECTION, RIGHT URETERAL STENT CHANGE (N/A ) - GENERAL ANESTHESIA WITH POSSIBLE PARALYSIS   Anesthesia type:  General   Pre-op diagnosis:  BLADDER CANCER   Location:  [facility] / [facility]   Surgeon:  Raynelle Bring, MD      DISCUSSION: 78 yo never smoker with h/o HTN, Alzheimer's disease, dementia, DM II, hypercholesterolemia, bladder cancer scheduled for above procedure 10/17/18 with Dr. Raynelle Bring.   Anesthesia records reviewed, s/p cystoscopy, TURBT 08/29/18 with no anesthesia complications noted.    Pt can proceed with planned procedure barring acute status change.  VS: BP (!) 157/82   Pulse 89   Temp 36.6 C (Oral)   Resp 18   Ht 5\' 10"  (1.778 m)   Wt 87.3 kg   SpO2 100%   BMI 27.61 kg/m   PROVIDERS: [name], MD is PCP   Zola Button MD is Cardiologist  LABS: Labs reviewed: Acceptable for surgery. (all labs ordered are listed, but only abnormal results are displayed)  Labs Reviewed  GLUCOSE, CAPILLARY - Abnormal; Notable for the following components:      Result Value   Glucose-Capillary 180 (*)    All other components within normal limits  BASIC METABOLIC PANEL - Abnormal; Notable for the following components:   Glucose, Bld 167 (*)    All other components within normal limits  CBC     IMAGES:   EKG: 08/25/2018 Rate 95 bpm Normal sinus rhythm  Non-specific intra-ventricular conduction delay Left anterior fascicular block Abnormal ecg   CV: 08/18/2012 Study Conclusions  - Left ventricle: The cavity size was normal. Wall thickness was increased in a pattern of mild LVH. Systolic function was normal. The estimated ejection fraction was in the range of 55% to 65%. Wall motion was normal; there were no regional wall motion abnormalities. Doppler  parameters are consistent with abnormal left ventricular relaxation (grade 1 diastolic dysfunction). - Atrial septum: No defect or patent foramen ovale was identified. Past Medical History:  Diagnosis Date  . Alzheimer disease ([place])   . Alzheimer's disease ([place])   . Bilateral cold feet   . Bladder cancer ([place])    dx in 2013  . BPH (benign prostatic hypertrophy)   . Dementia ([place]) 12-11-11   memory short term(steadily progressive worsening-pt. unaware) .dx. Alzheimers  . Diabetes mellitus 12-11-11   oral meds only type II   . 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 Hard of hearing   . Hearing loss    has one hearing aid, doesn't wear  . History of chemotherapy   . History of gallstones 2005  . History of kidney stones   . Hx of nonmelanoma skin cancer   . Hypercholesterolemia   . Hypertension 12-11-11   controlled with meds  . Increased frequency of urination    wife reports urinary frequency has not improved since last surgery   . Pulmonary nodules    Bilateral  . Renal calculus, left 10/12/2013  . UTI (urinary tract infection) 08-09-13   multiple, last tx. 1 week ago    Past Surgical History:  Procedure Laterality Date  . BLADDER SURGERY  12-11-11   2007-tumor removal -stent placed and removed.Lavell Islam  '08 for stone  . CATARACT EXTRACTION, BILATERAL     bil.  LEFT EYE 08-20-09  RT EYE 08-27-09  . COLONOSCOPY    . CYSTOSCOPY  05-16-2007  . cystoscopy  10-09-2008  . CYSTOSCOPY N/A 11/18/2015   Procedure: CYSTOSCOPY;  Surgeon: Raynelle Bring, MD;  Location: [facility];  Service: Urology;  Laterality: N/A;  . CYSTOSCOPY N/A 10/19/2016   Procedure: CYSTOSCOPY;  Surgeon: Raynelle Bring, MD;  Location: [facility];  Service: Urology;  Laterality: N/A;  . CYSTOSCOPY N/A 11/11/2017   Procedure: BLUE LIGHT CYSTOSCOPY WITH CYSVIEW;  Surgeon: Raynelle Bring, MD;  Location: [facility];  Service: Urology;  Laterality: N/A;  . CYSTOSCOPY W/ RETROGRADES  12/21/2011    Procedure: CYSTOSCOPY WITH RETROGRADE PYELOGRAM;  Surgeon: [name], MD;  Location: [facility];  Service: Urology;  Laterality: Bilateral;  . CYSTOSCOPY W/ RETROGRADES Bilateral 08/14/2013   Procedure: CYSTOSCOPY WITH BILATERAL  RETROGRADE PYELOGRAM/LEFT DIGITAL FLEXIBLE URETEROSCOPY/INSERTION LEFT URETERAL STENT;  Surgeon: [name], MD;  Location: [facility];  Service: Urology;  Laterality: Bilateral;  . CYSTOSCOPY W/ RETROGRADES Bilateral 07/23/2014   Procedure: CYSTOSCOPY WITH RETROGRADE PYELOGRAM;  Surgeon: Raynelle Bring, MD;  Location: [facility];  Service: Urology;  Laterality: Bilateral;  . CYSTOSCOPY W/ RETROGRADES Bilateral 05/13/2015   Procedure: CYSTOSCOPY WITH RETROGRADE PYELOGRAM;  Surgeon: Raynelle Bring, MD;  Location: [facility];  Service: Urology;  Laterality: Bilateral;  . CYSTOSCOPY W/ RETROGRADES Bilateral 03/02/2016   Procedure: CYSTOSCOPY, EXAM UNDER ANESTHESIA;  Surgeon: Raynelle Bring, MD;  Location: [facility];  Service: Urology;  Laterality: Bilateral;  . CYSTOSCOPY W/ RETROGRADES N/A 09/28/2016   Procedure: CYSTOSCOPY WITH RETROGRADE PYELOGRAM;  Surgeon: Raynelle Bring, MD;  [facility]: [facility];  Service: Urology;  Laterality: N/A;  . CYSTOSCOPY WITH BIOPSY  12/21/2011   Procedure: CYSTOSCOPY WITH BIOPSY;  Surgeon: [name], MD;  Location: [facility];  Service: Urology;;  . Consuela Mimes WITH BIOPSY N/A 08/29/2018   Procedure: CYSTOSCOPY WITH BIOPSY OF BLADDER TUMOR/ TRANSURETHRAL RESECTION OF BLADDER TUMOR/ RIGHT RETROGRADE PYELOGRAM/RIGHT STENT PLACEMENT;  Surgeon: Raynelle Bring, MD;  Location: [facility];  Service: Urology;  Laterality: N/A;  . EXTRACORPOREAL SHOCK WAVE LITHOTRIPSY  2005  . EYE SURGERY    . LITHOTRIPSY  12-11-11   '05/ '10  . mose procedure  Left 11-12-15 and feb 2017  . NEPHROLITHOTOMY Left 10/12/2013   Procedure: NEPHROLITHOTOMY PERCUTANEOUS;  Surgeon: [name], MD;  Location: [facility];  Service: Urology;  Laterality: Left;  . stent to kidney  04-29-2007  . SUBDURAL HEMORRHAGE DRAINAGE      drainage per boreholes only 12-12-03  . TONSILLECTOMY  12-11-11   child  . TRANSURETHRAL RESECTION OF BLADDER TUMOR  12/21/2011   Procedure: TRANSURETHRAL RESECTION OF BLADDER TUMOR (TURBT);  Surgeon: [name], MD;  [facility]: [facility];  Service: Urology;  Laterality: N/A;     . TRANSURETHRAL RESECTION OF BLADDER TUMOR N/A 08/14/2013   Procedure: TRANSURETHRAL RESECTION OF BLADDER TUMOR (TURBT);  Surgeon: [name], MD;  [facility]: [facility];  Service: Urology;  Laterality: N/A;  . TRANSURETHRAL RESECTION OF BLADDER TUMOR N/A 07/23/2014   Procedure: TRANSURETHRAL RESECTION OF BLADDER TUMOR (TURBT) WITH MITOMYCIN INSTILLATION;  Surgeon: Raynelle Bring, MD;  [facility]: [facility];  Service: Urology;  Laterality: N/A;  . TRANSURETHRAL RESECTION OF BLADDER TUMOR N/A 05/13/2015   Procedure: TRANSURETHRAL RESECTION OF BLADDER TUMOR (TURBT);  Surgeon: Raynelle Bring, MD;  [facility]: [facility];  Service: Urology;  Laterality: N/A;  . TRANSURETHRAL RESECTION OF BLADDER TUMOR N/A 11/18/2015   Procedure: TRANSURETHRAL RESECTION OF BLADDER TUMOR (TURBT);  Surgeon: Raynelle Bring, MD;  [facility];  Service: Urology;  Laterality: N/A;  . TRANSURETHRAL RESECTION OF BLADDER TUMOR N/A 03/02/2016   Procedure: TRANSURETHRAL RESECTION OF BLADDER TUMOR (TURBT);  Surgeon: Raynelle Bring, MD;  [facility]: [facility];  Service: Urology;  Laterality: N/A;  . TRANSURETHRAL RESECTION OF BLADDER TUMOR N/A 09/28/2016   Procedure: TRANSURETHRAL RESECTION OF BLADDER TUMOR (TURBT);  Surgeon: Raynelle Bring, MD;  Location: [facility];  Service: Urology;  Laterality: N/A;  . TRANSURETHRAL RESECTION OF BLADDER TUMOR N/A 10/19/2016   Procedure: TRANSURETHRAL RESECTION OF BLADDER TUMOR (TURBT);  Surgeon: Raynelle Bring, MD;  Location: [facility];  Service: Urology;  Laterality: N/A;  NEEDS 30 MIN TOTAL FOR BOTH PROCEDURES  . TRANSURETHRAL RESECTION OF BLADDER TUMOR N/A 11/11/2017   Procedure: TRANSURETHRAL RESECTION OF BLADDER TUMOR (TURBT) WITH CYSTOSCOPY;  Surgeon:  Raynelle Bring, MD;  Location: [facility];  Service: Urology;  Laterality: N/A;  . UPPER GI ENDOSCOPY      MEDICATIONS: . dutasteride (AVODART) 0.5 MG capsule  . glipiZIDE (GLUCOTROL) 5 MG tablet  . hydrochlorothiazide (HYDRODIURIL) 25 MG tablet  . memantine (NAMENDA XR) 28 MG CP24 24 hr capsule  . metFORMIN (GLUCOPHAGE) 500 MG tablet  . metoprolol succinate (TOPROL-XL) 25 MG 24 hr tablet  . Multiple Vitamin (MULTIVITAMIN WITH MINERALS) TABS tablet  . MYRBETRIQ 50 MG TB24 tablet  . Omega-3 Fatty Acids (FISH OIL) 1200 MG CAPS  . ONETOUCH DELICA LANCETS 37C MISC  . perindopril (ACEON) 4 MG tablet  . saxagliptin HCl (ONGLYZA) 5 MG TABS tablet  . vitamin B-12 (CYANOCOBALAMIN) 500 MCG tablet   No current facility-administered medications for this encounter.     Maia Plan [facility] [phone] 10/13/18 9:59 AM

## 2018-10-14 NOTE — H&P (Signed)
Office Visit Report     09/20/2018   --------------------------------------------------------------------------------   [name] [name]  MRN: [id number]  PRIMARY CARE:  Arleta Creek, MD  DOB: Jun 21, 1940, 78 year old Male  REFERRING:  W Gareth Eagle, MD  SSN: [id number]  PROVIDER:  Raynelle Bring, M.D.    LOCATION:  [facility] [phone]   --------------------------------------------------------------------------------   CC/HPI: Urothelial carcinoma   [name] [name] is a 78 year old gentleman who was initially diagnosed with low grade, Ta urothelial carcinoma of the bladder in October 2007. He developed intermittent low grade, Ta disease treated with TURBT and intravesical chemotherapy until February 2016 when he was found to have a high grade, Ta lesion. He underwent induction BCG and received maintenance therapy but he was noted to have multiple recurrent tumors in October 2016. He again underwent a repeat induction BCG course but by the spring of 2017 had again developed a new high grade, Ta tumor indicating BCG refractory disease. I discussed options with Mr. and Mrs. Shinn. Based on his dementia, he was not felt to be an appropriate candidate for cystectomy and his wife did not want to proceed in that fashion. He was seen at [facility] by Dr. Ricky Ala and he underwent and induction course of intravesical gemcitabine in the fall of 2017. He developed two recurrences in the spring of 2018 and underwent repeat induction gemcitabine chemotherapy in the summer of 2018. He then received monthly maintenance gemcitabine from September 2018 through April 2019. He had a recurrence in April 2019 and a blue light cystoscopy in May 2019 was performed and TUR indicated recurrent high grade, Ta disease. He then underwent repeat intravesical gemcitabine induction. He remained disease free until the spring of 2020 when he was found to have recurrent, multifocal tumors. TUR was performed and he had multiple  high grade, Ta tumors posteriorly along with a tumor overlying the right ureteral orifice with right ureteral obstruction. A right ureteral stent was placed and TUR of this area revealed muscle invasive urothelial carcinoma with probable involvement of the intravesical right ureter.   He follows up today in continues to have significant daytime urinary frequency and nocturia 2 times per night. This is likely related to his indwelling ureteral stent. He has not had any gross hematuria recently. He denies any fever. He denies any flank pain. He is here today with his wife to discuss options for further treatment.     ALLERGIES: Aricept Nitrofurantoin Monohyd Macro CAPS Sulfa Drugs Tetracyclines    MEDICATIONS: Metoprolol Succinate 25 mg tablet, extended release 24 hr  Avodart 0.5 mg capsule 0 Oral  Fish Oil CAPS Oral  Glipizide  Hydrochlorothiazide 25 mg tablet Oral  MetFORMIN HCl - 500 MG Oral Tablet Oral  Namenda Xr 28 mg capsule sprinkle, extended release 24 hr Oral  Onglyza 5 mg tablet Oral  Perindopril Erbumine 4 MG Oral Tablet Oral  Vitamin B-12 100 mcg tablet Oral     GU PSH: Bladder Instill AntiCA Agent - 2016 Catheterize For Residual - 03/23/2018 Cysto Bladder Ureth Biopsy - 10/19/2016 Cysto Uretero Lithotripsy - 2010 Cystoscopy - 08/10/2018, 03/23/2018, 09/24/2017, 05/21/2017, 02/19/2017, 2018 Cystoscopy Fulguration - 2010 Cystoscopy Insert Stent, Right - 08/29/2018, 2015, 2010, 2010, 2008 Cystoscopy TURBT <2 cm - 08/29/2018, 11/11/2017, 09/28/2016, 2017, 2015, 2013 Cystoscopy TURBT 2-5 cm - 2017, 2016, 2016 Cystoscopy Ureteroscopy, Right - 08/29/2018, Left - 09/28/2016, 2015 Percut Stone Removal >2cm - 2015 Ureteroscopic stone removal - 2008  PSH Notes: Cystoscopy With Fulguration Medium Lesion (2-5cm), Cystoscopy With Fulguration Medium Lesion (2-5cm), Bladder Injection Of Cancer Treatment, Percutaneous Lithotomy For Stone Over 2cm., Cystoscopy With Insertion Of Ureteral Stent  Left, Cystoscopy With Ureteroscopy Left, Cystoscopy With Fulguration Small Lesion (5-35m), Cystoscopy With Fulguration Small Lesion (5-238m, Cystoscopy With Pyeloscopy With Lithotripsy, Cystoscopy With Insertion Of Ureteral Stent Right, Cystoscopy With Fulguration, Cystoscopy With Insertion Of Ureteral Stent Right, Cystoscopy With Ureteroscopy With Removal Of Calculus, Cystoscopy With Insertion Of Ureteral Stent Left   NON-GU PSH: None   GU PMH: Bladder Cancer overlapping sites - 2017, Malignant neoplasm of overlapping sites of bladder, - 2017 Urinary Urgency, Urinary urgency - 2016 BPH w/LUTS, Benign prostatic hyperplasia (BPH) with straining on urination - 2016 Renal calculus, Nephrolithiasis - 2016      PMH Notes:   TrAristides Luckeys a [age]ear old previously followed by Dr. RoTresa EndoHe has the following urologic history:    1) Bladder cancer: He was initially diagnosed with a low-grade Ta urothelial carcinoma of the bladder in 2007 and was treated with a TURBT. He was noted to have a recurrence in 2013 and underwent TURBT in July 2013. He was also incidentally noted to have a stricture of the distal left ureter on RPG studies at that time and a brush biopsy was performed. He was found to have high grade Ta urothelial carcinoma in February 2016.    Oct 2007: TURBT, Low grade Ta  Jul 2013: TURBT (right bladder neck 1.5 cm tumor, low grade Ta),brush biopsy of distal left ureteral stricture (no malignancy)  Mar 2015: TURBT - Low grade Ta  [facility]  Jul 2016: Maintenance BCG  Oct 2016: Multiple recurrent tumors noted on surveillance cystoscopy  Dec 2016: TUR - Multiple tumors over right posterior bladder - High grade, Ta  Dec 2016 - Feb 2017: 6 week induction BCG  May 2017: TURBT - High grade, Ta urothelial carcinoma (two tumors)  Oct 2017: [facility] Ni[facility] Apr  2018: Two small recurrences (each < 1 cm)  Apr 2018: TURBT- High grade Ta, concern for possible lamina propria invasion for tumor at dome  May 2018: Repeat TURBT - No residual malignancy  Jul-Aug 2018: [facility] Sep 2018 - Apr 2019: Monthly maintenance gemcitabine  May 2019: Blue light cystoscopy with TURBT for 2 recurrent tumors posteriorly - High grade, Ta  Jun-Jul 2019: Repeat induction intravesical gemcitabine    2) Nephrolithiasis: He has a long standing history of calcium oxalate stones. He was incidentally noted to have a large partial staghorn calculus during retrograde pyelography in February 2015.  Apr 2015: L PCNL   3) Prostate cancer screening: We discontinued screening in 2014 due to his low risk for prostate cancer at that time and progressive dementia.  Last PSA: 0.35 (June 2014)   4) BPH/LUTS: He is treated with Avodart.     NON-GU PMH: Bacteriuria (Stable) - 09/02/2018, (Stable), - 02/19/2017 (Stable), - 10/14/2016, - 2018 Diabetes Type 2 Hypertension Unspecified dementia without behavioral disturbance    FAMILY HISTORY: No pertinent family history - Other Prostate Cancer - No Family History   SOCIAL HISTORY: Marital Status: Married Preferred Language: English; Ethnicity: Not Hispanic Or Latino; Race: White Current Smoking Status: Patient has never smoked.  Has never drank.  Drinks 2 caffeinated drinks per day.  Has not had a blood transfusion.     Notes: Never A Smoker   REVIEW OF SYSTEMS:    GU Review Male:   Patient reports frequent urination. Patient denies hard to postpone urination, burning/ pain with urination, get up at night to urinate, leakage of urine, stream starts and stops, trouble starting your streams, and have to strain to urinate .  Gastrointestinal (Upper):   Patient denies nausea and vomiting.  Gastrointestinal (Lower):   Patient denies diarrhea and constipation.   Constitutional:   Patient denies fever, night sweats, weight loss, and fatigue.  Skin:   Patient denies skin rash/ lesion and itching.  Eyes:   Patient denies blurred vision and double vision.  Ears/ Nose/ Throat:   Patient denies sore throat and sinus problems.  Hematologic/Lymphatic:   Patient denies swollen glands and easy bruising.  Cardiovascular:   Patient denies leg swelling and chest pains.  Respiratory:   Patient denies cough and shortness of breath.  Endocrine:   Patient denies excessive thirst.  Musculoskeletal:   Patient denies back pain and joint pain.  Neurological:   Patient denies headaches and dizziness.  Psychologic:   Patient denies depression and anxiety.   VITAL SIGNS:      09/20/2018 10:52 AM  Weight 195 lb / 88.45 kg  Height 70 in / 177.8 cm  BP 136/76 mmHg  Pulse 101 /min  BMI 28.0 kg/m   MULTI-SYSTEM PHYSICAL EXAMINATION:    Constitutional: Well-nourished. No physical deformities. Normally developed. Good grooming.     PAST DATA REVIEWED:  Source Of History:  Patient  Records Review:   Pathology Reports, Previous Patient Records  Urine Test Review:   Urinalysis   11/23/12 11/18/11 11/28/10 01/08/10 12/20/03  PSA  Total PSA 0.35  0.42  0.46  0.49  2.42     PROCEDURES:          Urinalysis w/Scope Dipstick Dipstick Cont'd Micro  Color: Amber Bilirubin: Neg mg/dL WBC/hpf: 10 - 20/hpf  Appearance: Cloudy Ketones: 1+ mg/dL RBC/hpf: >60/hpf  Specific Gravity: 1.020 Blood: 3+ ery/uL Bacteria: Few (10-25/hpf)  pH: 5.5 Protein: 3+ mg/dL Cystals: NS (Not Seen)  Glucose: Trace mg/dL Urobilinogen: 0.2 mg/dL Casts: NS (Not Seen)    Nitrites: Neg Trichomonas: Not Present    Leukocyte Esterase: 2+ leu/uL Mucous: Not Present      Epithelial Cells: 0 - 5/hpf      Yeast: NS (Not Seen)      Sperm: Not Present    Notes: microscopic [name] on unconcentrated urine    ASSESSMENT:      ICD-10 Details  1 GU:   Bladder Cancer overlapping sites - C67.8   2    Ureteral obstruction - N13.1   4   Urinary Frequency - R35.0   3 NON-GU:   Bacteriuria - R82.71 Stable   PLAN:            Medications New Meds: Myrbetriq 50 mg tablet, extended release 24 hr 1 tablet PO Daily   #30  11 Refill(s)            Orders Labs CMP, Urine Culture  X-Ray Notes: ...          Schedule X-Rays: 1 Week - C.T. Chest/ Abd/Pelvis With I.V. Contrast - Next available. This week if possible.  Return Visit/Planned Activity: Other See Visit Notes             Note: Will call to schedule surgery.  Document Letter(s):  Created for Patient: Clinical Summary         Notes:   1. Muscle invasive urothelial carcinoma the bladder and distal right ureter: I had a discussion with Mr. and Mrs. [name] today. Considering his pathology report with evidence of muscle invasive tumor, it was explained that continuing with intravesical therapies this point would not be appropriate. We did discuss the natural history of muscle invasive bladder cancer and they understand the significant risk for progression to metastatic disease which would be an incurable situation. We did discuss the need to proceed with a full staging evaluation with imaging of the chest, abdomen, and pelvis. This will be scheduled for the near future. He also will have his renal function recheck considering his recent development of right ureteral obstruction.   Considering the natural history of muscle invasive bladder cancer, we discussed options for treatment. Previously, his wife did not wish to proceed with radical cystectomy after he had developed BCG refractory CIS and high-grade urothelial carcinoma. We again discussed the approach of neoadjuvant chemotherapy along with radical cystectomy and urinary diversion as the standard of care treatment for muscle invasive bladder cancer. We reviewed that procedure in detail today including the potential risks. His wife continues to have significant concern based on his  dementia that he would not be able to deal with any kind of urinary diversion appliance very well. He also would not be able to undergo intermittent catheterization. She therefore does not think he will be able to tolerate the change in quality of life that would be necessary following urinary diversion. Overall, his health generally remains relatively good otherwise. I do think he could tolerate a radical cystectomy from a medical standpoint. The bigger concern continues to be how he would manage his appliance and his wife has significant concern about how that would go.   Based on her desire to avoid radical cystectomy and urinary diversion, we did discuss the alternative of maximal debulking with transurethral resection followed by an approach of combination chemotherapy/radiation therapy. We did review some of the risks of this treatment including the likelihood that he may continue to have right ureteral obstruction requiring stent management, the risk of developing worsening lower urinary tract symptoms or hematuria, etc. Regardless, his wife feels this would be a much more palatable option considering this would be a fairly minimal change in how he currently lives his day-to-day life.   Our current plan is therefore for him to complete his staging evaluation. He will be presented at the upcoming GU multidisciplinary conference for further discussion. If appropriate and he does not have evidence of metastatic disease, we currently will plan to consider a consultation with Dr. Alen Blew and Dr. Tammi Klippel to consider chemotherapy/radiation with a repeat TUR debulking procedure prior.   2. Right ureteral obstruction: We will continue with right ureteral stent management. He has been provided a prescription for Myrbetriq today to see if this can help manage his urinary frequency and nocturia symptoms. His urine has been cultured. If he does undergo TUR debulking in the future, I will plan to changes ureteral  stent at that time. Otherwise, he would be due for ureteral stent change in the early summer.   Cc: Dr. Gareth Eagle  Dr. Zola Button  Dr. Tyler Pita        Next Appointment:      Next Appointment: 09/21/2018 10:45 AM    Appointment Type: CT Scan    Location: [facility] -  [phone]    Provider: CT CT    Reason for Visit: CT chest/abd/pelvis with con/ Alinda Money /bladder ca, ureteral obstruction      E & M CODE: I spent at least 38 minutes face to face with the patient, more than 50% of that time was spent on counseling and/or coordinating care.     * Signed by Raynelle Bring, M.D. on 09/20/18 at 5:29 PM (EDT)*       APPENDED NOTES:  I reviewed his CT scan. He has no findings to suggest locally advanced or metastatic disease. We did discuss the incidental findings of bronchiectasis. He does have a history of having a prior atypical lung infection in his 48s or 49s. He currently has no pulmonary symptoms or underlying problems.   As per our prior discussion, in the absence of metastatic disease, his wife would like to avoid radical cystectomy. We will therefore schedule a consultation with Dr. Tammi Klippel and Dr. Alen Blew to discuss an approach of chemotherapy and radiation. I would likely proceed with ureteral stent change and tumor debulking with a repeat TUR prior to starting therapy assuming that is the way they wish to proceed.      Signed by Raynelle Bring, M.D. on 09/23/18 at 9:14 AM (EDT)*        [name] [name] has met with Dr. Alen Blew and Dr. Tammi Klippel and has elected to proceed with chemotherapy and radiation. He will proceed with a completion transurethral resection to optimally debulk his tumor and to change his ureteral stent. I will try to perform right ureteroscopy as well to ensure that his tumor is as debulked as possible.     * Signed by Raynelle Bring, M.D. on 10/04/18 at 10:28 AM (EDT)*

## 2018-10-15 ENCOUNTER — Other Ambulatory Visit: Payer: Self-pay | Admitting: Urology

## 2018-10-15 DIAGNOSIS — R350 Frequency of micturition: Secondary | ICD-10-CM

## 2018-10-15 MED ORDER — CEPHALEXIN 500 MG PO CAPS: 500.0000 mg | ORAL_CAPSULE | Freq: Two times a day (BID) | ORAL | 0 refills | Status: AC

## 2018-10-15 NOTE — Progress Notes (Signed)
Patient's wife called on call urology regarding husband's new onset urinary frequency. He is otherwise feeling fine and does not have a fever. Given surgery with Dr. Alinda Money on Monday, empirically prescribed keflex for likely UTI to sterilize urine pre-operatively. Reviewed ED precautions. All question answered.

## 2018-10-17 ENCOUNTER — Ambulatory Visit (HOSPITAL_COMMUNITY): Payer: Medicare Other | Admitting: Anesthesiology

## 2018-10-17 ENCOUNTER — Ambulatory Visit (HOSPITAL_COMMUNITY): Payer: Medicare Other | Admitting: Physician Assistant

## 2018-10-17 ENCOUNTER — Other Ambulatory Visit: Payer: Self-pay

## 2018-10-17 ENCOUNTER — Ambulatory Visit (HOSPITAL_COMMUNITY): Payer: Medicare Other

## 2018-10-17 ENCOUNTER — Encounter (HOSPITAL_COMMUNITY): Payer: Self-pay | Admitting: *Deleted

## 2018-10-17 ENCOUNTER — Ambulatory Visit (HOSPITAL_COMMUNITY)
Admission: RE | Admit: 2018-10-17 | Discharge: 2018-10-17 | Disposition: A | Discharge status: Home / Self Care | Payer: Medicare Other | Attending: Urology | Admitting: Urology

## 2018-10-17 ENCOUNTER — Encounter (HOSPITAL_COMMUNITY)
Admission: RE | Disposition: A | Discharge status: Home / Self Care | Payer: Self-pay | Source: Home / Self Care | Attending: Urology

## 2018-10-17 DIAGNOSIS — Z7984 Long term (current) use of oral hypoglycemic drugs: Secondary | ICD-10-CM | POA: Diagnosis not present

## 2018-10-17 DIAGNOSIS — C678 Malignant neoplasm of overlapping sites of bladder: Secondary | ICD-10-CM | POA: Insufficient documentation

## 2018-10-17 DIAGNOSIS — R350 Frequency of micturition: Secondary | ICD-10-CM | POA: Diagnosis not present

## 2018-10-17 DIAGNOSIS — Z882 Allergy status to sulfonamides status: Secondary | ICD-10-CM | POA: Diagnosis not present

## 2018-10-17 DIAGNOSIS — F0390 Unspecified dementia without behavioral disturbance: Secondary | ICD-10-CM | POA: Diagnosis not present

## 2018-10-17 DIAGNOSIS — N401 Enlarged prostate with lower urinary tract symptoms: Secondary | ICD-10-CM | POA: Insufficient documentation

## 2018-10-17 DIAGNOSIS — R351 Nocturia: Secondary | ICD-10-CM | POA: Diagnosis not present

## 2018-10-17 DIAGNOSIS — Z881 Allergy status to other antibiotic agents status: Secondary | ICD-10-CM | POA: Insufficient documentation

## 2018-10-17 DIAGNOSIS — C679 Malignant neoplasm of bladder, unspecified: Secondary | ICD-10-CM | POA: Diagnosis not present

## 2018-10-17 DIAGNOSIS — N135 Crossing vessel and stricture of ureter without hydronephrosis: Secondary | ICD-10-CM | POA: Insufficient documentation

## 2018-10-17 DIAGNOSIS — I10 Essential (primary) hypertension: Secondary | ICD-10-CM | POA: Insufficient documentation

## 2018-10-17 DIAGNOSIS — E119 Type 2 diabetes mellitus without complications: Secondary | ICD-10-CM | POA: Diagnosis not present

## 2018-10-17 DIAGNOSIS — T83091A Other mechanical complication of indwelling urethral catheter, initial encounter: Secondary | ICD-10-CM | POA: Diagnosis not present

## 2018-10-17 DIAGNOSIS — Z79899 Other long term (current) drug therapy: Secondary | ICD-10-CM | POA: Diagnosis not present

## 2018-10-17 HISTORY — PX: TRANSURETHRAL RESECTION OF BLADDER TUMOR: SHX2575

## 2018-10-17 LAB — GLUCOSE, CAPILLARY
Glucose-Capillary: 219 mg/dL — ABNORMAL HIGH (ref 70–99)
Glucose-Capillary: 206 mg/dL — ABNORMAL HIGH (ref 70–99)

## 2018-10-17 SURGERY — TURBT (TRANSURETHRAL RESECTION OF BLADDER TUMOR)
Anesthesia: General

## 2018-10-17 MED ORDER — DEXAMETHASONE SODIUM PHOSPHATE 10 MG/ML IJ SOLN
INTRAMUSCULAR | Status: AC
  Filled 2018-10-17: qty 1

## 2018-10-17 MED ORDER — ROCURONIUM BROMIDE 10 MG/ML (PF) SYRINGE
PREFILLED_SYRINGE | INTRAVENOUS | Status: DC | PRN
  Administered 2018-10-17: 30 mg via INTRAVENOUS

## 2018-10-17 MED ORDER — LIDOCAINE 2% (20 MG/ML) 5 ML SYRINGE
INTRAMUSCULAR | Status: DC | PRN
  Administered 2018-10-17: 60 mg via INTRAVENOUS

## 2018-10-17 MED ORDER — FENTANYL CITRATE (PF) 250 MCG/5ML IJ SOLN
INTRAMUSCULAR | Status: AC
  Filled 2018-10-17: qty 5

## 2018-10-17 MED ORDER — SUGAMMADEX SODIUM 200 MG/2ML IV SOLN
INTRAVENOUS | Status: DC | PRN
  Administered 2018-10-17: 400 mg via INTRAVENOUS

## 2018-10-17 MED ORDER — ONDANSETRON HCL 4 MG/2ML IJ SOLN
INTRAMUSCULAR | Status: DC | PRN
  Administered 2018-10-17: 4 mg via INTRAVENOUS

## 2018-10-17 MED ORDER — PHENYLEPHRINE 40 MCG/ML (10ML) SYRINGE FOR IV PUSH (FOR BLOOD PRESSURE SUPPORT)
PREFILLED_SYRINGE | INTRAVENOUS | Status: DC | PRN
  Administered 2018-10-17: 80 ug via INTRAVENOUS

## 2018-10-17 MED ORDER — LIDOCAINE 2% (20 MG/ML) 5 ML SYRINGE
INTRAMUSCULAR | Status: AC
  Filled 2018-10-17: qty 5

## 2018-10-17 MED ORDER — ACETAMINOPHEN 10 MG/ML IV SOLN: 1000.0000 mg | Freq: Once | INTRAVENOUS | Status: DC | PRN

## 2018-10-17 MED ORDER — SODIUM CHLORIDE 0.9 % IR SOLN
Status: DC | PRN
  Administered 2018-10-17: 9000 mL

## 2018-10-17 MED ORDER — ROCURONIUM BROMIDE 10 MG/ML (PF) SYRINGE
PREFILLED_SYRINGE | INTRAVENOUS | Status: AC
  Filled 2018-10-17: qty 10

## 2018-10-17 MED ORDER — PROPOFOL 10 MG/ML IV BOLUS
INTRAVENOUS | Status: AC
  Filled 2018-10-17: qty 20

## 2018-10-17 MED ORDER — PROMETHAZINE HCL 25 MG/ML IJ SOLN: 6.2500 mg | INTRAMUSCULAR | Status: DC | PRN

## 2018-10-17 MED ORDER — OXYCODONE HCL 5 MG PO TABS: 5.0000 mg | ORAL_TABLET | Freq: Once | ORAL | Status: DC | PRN

## 2018-10-17 MED ORDER — PHENYLEPHRINE 40 MCG/ML (10ML) SYRINGE FOR IV PUSH (FOR BLOOD PRESSURE SUPPORT)
PREFILLED_SYRINGE | INTRAVENOUS | Status: AC
  Filled 2018-10-17: qty 20

## 2018-10-17 MED ORDER — OXYCODONE HCL 5 MG/5ML PO SOLN: 5.0000 mg | Freq: Once | ORAL | Status: DC | PRN

## 2018-10-17 MED ORDER — SUCCINYLCHOLINE CHLORIDE 200 MG/10ML IV SOSY
PREFILLED_SYRINGE | INTRAVENOUS | Status: DC | PRN
  Administered 2018-10-17: 100 mg via INTRAVENOUS

## 2018-10-17 MED ORDER — FENTANYL CITRATE (PF) 100 MCG/2ML IJ SOLN: 25.0000 ug | INTRAMUSCULAR | Status: DC | PRN

## 2018-10-17 MED ORDER — CEFAZOLIN SODIUM-DEXTROSE 2-4 GM/100ML-% IV SOLN
2.0000 g | Freq: Once | INTRAVENOUS | Status: AC
  Administered 2018-10-17: 2 g via INTRAVENOUS
  Filled 2018-10-17: qty 100

## 2018-10-17 MED ORDER — ONDANSETRON HCL 4 MG/2ML IJ SOLN
INTRAMUSCULAR | Status: AC
  Filled 2018-10-17: qty 2

## 2018-10-17 MED ORDER — SUCCINYLCHOLINE CHLORIDE 200 MG/10ML IV SOSY
PREFILLED_SYRINGE | INTRAVENOUS | Status: AC
  Filled 2018-10-17: qty 10

## 2018-10-17 MED ORDER — FENTANYL CITRATE (PF) 250 MCG/5ML IJ SOLN
INTRAMUSCULAR | Status: DC | PRN
  Administered 2018-10-17 (×5): 50 ug via INTRAVENOUS

## 2018-10-17 MED ORDER — SUGAMMADEX SODIUM 500 MG/5ML IV SOLN
INTRAVENOUS | Status: AC
  Filled 2018-10-17: qty 5

## 2018-10-17 MED ORDER — LACTATED RINGERS IV SOLN
INTRAVENOUS | Status: DC
  Administered 2018-10-17 (×2): via INTRAVENOUS

## 2018-10-17 MED ORDER — PROPOFOL 10 MG/ML IV BOLUS
INTRAVENOUS | Status: DC | PRN
  Administered 2018-10-17: 150 mg via INTRAVENOUS

## 2018-10-17 SURGICAL SUPPLY — 17 items
BAG URINE DRAINAGE (UROLOGICAL SUPPLIES) IMPLANT
BAG URO CATCHER STRL LF (MISCELLANEOUS) ×3 IMPLANT
COVER WAND RF STERILE (DRAPES) IMPLANT
ELECT REM PT RETURN 15FT ADLT (MISCELLANEOUS) ×3 IMPLANT
GLOVE BIOGEL M STRL SZ7.5 (GLOVE) ×3 IMPLANT
GOWN STRL REUS W/TWL LRG LVL3 (GOWN DISPOSABLE) ×3 IMPLANT
GUIDEWIRE STR DUAL SENSOR (WIRE) ×3 IMPLANT
KIT TURNOVER KIT A (KITS) IMPLANT
LOOP CUT BIPOLAR 24F LRG (ELECTROSURGICAL) ×3 IMPLANT
MANIFOLD NEPTUNE II (INSTRUMENTS) ×3 IMPLANT
PACK CYSTO (CUSTOM PROCEDURE TRAY) ×3 IMPLANT
SET ASPIRATION TUBING (TUBING) IMPLANT
STENT URO INLAY 6FRX26CM (STENTS) ×3 IMPLANT
SYRINGE IRR TOOMEY STRL 70CC (SYRINGE) ×3 IMPLANT
TUBING CONNECTING 10 (TUBING) ×2 IMPLANT
TUBING CONNECTING 10' (TUBING) ×1
TUBING UROLOGY SET (TUBING) ×3 IMPLANT

## 2018-10-17 NOTE — Discharge Instructions (Signed)

## 2018-10-17 NOTE — Anesthesia Procedure Notes (Signed)
Procedure Name: Intubation Date/Time: 10/17/2018 12:04 PM Performed by: Mitzie Na, CRNA Pre-anesthesia Checklist: Patient identified, Emergency Drugs available, Suction available, Patient being monitored and Timeout performed Patient Re-evaluated:Patient Re-evaluated prior to induction Oxygen Delivery Method: Circle system utilized Preoxygenation: Pre-oxygenation with 100% oxygen Induction Type: IV induction and Rapid sequence Laryngoscope Size: Mac and 4 Grade View: Grade I Tube type: Oral Tube size: 7.5 mm Number of attempts: 1 Airway Equipment and Method: Stylet Placement Confirmation: ETT inserted through vocal cords under direct vision,  positive ETCO2 and breath sounds checked- equal and bilateral Secured at: 24 cm Tube secured with: Tape Dental Injury: Teeth and Oropharynx as per pre-operative assessment

## 2018-10-17 NOTE — Interval H&P Note (Signed)
History and Physical Interval Note:  10/17/2018 11:27 AM  [name] [name]  has presented today for surgery, with the diagnosis of BLADDER CANCER.  The various methods of treatment have been discussed with the patient and family. After consideration of risks, benefits and other options for treatment, the patient has consented to  Procedure(s) with comments: TRANSURETHRAL RESECTION OF BLADDER TUMOR (TURBT) WITH CYSTOSCOPY, RIGHT URETEROSCOPY, POSSIBLE URETEROSCOPIC RESECTION, RIGHT URETERAL STENT CHANGE (N/A) - GENERAL ANESTHESIA WITH POSSIBLE PARALYSIS as a surgical intervention.  The patient's history has been reviewed, patient examined, no change in status, stable for surgery.  I have reviewed the patient's chart and labs.  Questions were answered to the patient's satisfaction.     [employer]

## 2018-10-17 NOTE — Op Note (Signed)
Preoperative diagnosis: Muscle invasive urothelial carcinoma of the bladder  Postoperative diagnosis: Muscle invasive urothelial carcinoma of the bladder  Procedures: 1.  Cystoscopy 2.  Right ureteroscopy 3.  Right ureteral stent change (6 x 26 Bard inlay Optima) 4.  Transurethral resection of bladder tumor (2.5 cm)  Surgeon: Pryor Curia MD  Anesthesia: General  Complications: None  EBL: Minimal  Intraoperative findings: The patient's indwelling ureteral stent placed a little over 1 month ago was significantly encrusted and would not allow the wire to be inserted all the way through the stent.  Specimen: Bladder tumor  Disposition of specimen: Pathology  Indication: [name] [name] is a 78 year old gentleman with muscle invasive urothelial carcinoma of the bladder.  He is not a candidate for radical cystectomy and has elected to proceed with chemotherapy and radiation therapy for definitive treatment.  He returns today to undergo a tumor debulking procedure in preparation for that treatment.  The potential risks, complications, and the expected recovery process was discussed in detail.  Informed consent was obtained.  Description of procedure: Patient was taken to the operating room and a general anesthetic was administered.  He was given preoperative antibiotics, placed in the dorsolithotomy position, and prepped and draped in usual sterile fashion.  Next, a preoperative timeout was performed.  Cystourethroscopy was then performed demonstrating a normal anterior and posterior urethra.  He did have a slightly high bladder neck which was easily navigated with the cystoscope.  Inspection of the bladder revealed an indwelling right ureteral stent that did appear to be moderately encrusted.  The prior tumor resection sites were noted.  No new tumor growth was noted.  The indwelling stent was then brought out to the urethral meatus with a flexible grasper.  An attempt was made to insert the  wire through the ureteral stent but it was too encrusted to allow passage.  Therefore the stent was removed and the wire was then reinserted into the right ureteral orifice and up into the right renal collecting system under fluoroscopic guidance.  The 6 French semirigid ureteroscope was then advanced next to the wire and the distal ureter was inspected.  There was noted to be some edema and some changes within the very distal aspect of the intravesical ureter although no definite tumor was noted.  The ureteroscope was then removed and the cystoscope was replaced.  A new 6 x 26 Bard inlay Optima stent was inserted over the wire using Seldinger technique and positioned appropriately under fluoroscopic and cystoscopic guidance.  The wire was removed with a good curl noted in the renal pelvis as well as within the bladder.  The cystoscope was then removed and replaced with the 26 French resectoscope.  This was placed under direct vision with the visual obturator.  Using loop cutting resection, the previous area of muscle invasive tumor surrounding the right ureteral orifice and right hemitrigone was resected with any abnormal appearing tissue removed until there was no abnormal visual tissue identified.  Hemostasis was achieved with loop cautery.  All tumor specimen was removed with the aid of a Toomey syringe.  Reinspection of the bladder revealed excellent hemostasis.  Bladder was emptied and the procedure was ended.  He tolerated the procedure well without complications.  He was able to be extubated and transferred to recovery unit in satisfactory condition.

## 2018-10-17 NOTE — Transfer of Care (Signed)
Immediate Anesthesia Transfer of Care Note  Patient: [name] [name]  Procedure(s) Performed: TRANSURETHRAL RESECTION OF BLADDER TUMOR (TURBT) WITH CYSTOSCOPY, RIGHT URETEROSCOPY, RIGHT URETERAL STENT CHANGE (N/A )  Patient Location: PACU  Anesthesia Type:General  Level of Consciousness: awake, alert , oriented and patient cooperative  Airway & Oxygen Therapy: Patient Spontanous Breathing and Patient connected to face mask oxygen  Post-op Assessment: Report given to RN, Post -op Vital signs reviewed and stable and Patient moving all extremities  Post vital signs: Reviewed and stable  Last Vitals:  Vitals Value Taken Time  BP 159/84 10/17/2018  1:04 PM  Temp 36.9 C 10/17/2018  1:04 PM  Pulse 85 10/17/2018  1:10 PM  Resp 11 10/17/2018  1:10 PM  SpO2 100 % 10/17/2018  1:10 PM  Vitals shown include unvalidated device data.  Last Pain:  Vitals:   10/17/18 0938  TempSrc: Oral         Complications: No apparent anesthesia complications

## 2018-10-17 NOTE — Anesthesia Postprocedure Evaluation (Signed)
Anesthesia Post Note  Patient: [name] [name]  Procedure(s) Performed: TRANSURETHRAL RESECTION OF BLADDER TUMOR (TURBT) WITH CYSTOSCOPY, RIGHT URETEROSCOPY, RIGHT URETERAL STENT CHANGE (N/A )     Patient location during evaluation: PACU Anesthesia Type: General Level of consciousness: awake and alert Pain management: pain level controlled Vital Signs Assessment: post-procedure vital signs reviewed and stable Respiratory status: spontaneous breathing, nonlabored ventilation and respiratory function stable Cardiovascular status: blood pressure returned to baseline and stable Postop Assessment: no apparent nausea or vomiting Anesthetic complications: no    Last Vitals:  Vitals:   10/17/18 1315 10/17/18 1330  BP: (!) 143/62 (!) 150/79  Pulse: 83 85  Resp: 11 11  Temp:  36.8 C  SpO2: 100% 92%    Last Pain:  Vitals:   10/17/18 1330  TempSrc:   PainSc: 0-No pain                 Brennan Bailey

## 2018-10-18 ENCOUNTER — Encounter (HOSPITAL_COMMUNITY): Payer: Self-pay | Admitting: Urology

## 2018-10-25 DIAGNOSIS — I10 Essential (primary) hypertension: Secondary | ICD-10-CM | POA: Diagnosis not present

## 2018-10-25 DIAGNOSIS — E1165 Type 2 diabetes mellitus with hyperglycemia: Secondary | ICD-10-CM | POA: Diagnosis not present

## 2018-10-25 DIAGNOSIS — E782 Mixed hyperlipidemia: Secondary | ICD-10-CM | POA: Diagnosis not present

## 2018-10-25 DIAGNOSIS — C678 Malignant neoplasm of overlapping sites of bladder: Secondary | ICD-10-CM | POA: Diagnosis not present

## 2018-10-25 DIAGNOSIS — R350 Frequency of micturition: Secondary | ICD-10-CM | POA: Diagnosis not present

## 2018-10-25 DIAGNOSIS — E118 Type 2 diabetes mellitus with unspecified complications: Secondary | ICD-10-CM | POA: Diagnosis not present

## 2018-10-27 ENCOUNTER — Other Ambulatory Visit: Payer: Self-pay | Admitting: Oncology

## 2018-10-27 DIAGNOSIS — C679 Malignant neoplasm of bladder, unspecified: Secondary | ICD-10-CM

## 2018-10-27 MED ORDER — PROCHLORPERAZINE MALEATE 10 MG PO TABS: 10.0000 mg | ORAL_TABLET | Freq: Four times a day (QID) | ORAL | 0 refills | Status: DC | PRN

## 2018-10-27 NOTE — Progress Notes (Signed)
START OFF PATHWAY REGIMEN - Bladder   [id number]:Carboplatin AUC=2:   Administer weekly:     Carboplatin   **Always confirm dose/schedule in your pharmacy ordering system**  Patient Characteristics: Pre-Cystectomy or Nonsurgical Candidate (Clinical Staging), cT2-4a, cN0-1, M0, Cystectomy Ineligible Therapeutic Status: Pre-Cystectomy or Nonsurgical Candidate (Clinical Staging) AJCC M Category: cM0 AJCC 8 Stage Grouping: II AJCC T Category: cT2 AJCC N Category: cN0 Intent of Therapy: Curative Intent, Discussed with Patient 

## 2018-10-31 ENCOUNTER — Telehealth: Payer: Self-pay | Admitting: Oncology

## 2018-10-31 ENCOUNTER — Encounter: Payer: Self-pay | Admitting: Podiatry

## 2018-10-31 ENCOUNTER — Ambulatory Visit (INDEPENDENT_AMBULATORY_CARE_PROVIDER_SITE_OTHER): Payer: Medicare Other | Admitting: Podiatry

## 2018-10-31 ENCOUNTER — Other Ambulatory Visit: Payer: Self-pay

## 2018-10-31 VITALS — Temp 97.7°F

## 2018-10-31 DIAGNOSIS — M79674 Pain in right toe(s): Secondary | ICD-10-CM | POA: Diagnosis not present

## 2018-10-31 DIAGNOSIS — M79675 Pain in left toe(s): Secondary | ICD-10-CM

## 2018-10-31 DIAGNOSIS — B351 Tinea unguium: Secondary | ICD-10-CM | POA: Diagnosis not present

## 2018-10-31 NOTE — Telephone Encounter (Signed)
Scheduled appt per 5/14 sch message - pts wife is aware of appt date and time

## 2018-10-31 NOTE — Patient Instructions (Signed)
Diabetes Mellitus and Foot Care  Foot care is an important part of your health, especially when you have diabetes. Diabetes may cause you to have problems because of poor blood flow (circulation) to your feet and legs, which can cause your skin to:   Become thinner and drier.   Break more easily.   Heal more slowly.   Peel and crack.  You may also have nerve damage (neuropathy) in your legs and feet, causing decreased feeling in them. This means that you may not notice minor injuries to your feet that could lead to more serious problems. Noticing and addressing any potential problems early is the best way to prevent future foot problems.  How to care for your feet  Foot hygiene   Wash your feet daily with warm water and mild soap. Do not use hot water. Then, pat your feet and the areas between your toes until they are completely dry. Do not soak your feet as this can dry your skin.   Trim your toenails straight across. Do not dig under them or around the cuticle. File the edges of your nails with an emery board or nail file.   Apply a moisturizing lotion or petroleum jelly to the skin on your feet and to dry, brittle toenails. Use lotion that does not contain alcohol and is unscented. Do not apply lotion between your toes.  Shoes and socks   Wear clean socks or stockings every day. Make sure they are not too tight. Do not wear knee-high stockings since they may decrease blood flow to your legs.   Wear shoes that fit properly and have enough cushioning. Always look in your shoes before you put them on to be sure there are no objects inside.   To break in new shoes, wear them for just a few hours a day. This prevents injuries on your feet.  Wounds, scrapes, corns, and calluses   Check your feet daily for blisters, cuts, bruises, sores, and redness. If you cannot see the bottom of your feet, use a mirror or ask someone for help.   Do not cut corns or calluses or try to remove them with medicine.   If you  find a minor scrape, cut, or break in the skin on your feet, keep it and the skin around it clean and dry. You may clean these areas with mild soap and water. Do not clean the area with peroxide, alcohol, or iodine.   If you have a wound, scrape, corn, or callus on your foot, look at it several times a day to make sure it is healing and not infected. Check for:  ? Redness, swelling, or pain.  ? Fluid or blood.  ? Warmth.  ? Pus or a bad smell.  General instructions   Do not cross your legs. This may decrease blood flow to your feet.   Do not use heating pads or hot water bottles on your feet. They may burn your skin. If you have lost feeling in your feet or legs, you may not know this is happening until it is too late.   Protect your feet from hot and cold by wearing shoes, such as at the beach or on hot pavement.   Schedule a complete foot exam at least once a year (annually) or more often if you have foot problems. If you have foot problems, report any cuts, sores, or bruises to your health care provider immediately.  Contact a health care provider if:     You have a medical condition that increases your risk of infection and you have any cuts, sores, or bruises on your feet.   You have an injury that is not healing.   You have redness on your legs or feet.   You feel burning or tingling in your legs or feet.   You have pain or cramps in your legs and feet.   Your legs or feet are numb.   Your feet always feel cold.   You have pain around a toenail.  Get help right away if:   You have a wound, scrape, corn, or callus on your foot and:  ? You have pain, swelling, or redness that gets worse.  ? You have fluid or blood coming from the wound, scrape, corn, or callus.  ? Your wound, scrape, corn, or callus feels warm to the touch.  ? You have pus or a bad smell coming from the wound, scrape, corn, or callus.  ? You have a fever.  ? You have a red line going up your leg.  Summary   Check your feet every day  for cuts, sores, red spots, swelling, and blisters.   Moisturize feet and legs daily.   Wear shoes that fit properly and have enough cushioning.   If you have foot problems, report any cuts, sores, or bruises to your health care provider immediately.   Schedule a complete foot exam at least once a year (annually) or more often if you have foot problems.  This information is not intended to replace advice given to you by your health care provider. Make sure you discuss any questions you have with your health care provider.  Document Released: 05/29/2000 Document Revised: 07/14/2017 Document Reviewed: 07/03/2016  Elsevier Interactive Patient Education  2019 [employer]

## 2018-11-01 ENCOUNTER — Ambulatory Visit
Admission: RE | Admit: 2018-11-01 | Discharge: 2018-11-01 | Disposition: A | Discharge status: Home / Self Care | Payer: Medicare Other | Source: Ambulatory Visit | Attending: Radiation Oncology | Admitting: Radiation Oncology

## 2018-11-01 ENCOUNTER — Other Ambulatory Visit: Payer: Self-pay

## 2018-11-01 DIAGNOSIS — I10 Essential (primary) hypertension: Secondary | ICD-10-CM | POA: Diagnosis not present

## 2018-11-01 DIAGNOSIS — Z7189 Other specified counseling: Secondary | ICD-10-CM | POA: Diagnosis not present

## 2018-11-01 DIAGNOSIS — C670 Malignant neoplasm of trigone of bladder: Secondary | ICD-10-CM | POA: Diagnosis not present

## 2018-11-01 DIAGNOSIS — C672 Malignant neoplasm of lateral wall of bladder: Secondary | ICD-10-CM | POA: Insufficient documentation

## 2018-11-01 DIAGNOSIS — E782 Mixed hyperlipidemia: Secondary | ICD-10-CM | POA: Diagnosis not present

## 2018-11-01 DIAGNOSIS — Z510 Encounter for antineoplastic radiation therapy: Secondary | ICD-10-CM | POA: Diagnosis not present

## 2018-11-01 DIAGNOSIS — F0390 Unspecified dementia without behavioral disturbance: Secondary | ICD-10-CM | POA: Diagnosis not present

## 2018-11-01 DIAGNOSIS — E1165 Type 2 diabetes mellitus with hyperglycemia: Secondary | ICD-10-CM | POA: Diagnosis not present

## 2018-11-01 NOTE — Progress Notes (Signed)
Due to patient's dementia, Dr. Tammi Klippel gave approval to have his wife enter the radiation department to assist with simulation and sign consent.  Ledon Weihe denied recent international or out of state travel. She denied new cough, fever, shortness of breath. Her temperature was 98.7. She was escorted to simulation by Amy radiation therapist.

## 2018-11-02 ENCOUNTER — Inpatient Hospital Stay: Payer: Medicare Other | Attending: Oncology

## 2018-11-02 ENCOUNTER — Telehealth: Payer: Self-pay | Admitting: *Deleted

## 2018-11-04 NOTE — Progress Notes (Signed)
  Radiation Oncology         [phone] ________________________________  Name: [name] [name] MRN: [id number]  Date: 11/01/2018  DOB: [birth year]  SIMULATION AND TREATMENT PLANNING NOTE    ICD-10-CM   1. Cancer of lateral wall of urinary bladder (HCC) C67.2     DIAGNOSIS:  78 y.o. gentleman with muscle invasive urothelial carcinoma of the bladder involving the distal right ureter  NARRATIVE:  The patient was brought to the [facility].  Identity was confirmed.  All relevant records and images related to the planned course of therapy were reviewed.  The patient freely provided informed written consent to proceed with treatment after reviewing the details related to the planned course of therapy. The consent form was witnessed and verified by the simulation staff.  Then, the patient was set-up in a stable reproducible  supine position for radiation therapy.  Contrast was instilled into the bladder with a catheter under sterile conditions.  CT images were obtained.  Surface markings were placed.  The CT images were loaded into the planning software.  Then the target and avoidance structures were contoured.  Treatment planning then occurred.  The radiation prescription was entered and confirmed.  Then, I designed and supervised the construction of a total of 5 medically necessary complex treatment devices including VacLoc body positioner and 4 MLCs to shield the bowel and femoral necks.  I have requested : 3D Simulation  I have requested a DVH of the following structures: small bowel, rectum, left femoral head, right femoral head and targets.  SPECIAL TREATMENT PROCEDURE:  The planned course of therapy using radiation constitutes a special treatment procedure. Special care is required in the management of this patient for the following reasons. This treatment constitutes a Special Treatment Procedure for the following reason: [ Concurrent chemotherapy requiring careful monitoring  for increased toxicities of treatment including weekly laboratory values..  The special nature of the planned course of radiotherapy will require increased physician supervision and oversight to ensure patient's safety with optimal treatment outcomes.  PLAN:  The patient will receive 45 Gy in 25 fractions to the bladder and pelvic nodes, followed by a boost to the bladder tumor to a total dose of 64.8 Gy.  ________________________________  Sheral Apley Tammi Klippel, M.D.   This document serves as a record of services personally performed by Tyler Pita, MD. It was created on his behalf by Wilburn Mylar, a trained medical scribe. The creation of this record is based on the scribe's personal observations and the provider's statements to them. This document has been checked and approved by the attending provider.

## 2018-11-07 ENCOUNTER — Encounter: Payer: Self-pay | Admitting: Podiatry

## 2018-11-07 NOTE — Progress Notes (Addendum)
Subjective:  [name] [name] presents to clinic today with cc of  painful, thick, discolored, elongated toenails 1-5 b/l that become tender and cannot cut because of thickness.  Pain is aggravated when wearing enclosed shoe gear.  [name] Seashore, MD is his PCP.   Wife states [name] [name] cancer has returned and he is being scheduled for treatment in a couple of weeks.   Current Outpatient Medications:  .  dutasteride (AVODART) 0.5 MG capsule, Take 0.5 mg by mouth daily. , Disp: , Rfl:  .  glipiZIDE (GLUCOTROL) 5 MG tablet, Take 2.5 mg by mouth daily before breakfast. , Disp: , Rfl:  .  hydrochlorothiazide (HYDRODIURIL) 25 MG tablet, Take 25 mg by mouth daily. , Disp: , Rfl:  .  memantine (NAMENDA XR) 28 MG CP24 24 hr capsule, Take 28 mg by mouth every morning. , Disp: , Rfl:  .  metFORMIN (GLUCOPHAGE) 500 MG tablet, Take 1,000 mg by mouth 2 (two) times daily with a meal., Disp: , Rfl:  .  metoprolol succinate (TOPROL-XL) 25 MG 24 hr tablet, Take 25 mg by mouth daily. , Disp: , Rfl:  .  Multiple Vitamin (MULTIVITAMIN WITH MINERALS) TABS tablet, Take 1 tablet by mouth every evening., Disp: , Rfl:  .  MYRBETRIQ 50 MG TB24 tablet, Take 50 mg by mouth daily., Disp: , Rfl:  .  Omega-3 Fatty Acids (FISH OIL) 1200 MG CAPS, Take 1,200 mg by mouth every evening., Disp: , Rfl:  .  ONETOUCH DELICA LANCETS 56O MISC, 2 (two) times daily. as directed, Disp: , Rfl: 4 .  perindopril (ACEON) 4 MG tablet, Take 4 mg by mouth daily. , Disp: , Rfl:  .  prochlorperazine (COMPAZINE) 10 MG tablet, Take 1 tablet (10 mg total) by mouth every 6 (six) hours as needed for nausea or vomiting., Disp: 30 tablet, Rfl: 0 .  saxagliptin HCl (ONGLYZA) 5 MG TABS tablet, Take 5 mg by mouth daily., Disp: , Rfl:  .  trospium (SANCTURA) 20 MG tablet, Take 20 mg by mouth 2 (two) times daily., Disp: , Rfl:  .  vitamin B-12 (CYANOCOBALAMIN) 500 MCG tablet, Take 500 mcg by mouth every evening., Disp: , Rfl:    Allergies   Allergen Reactions  . Sulfa Antibiotics Hives  . Tetracyclines & Related Rash  . Aricept [Donepezil Hcl] Nausea And Vomiting     Objective: Vitals:   10/31/18 1346  Temp: 97.7 F (36.5 C)    Physical Examination:  Neurovascular Examination: Capillary refill time immediate x 10 digits.  Palpable DP/PT pulses b/l.  Digital hair sparse b/l.  No edema noted b/l.  Skin temperature gradient WNL b/l.  Dermatological Examination: Skin with normal turgor, texture and tone b/l.  No open wounds b/l.  No interdigital macerations noted b/l.  Elongated, thick, discolored brittle toenails with subungual debris and pain on dorsal palpation of nailbeds 1-5 b/l.  Musculoskeletal Examination: Muscle strength 5/5 to all muscle groups b/l.  No gross bony deformities b/l.  No pain, crepitus or joint discomfort with active/passive ROM.  Neurological Examination: Sensation intact 5/5 b/l with 10 gram monofilament.  Vibratory sensation intact b/l.  Proprioceptive sensation intact b/l.  Assessment: Mycotic nail infection with pain 1-5 b/l  Plan: 1. Toenails 1-5 b/l were debrided in length and girth without iatrogenic laceration. 2.  Continue soft, supportive shoe gear daily. 3.  Report any pedal injuries to medical professional. 4.  Follow up 3 months. 5.  Patient/POA to call should there be a question/concern in  there interim.

## 2018-11-08 DIAGNOSIS — C672 Malignant neoplasm of lateral wall of bladder: Secondary | ICD-10-CM | POA: Diagnosis not present

## 2018-11-08 DIAGNOSIS — C670 Malignant neoplasm of trigone of bladder: Secondary | ICD-10-CM | POA: Diagnosis not present

## 2018-11-08 DIAGNOSIS — Z510 Encounter for antineoplastic radiation therapy: Secondary | ICD-10-CM | POA: Diagnosis not present

## 2018-11-09 ENCOUNTER — Ambulatory Visit
Admission: RE | Admit: 2018-11-09 | Discharge: 2018-11-09 | Disposition: A | Discharge status: Home / Self Care | Payer: Medicare Other | Source: Ambulatory Visit | Attending: Radiation Oncology | Admitting: Radiation Oncology

## 2018-11-09 DIAGNOSIS — C670 Malignant neoplasm of trigone of bladder: Secondary | ICD-10-CM | POA: Diagnosis not present

## 2018-11-09 DIAGNOSIS — C672 Malignant neoplasm of lateral wall of bladder: Secondary | ICD-10-CM | POA: Diagnosis not present

## 2018-11-09 DIAGNOSIS — Z510 Encounter for antineoplastic radiation therapy: Secondary | ICD-10-CM | POA: Diagnosis not present

## 2018-11-09 NOTE — Progress Notes (Signed)
Patient afebrile. Wife's temperature 99. Both deny development of new cough. Both escorted back to machine 1 by [name], RT.

## 2018-11-10 ENCOUNTER — Ambulatory Visit
Admission: RE | Admit: 2018-11-10 | Discharge: 2018-11-10 | Disposition: A | Discharge status: Home / Self Care | Payer: Medicare Other | Source: Ambulatory Visit | Attending: Radiation Oncology | Admitting: Radiation Oncology

## 2018-11-10 DIAGNOSIS — C672 Malignant neoplasm of lateral wall of bladder: Secondary | ICD-10-CM | POA: Diagnosis not present

## 2018-11-10 DIAGNOSIS — Z510 Encounter for antineoplastic radiation therapy: Secondary | ICD-10-CM | POA: Diagnosis not present

## 2018-11-10 DIAGNOSIS — C670 Malignant neoplasm of trigone of bladder: Secondary | ICD-10-CM | POA: Diagnosis not present

## 2018-11-11 ENCOUNTER — Ambulatory Visit
Admission: RE | Admit: 2018-11-11 | Discharge: 2018-11-11 | Disposition: A | Discharge status: Home / Self Care | Payer: Medicare Other | Source: Ambulatory Visit | Attending: Radiation Oncology | Admitting: Radiation Oncology

## 2018-11-11 ENCOUNTER — Other Ambulatory Visit: Payer: Self-pay

## 2018-11-11 DIAGNOSIS — Z510 Encounter for antineoplastic radiation therapy: Secondary | ICD-10-CM | POA: Diagnosis not present

## 2018-11-11 DIAGNOSIS — C670 Malignant neoplasm of trigone of bladder: Secondary | ICD-10-CM | POA: Diagnosis not present

## 2018-11-11 DIAGNOSIS — C672 Malignant neoplasm of lateral wall of bladder: Secondary | ICD-10-CM | POA: Diagnosis not present

## 2018-11-11 NOTE — Progress Notes (Signed)
Patient afebrile. Wife's temperature 98.6. Both deny development of new cough. Both escorted back to machine 1 for treatment.

## 2018-11-14 ENCOUNTER — Ambulatory Visit
Admission: RE | Admit: 2018-11-14 | Discharge: 2018-11-14 | Disposition: A | Discharge status: Home / Self Care | Payer: Medicare Other | Source: Ambulatory Visit | Attending: Radiation Oncology | Admitting: Radiation Oncology

## 2018-11-14 DIAGNOSIS — Z510 Encounter for antineoplastic radiation therapy: Secondary | ICD-10-CM | POA: Diagnosis not present

## 2018-11-14 DIAGNOSIS — C672 Malignant neoplasm of lateral wall of bladder: Secondary | ICD-10-CM | POA: Diagnosis not present

## 2018-11-14 DIAGNOSIS — H34811 Central retinal vein occlusion, right eye, with macular edema: Secondary | ICD-10-CM | POA: Diagnosis not present

## 2018-11-14 DIAGNOSIS — C670 Malignant neoplasm of trigone of bladder: Secondary | ICD-10-CM | POA: Diagnosis not present

## 2018-11-14 NOTE — Progress Notes (Signed)
Patient afebrile. Wife's temperature 98.1. Both deny development of new cough. Both escorted back to machine 1 for treatment.

## 2018-11-15 ENCOUNTER — Inpatient Hospital Stay: Payer: Medicare Other | Attending: Oncology

## 2018-11-15 ENCOUNTER — Other Ambulatory Visit: Payer: Self-pay

## 2018-11-15 ENCOUNTER — Ambulatory Visit
Admission: RE | Admit: 2018-11-15 | Discharge: 2018-11-15 | Disposition: A | Discharge status: Home / Self Care | Payer: Medicare Other | Source: Ambulatory Visit | Attending: Radiation Oncology | Admitting: Radiation Oncology

## 2018-11-15 ENCOUNTER — Inpatient Hospital Stay (HOSPITAL_BASED_OUTPATIENT_CLINIC_OR_DEPARTMENT_OTHER): Payer: Medicare Other | Admitting: Oncology

## 2018-11-15 ENCOUNTER — Inpatient Hospital Stay: Payer: Medicare Other

## 2018-11-15 VITALS — BP 121/76 | HR 100 | Temp 98.5°F | Resp 18 | Ht 70.0 in | Wt 188.9 lb

## 2018-11-15 DIAGNOSIS — F0390 Unspecified dementia without behavioral disturbance: Secondary | ICD-10-CM

## 2018-11-15 DIAGNOSIS — R5383 Other fatigue: Secondary | ICD-10-CM | POA: Diagnosis not present

## 2018-11-15 DIAGNOSIS — C679 Malignant neoplasm of bladder, unspecified: Secondary | ICD-10-CM

## 2018-11-15 DIAGNOSIS — Z7984 Long term (current) use of oral hypoglycemic drugs: Secondary | ICD-10-CM | POA: Insufficient documentation

## 2018-11-15 DIAGNOSIS — Z510 Encounter for antineoplastic radiation therapy: Secondary | ICD-10-CM | POA: Diagnosis not present

## 2018-11-15 DIAGNOSIS — R634 Abnormal weight loss: Secondary | ICD-10-CM | POA: Diagnosis not present

## 2018-11-15 DIAGNOSIS — Z5111 Encounter for antineoplastic chemotherapy: Secondary | ICD-10-CM | POA: Insufficient documentation

## 2018-11-15 DIAGNOSIS — R300 Dysuria: Secondary | ICD-10-CM | POA: Diagnosis not present

## 2018-11-15 DIAGNOSIS — Z79899 Other long term (current) drug therapy: Secondary | ICD-10-CM | POA: Diagnosis not present

## 2018-11-15 DIAGNOSIS — R630 Anorexia: Secondary | ICD-10-CM | POA: Diagnosis not present

## 2018-11-15 DIAGNOSIS — C672 Malignant neoplasm of lateral wall of bladder: Secondary | ICD-10-CM

## 2018-11-15 DIAGNOSIS — C670 Malignant neoplasm of trigone of bladder: Secondary | ICD-10-CM | POA: Diagnosis not present

## 2018-11-15 LAB — CBC WITH DIFFERENTIAL (CANCER CENTER ONLY)
Abs Immature Granulocytes: 0.07 10*3/uL (ref 0.00–0.07)
Basophils Absolute: 0.1 10*3/uL (ref 0.0–0.1)
Basophils Relative: 1 %
Eosinophils Absolute: 0.3 10*3/uL (ref 0.0–0.5)
Eosinophils Relative: 3 %
HCT: 38.2 % — ABNORMAL LOW (ref 39.0–52.0)
Hemoglobin: 12.7 g/dL — ABNORMAL LOW (ref 13.0–17.0)
Immature Granulocytes: 1 %
Lymphocytes Relative: 12 %
Lymphs Abs: 0.9 10*3/uL (ref 0.7–4.0)
MCH: 28.3 pg (ref 26.0–34.0)
MCHC: 33.2 g/dL (ref 30.0–36.0)
MCV: 85.1 fL (ref 80.0–100.0)
Monocytes Absolute: 0.7 10*3/uL (ref 0.1–1.0)
Monocytes Relative: 9 %
Neutro Abs: 5.9 10*3/uL (ref 1.7–7.7)
Neutrophils Relative %: 74 %
Platelet Count: 263 10*3/uL (ref 150–400)
RBC: 4.49 MIL/uL (ref 4.22–5.81)
RDW: 11.8 % (ref 11.5–15.5)
WBC Count: 7.9 10*3/uL (ref 4.0–10.5)
nRBC: 0 % (ref 0.0–0.2)

## 2018-11-15 LAB — CMP (CANCER CENTER ONLY)
ALT: 8 U/L (ref 0–44)
AST: 11 U/L — ABNORMAL LOW (ref 15–41)
Albumin: 3.4 g/dL — ABNORMAL LOW (ref 3.5–5.0)
Alkaline Phosphatase: 62 U/L (ref 38–126)
Anion gap: 11 (ref 5–15)
BUN: 18 mg/dL (ref 8–23)
CO2: 27 mmol/L (ref 22–32)
Calcium: 9.1 mg/dL (ref 8.9–10.3)
Chloride: 102 mmol/L (ref 98–111)
Creatinine: 0.94 mg/dL (ref 0.61–1.24)
GFR, Est AFR Am: >60 mL/min (ref >60)
GFR, Estimated: >60 mL/min (ref >60)
Glucose, Bld: 209 mg/dL — ABNORMAL HIGH (ref 70–99)
Potassium: 3.8 mmol/L (ref 3.5–5.1)
Sodium: 140 mmol/L (ref 135–145)
Total Bilirubin: 0.7 mg/dL (ref 0.3–1.2)
Total Protein: 6.6 g/dL (ref 6.5–8.1)

## 2018-11-15 MED ORDER — SODIUM CHLORIDE 0.9 % IV SOLN: 10.0000 mg | Freq: Once | INTRAVENOUS | Status: DC

## 2018-11-15 MED ORDER — PALONOSETRON HCL INJECTION 0.25 MG/5ML
INTRAVENOUS | Status: AC
  Filled 2018-11-15: qty 5

## 2018-11-15 MED ORDER — DEXAMETHASONE SODIUM PHOSPHATE 10 MG/ML IJ SOLN
INTRAMUSCULAR | Status: AC
  Filled 2018-11-15: qty 1

## 2018-11-15 MED ORDER — SODIUM CHLORIDE 0.9 % IV SOLN
Freq: Once | INTRAVENOUS | Status: AC
  Administered 2018-11-15: 10:00:00 via INTRAVENOUS
  Filled 2018-11-15: qty 250

## 2018-11-15 MED ORDER — SODIUM CHLORIDE 0.9 % IV SOLN
200.0000 mg | Freq: Once | INTRAVENOUS | Status: AC
  Administered 2018-11-15: 200 mg via INTRAVENOUS
  Filled 2018-11-15: qty 20

## 2018-11-15 MED ORDER — PALONOSETRON HCL INJECTION 0.25 MG/5ML
0.2500 mg | Freq: Once | INTRAVENOUS | Status: AC
  Administered 2018-11-15: 10:00:00 0.25 mg via INTRAVENOUS

## 2018-11-15 MED ORDER — DEXAMETHASONE SODIUM PHOSPHATE 10 MG/ML IJ SOLN
10.0000 mg | Freq: Once | INTRAMUSCULAR | Status: AC
  Administered 2018-11-15: 10 mg via INTRAVENOUS

## 2018-11-15 NOTE — Patient Instructions (Signed)
Coronavirus (COVID-19) Are you at risk?  Are you at risk for the Coronavirus (COVID-19)?  To be considered HIGH RISK for Coronavirus (COVID-19), you have to meet the following criteria:  . Traveled to [place], [place], [place], [place] or [place]; or in the [place] to [place], [place], [place], or [place]; and have fever, cough, and shortness of breath within the last 2 weeks of travel OR . Been in close contact with a person diagnosed with COVID-19 within the last 2 weeks and have fever, cough, and shortness of breath . IF YOU DO NOT MEET THESE CRITERIA, YOU ARE CONSIDERED LOW RISK FOR COVID-19.  What to do if you are HIGH RISK for COVID-19?  . If you are having a medical emergency, call 911. . Seek medical care right away. Before you go to a doctor's office, urgent care or emergency department, call ahead and tell them about your recent travel, contact with someone diagnosed with COVID-19, and your symptoms. You should receive instructions from your physician's office regarding next steps of care.  . When you arrive at healthcare provider, tell the healthcare staff immediately you have returned from visiting [place], [place], [place], [place] or [place]; or traveled in the [place] to [place], [place], [place], or [place]; in the last two weeks or you have been in close contact with a person diagnosed with COVID-19 in the last 2 weeks.   . Tell the health care staff about your symptoms: fever, cough and shortness of breath. . After you have been seen by a medical provider, you will be either: o Tested for (COVID-19) and discharged home on quarantine except to seek medical care if symptoms worsen, and asked to  - Stay home and avoid contact with others until you get your results (4-5 days)  - Avoid travel on public transportation if possible (such as bus, train, or airplane) or o Sent to the Emergency Department by EMS for evaluation, COVID-19 testing, and possible  admission depending on your condition and test results.  What to do if you are LOW RISK for COVID-19?  Reduce your risk of any infection by using the same precautions used for avoiding the common cold or flu:  . Wash your hands often with soap and warm water for at least 20 seconds.  If soap and water are not readily available, use an alcohol-based hand sanitizer with at least 60% alcohol.  . If coughing or sneezing, cover your mouth and nose by coughing or sneezing into the elbow areas of your shirt or coat, into a tissue or into your sleeve (not your hands). . Avoid shaking hands with others and consider head nods or verbal greetings only. . Avoid touching your eyes, nose, or mouth with unwashed hands.  . Avoid close contact with people who are sick. . Avoid places or events with large numbers of people in one location, like concerts or sporting events. . Carefully consider travel plans you have or are making. . If you are planning any travel outside or inside the [place], visit the CDC's Travelers' Health webpage for the latest health notices. . If you have some symptoms but not all symptoms, continue to monitor at home and seek medical attention if your symptoms worsen. . If you are having a medical emergency, call 911.   ADDITIONAL HEALTHCARE OPTIONS FOR PATIENTS  [facility] / e-Visit: [email or web address]         [facility]: [phone]  Onset   Urgent Care: [facility]: [facility] Discharge Instructions for Patients Receiving Chemotherapy  Today you received the following chemotherapy agents :  Carboplatin.  To help prevent nausea and vomiting after your treatment, we encourage you to take your nausea medication as prescribed.  TAKE COMPAZINE 10 MG EVERY 6 HOURS AS NEEDED FOR NAUSEA.  MAY CAUSE DROWSINESS.   If you develop nausea  and vomiting that is not controlled by your nausea medication, call the clinic.   BELOW ARE SYMPTOMS THAT SHOULD BE REPORTED IMMEDIATELY:  *FEVER GREATER THAN 100.5 F  *CHILLS WITH OR WITHOUT FEVER  NAUSEA AND VOMITING THAT IS NOT CONTROLLED WITH YOUR NAUSEA MEDICATION  *UNUSUAL SHORTNESS OF BREATH  *UNUSUAL BRUISING OR BLEEDING  TENDERNESS IN MOUTH AND THROAT WITH OR WITHOUT PRESENCE OF ULCERS  *URINARY PROBLEMS  *BOWEL PROBLEMS  UNUSUAL RASH Items with * indicate a potential emergency and should be followed up as soon as possible.  Feel free to call the clinic should you have any questions or concerns. The clinic phone number is [phone].  Please show the [facility] at check-in to the Emergency Department and triage nurse.

## 2018-11-15 NOTE — Progress Notes (Signed)
Spoke with pt & wife this am before treatment & gave Pt Edu Packet.  Reviewed information in packet & asked if any questions.  Chemo Alert Card given with instructions.  Pt has trouble hearing & has some dementia but wife expressed understanding. Reminded to call if questions.

## 2018-11-15 NOTE — Progress Notes (Signed)
Hematology and Oncology Follow Up Visit  [name] [name] [id number] Jul 04, 1940 78 y.o. 11/15/2018 8:50 AM [name] [name], MDRamachandran, [name], MD   Principle Diagnosis: High-grade urothelial carcinoma of the bladder diagnosed in March 2020.  He has bladder confined disease with muscle invasion indicating at least T2 N0 disease.   Prior Therapy:   He is status post cystoscopy and repeat TURBT on May 4 of 2020 under the care of Dr. Alinda Money.  Which showed no muscle invasion.  Is status post TURBT on August 29, 2018 which showed muscle invasive high-grade urothelial carcinoma.  Current therapy: He is receiving definitive therapy with radiation and weekly carboplatin with cycle 1 to be given on 11/15/2018.  Interim History: [name] presents today for follow-up.  Since the last visit, he underwent a repeat TURBT under the care of Dr. Alinda Money on May 4 of 2020.  He has tolerated the procedure very well without any complications.  He started radiation treatment without any recent issues.  He denies any abdominal pain, diarrhea or dysuria.  He does not report any headaches, blurry vision, syncope or seizures. Does not report any fevers, chills or sweats.  Does not report any cough, wheezing or hemoptysis.  Does not report any chest pain, palpitation, orthopnea or leg edema.  Does not report any nausea, vomiting or abdominal pain.  Does not report any constipation or diarrhea.  Does not report any skeletal complaints.    Does not report frequency, urgency or hematuria.  Does not report any skin rashes or lesions. Does not report any heat or cold intolerance.  Does not report any lymphadenopathy or petechiae.  Does not report any anxiety or depression.  Remaining review of systems is negative.    Medications: I have reviewed the patient's current medications.  Current Outpatient Medications  Medication Sig Dispense Refill  . dutasteride (AVODART) 0.5 MG capsule Take 0.5 mg by mouth daily.     [name] [name]  glipiZIDE (GLUCOTROL) 5 MG tablet Take 2.5 mg by mouth daily before breakfast.     . hydrochlorothiazide (HYDRODIURIL) 25 MG tablet Take 25 mg by mouth daily.     . memantine (NAMENDA XR) 28 MG CP24 24 hr capsule Take 28 mg by mouth every morning.     . metFORMIN (GLUCOPHAGE) 500 MG tablet Take 1,000 mg by mouth 2 (two) times daily with a meal.    . metoprolol succinate (TOPROL-XL) 25 MG 24 hr tablet Take 25 mg by mouth daily.     . Multiple Vitamin (MULTIVITAMIN WITH MINERALS) TABS tablet Take 1 tablet by mouth every evening.    [name] [name] MYRBETRIQ 50 MG TB24 tablet Take 50 mg by mouth daily.    . Omega-3 Fatty Acids (FISH OIL) 1200 MG CAPS Take 1,200 mg by mouth every evening.    [name] [name] DELICA LANCETS 08M MISC 2 (two) times daily. as directed  4  . perindopril (ACEON) 4 MG tablet Take 4 mg by mouth daily.     . prochlorperazine (COMPAZINE) 10 MG tablet Take 1 tablet (10 mg total) by mouth every 6 (six) hours as needed for nausea or vomiting. 30 tablet 0  . saxagliptin HCl (ONGLYZA) 5 MG TABS tablet Take 5 mg by mouth daily.    . trospium (SANCTURA) 20 MG tablet Take 20 mg by mouth 2 (two) times daily.    . vitamin B-12 (CYANOCOBALAMIN) 500 MCG tablet Take 500 mcg by mouth every evening.     No current facility-administered medications for this visit.  Allergies:  Allergies  Allergen Reactions  . Sulfa Antibiotics Hives  . Tetracyclines & Related Rash  . Aricept [Donepezil Hcl] Nausea And Vomiting    Past Medical History, Surgical history, Social history, and Family History were reviewed and updated.    Physical Exam: Blood pressure 121/76, pulse 100, temperature 98.5 F (36.9 C), temperature source Oral, resp. rate 18, height 5\' 10"  (1.778 m), weight 188 lb 14.4 oz (85.7 kg), SpO2 100 %.   ECOG: 1 General appearance: alert and cooperative appeared without distress. Head: Normocephalic, without obvious abnormality Oropharynx: No oral thrush or ulcers. Eyes: No scleral  icterus.  Pupils are equal and round reactive to light. Lymph nodes: Cervical, supraclavicular, and axillary nodes normal. Heart:regular rate and rhythm, S1, S2 normal, no murmur, click, rub or gallop Lung:chest clear, no wheezing, rales, normal symmetric air entry Abdomin: soft, non-tender, without masses or organomegaly. Neurological: No motor, sensory deficits.  Intact deep tendon reflexes. Skin: No rashes or lesions.  No ecchymosis or petechiae. Musculoskeletal: No joint deformity or effusion. Psychiatric: Mood and affect are appropriate.    Lab Results: Lab Results  Component Value Date   WBC 8.6 10/11/2018   HGB 13.7 10/11/2018   HCT 42.0 10/11/2018   MCV 87.0 10/11/2018   PLT 261 10/11/2018     Chemistry      Component Value Date/Time   NA 142 10/11/2018 1110   K 3.7 10/11/2018 1110   CL 105 10/11/2018 1110   CO2 27 10/11/2018 1110   BUN 23 10/11/2018 1110   CREATININE 0.88 10/11/2018 1110      Component Value Date/Time   CALCIUM 9.2 10/11/2018 1110   ALKPHOS 55 04/29/2007 1456   AST 21 04/29/2007 1456   ALT 16 04/29/2007 1456   BILITOT 1.5 (H) 04/29/2007 1456       Radiological Studies:  CT scan obtained on 09/21/2027 showed no evidence of metastatic disease in the chest abdomen and pelvis.  He has a right double-J stent with right hydronephrosis.  Impression and Plan:  78 year old with the following:  1.    Bladder cancer diagnosed in March 2020.  He was found to have muscle invasive high-grade urothelial carcinoma without any evidence of metastatic disease.  His disease status was updated and the natural course of this disease and treatment options were reiterated to the patient and his wife that accompanied him today.  He is not a candidate for radical cystectomy and currently receiving definitive therapy with radiation.  Risks and benefits of carboplatin chemotherapy were discussed again.  These complications: Nausea, fatigue, myelosuppression,  neutropenia and neutropenic sepsis.  After discussion he is agreeable to proceed and the plan is to complete 6 weeks of therapy.  2.  IV access: We will continue to use peripheral veins.  No need for a Port-A-Cath.  3.  Antiemetics: Compazine is available to him with instructions how to use it.  4.  Dementia: He has very limited comprehension of his diagnosis and treatment plan.  His wife is accompanying him today and has been involved in his care extensively.  5.  Follow-up will continue to follow weekly for his carboplatin treatment to complete 6 weeks.   25  minutes was spent with the patient face-to-face today.  More than 50% of time was spent on reviewing his disease status, treatment options, complications related therapy to plan of care.    Zola Button, MD 6/2/20208:50 AM

## 2018-11-16 ENCOUNTER — Telehealth: Payer: Self-pay | Admitting: *Deleted

## 2018-11-16 ENCOUNTER — Ambulatory Visit
Admission: RE | Admit: 2018-11-16 | Discharge: 2018-11-16 | Disposition: A | Discharge status: Home / Self Care | Payer: Medicare Other | Source: Ambulatory Visit | Attending: Radiation Oncology | Admitting: Radiation Oncology

## 2018-11-16 DIAGNOSIS — Z510 Encounter for antineoplastic radiation therapy: Secondary | ICD-10-CM | POA: Diagnosis not present

## 2018-11-16 DIAGNOSIS — C670 Malignant neoplasm of trigone of bladder: Secondary | ICD-10-CM | POA: Diagnosis not present

## 2018-11-16 DIAGNOSIS — C672 Malignant neoplasm of lateral wall of bladder: Secondary | ICD-10-CM | POA: Diagnosis not present

## 2018-11-16 NOTE — Telephone Encounter (Signed)
[place] for chemotherapy F/U.  Spoke with "wife [name], he has Alzheimer disease.  He actually is doing well.  Acting better today than others.  Denies n/v and I did not give him the nausea pill the nurse recommended.  Denies any new side effects or symptoms.  He goes to the bathroom a lot like he does everyday.  Has not told me anything about his bowels but I know his bladder is functioning well.  Eating and drinking well.  We have our 76 year old granddaughter helping."  Advised he should move bowels at least every three days.  Instructed to drink 64 oz minimum daily or at least the day before, of and after treatment.   Denies questions or needs at this time.  Encouraged to call [phone] Mon - Fri 8:00 am - 4:30 pm or anytime as needed for symptoms, changes or events outside office hours.

## 2018-11-16 NOTE — Telephone Encounter (Signed)
-----   Message from Jarvis Morgan, RN sent at 11/15/2018 10:16 AM EDT ----- Regarding: Chemo follow up First time Carboplatin, Dr. Acquanetta Chain, TB, RN.

## 2018-11-17 ENCOUNTER — Telehealth: Payer: Self-pay

## 2018-11-17 ENCOUNTER — Ambulatory Visit
Admission: RE | Admit: 2018-11-17 | Discharge: 2018-11-17 | Disposition: A | Discharge status: Home / Self Care | Payer: Medicare Other | Source: Ambulatory Visit | Attending: Radiation Oncology | Admitting: Radiation Oncology

## 2018-11-17 DIAGNOSIS — C672 Malignant neoplasm of lateral wall of bladder: Secondary | ICD-10-CM | POA: Diagnosis not present

## 2018-11-17 DIAGNOSIS — C670 Malignant neoplasm of trigone of bladder: Secondary | ICD-10-CM | POA: Diagnosis not present

## 2018-11-17 DIAGNOSIS — Z510 Encounter for antineoplastic radiation therapy: Secondary | ICD-10-CM | POA: Diagnosis not present

## 2018-11-17 NOTE — Telephone Encounter (Signed)
Follow up call to follow up on 1st time Carboplatin administration. Spoke with patient spouse [name] [name] as she stated that the patient has dementia and is unable to answer questions appropriately due to memory loss. She stated that he has been doing fine and she has no concerns. No c/o or s/s of N/V, constipation or diarrhea and she stated that he still has a good appetite. She is aware that if any questions or concerns arise to call [phone].

## 2018-11-18 ENCOUNTER — Ambulatory Visit
Admission: RE | Admit: 2018-11-18 | Discharge: 2018-11-18 | Disposition: A | Discharge status: Home / Self Care | Payer: Medicare Other | Source: Ambulatory Visit | Attending: Radiation Oncology | Admitting: Radiation Oncology

## 2018-11-18 DIAGNOSIS — C672 Malignant neoplasm of lateral wall of bladder: Secondary | ICD-10-CM | POA: Diagnosis not present

## 2018-11-18 DIAGNOSIS — C670 Malignant neoplasm of trigone of bladder: Secondary | ICD-10-CM | POA: Diagnosis not present

## 2018-11-18 DIAGNOSIS — Z510 Encounter for antineoplastic radiation therapy: Secondary | ICD-10-CM | POA: Diagnosis not present

## 2018-11-21 ENCOUNTER — Ambulatory Visit
Admission: RE | Admit: 2018-11-21 | Discharge: 2018-11-21 | Disposition: A | Discharge status: Home / Self Care | Payer: Medicare Other | Source: Ambulatory Visit | Attending: Radiation Oncology | Admitting: Radiation Oncology

## 2018-11-21 ENCOUNTER — Encounter: Payer: Self-pay | Admitting: Oncology

## 2018-11-21 DIAGNOSIS — C672 Malignant neoplasm of lateral wall of bladder: Secondary | ICD-10-CM | POA: Diagnosis not present

## 2018-11-21 DIAGNOSIS — C670 Malignant neoplasm of trigone of bladder: Secondary | ICD-10-CM | POA: Diagnosis not present

## 2018-11-21 DIAGNOSIS — Z510 Encounter for antineoplastic radiation therapy: Secondary | ICD-10-CM | POA: Diagnosis not present

## 2018-11-21 NOTE — Progress Notes (Signed)
Called pt and spoke to his wife because she said pt has Alzheimer's, to introduce myself as his Arboriculturist.  Pt has 2 insurances so copay assistance shouldn't be needed at this time.  I offered the [place] and went over what it covers.  She declined stating they are fine right now.  I left my name and Meredith's name in the radiation dept in case she changes her mind and would like to apply in the future.

## 2018-11-22 ENCOUNTER — Inpatient Hospital Stay: Payer: Medicare Other

## 2018-11-22 ENCOUNTER — Telehealth: Payer: Self-pay | Admitting: Radiation Oncology

## 2018-11-22 ENCOUNTER — Ambulatory Visit: Payer: Medicare Other

## 2018-11-22 NOTE — Telephone Encounter (Signed)
Understand from [place], RT that patient's wife, [name], called to cancel her husband's radiation treatment for today since he isn't well. Phoned [name] to inquire further. She explains that after eating lunch she discovered her husband shaking all over violently. She asked what was wrong and says he told her he was very cold. She reports moving him into his heated recliner and checking his temperature which proved to be 97.5 orally. She reports after he sat in his recliner for a bit covered in blankets he became calmer and shook less. Per Freeman Caldron, PA-C radiation for today should be cancelled. Wife questions if she should still bring her husband in for his chemotherapy. Private messaged Dr. Alen Blew. Explained to patient's wife that per Dr. Alen Blew they should NOT come in today and she should monitor him over night. Confirmed per Dr. Alen Blew there will be no schedule change and they should plan for his next chemotherapy to be on 11/29/2018 as scheduled. Wife understands to contact radiation oncology staff if they are unable to present for radiation treatment tomorrow as scheduled.

## 2018-11-23 ENCOUNTER — Ambulatory Visit
Admission: RE | Admit: 2018-11-23 | Discharge: 2018-11-23 | Disposition: A | Discharge status: Home / Self Care | Payer: Medicare Other | Source: Ambulatory Visit | Attending: Radiation Oncology | Admitting: Radiation Oncology

## 2018-11-23 DIAGNOSIS — Z510 Encounter for antineoplastic radiation therapy: Secondary | ICD-10-CM | POA: Diagnosis not present

## 2018-11-23 DIAGNOSIS — C672 Malignant neoplasm of lateral wall of bladder: Secondary | ICD-10-CM | POA: Diagnosis not present

## 2018-11-23 DIAGNOSIS — C670 Malignant neoplasm of trigone of bladder: Secondary | ICD-10-CM | POA: Diagnosis not present

## 2018-11-24 ENCOUNTER — Ambulatory Visit
Admission: RE | Admit: 2018-11-24 | Discharge: 2018-11-24 | Disposition: A | Discharge status: Home / Self Care | Payer: Medicare Other | Source: Ambulatory Visit | Attending: Radiation Oncology | Admitting: Radiation Oncology

## 2018-11-24 DIAGNOSIS — C670 Malignant neoplasm of trigone of bladder: Secondary | ICD-10-CM | POA: Diagnosis not present

## 2018-11-24 DIAGNOSIS — C672 Malignant neoplasm of lateral wall of bladder: Secondary | ICD-10-CM | POA: Diagnosis not present

## 2018-11-24 DIAGNOSIS — Z510 Encounter for antineoplastic radiation therapy: Secondary | ICD-10-CM | POA: Diagnosis not present

## 2018-11-25 ENCOUNTER — Ambulatory Visit
Admission: RE | Admit: 2018-11-25 | Discharge: 2018-11-25 | Disposition: A | Discharge status: Home / Self Care | Payer: Medicare Other | Source: Ambulatory Visit | Attending: Radiation Oncology | Admitting: Radiation Oncology

## 2018-11-25 DIAGNOSIS — Z510 Encounter for antineoplastic radiation therapy: Secondary | ICD-10-CM | POA: Diagnosis not present

## 2018-11-25 DIAGNOSIS — C670 Malignant neoplasm of trigone of bladder: Secondary | ICD-10-CM | POA: Diagnosis not present

## 2018-11-25 DIAGNOSIS — C672 Malignant neoplasm of lateral wall of bladder: Secondary | ICD-10-CM | POA: Diagnosis not present

## 2018-11-28 ENCOUNTER — Ambulatory Visit
Admission: RE | Admit: 2018-11-28 | Discharge: 2018-11-28 | Disposition: A | Discharge status: Home / Self Care | Payer: Medicare Other | Source: Ambulatory Visit | Attending: Radiation Oncology | Admitting: Radiation Oncology

## 2018-11-28 DIAGNOSIS — Z510 Encounter for antineoplastic radiation therapy: Secondary | ICD-10-CM | POA: Diagnosis not present

## 2018-11-28 DIAGNOSIS — C670 Malignant neoplasm of trigone of bladder: Secondary | ICD-10-CM | POA: Diagnosis not present

## 2018-11-28 DIAGNOSIS — C672 Malignant neoplasm of lateral wall of bladder: Secondary | ICD-10-CM | POA: Diagnosis not present

## 2018-11-29 ENCOUNTER — Inpatient Hospital Stay: Payer: Medicare Other

## 2018-11-29 ENCOUNTER — Other Ambulatory Visit: Payer: Self-pay

## 2018-11-29 ENCOUNTER — Ambulatory Visit
Admission: RE | Admit: 2018-11-29 | Discharge: 2018-11-29 | Disposition: A | Discharge status: Home / Self Care | Payer: Medicare Other | Source: Ambulatory Visit | Attending: Radiation Oncology | Admitting: Radiation Oncology

## 2018-11-29 VITALS — BP 144/74 | HR 77 | Temp 98.2°F | Resp 18

## 2018-11-29 DIAGNOSIS — C679 Malignant neoplasm of bladder, unspecified: Secondary | ICD-10-CM | POA: Diagnosis not present

## 2018-11-29 DIAGNOSIS — Z510 Encounter for antineoplastic radiation therapy: Secondary | ICD-10-CM | POA: Diagnosis not present

## 2018-11-29 DIAGNOSIS — F0390 Unspecified dementia without behavioral disturbance: Secondary | ICD-10-CM | POA: Diagnosis not present

## 2018-11-29 DIAGNOSIS — R300 Dysuria: Secondary | ICD-10-CM | POA: Diagnosis not present

## 2018-11-29 DIAGNOSIS — C670 Malignant neoplasm of trigone of bladder: Secondary | ICD-10-CM | POA: Diagnosis not present

## 2018-11-29 DIAGNOSIS — Z5111 Encounter for antineoplastic chemotherapy: Secondary | ICD-10-CM | POA: Diagnosis not present

## 2018-11-29 DIAGNOSIS — C672 Malignant neoplasm of lateral wall of bladder: Secondary | ICD-10-CM | POA: Diagnosis not present

## 2018-11-29 DIAGNOSIS — R634 Abnormal weight loss: Secondary | ICD-10-CM | POA: Diagnosis not present

## 2018-11-29 DIAGNOSIS — R5383 Other fatigue: Secondary | ICD-10-CM | POA: Diagnosis not present

## 2018-11-29 LAB — CMP (CANCER CENTER ONLY)
ALT: 13 U/L (ref 0–44)
AST: 16 U/L (ref 15–41)
Albumin: 3.6 g/dL (ref 3.5–5.0)
Alkaline Phosphatase: 57 U/L (ref 38–126)
Anion gap: 12 (ref 5–15)
BUN: 10 mg/dL (ref 8–23)
CO2: 24 mmol/L (ref 22–32)
Calcium: 9 mg/dL (ref 8.9–10.3)
Chloride: 104 mmol/L (ref 98–111)
Creatinine: 0.85 mg/dL (ref 0.61–1.24)
GFR, Est AFR Am: >60 mL/min (ref >60)
GFR, Estimated: >60 mL/min (ref >60)
Glucose, Bld: 178 mg/dL — ABNORMAL HIGH (ref 70–99)
Potassium: 3.8 mmol/L (ref 3.5–5.1)
Sodium: 140 mmol/L (ref 135–145)
Total Bilirubin: 0.7 mg/dL (ref 0.3–1.2)
Total Protein: 6.6 g/dL (ref 6.5–8.1)

## 2018-11-29 LAB — CBC WITH DIFFERENTIAL (CANCER CENTER ONLY)
Abs Immature Granulocytes: 0.04 10*3/uL (ref 0.00–0.07)
Basophils Absolute: 0 10*3/uL (ref 0.0–0.1)
Basophils Relative: 1 %
Eosinophils Absolute: 0.3 10*3/uL (ref 0.0–0.5)
Eosinophils Relative: 4 %
HCT: 37.2 % — ABNORMAL LOW (ref 39.0–52.0)
Hemoglobin: 12.3 g/dL — ABNORMAL LOW (ref 13.0–17.0)
Immature Granulocytes: 1 %
Lymphocytes Relative: 6 %
Lymphs Abs: 0.4 10*3/uL — ABNORMAL LOW (ref 0.7–4.0)
MCH: 28.3 pg (ref 26.0–34.0)
MCHC: 33.1 g/dL (ref 30.0–36.0)
MCV: 85.7 fL (ref 80.0–100.0)
Monocytes Absolute: 0.6 10*3/uL (ref 0.1–1.0)
Monocytes Relative: 10 %
Neutro Abs: 4.9 10*3/uL (ref 1.7–7.7)
Neutrophils Relative %: 78 %
Platelet Count: 158 10*3/uL (ref 150–400)
RBC: 4.34 MIL/uL (ref 4.22–5.81)
RDW: 12.6 % (ref 11.5–15.5)
WBC Count: 6.3 10*3/uL (ref 4.0–10.5)
nRBC: 0 % (ref 0.0–0.2)

## 2018-11-29 MED ORDER — SODIUM CHLORIDE 0.9 % IV SOLN
200.0000 mg | Freq: Once | INTRAVENOUS | Status: AC
  Administered 2018-11-29: 200 mg via INTRAVENOUS
  Filled 2018-11-29: qty 20

## 2018-11-29 MED ORDER — SODIUM CHLORIDE 0.9 % IV SOLN
Freq: Once | INTRAVENOUS | Status: AC
  Administered 2018-11-29: 09:00:00 via INTRAVENOUS
  Filled 2018-11-29: qty 250

## 2018-11-29 MED ORDER — DEXAMETHASONE SODIUM PHOSPHATE 10 MG/ML IJ SOLN
10.0000 mg | Freq: Once | INTRAMUSCULAR | Status: AC
  Administered 2018-11-29: 10 mg via INTRAVENOUS

## 2018-11-29 MED ORDER — DEXAMETHASONE SODIUM PHOSPHATE 10 MG/ML IJ SOLN
INTRAMUSCULAR | Status: AC
  Filled 2018-11-29: qty 1

## 2018-11-29 MED ORDER — PALONOSETRON HCL INJECTION 0.25 MG/5ML
0.2500 mg | Freq: Once | INTRAVENOUS | Status: AC
  Administered 2018-11-29: 0.25 mg via INTRAVENOUS

## 2018-11-29 MED ORDER — PALONOSETRON HCL INJECTION 0.25 MG/5ML
INTRAVENOUS | Status: AC
  Filled 2018-11-29: qty 5

## 2018-11-29 NOTE — Patient Instructions (Signed)
Coronavirus (COVID-19) Are you at risk?  Are you at risk for the Coronavirus (COVID-19)?  To be considered HIGH RISK for Coronavirus (COVID-19), you have to meet the following criteria:  . Traveled to [place], [place], [place], [place] or [place]; or in the [place] to [place], [place], [place], or [place]; and have fever, cough, and shortness of breath within the last 2 weeks of travel OR . Been in close contact with a person diagnosed with COVID-19 within the last 2 weeks and have fever, cough, and shortness of breath . IF YOU DO NOT MEET THESE CRITERIA, YOU ARE CONSIDERED LOW RISK FOR COVID-19.  What to do if you are HIGH RISK for COVID-19?  . If you are having a medical emergency, call 911. . Seek medical care right away. Before you go to a doctor's office, urgent care or emergency department, call ahead and tell them about your recent travel, contact with someone diagnosed with COVID-19, and your symptoms. You should receive instructions from your physician's office regarding next steps of care.  . When you arrive at healthcare provider, tell the healthcare staff immediately you have returned from visiting [place], [place], [place], [place] or [place]; or traveled in the [place] to [place], [place], [place], or [place]; in the last two weeks or you have been in close contact with a person diagnosed with COVID-19 in the last 2 weeks.   . Tell the health care staff about your symptoms: fever, cough and shortness of breath. . After you have been seen by a medical provider, you will be either: o Tested for (COVID-19) and discharged home on quarantine except to seek medical care if symptoms worsen, and asked to  - Stay home and avoid contact with others until you get your results (4-5 days)  - Avoid travel on public transportation if possible (such as bus, train, or airplane) or o Sent to the Emergency Department by EMS for evaluation, COVID-19 testing, and possible  admission depending on your condition and test results.  What to do if you are LOW RISK for COVID-19?  Reduce your risk of any infection by using the same precautions used for avoiding the common cold or flu:  . Wash your hands often with soap and warm water for at least 20 seconds.  If soap and water are not readily available, use an alcohol-based hand sanitizer with at least 60% alcohol.  . If coughing or sneezing, cover your mouth and nose by coughing or sneezing into the elbow areas of your shirt or coat, into a tissue or into your sleeve (not your hands). . Avoid shaking hands with others and consider head nods or verbal greetings only. . Avoid touching your eyes, nose, or mouth with unwashed hands.  . Avoid close contact with people who are sick. . Avoid places or events with large numbers of people in one location, like concerts or sporting events. . Carefully consider travel plans you have or are making. . If you are planning any travel outside or inside the US, visit the CDC's Travelers' Health webpage for the latest health notices. . If you have some symptoms but not all symptoms, continue to monitor at home and seek medical attention if your symptoms worsen. . If you are having a medical emergency, call 911.   ADDITIONAL HEALTHCARE OPTIONS FOR PATIENTS  [facility] / e-Visit: [email or web address]         [facility]: [phone]  Onset   Urgent Care: [facility]: [place] Discharge Instructions for Patients Receiving Chemotherapy  Today you received the following chemotherapy agents :  Carboplatin.  To help prevent nausea and vomiting after your treatment, we encourage you to take your nausea medication as prescribed.  TAKE COMPAZINE 10 MG EVERY 6 HOURS AS NEEDED FOR NAUSEA.  MAY CAUSE DROWSINESS.   If you develop nausea  and vomiting that is not controlled by your nausea medication, call the clinic.   BELOW ARE SYMPTOMS THAT SHOULD BE REPORTED IMMEDIATELY:  *FEVER GREATER THAN 100.5 F  *CHILLS WITH OR WITHOUT FEVER  NAUSEA AND VOMITING THAT IS NOT CONTROLLED WITH YOUR NAUSEA MEDICATION  *UNUSUAL SHORTNESS OF BREATH  *UNUSUAL BRUISING OR BLEEDING  TENDERNESS IN MOUTH AND THROAT WITH OR WITHOUT PRESENCE OF ULCERS  *URINARY PROBLEMS  *BOWEL PROBLEMS  UNUSUAL RASH Items with * indicate a potential emergency and should be followed up as soon as possible.  Feel free to call the clinic should you have any questions or concerns. The clinic phone number is [phone].  Please show the [facility] at check-in to the Emergency Department and triage nurse.

## 2018-11-30 ENCOUNTER — Ambulatory Visit
Admission: RE | Admit: 2018-11-30 | Discharge: 2018-11-30 | Disposition: A | Discharge status: Home / Self Care | Payer: Medicare Other | Source: Ambulatory Visit | Attending: Radiation Oncology | Admitting: Radiation Oncology

## 2018-11-30 DIAGNOSIS — Z510 Encounter for antineoplastic radiation therapy: Secondary | ICD-10-CM | POA: Diagnosis not present

## 2018-11-30 DIAGNOSIS — C672 Malignant neoplasm of lateral wall of bladder: Secondary | ICD-10-CM | POA: Diagnosis not present

## 2018-11-30 DIAGNOSIS — C670 Malignant neoplasm of trigone of bladder: Secondary | ICD-10-CM | POA: Diagnosis not present

## 2018-12-01 ENCOUNTER — Ambulatory Visit
Admission: RE | Admit: 2018-12-01 | Discharge: 2018-12-01 | Disposition: A | Discharge status: Home / Self Care | Payer: Medicare Other | Source: Ambulatory Visit | Attending: Radiation Oncology | Admitting: Radiation Oncology

## 2018-12-01 DIAGNOSIS — C672 Malignant neoplasm of lateral wall of bladder: Secondary | ICD-10-CM | POA: Diagnosis not present

## 2018-12-01 DIAGNOSIS — C670 Malignant neoplasm of trigone of bladder: Secondary | ICD-10-CM | POA: Diagnosis not present

## 2018-12-01 DIAGNOSIS — Z510 Encounter for antineoplastic radiation therapy: Secondary | ICD-10-CM | POA: Diagnosis not present

## 2018-12-02 ENCOUNTER — Ambulatory Visit
Admission: RE | Admit: 2018-12-02 | Discharge: 2018-12-02 | Disposition: A | Discharge status: Home / Self Care | Payer: Medicare Other | Source: Ambulatory Visit | Attending: Radiation Oncology | Admitting: Radiation Oncology

## 2018-12-02 DIAGNOSIS — Z510 Encounter for antineoplastic radiation therapy: Secondary | ICD-10-CM | POA: Diagnosis not present

## 2018-12-02 DIAGNOSIS — C670 Malignant neoplasm of trigone of bladder: Secondary | ICD-10-CM | POA: Diagnosis not present

## 2018-12-02 DIAGNOSIS — C672 Malignant neoplasm of lateral wall of bladder: Secondary | ICD-10-CM | POA: Diagnosis not present

## 2018-12-05 ENCOUNTER — Ambulatory Visit
Admission: RE | Admit: 2018-12-05 | Discharge: 2018-12-05 | Disposition: A | Discharge status: Home / Self Care | Payer: Medicare Other | Source: Ambulatory Visit | Attending: Radiation Oncology | Admitting: Radiation Oncology

## 2018-12-05 DIAGNOSIS — C670 Malignant neoplasm of trigone of bladder: Secondary | ICD-10-CM | POA: Diagnosis not present

## 2018-12-05 DIAGNOSIS — Z510 Encounter for antineoplastic radiation therapy: Secondary | ICD-10-CM | POA: Diagnosis not present

## 2018-12-05 DIAGNOSIS — C672 Malignant neoplasm of lateral wall of bladder: Secondary | ICD-10-CM | POA: Diagnosis not present

## 2018-12-06 ENCOUNTER — Ambulatory Visit
Admission: RE | Admit: 2018-12-06 | Discharge: 2018-12-06 | Disposition: A | Discharge status: Home / Self Care | Payer: Medicare Other | Source: Ambulatory Visit | Attending: Radiation Oncology | Admitting: Radiation Oncology

## 2018-12-06 ENCOUNTER — Other Ambulatory Visit: Payer: Self-pay

## 2018-12-06 ENCOUNTER — Inpatient Hospital Stay: Payer: Medicare Other

## 2018-12-06 VITALS — BP 136/74 | HR 97 | Temp 98.3°F | Resp 16

## 2018-12-06 DIAGNOSIS — C679 Malignant neoplasm of bladder, unspecified: Secondary | ICD-10-CM

## 2018-12-06 DIAGNOSIS — Z5111 Encounter for antineoplastic chemotherapy: Secondary | ICD-10-CM | POA: Diagnosis not present

## 2018-12-06 DIAGNOSIS — R300 Dysuria: Secondary | ICD-10-CM | POA: Diagnosis not present

## 2018-12-06 DIAGNOSIS — R634 Abnormal weight loss: Secondary | ICD-10-CM | POA: Diagnosis not present

## 2018-12-06 DIAGNOSIS — Z510 Encounter for antineoplastic radiation therapy: Secondary | ICD-10-CM | POA: Diagnosis not present

## 2018-12-06 DIAGNOSIS — C670 Malignant neoplasm of trigone of bladder: Secondary | ICD-10-CM | POA: Diagnosis not present

## 2018-12-06 DIAGNOSIS — C672 Malignant neoplasm of lateral wall of bladder: Secondary | ICD-10-CM | POA: Diagnosis not present

## 2018-12-06 DIAGNOSIS — F0390 Unspecified dementia without behavioral disturbance: Secondary | ICD-10-CM | POA: Diagnosis not present

## 2018-12-06 DIAGNOSIS — R5383 Other fatigue: Secondary | ICD-10-CM | POA: Diagnosis not present

## 2018-12-06 LAB — CBC WITH DIFFERENTIAL (CANCER CENTER ONLY)
Abs Immature Granulocytes: 0.08 10*3/uL — ABNORMAL HIGH (ref 0.00–0.07)
Basophils Absolute: 0 10*3/uL (ref 0.0–0.1)
Basophils Relative: 1 %
Eosinophils Absolute: 0.2 10*3/uL (ref 0.0–0.5)
Eosinophils Relative: 3 %
HCT: 38.1 % — ABNORMAL LOW (ref 39.0–52.0)
Hemoglobin: 12.8 g/dL — ABNORMAL LOW (ref 13.0–17.0)
Immature Granulocytes: 1 %
Lymphocytes Relative: 5 %
Lymphs Abs: 0.3 10*3/uL — ABNORMAL LOW (ref 0.7–4.0)
MCH: 28.5 pg (ref 26.0–34.0)
MCHC: 33.6 g/dL (ref 30.0–36.0)
MCV: 84.9 fL (ref 80.0–100.0)
Monocytes Absolute: 0.8 10*3/uL (ref 0.1–1.0)
Monocytes Relative: 12 %
Neutro Abs: 5.3 10*3/uL (ref 1.7–7.7)
Neutrophils Relative %: 78 %
Platelet Count: 124 10*3/uL — ABNORMAL LOW (ref 150–400)
RBC: 4.49 MIL/uL (ref 4.22–5.81)
RDW: 13.4 % (ref 11.5–15.5)
WBC Count: 6.8 10*3/uL (ref 4.0–10.5)
nRBC: 0 % (ref 0.0–0.2)

## 2018-12-06 LAB — CMP (CANCER CENTER ONLY)
ALT: 12 U/L (ref 0–44)
AST: 17 U/L (ref 15–41)
Albumin: 3.8 g/dL (ref 3.5–5.0)
Alkaline Phosphatase: 54 U/L (ref 38–126)
Anion gap: 11 (ref 5–15)
BUN: 19 mg/dL (ref 8–23)
CO2: 26 mmol/L (ref 22–32)
Calcium: 9.1 mg/dL (ref 8.9–10.3)
Chloride: 102 mmol/L (ref 98–111)
Creatinine: 0.96 mg/dL (ref 0.61–1.24)
GFR, Est AFR Am: >60 mL/min (ref >60)
GFR, Estimated: >60 mL/min (ref >60)
Glucose, Bld: 203 mg/dL — ABNORMAL HIGH (ref 70–99)
Potassium: 3.7 mmol/L (ref 3.5–5.1)
Sodium: 139 mmol/L (ref 135–145)
Total Bilirubin: 0.9 mg/dL (ref 0.3–1.2)
Total Protein: 6.6 g/dL (ref 6.5–8.1)

## 2018-12-06 MED ORDER — DEXAMETHASONE SODIUM PHOSPHATE 10 MG/ML IJ SOLN
10.0000 mg | Freq: Once | INTRAMUSCULAR | Status: AC
  Administered 2018-12-06: 10 mg via INTRAVENOUS

## 2018-12-06 MED ORDER — PALONOSETRON HCL INJECTION 0.25 MG/5ML
INTRAVENOUS | Status: AC
  Filled 2018-12-06: qty 5

## 2018-12-06 MED ORDER — SODIUM CHLORIDE 0.9 % IV SOLN
Freq: Once | INTRAVENOUS | Status: AC
  Administered 2018-12-06: 10:00:00 via INTRAVENOUS
  Filled 2018-12-06: qty 250

## 2018-12-06 MED ORDER — PALONOSETRON HCL INJECTION 0.25 MG/5ML
0.2500 mg | Freq: Once | INTRAVENOUS | Status: AC
  Administered 2018-12-06: 0.25 mg via INTRAVENOUS

## 2018-12-06 MED ORDER — SODIUM CHLORIDE 0.9 % IV SOLN
200.0000 mg | Freq: Once | INTRAVENOUS | Status: AC
  Administered 2018-12-06: 200 mg via INTRAVENOUS
  Filled 2018-12-06: qty 20

## 2018-12-06 MED ORDER — DEXAMETHASONE SODIUM PHOSPHATE 10 MG/ML IJ SOLN
INTRAMUSCULAR | Status: AC
  Filled 2018-12-06: qty 1

## 2018-12-06 NOTE — Patient Instructions (Signed)
Coronavirus (COVID-19) Are you at risk?  Are you at risk for the Coronavirus (COVID-19)?  To be considered HIGH RISK for Coronavirus (COVID-19), you have to meet the following criteria:  . Traveled to [place], [place], [place], [place] or [place]; or in the [place] to [place], [place], [place], or [place]; and have fever, cough, and shortness of breath within the last 2 weeks of travel OR . Been in close contact with a person diagnosed with COVID-19 within the last 2 weeks and have fever, cough, and shortness of breath . IF YOU DO NOT MEET THESE CRITERIA, YOU ARE CONSIDERED LOW RISK FOR COVID-19.  What to do if you are HIGH RISK for COVID-19?  . If you are having a medical emergency, call 911. . Seek medical care right away. Before you go to a doctor's office, urgent care or emergency department, call ahead and tell them about your recent travel, contact with someone diagnosed with COVID-19, and your symptoms. You should receive instructions from your physician's office regarding next steps of care.  . When you arrive at healthcare provider, tell the healthcare staff immediately you have returned from visiting [place], [place], [place], [place] or [place]; or traveled in the [place] to [place], [place], [place], or [place]; in the last two weeks or you have been in close contact with a person diagnosed with COVID-19 in the last 2 weeks.   . Tell the health care staff about your symptoms: fever, cough and shortness of breath. . After you have been seen by a medical provider, you will be either: o Tested for (COVID-19) and discharged home on quarantine except to seek medical care if symptoms worsen, and asked to  - Stay home and avoid contact with others until you get your results (4-5 days)  - Avoid travel on public transportation if possible (such as bus, train, or airplane) or o Sent to the Emergency Department by EMS for evaluation, COVID-19 testing, and possible  admission depending on your condition and test results.  What to do if you are LOW RISK for COVID-19?  Reduce your risk of any infection by using the same precautions used for avoiding the common cold or flu:  . Wash your hands often with soap and warm water for at least 20 seconds.  If soap and water are not readily available, use an alcohol-based hand sanitizer with at least 60% alcohol.  . If coughing or sneezing, cover your mouth and nose by coughing or sneezing into the elbow areas of your shirt or coat, into a tissue or into your sleeve (not your hands). . Avoid shaking hands with others and consider head nods or verbal greetings only. . Avoid touching your eyes, nose, or mouth with unwashed hands.  . Avoid close contact with people who are sick. . Avoid places or events with large numbers of people in one location, like concerts or sporting events. . Carefully consider travel plans you have or are making. . If you are planning any travel outside or inside the [place], visit the CDC's Travelers' Health webpage for the latest health notices. . If you have some symptoms but not all symptoms, continue to monitor at home and seek medical attention if your symptoms worsen. . If you are having a medical emergency, call 911.   ADDITIONAL HEALTHCARE OPTIONS FOR PATIENTS  [facility] / e-Visit: [email or web address]         [facility]: [phone]  Onset   Urgent Care: [facility]: [place] Discharge Instructions for Patients Receiving Chemotherapy  Today you received the following chemotherapy agents :  Carboplatin.  To help prevent nausea and vomiting after your treatment, we encourage you to take your nausea medication as prescribed.  TAKE COMPAZINE 10 MG EVERY 6 HOURS AS NEEDED FOR NAUSEA.  MAY CAUSE DROWSINESS.   If you develop nausea  and vomiting that is not controlled by your nausea medication, call the clinic.   BELOW ARE SYMPTOMS THAT SHOULD BE REPORTED IMMEDIATELY:  *FEVER GREATER THAN 100.5 F  *CHILLS WITH OR WITHOUT FEVER  NAUSEA AND VOMITING THAT IS NOT CONTROLLED WITH YOUR NAUSEA MEDICATION  *UNUSUAL SHORTNESS OF BREATH  *UNUSUAL BRUISING OR BLEEDING  TENDERNESS IN MOUTH AND THROAT WITH OR WITHOUT PRESENCE OF ULCERS  *URINARY PROBLEMS  *BOWEL PROBLEMS  UNUSUAL RASH Items with * indicate a potential emergency and should be followed up as soon as possible.  Feel free to call the clinic should you have any questions or concerns. The clinic phone number is [phone].  Please show the [facility] at check-in to the Emergency Department and triage nurse.

## 2018-12-07 ENCOUNTER — Ambulatory Visit
Admission: RE | Admit: 2018-12-07 | Discharge: 2018-12-07 | Disposition: A | Discharge status: Home / Self Care | Payer: Medicare Other | Source: Ambulatory Visit | Attending: Radiation Oncology | Admitting: Radiation Oncology

## 2018-12-07 DIAGNOSIS — C670 Malignant neoplasm of trigone of bladder: Secondary | ICD-10-CM | POA: Diagnosis not present

## 2018-12-07 DIAGNOSIS — C672 Malignant neoplasm of lateral wall of bladder: Secondary | ICD-10-CM | POA: Diagnosis not present

## 2018-12-07 DIAGNOSIS — Z510 Encounter for antineoplastic radiation therapy: Secondary | ICD-10-CM | POA: Diagnosis not present

## 2018-12-08 ENCOUNTER — Ambulatory Visit
Admission: RE | Admit: 2018-12-08 | Discharge: 2018-12-08 | Disposition: A | Discharge status: Home / Self Care | Payer: Medicare Other | Source: Ambulatory Visit | Attending: Radiation Oncology | Admitting: Radiation Oncology

## 2018-12-08 DIAGNOSIS — C672 Malignant neoplasm of lateral wall of bladder: Secondary | ICD-10-CM | POA: Diagnosis not present

## 2018-12-08 DIAGNOSIS — Z510 Encounter for antineoplastic radiation therapy: Secondary | ICD-10-CM | POA: Diagnosis not present

## 2018-12-08 DIAGNOSIS — C670 Malignant neoplasm of trigone of bladder: Secondary | ICD-10-CM | POA: Diagnosis not present

## 2018-12-09 ENCOUNTER — Ambulatory Visit
Admission: RE | Admit: 2018-12-09 | Discharge: 2018-12-09 | Disposition: A | Discharge status: Home / Self Care | Payer: Medicare Other | Source: Ambulatory Visit | Attending: Radiation Oncology | Admitting: Radiation Oncology

## 2018-12-09 DIAGNOSIS — Z510 Encounter for antineoplastic radiation therapy: Secondary | ICD-10-CM | POA: Diagnosis not present

## 2018-12-09 DIAGNOSIS — C670 Malignant neoplasm of trigone of bladder: Secondary | ICD-10-CM | POA: Diagnosis not present

## 2018-12-09 DIAGNOSIS — C672 Malignant neoplasm of lateral wall of bladder: Secondary | ICD-10-CM | POA: Diagnosis not present

## 2018-12-12 ENCOUNTER — Ambulatory Visit
Admission: RE | Admit: 2018-12-12 | Discharge: 2018-12-12 | Disposition: A | Discharge status: Home / Self Care | Payer: Medicare Other | Source: Ambulatory Visit | Attending: Radiation Oncology | Admitting: Radiation Oncology

## 2018-12-12 DIAGNOSIS — C670 Malignant neoplasm of trigone of bladder: Secondary | ICD-10-CM | POA: Diagnosis not present

## 2018-12-12 DIAGNOSIS — Z510 Encounter for antineoplastic radiation therapy: Secondary | ICD-10-CM | POA: Diagnosis not present

## 2018-12-12 DIAGNOSIS — C672 Malignant neoplasm of lateral wall of bladder: Secondary | ICD-10-CM | POA: Diagnosis not present

## 2018-12-13 ENCOUNTER — Ambulatory Visit: Payer: Medicare Other

## 2018-12-13 ENCOUNTER — Other Ambulatory Visit: Payer: Self-pay

## 2018-12-13 ENCOUNTER — Other Ambulatory Visit: Payer: Federal, State, Local not specified - PPO

## 2018-12-13 ENCOUNTER — Ambulatory Visit
Admission: RE | Admit: 2018-12-13 | Discharge: 2018-12-13 | Disposition: A | Discharge status: Home / Self Care | Payer: Medicare Other | Source: Ambulatory Visit | Attending: Radiation Oncology | Admitting: Radiation Oncology

## 2018-12-13 ENCOUNTER — Inpatient Hospital Stay: Payer: Medicare Other

## 2018-12-13 ENCOUNTER — Inpatient Hospital Stay (HOSPITAL_BASED_OUTPATIENT_CLINIC_OR_DEPARTMENT_OTHER): Payer: Medicare Other | Admitting: Oncology

## 2018-12-13 VITALS — BP 111/63 | HR 104 | Temp 97.7°F | Resp 18 | Ht 70.0 in | Wt 181.1 lb

## 2018-12-13 VITALS — HR 96

## 2018-12-13 DIAGNOSIS — R630 Anorexia: Secondary | ICD-10-CM | POA: Diagnosis not present

## 2018-12-13 DIAGNOSIS — C672 Malignant neoplasm of lateral wall of bladder: Secondary | ICD-10-CM

## 2018-12-13 DIAGNOSIS — R350 Frequency of micturition: Secondary | ICD-10-CM

## 2018-12-13 DIAGNOSIS — C679 Malignant neoplasm of bladder, unspecified: Secondary | ICD-10-CM

## 2018-12-13 DIAGNOSIS — R5383 Other fatigue: Secondary | ICD-10-CM

## 2018-12-13 DIAGNOSIS — F0390 Unspecified dementia without behavioral disturbance: Secondary | ICD-10-CM

## 2018-12-13 DIAGNOSIS — R634 Abnormal weight loss: Secondary | ICD-10-CM

## 2018-12-13 DIAGNOSIS — C670 Malignant neoplasm of trigone of bladder: Secondary | ICD-10-CM | POA: Diagnosis not present

## 2018-12-13 DIAGNOSIS — Z510 Encounter for antineoplastic radiation therapy: Secondary | ICD-10-CM | POA: Diagnosis not present

## 2018-12-13 DIAGNOSIS — R300 Dysuria: Secondary | ICD-10-CM | POA: Diagnosis not present

## 2018-12-13 DIAGNOSIS — Z5111 Encounter for antineoplastic chemotherapy: Secondary | ICD-10-CM | POA: Diagnosis not present

## 2018-12-13 LAB — CBC WITH DIFFERENTIAL (CANCER CENTER ONLY)
Abs Immature Granulocytes: 0.05 10*3/uL (ref 0.00–0.07)
Basophils Absolute: 0 10*3/uL (ref 0.0–0.1)
Basophils Relative: 1 %
Eosinophils Absolute: 0.1 10*3/uL (ref 0.0–0.5)
Eosinophils Relative: 2 %
HCT: 35.5 % — ABNORMAL LOW (ref 39.0–52.0)
Hemoglobin: 12.2 g/dL — ABNORMAL LOW (ref 13.0–17.0)
Immature Granulocytes: 1 %
Lymphocytes Relative: 4 %
Lymphs Abs: 0.3 10*3/uL — ABNORMAL LOW (ref 0.7–4.0)
MCH: 28.2 pg (ref 26.0–34.0)
MCHC: 34.4 g/dL (ref 30.0–36.0)
MCV: 82.2 fL (ref 80.0–100.0)
Monocytes Absolute: 0.9 10*3/uL (ref 0.1–1.0)
Monocytes Relative: 13 %
Neutro Abs: 5.2 10*3/uL (ref 1.7–7.7)
Neutrophils Relative %: 79 %
Platelet Count: 162 10*3/uL (ref 150–400)
RBC: 4.32 MIL/uL (ref 4.22–5.81)
RDW: 14 % (ref 11.5–15.5)
WBC Count: 6.6 10*3/uL (ref 4.0–10.5)
nRBC: 0 % (ref 0.0–0.2)

## 2018-12-13 LAB — CMP (CANCER CENTER ONLY)
ALT: 13 U/L (ref 0–44)
AST: 16 U/L (ref 15–41)
Albumin: 3.8 g/dL (ref 3.5–5.0)
Alkaline Phosphatase: 60 U/L (ref 38–126)
Anion gap: 13 (ref 5–15)
BUN: 16 mg/dL (ref 8–23)
CO2: 26 mmol/L (ref 22–32)
Calcium: 9.2 mg/dL (ref 8.9–10.3)
Chloride: 99 mmol/L (ref 98–111)
Creatinine: 1.09 mg/dL (ref 0.61–1.24)
GFR, Est AFR Am: >60 mL/min (ref >60)
GFR, Estimated: >60 mL/min (ref >60)
Glucose, Bld: 288 mg/dL — ABNORMAL HIGH (ref 70–99)
Potassium: 3.5 mmol/L (ref 3.5–5.1)
Sodium: 138 mmol/L (ref 135–145)
Total Bilirubin: 1.4 mg/dL — ABNORMAL HIGH (ref 0.3–1.2)
Total Protein: 6.7 g/dL (ref 6.5–8.1)

## 2018-12-13 MED ORDER — SODIUM CHLORIDE 0.9 % IV SOLN
Freq: Once | INTRAVENOUS | Status: AC
  Administered 2018-12-13: 11:00:00 via INTRAVENOUS
  Filled 2018-12-13: qty 250

## 2018-12-13 MED ORDER — PALONOSETRON HCL INJECTION 0.25 MG/5ML
INTRAVENOUS | Status: AC
  Filled 2018-12-13: qty 5

## 2018-12-13 MED ORDER — SODIUM CHLORIDE 0.9 % IV SOLN
200.0000 mg | Freq: Once | INTRAVENOUS | Status: AC
  Administered 2018-12-13: 200 mg via INTRAVENOUS
  Filled 2018-12-13: qty 20

## 2018-12-13 MED ORDER — PALONOSETRON HCL INJECTION 0.25 MG/5ML
0.2500 mg | Freq: Once | INTRAVENOUS | Status: AC
  Administered 2018-12-13: 0.25 mg via INTRAVENOUS

## 2018-12-13 MED ORDER — DEXAMETHASONE SODIUM PHOSPHATE 10 MG/ML IJ SOLN
10.0000 mg | Freq: Once | INTRAMUSCULAR | Status: AC
  Administered 2018-12-13: 10 mg via INTRAVENOUS

## 2018-12-13 MED ORDER — DEXAMETHASONE SODIUM PHOSPHATE 10 MG/ML IJ SOLN
INTRAMUSCULAR | Status: AC
  Filled 2018-12-13: qty 1

## 2018-12-13 NOTE — Progress Notes (Signed)
Hematology and Oncology Follow Up Visit  [name] [name] [id number] March 22, 1941 78 y.o. 12/13/2018 10:29 AM [name] [name], MDRamachandran, [name], MD   Principle Diagnosis: T2N0 high-grade urothelial carcinoma of the bladder diagnosed in March 2020.    Prior Therapy:   He is status post cystoscopy and repeat TURBT on May 4 of 2020 under the care of Dr. Alinda Money.  Which showed no muscle invasion.  He is status post TURBT on August 29, 2018 which showed muscle invasive high-grade urothelial carcinoma.  Current therapy: He is receiving definitive therapy with radiation and weekly carboplatin with cycle 1 to be given on 11/15/2018.  He is here for cycle 4 of therapy.  Interim History: [name] returns today for a repeat evaluation.  Since the last visit, since her last visit, he reports no major changes in his health.  He does report some occasional frequency and dysuria. He also reported complaints of fatigue and tiredness as well as anorexia.  He has lost some weight although his wife is giving him nutritional supplements in attempt to curb that.  Denies any recent falls or syncope.  Denies any pathological fractures.   Patient denied any alteration mental status, neuropathy, confusion or dizziness.  Denies any headaches or lethargy.  Denies any night sweats, weight loss or changes in appetite.  Denied orthopnea, dyspnea on exertion or chest discomfort.  Denies shortness of breath, difficulty breathing hemoptysis or cough.  Denies any abdominal distention, nausea, early satiety or dyspepsia.  Denies any hematuria, frequency, dysuria or nocturia.  Denies any skin irritation, dryness or rash.  Denies any ecchymosis or petechiae.  Denies any lymphadenopathy or clotting.  Denies any heat or cold intolerance.  Denies any anxiety or depression.  Remaining review of system is negative.       Medications: I have reviewed the patient's current medications.  Current Outpatient Medications   Medication Sig Dispense Refill  . dutasteride (AVODART) 0.5 MG capsule Take 0.5 mg by mouth daily.     [name] glipiZIDE (GLUCOTROL) 5 MG tablet Take 2.5 mg by mouth daily before breakfast.     . hydrochlorothiazide (HYDRODIURIL) 25 MG tablet Take 25 mg by mouth daily.     . memantine (NAMENDA XR) 28 MG CP24 24 hr capsule Take 28 mg by mouth every morning.     . metFORMIN (GLUCOPHAGE) 500 MG tablet Take 1,000 mg by mouth 2 (two) times daily with a meal.    . metoprolol succinate (TOPROL-XL) 25 MG 24 hr tablet Take 25 mg by mouth daily.     . Multiple Vitamin (MULTIVITAMIN WITH MINERALS) TABS tablet Take 1 tablet by mouth every evening.    [name] MYRBETRIQ 50 MG TB24 tablet Take 50 mg by mouth daily.    . Omega-3 Fatty Acids (FISH OIL) 1200 MG CAPS Take 1,200 mg by mouth every evening.    [name] DELICA LANCETS 30Q MISC 2 (two) times daily. as directed  4  . perindopril (ACEON) 4 MG tablet Take 4 mg by mouth daily.     . prochlorperazine (COMPAZINE) 10 MG tablet Take 1 tablet (10 mg total) by mouth every 6 (six) hours as needed for nausea or vomiting. 30 tablet 0  . saxagliptin HCl (ONGLYZA) 5 MG TABS tablet Take 5 mg by mouth daily.    . trospium (SANCTURA) 20 MG tablet Take 20 mg by mouth 2 (two) times daily.    . vitamin B-12 (CYANOCOBALAMIN) 500 MCG tablet Take 500 mcg by mouth every evening.  No current facility-administered medications for this visit.      Allergies:  Allergies  Allergen Reactions  . Sulfa Antibiotics Hives  . Tetracyclines & Related Rash  . Aricept [Donepezil Hcl] Nausea And Vomiting    Past Medical History, Surgical history, Social history, and Family History were reviewed and updated.    Physical Exam: Blood pressure 111/63, pulse (!) 104, temperature 97.7 F (36.5 C), temperature source Oral, resp. rate 18, height 5\' 10"  (1.778 m), weight 181 lb 1.6 oz (82.1 kg), SpO2 100 %.   ECOG: 1     General appearance: Comfortable appearing without any  discomfort Head: Normocephalic without any trauma Oropharynx: Mucous membranes are moist and pink without any thrush or ulcers. Eyes: Pupils are equal and round reactive to light. Lymph nodes: No cervical, supraclavicular, inguinal or axillary lymphadenopathy.   Heart:regular rate and rhythm.  S1 and S2 without leg edema. Lung: Clear without any rhonchi or wheezes.  No dullness to percussion. Abdomin: Soft, nontender, nondistended with good bowel sounds.  No hepatosplenomegaly. Musculoskeletal: No joint deformity or effusion.  Full range of motion noted. Neurological: No deficits noted on motor, sensory and deep tendon reflex exam. Skin: No petechial rash or dryness.  Appeared moist.      Lab Results: Lab Results  Component Value Date   WBC 6.6 12/13/2018   HGB 12.2 (L) 12/13/2018   HCT 35.5 (L) 12/13/2018   MCV 82.2 12/13/2018   PLT 162 12/13/2018     Chemistry      Component Value Date/Time   NA 138 12/13/2018 0925   K 3.5 12/13/2018 0925   CL 99 12/13/2018 0925   CO2 26 12/13/2018 0925   BUN 16 12/13/2018 0925   CREATININE 1.09 12/13/2018 0925      Component Value Date/Time   CALCIUM 9.2 12/13/2018 0925   ALKPHOS 60 12/13/2018 0925   AST 16 12/13/2018 0925   ALT 13 12/13/2018 0925   BILITOT 1.4 (H) 12/13/2018 0925         Impression and Plan:  78 year old with the following:  1.    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    He is currently receiving definitive therapy with radiation as well as weekly carboplatin.  Risks and benefits of continuing this therapy at this time was reviewed.  Potential complications including dysuria, frequency and urgency.  He is agreeable to continue and will proceed with total of 6 cycles.  2.  IV access: Chemotherapy administered via peripheral veins without any issues at this time.  3.  Antiemetics: No nausea or vomiting reported.  Compazine is available to him.  4.  Dementia: No  worsening decline in his mental state at this time.  His wife is accompanying him at this time without any issues.  5.  Renal function surveillance: Kidney function remains adequate time.  No adjustment of chemotherapy needed.  6.  Anorexia: We have discussed strategies to boost his appetite including improving nutritional supplements and consideration for Megace.  7.  Follow-up: We will continue to follow-up weekly to complete 6 cycles of therapy.   25  minutes was spent with the patient face-to-face today.  More than 50% of time was spent on reviewing his disease status, treatment options, complications related therapy to plan of care.    Zola Button, MD 6/30/202010:29 AM

## 2018-12-13 NOTE — Patient Instructions (Signed)
   [facility] Discharge Instructions for Patients Receiving Chemotherapy  Today you received the following chemotherapy agents :  Carboplatin.  To help prevent nausea and vomiting after your treatment, we encourage you to take your nausea medication as prescribed.  TAKE COMPAZINE 10 MG EVERY 6 HOURS AS NEEDED FOR NAUSEA.  MAY CAUSE DROWSINESS.   If you develop nausea and vomiting that is not controlled by your nausea medication, call the clinic.   BELOW ARE SYMPTOMS THAT SHOULD BE REPORTED IMMEDIATELY:  *FEVER GREATER THAN 100.5 F  *CHILLS WITH OR WITHOUT FEVER  NAUSEA AND VOMITING THAT IS NOT CONTROLLED WITH YOUR NAUSEA MEDICATION  *UNUSUAL SHORTNESS OF BREATH  *UNUSUAL BRUISING OR BLEEDING  TENDERNESS IN MOUTH AND THROAT WITH OR WITHOUT PRESENCE OF ULCERS  *URINARY PROBLEMS  *BOWEL PROBLEMS  UNUSUAL RASH Items with * indicate a potential emergency and should be followed up as soon as possible.  Feel free to call the clinic should you have any questions or concerns. The clinic phone number is [phone].  Please show the [facility] at check-in to the Emergency Department and triage nurse.

## 2018-12-14 ENCOUNTER — Ambulatory Visit: Payer: Medicare Other

## 2018-12-14 ENCOUNTER — Ambulatory Visit
Admission: RE | Admit: 2018-12-14 | Discharge: 2018-12-14 | Disposition: A | Discharge status: Home / Self Care | Payer: Medicare Other | Source: Ambulatory Visit | Attending: Radiation Oncology | Admitting: Radiation Oncology

## 2018-12-14 DIAGNOSIS — C678 Malignant neoplasm of overlapping sites of bladder: Secondary | ICD-10-CM | POA: Diagnosis not present

## 2018-12-14 DIAGNOSIS — R8271 Bacteriuria: Secondary | ICD-10-CM | POA: Diagnosis not present

## 2018-12-14 DIAGNOSIS — Z510 Encounter for antineoplastic radiation therapy: Secondary | ICD-10-CM | POA: Insufficient documentation

## 2018-12-14 DIAGNOSIS — C672 Malignant neoplasm of lateral wall of bladder: Secondary | ICD-10-CM | POA: Insufficient documentation

## 2018-12-14 DIAGNOSIS — C670 Malignant neoplasm of trigone of bladder: Secondary | ICD-10-CM | POA: Diagnosis not present

## 2018-12-15 ENCOUNTER — Ambulatory Visit
Admission: RE | Admit: 2018-12-15 | Discharge: 2018-12-15 | Disposition: A | Discharge status: Home / Self Care | Payer: Medicare Other | Source: Ambulatory Visit | Attending: Radiation Oncology | Admitting: Radiation Oncology

## 2018-12-15 ENCOUNTER — Ambulatory Visit: Payer: Medicare Other

## 2018-12-15 DIAGNOSIS — C672 Malignant neoplasm of lateral wall of bladder: Secondary | ICD-10-CM | POA: Diagnosis not present

## 2018-12-15 DIAGNOSIS — C670 Malignant neoplasm of trigone of bladder: Secondary | ICD-10-CM | POA: Diagnosis not present

## 2018-12-15 DIAGNOSIS — Z510 Encounter for antineoplastic radiation therapy: Secondary | ICD-10-CM | POA: Diagnosis not present

## 2018-12-19 ENCOUNTER — Ambulatory Visit
Admission: RE | Admit: 2018-12-19 | Discharge: 2018-12-19 | Disposition: A | Discharge status: Home / Self Care | Payer: Medicare Other | Source: Ambulatory Visit | Attending: Radiation Oncology | Admitting: Radiation Oncology

## 2018-12-19 DIAGNOSIS — C672 Malignant neoplasm of lateral wall of bladder: Secondary | ICD-10-CM | POA: Diagnosis not present

## 2018-12-19 DIAGNOSIS — C670 Malignant neoplasm of trigone of bladder: Secondary | ICD-10-CM | POA: Diagnosis not present

## 2018-12-19 DIAGNOSIS — Z510 Encounter for antineoplastic radiation therapy: Secondary | ICD-10-CM | POA: Diagnosis not present

## 2018-12-20 ENCOUNTER — Other Ambulatory Visit: Payer: Self-pay

## 2018-12-20 ENCOUNTER — Inpatient Hospital Stay: Payer: Medicare Other

## 2018-12-20 ENCOUNTER — Ambulatory Visit
Admission: RE | Admit: 2018-12-20 | Discharge: 2018-12-20 | Disposition: A | Discharge status: Home / Self Care | Payer: Medicare Other | Source: Ambulatory Visit | Attending: Radiation Oncology | Admitting: Radiation Oncology

## 2018-12-20 ENCOUNTER — Telehealth: Payer: Self-pay | Admitting: Radiation Oncology

## 2018-12-20 VITALS — BP 98/70 | HR 107 | Resp 18

## 2018-12-20 DIAGNOSIS — C670 Malignant neoplasm of trigone of bladder: Secondary | ICD-10-CM | POA: Diagnosis not present

## 2018-12-20 DIAGNOSIS — C672 Malignant neoplasm of lateral wall of bladder: Secondary | ICD-10-CM | POA: Diagnosis not present

## 2018-12-20 DIAGNOSIS — Z5111 Encounter for antineoplastic chemotherapy: Secondary | ICD-10-CM | POA: Insufficient documentation

## 2018-12-20 DIAGNOSIS — C679 Malignant neoplasm of bladder, unspecified: Secondary | ICD-10-CM | POA: Insufficient documentation

## 2018-12-20 DIAGNOSIS — Z510 Encounter for antineoplastic radiation therapy: Secondary | ICD-10-CM | POA: Diagnosis not present

## 2018-12-20 LAB — CMP (CANCER CENTER ONLY)
ALT: 13 U/L (ref 0–44)
AST: 17 U/L (ref 15–41)
Albumin: 3.7 g/dL (ref 3.5–5.0)
Alkaline Phosphatase: 63 U/L (ref 38–126)
Anion gap: 15 (ref 5–15)
BUN: 29 mg/dL — ABNORMAL HIGH (ref 8–23)
CO2: 26 mmol/L (ref 22–32)
Calcium: 9.3 mg/dL (ref 8.9–10.3)
Chloride: 98 mmol/L (ref 98–111)
Creatinine: 1.15 mg/dL (ref 0.61–1.24)
GFR, Est AFR Am: >60 mL/min (ref >60)
GFR, Estimated: >60 mL/min (ref >60)
Glucose, Bld: 197 mg/dL — ABNORMAL HIGH (ref 70–99)
Potassium: 3.4 mmol/L — ABNORMAL LOW (ref 3.5–5.1)
Sodium: 139 mmol/L (ref 135–145)
Total Bilirubin: 1 mg/dL (ref 0.3–1.2)
Total Protein: 6.8 g/dL (ref 6.5–8.1)

## 2018-12-20 LAB — CBC WITH DIFFERENTIAL (CANCER CENTER ONLY)
Abs Immature Granulocytes: 0.03 10*3/uL (ref 0.00–0.07)
Basophils Absolute: 0 10*3/uL (ref 0.0–0.1)
Basophils Relative: 1 %
Eosinophils Absolute: 0.1 10*3/uL (ref 0.0–0.5)
Eosinophils Relative: 1 %
HCT: 34 % — ABNORMAL LOW (ref 39.0–52.0)
Hemoglobin: 11.8 g/dL — ABNORMAL LOW (ref 13.0–17.0)
Immature Granulocytes: 1 %
Lymphocytes Relative: 7 %
Lymphs Abs: 0.3 10*3/uL — ABNORMAL LOW (ref 0.7–4.0)
MCH: 28.9 pg (ref 26.0–34.0)
MCHC: 34.7 g/dL (ref 30.0–36.0)
MCV: 83.3 fL (ref 80.0–100.0)
Monocytes Absolute: 0.8 10*3/uL (ref 0.1–1.0)
Monocytes Relative: 19 %
Neutro Abs: 3.1 10*3/uL (ref 1.7–7.7)
Neutrophils Relative %: 71 %
Platelet Count: 174 10*3/uL (ref 150–400)
RBC: 4.08 MIL/uL — ABNORMAL LOW (ref 4.22–5.81)
RDW: 15 % (ref 11.5–15.5)
WBC Count: 4.3 10*3/uL (ref 4.0–10.5)
nRBC: 0 % (ref 0.0–0.2)

## 2018-12-20 MED ORDER — DEXAMETHASONE SODIUM PHOSPHATE 10 MG/ML IJ SOLN
10.0000 mg | Freq: Once | INTRAMUSCULAR | Status: AC
  Administered 2018-12-20: 10 mg via INTRAVENOUS

## 2018-12-20 MED ORDER — SODIUM CHLORIDE 0.9 % IV SOLN
Freq: Once | INTRAVENOUS | Status: AC
  Administered 2018-12-20: 15:00:00 via INTRAVENOUS
  Filled 2018-12-20: qty 250

## 2018-12-20 MED ORDER — PALONOSETRON HCL INJECTION 0.25 MG/5ML
INTRAVENOUS | Status: AC
  Filled 2018-12-20: qty 5

## 2018-12-20 MED ORDER — SODIUM CHLORIDE 0.9 % IV SOLN
200.0000 mg | Freq: Once | INTRAVENOUS | Status: AC
  Administered 2018-12-20: 200 mg via INTRAVENOUS
  Filled 2018-12-20: qty 20

## 2018-12-20 MED ORDER — PALONOSETRON HCL INJECTION 0.25 MG/5ML
0.2500 mg | Freq: Once | INTRAVENOUS | Status: AC
  Administered 2018-12-20: 0.25 mg via INTRAVENOUS

## 2018-12-20 MED ORDER — DEXAMETHASONE SODIUM PHOSPHATE 10 MG/ML IJ SOLN
INTRAMUSCULAR | Status: AC
  Filled 2018-12-20: qty 1

## 2018-12-20 NOTE — Progress Notes (Signed)
Per Dr. Julien Nordmann, okay for patient to receive treatment today with pulse 107.

## 2018-12-20 NOTE — Progress Notes (Signed)
Nutrition Assessment:  78 year old male with bladder cancer receiving chemotherapy and radiation therapy.  Past medical history of DM, memory loss.    Spoke with wife, [name] in infusion this pm.  Wife reports no appetite for the past week and half.  Reports that patient complains about metallic taste, has some diarrhea (controlled with imodium).  Wife unable to tell how many diarrhea stool patient is having as goes to bathroom to urinate frequently.  Wife reports yesterday patient was able to eat about 1/4 bowl of grits, coffee, carnation breakfast essentials gatorade in the am.  For lunch was about to eat 1/2 [employer] cheeseburger and 2-3 french fries.  Last night for dinner ate few nuts and drank gatorade.  Wife reports that patient does drink ensure/boost shakes (thinks it is the blood glucose control).      Medications: glipizide, metformin, MVI, compazine, vit B 12  Labs: glucose 197, BUN 29, creatinine 1.15  Anthropometrics:   Height: 70 inches Weight: 181 lb  UBW: 189 noted on 4/17 BMI: 25   Estimated Energy Needs  Kcals: 2460-2800  calories Protein: 123-140 g Fluid: > 2.4 L  NUTRITION DIAGNOSIS: Inadequate oral intake related to cancer related treatment side effects and memory loss as evidenced by 4% weight loss in the last 3 months and poor appetite   INTERVENTION:  Recommend liberalizing diet to regular at this time vs diabetic diet due to poor appetite and weight loss.   Discussed higher calorie shakes (350 calories or more) at this time with wife and samples provided.  We discussed ways to make them as shakes/smoothies.   Discussed ways to provide additional calories and protein.  Handout provided Discussed strategies to help with taste change.  Contact information provided    MONITORING, EVALUATION, GOAL: Patient will consume adequate calories and protein to maintain weight   NEXT VISIT: July 14  Cairo Lingenfelter B. Zenia Resides, [place], [place] Registered Dietitian [phone]  (pager)

## 2018-12-20 NOTE — Telephone Encounter (Signed)
Phoned [name], patient's wife, to follow up on conversation yesterday. No answer. Left message that Joli, dietician, plans to meet with her and her husband today during his chemotherapy.

## 2018-12-20 NOTE — Telephone Encounter (Signed)
-----   Message from Karie Mainland, RD sent at 12/20/2018  8:35 AM EDT ----- Regarding: RE: Declining weight and poor appetite Hi [name] [name], [name] [name] is on site today and will run up to infusion and talk to his wife. Thanks, Economist ----- Message ----- From: Heywood Footman, RN Sent: 12/19/2018   3:25 PM EDT To: Karie Mainland, RD, # Subject: Declining weight and poor appetite             All.  This patient has bladder ca. He is actively receiving chemotherapy and radiation. The patient's wife has expressed concern about her husband's increasingly poor appetite. She states, "he didn't eat anything this weekend just drank a boost." I have noted a slow decline in his weight (see below). His dementia complicates this situation even more. He has a steady gait. His wife denies that he has complained of dizziness upon standing or any such symptoms indicating he is orthostatic. He has chemotherapy scheduled tomorrow at 2. Thoughts?  Sam, RN   12/19/2018      176  lb 12/15/2018      181.4 lb 12/09/2018    184 lb 12/02/2018    186 lb

## 2018-12-20 NOTE — Patient Instructions (Signed)
   [facility] Discharge Instructions for Patients Receiving Chemotherapy  Today you received the following chemotherapy agents :  Carboplatin.  To help prevent nausea and vomiting after your treatment, we encourage you to take your nausea medication as prescribed.  TAKE COMPAZINE 10 MG EVERY 6 HOURS AS NEEDED FOR NAUSEA.  MAY CAUSE DROWSINESS.   If you develop nausea and vomiting that is not controlled by your nausea medication, call the clinic.   BELOW ARE SYMPTOMS THAT SHOULD BE REPORTED IMMEDIATELY:  *FEVER GREATER THAN 100.5 F  *CHILLS WITH OR WITHOUT FEVER  NAUSEA AND VOMITING THAT IS NOT CONTROLLED WITH YOUR NAUSEA MEDICATION  *UNUSUAL SHORTNESS OF BREATH  *UNUSUAL BRUISING OR BLEEDING  TENDERNESS IN MOUTH AND THROAT WITH OR WITHOUT PRESENCE OF ULCERS  *URINARY PROBLEMS  *BOWEL PROBLEMS  UNUSUAL RASH Items with * indicate a potential emergency and should be followed up as soon as possible.  Feel free to call the clinic should you have any questions or concerns. The clinic phone number is [phone].  Please show the [facility] at check-in to the Emergency Department and triage nurse.

## 2018-12-21 ENCOUNTER — Ambulatory Visit
Admission: RE | Admit: 2018-12-21 | Discharge: 2018-12-21 | Disposition: A | Discharge status: Home / Self Care | Payer: Medicare Other | Source: Ambulatory Visit | Attending: Radiation Oncology | Admitting: Radiation Oncology

## 2018-12-21 DIAGNOSIS — C670 Malignant neoplasm of trigone of bladder: Secondary | ICD-10-CM | POA: Diagnosis not present

## 2018-12-21 DIAGNOSIS — Z510 Encounter for antineoplastic radiation therapy: Secondary | ICD-10-CM | POA: Diagnosis not present

## 2018-12-21 DIAGNOSIS — C672 Malignant neoplasm of lateral wall of bladder: Secondary | ICD-10-CM | POA: Diagnosis not present

## 2018-12-22 ENCOUNTER — Ambulatory Visit
Admission: RE | Admit: 2018-12-22 | Discharge: 2018-12-22 | Disposition: A | Discharge status: Home / Self Care | Payer: Medicare Other | Source: Ambulatory Visit | Attending: Radiation Oncology | Admitting: Radiation Oncology

## 2018-12-22 DIAGNOSIS — C670 Malignant neoplasm of trigone of bladder: Secondary | ICD-10-CM | POA: Diagnosis not present

## 2018-12-22 DIAGNOSIS — Z510 Encounter for antineoplastic radiation therapy: Secondary | ICD-10-CM | POA: Diagnosis not present

## 2018-12-22 DIAGNOSIS — C672 Malignant neoplasm of lateral wall of bladder: Secondary | ICD-10-CM | POA: Diagnosis not present

## 2018-12-23 ENCOUNTER — Ambulatory Visit
Admission: RE | Admit: 2018-12-23 | Discharge: 2018-12-23 | Disposition: A | Discharge status: Home / Self Care | Payer: Medicare Other | Source: Ambulatory Visit | Attending: Radiation Oncology | Admitting: Radiation Oncology

## 2018-12-23 DIAGNOSIS — C672 Malignant neoplasm of lateral wall of bladder: Secondary | ICD-10-CM | POA: Diagnosis not present

## 2018-12-23 DIAGNOSIS — C670 Malignant neoplasm of trigone of bladder: Secondary | ICD-10-CM | POA: Diagnosis not present

## 2018-12-23 DIAGNOSIS — Z510 Encounter for antineoplastic radiation therapy: Secondary | ICD-10-CM | POA: Diagnosis not present

## 2018-12-26 ENCOUNTER — Ambulatory Visit
Admission: RE | Admit: 2018-12-26 | Discharge: 2018-12-26 | Disposition: A | Discharge status: Home / Self Care | Payer: Medicare Other | Source: Ambulatory Visit | Attending: Radiation Oncology | Admitting: Radiation Oncology

## 2018-12-26 DIAGNOSIS — C670 Malignant neoplasm of trigone of bladder: Secondary | ICD-10-CM | POA: Diagnosis not present

## 2018-12-26 DIAGNOSIS — Z510 Encounter for antineoplastic radiation therapy: Secondary | ICD-10-CM | POA: Diagnosis not present

## 2018-12-26 DIAGNOSIS — C672 Malignant neoplasm of lateral wall of bladder: Secondary | ICD-10-CM | POA: Diagnosis not present

## 2018-12-27 ENCOUNTER — Inpatient Hospital Stay: Payer: Medicare Other

## 2018-12-27 ENCOUNTER — Other Ambulatory Visit: Payer: Self-pay

## 2018-12-27 ENCOUNTER — Ambulatory Visit
Admission: RE | Admit: 2018-12-27 | Discharge: 2018-12-27 | Disposition: A | Discharge status: Home / Self Care | Payer: Medicare Other | Source: Ambulatory Visit | Attending: Radiation Oncology | Admitting: Radiation Oncology

## 2018-12-27 VITALS — BP 104/64 | HR 95 | Temp 97.8°F | Resp 16

## 2018-12-27 DIAGNOSIS — C670 Malignant neoplasm of trigone of bladder: Secondary | ICD-10-CM | POA: Diagnosis not present

## 2018-12-27 DIAGNOSIS — Z510 Encounter for antineoplastic radiation therapy: Secondary | ICD-10-CM | POA: Diagnosis not present

## 2018-12-27 DIAGNOSIS — C672 Malignant neoplasm of lateral wall of bladder: Secondary | ICD-10-CM | POA: Diagnosis not present

## 2018-12-27 DIAGNOSIS — C679 Malignant neoplasm of bladder, unspecified: Secondary | ICD-10-CM

## 2018-12-27 LAB — CMP (CANCER CENTER ONLY)
ALT: 17 U/L (ref 0–44)
AST: 18 U/L (ref 15–41)
Albumin: 3.6 g/dL (ref 3.5–5.0)
Alkaline Phosphatase: 56 U/L (ref 38–126)
Anion gap: 14 (ref 5–15)
BUN: 39 mg/dL — ABNORMAL HIGH (ref 8–23)
CO2: 27 mmol/L (ref 22–32)
Calcium: 9.1 mg/dL (ref 8.9–10.3)
Chloride: 97 mmol/L — ABNORMAL LOW (ref 98–111)
Creatinine: 1.2 mg/dL (ref 0.61–1.24)
GFR, Est AFR Am: >60 mL/min (ref >60)
GFR, Estimated: 59 mL/min — ABNORMAL LOW (ref >60)
Glucose, Bld: 295 mg/dL — ABNORMAL HIGH (ref 70–99)
Potassium: 3.5 mmol/L (ref 3.5–5.1)
Sodium: 138 mmol/L (ref 135–145)
Total Bilirubin: 1.4 mg/dL — ABNORMAL HIGH (ref 0.3–1.2)
Total Protein: 6.3 g/dL — ABNORMAL LOW (ref 6.5–8.1)

## 2018-12-27 LAB — CBC WITH DIFFERENTIAL (CANCER CENTER ONLY)
Abs Immature Granulocytes: 0.02 10*3/uL (ref 0.00–0.07)
Basophils Absolute: 0 10*3/uL (ref 0.0–0.1)
Basophils Relative: 0 %
Eosinophils Absolute: 0 10*3/uL (ref 0.0–0.5)
Eosinophils Relative: 1 %
HCT: 29.1 % — ABNORMAL LOW (ref 39.0–52.0)
Hemoglobin: 10.3 g/dL — ABNORMAL LOW (ref 13.0–17.0)
Immature Granulocytes: 1 %
Lymphocytes Relative: 7 %
Lymphs Abs: 0.2 10*3/uL — ABNORMAL LOW (ref 0.7–4.0)
MCH: 29.3 pg (ref 26.0–34.0)
MCHC: 35.4 g/dL (ref 30.0–36.0)
MCV: 82.9 fL (ref 80.0–100.0)
Monocytes Absolute: 0.5 10*3/uL (ref 0.1–1.0)
Monocytes Relative: 13 %
Neutro Abs: 2.9 10*3/uL (ref 1.7–7.7)
Neutrophils Relative %: 78 %
Platelet Count: 103 10*3/uL — ABNORMAL LOW (ref 150–400)
RBC: 3.51 MIL/uL — ABNORMAL LOW (ref 4.22–5.81)
RDW: 15.9 % — ABNORMAL HIGH (ref 11.5–15.5)
WBC Count: 3.6 10*3/uL — ABNORMAL LOW (ref 4.0–10.5)
nRBC: 0 % (ref 0.0–0.2)

## 2018-12-27 MED ORDER — SODIUM CHLORIDE 0.9 % IV SOLN
Freq: Once | INTRAVENOUS | Status: AC
  Administered 2018-12-27: 14:00:00 via INTRAVENOUS
  Filled 2018-12-27: qty 250

## 2018-12-27 MED ORDER — PALONOSETRON HCL INJECTION 0.25 MG/5ML
0.2500 mg | Freq: Once | INTRAVENOUS | Status: AC
  Administered 2018-12-27: 14:00:00 0.25 mg via INTRAVENOUS

## 2018-12-27 MED ORDER — SODIUM CHLORIDE 0.9 % IV SOLN
200.0000 mg | Freq: Once | INTRAVENOUS | Status: AC
  Administered 2018-12-27: 200 mg via INTRAVENOUS
  Filled 2018-12-27: qty 20

## 2018-12-27 MED ORDER — DEXAMETHASONE SODIUM PHOSPHATE 10 MG/ML IJ SOLN
INTRAMUSCULAR | Status: AC
  Filled 2018-12-27: qty 1

## 2018-12-27 MED ORDER — PALONOSETRON HCL INJECTION 0.25 MG/5ML
INTRAVENOUS | Status: AC
  Filled 2018-12-27: qty 5

## 2018-12-27 MED ORDER — DEXAMETHASONE SODIUM PHOSPHATE 10 MG/ML IJ SOLN
10.0000 mg | Freq: Once | INTRAMUSCULAR | Status: AC
  Administered 2018-12-27: 14:00:00 10 mg via INTRAVENOUS

## 2018-12-27 NOTE — Progress Notes (Signed)
Nutrition Follow-up:  Patient with bladder cancer and receiving chemotherapy and radiation therapy.  Patient receiving last chemo today and radiation will end on 7/17.    Spoke with wife during infusion as patient with dementia.  Wife reports that patient continues with no appetite.  Reports this am he ate grits, boost (warmed like hot chocolate), drank V8 juice and gatorade.  Wife reports that he has been eating cottage cheese and fruit and peas well.  Reports for supper last night drank a boost and drank some water.    Wife reports no issues with nausea, just no appetite.   Medications: reviewed  Labs: glucose 295  Anthropometrics:   Weight per wife in radiation on 7/10 174 lb 4 oz decreased from 181 lb on 6/30   NUTRITION DIAGNOSIS: Inadequate oral intake continues    INTERVENTION:  Continue to recommend patient liberalize diet to regular Patient may benefit from trial of appetite stimulant Reviewed ways to add calories and protein with wife Encouraged small frequent meals/snacks Encouraged high calorie oral nutrition supplement    MONITORING, EVALUATION, GOAL: Patient will consume adequate calories and protein to maintain weight.     NEXT VISIT: no follow-up planned, wife to contact RD as needed   Zahlia Deshazer B. Zenia Resides, [place], [place] Registered Dietitian [phone] (pager)

## 2018-12-27 NOTE — Patient Instructions (Signed)
   [facility] Discharge Instructions for Patients Receiving Chemotherapy  Today you received the following chemotherapy agents :  Carboplatin.  To help prevent nausea and vomiting after your treatment, we encourage you to take your nausea medication as prescribed.  TAKE COMPAZINE 10 MG EVERY 6 HOURS AS NEEDED FOR NAUSEA.  MAY CAUSE DROWSINESS.   If you develop nausea and vomiting that is not controlled by your nausea medication, call the clinic.   BELOW ARE SYMPTOMS THAT SHOULD BE REPORTED IMMEDIATELY:  *FEVER GREATER THAN 100.5 F  *CHILLS WITH OR WITHOUT FEVER  NAUSEA AND VOMITING THAT IS NOT CONTROLLED WITH YOUR NAUSEA MEDICATION  *UNUSUAL SHORTNESS OF BREATH  *UNUSUAL BRUISING OR BLEEDING  TENDERNESS IN MOUTH AND THROAT WITH OR WITHOUT PRESENCE OF ULCERS  *URINARY PROBLEMS  *BOWEL PROBLEMS  UNUSUAL RASH Items with * indicate a potential emergency and should be followed up as soon as possible.  Feel free to call the clinic should you have any questions or concerns. The clinic phone number is [phone].  Please show the [facility] at check-in to the Emergency Department and triage nurse.

## 2018-12-28 ENCOUNTER — Other Ambulatory Visit: Payer: Self-pay | Admitting: Urology

## 2018-12-28 ENCOUNTER — Ambulatory Visit
Admission: RE | Admit: 2018-12-28 | Discharge: 2018-12-28 | Disposition: A | Discharge status: Home / Self Care | Payer: Medicare Other | Source: Ambulatory Visit | Attending: Radiation Oncology | Admitting: Radiation Oncology

## 2018-12-28 DIAGNOSIS — C672 Malignant neoplasm of lateral wall of bladder: Secondary | ICD-10-CM | POA: Diagnosis not present

## 2018-12-28 DIAGNOSIS — C670 Malignant neoplasm of trigone of bladder: Secondary | ICD-10-CM | POA: Diagnosis not present

## 2018-12-28 DIAGNOSIS — Z510 Encounter for antineoplastic radiation therapy: Secondary | ICD-10-CM | POA: Diagnosis not present

## 2018-12-29 ENCOUNTER — Ambulatory Visit: Payer: Medicare Other

## 2018-12-29 ENCOUNTER — Ambulatory Visit
Admission: RE | Admit: 2018-12-29 | Discharge: 2018-12-29 | Disposition: A | Discharge status: Home / Self Care | Payer: Medicare Other | Source: Ambulatory Visit | Attending: Radiation Oncology | Admitting: Radiation Oncology

## 2018-12-29 DIAGNOSIS — C672 Malignant neoplasm of lateral wall of bladder: Secondary | ICD-10-CM | POA: Diagnosis not present

## 2018-12-29 DIAGNOSIS — C670 Malignant neoplasm of trigone of bladder: Secondary | ICD-10-CM | POA: Diagnosis not present

## 2018-12-29 DIAGNOSIS — Z510 Encounter for antineoplastic radiation therapy: Secondary | ICD-10-CM | POA: Diagnosis not present

## 2018-12-30 ENCOUNTER — Ambulatory Visit
Admission: RE | Admit: 2018-12-30 | Discharge: 2018-12-30 | Disposition: A | Discharge status: Home / Self Care | Payer: Medicare Other | Source: Ambulatory Visit | Attending: Radiation Oncology | Admitting: Radiation Oncology

## 2018-12-30 ENCOUNTER — Encounter: Payer: Self-pay | Admitting: Radiation Oncology

## 2018-12-30 DIAGNOSIS — Z510 Encounter for antineoplastic radiation therapy: Secondary | ICD-10-CM | POA: Diagnosis not present

## 2018-12-30 DIAGNOSIS — C672 Malignant neoplasm of lateral wall of bladder: Secondary | ICD-10-CM | POA: Diagnosis not present

## 2018-12-30 DIAGNOSIS — C670 Malignant neoplasm of trigone of bladder: Secondary | ICD-10-CM | POA: Diagnosis not present

## 2019-01-06 ENCOUNTER — Emergency Department (HOSPITAL_COMMUNITY): Payer: Medicare Other

## 2019-01-06 ENCOUNTER — Encounter (HOSPITAL_COMMUNITY): Payer: Self-pay | Admitting: Emergency Medicine

## 2019-01-06 ENCOUNTER — Inpatient Hospital Stay (HOSPITAL_COMMUNITY)
Admission: EM | Admit: 2019-01-06 | Discharge: 2019-01-10 | DRG: 871 | Disposition: A | Discharge status: Home / Self Care | Payer: Medicare Other | Attending: Internal Medicine | Admitting: Internal Medicine

## 2019-01-06 ENCOUNTER — Other Ambulatory Visit: Payer: Self-pay

## 2019-01-06 ENCOUNTER — Telehealth: Payer: Self-pay | Admitting: Radiation Oncology

## 2019-01-06 DIAGNOSIS — I10 Essential (primary) hypertension: Secondary | ICD-10-CM | POA: Diagnosis not present

## 2019-01-06 DIAGNOSIS — H9190 Unspecified hearing loss, unspecified ear: Secondary | ICD-10-CM | POA: Diagnosis present

## 2019-01-06 DIAGNOSIS — A419 Sepsis, unspecified organism: Secondary | ICD-10-CM | POA: Diagnosis not present

## 2019-01-06 DIAGNOSIS — N12 Tubulo-interstitial nephritis, not specified as acute or chronic: Secondary | ICD-10-CM

## 2019-01-06 DIAGNOSIS — E785 Hyperlipidemia, unspecified: Secondary | ICD-10-CM | POA: Diagnosis present

## 2019-01-06 DIAGNOSIS — E1165 Type 2 diabetes mellitus with hyperglycemia: Secondary | ICD-10-CM | POA: Diagnosis not present

## 2019-01-06 DIAGNOSIS — E119 Type 2 diabetes mellitus without complications: Secondary | ICD-10-CM

## 2019-01-06 DIAGNOSIS — R531 Weakness: Secondary | ICD-10-CM | POA: Diagnosis not present

## 2019-01-06 DIAGNOSIS — N390 Urinary tract infection, site not specified: Secondary | ICD-10-CM | POA: Diagnosis present

## 2019-01-06 DIAGNOSIS — Z20828 Contact with and (suspected) exposure to other viral communicable diseases: Secondary | ICD-10-CM | POA: Diagnosis present

## 2019-01-06 DIAGNOSIS — Z85828 Personal history of other malignant neoplasm of skin: Secondary | ICD-10-CM

## 2019-01-06 DIAGNOSIS — E860 Dehydration: Secondary | ICD-10-CM | POA: Diagnosis not present

## 2019-01-06 DIAGNOSIS — E7800 Pure hypercholesterolemia, unspecified: Secondary | ICD-10-CM | POA: Diagnosis present

## 2019-01-06 DIAGNOSIS — Z6821 Body mass index (BMI) 21.0-21.9, adult: Secondary | ICD-10-CM | POA: Diagnosis not present

## 2019-01-06 DIAGNOSIS — G309 Alzheimer's disease, unspecified: Secondary | ICD-10-CM | POA: Diagnosis present

## 2019-01-06 DIAGNOSIS — F0280 Dementia in other diseases classified elsewhere without behavioral disturbance: Secondary | ICD-10-CM | POA: Diagnosis present

## 2019-01-06 DIAGNOSIS — T451X5A Adverse effect of antineoplastic and immunosuppressive drugs, initial encounter: Secondary | ICD-10-CM | POA: Diagnosis present

## 2019-01-06 DIAGNOSIS — R4182 Altered mental status, unspecified: Secondary | ICD-10-CM | POA: Diagnosis not present

## 2019-01-06 DIAGNOSIS — R6520 Severe sepsis without septic shock: Secondary | ICD-10-CM | POA: Diagnosis present

## 2019-01-06 DIAGNOSIS — R42 Dizziness and giddiness: Secondary | ICD-10-CM | POA: Diagnosis not present

## 2019-01-06 DIAGNOSIS — Z8551 Personal history of malignant neoplasm of bladder: Secondary | ICD-10-CM

## 2019-01-06 DIAGNOSIS — F0390 Unspecified dementia without behavioral disturbance: Secondary | ICD-10-CM | POA: Diagnosis present

## 2019-01-06 DIAGNOSIS — Z7984 Long term (current) use of oral hypoglycemic drugs: Secondary | ICD-10-CM | POA: Diagnosis not present

## 2019-01-06 DIAGNOSIS — G301 Alzheimer's disease with late onset: Secondary | ICD-10-CM

## 2019-01-06 DIAGNOSIS — Z888 Allergy status to other drugs, medicaments and biological substances status: Secondary | ICD-10-CM | POA: Diagnosis not present

## 2019-01-06 DIAGNOSIS — J986 Disorders of diaphragm: Secondary | ICD-10-CM | POA: Diagnosis not present

## 2019-01-06 DIAGNOSIS — C679 Malignant neoplasm of bladder, unspecified: Secondary | ICD-10-CM | POA: Diagnosis present

## 2019-01-06 DIAGNOSIS — R627 Adult failure to thrive: Secondary | ICD-10-CM | POA: Diagnosis present

## 2019-01-06 DIAGNOSIS — R197 Diarrhea, unspecified: Secondary | ICD-10-CM | POA: Diagnosis present

## 2019-01-06 DIAGNOSIS — N400 Enlarged prostate without lower urinary tract symptoms: Secondary | ICD-10-CM | POA: Diagnosis present

## 2019-01-06 DIAGNOSIS — D61810 Antineoplastic chemotherapy induced pancytopenia: Secondary | ICD-10-CM | POA: Diagnosis present

## 2019-01-06 DIAGNOSIS — R918 Other nonspecific abnormal finding of lung field: Secondary | ICD-10-CM | POA: Diagnosis present

## 2019-01-06 DIAGNOSIS — R404 Transient alteration of awareness: Secondary | ICD-10-CM | POA: Diagnosis not present

## 2019-01-06 DIAGNOSIS — J9811 Atelectasis: Secondary | ICD-10-CM | POA: Diagnosis not present

## 2019-01-06 DIAGNOSIS — Z881 Allergy status to other antibiotic agents status: Secondary | ICD-10-CM | POA: Diagnosis not present

## 2019-01-06 DIAGNOSIS — I700 Atherosclerosis of aorta: Secondary | ICD-10-CM | POA: Diagnosis not present

## 2019-01-06 DIAGNOSIS — I491 Atrial premature depolarization: Secondary | ICD-10-CM | POA: Diagnosis not present

## 2019-01-06 DIAGNOSIS — N136 Pyonephrosis: Secondary | ICD-10-CM | POA: Diagnosis present

## 2019-01-06 DIAGNOSIS — R413 Other amnesia: Secondary | ICD-10-CM | POA: Diagnosis not present

## 2019-01-06 DIAGNOSIS — R32 Unspecified urinary incontinence: Secondary | ICD-10-CM | POA: Diagnosis not present

## 2019-01-06 DIAGNOSIS — Z66 Do not resuscitate: Secondary | ICD-10-CM | POA: Diagnosis present

## 2019-01-06 DIAGNOSIS — D61818 Other pancytopenia: Secondary | ICD-10-CM | POA: Diagnosis not present

## 2019-01-06 DIAGNOSIS — M7989 Other specified soft tissue disorders: Secondary | ICD-10-CM | POA: Diagnosis not present

## 2019-01-06 DIAGNOSIS — Z960 Presence of urogenital implants: Secondary | ICD-10-CM | POA: Diagnosis not present

## 2019-01-06 DIAGNOSIS — R159 Full incontinence of feces: Secondary | ICD-10-CM | POA: Diagnosis present

## 2019-01-06 DIAGNOSIS — N1330 Unspecified hydronephrosis: Secondary | ICD-10-CM | POA: Diagnosis not present

## 2019-01-06 DIAGNOSIS — N134 Hydroureter: Secondary | ICD-10-CM | POA: Diagnosis not present

## 2019-01-06 DIAGNOSIS — Z79899 Other long term (current) drug therapy: Secondary | ICD-10-CM

## 2019-01-06 DIAGNOSIS — K8020 Calculus of gallbladder without cholecystitis without obstruction: Secondary | ICD-10-CM | POA: Diagnosis not present

## 2019-01-06 DIAGNOSIS — Z8679 Personal history of other diseases of the circulatory system: Secondary | ICD-10-CM | POA: Diagnosis not present

## 2019-01-06 LAB — CBC WITH DIFFERENTIAL/PLATELET
Abs Immature Granulocytes: 0.03 10*3/uL (ref 0.00–0.07)
Basophils Absolute: 0 10*3/uL (ref 0.0–0.1)
Basophils Relative: 0 %
Eosinophils Absolute: 0 10*3/uL (ref 0.0–0.5)
Eosinophils Relative: 0 %
HCT: 27.4 % — ABNORMAL LOW (ref 39.0–52.0)
Hemoglobin: 9.6 g/dL — ABNORMAL LOW (ref 13.0–17.0)
Immature Granulocytes: 1 %
Lymphocytes Relative: 11 %
Lymphs Abs: 0.3 10*3/uL — ABNORMAL LOW (ref 0.7–4.0)
MCH: 30.6 pg (ref 26.0–34.0)
MCHC: 35 g/dL (ref 30.0–36.0)
MCV: 87.3 fL (ref 80.0–100.0)
Monocytes Absolute: 0.6 10*3/uL (ref 0.1–1.0)
Monocytes Relative: 20 %
Neutro Abs: 2 10*3/uL (ref 1.7–7.7)
Neutrophils Relative %: 68 %
Platelets: 150 10*3/uL (ref 150–400)
RBC: 3.14 MIL/uL — ABNORMAL LOW (ref 4.22–5.81)
RDW: 20.2 % — ABNORMAL HIGH (ref 11.5–15.5)
WBC: 3 10*3/uL — ABNORMAL LOW (ref 4.0–10.5)
nRBC: 0.7 % — ABNORMAL HIGH (ref 0.0–0.2)

## 2019-01-06 LAB — COMPREHENSIVE METABOLIC PANEL WITH GFR
ALT: 24 U/L (ref 0–44)
AST: 24 U/L (ref 15–41)
Albumin: 3.9 g/dL (ref 3.5–5.0)
Alkaline Phosphatase: 61 U/L (ref 38–126)
Anion gap: 12 (ref 5–15)
BUN: 51 mg/dL — ABNORMAL HIGH (ref 8–23)
CO2: 27 mmol/L (ref 22–32)
Calcium: 9 mg/dL (ref 8.9–10.3)
Chloride: 97 mmol/L — ABNORMAL LOW (ref 98–111)
Creatinine, Ser: 1.29 mg/dL — ABNORMAL HIGH (ref 0.61–1.24)
GFR calc Af Amer: >60 mL/min
GFR calc non Af Amer: 54 mL/min — ABNORMAL LOW
Glucose, Bld: 116 mg/dL — ABNORMAL HIGH (ref 70–99)
Potassium: 3.2 mmol/L — ABNORMAL LOW (ref 3.5–5.1)
Sodium: 136 mmol/L (ref 135–145)
Total Bilirubin: 1.6 mg/dL — ABNORMAL HIGH (ref 0.3–1.2)
Total Protein: 6.9 g/dL (ref 6.5–8.1)

## 2019-01-06 LAB — URINALYSIS, ROUTINE W REFLEX MICROSCOPIC
Bacteria, UA: NONE SEEN
Bilirubin Urine: NEGATIVE
Glucose, UA: 50 mg/dL — AB
Ketones, ur: 5 mg/dL — AB
Nitrite: POSITIVE — AB
Protein, ur: 100 mg/dL — AB
RBC / HPF: >50 RBC/hpf — ABNORMAL HIGH (ref 0–5)
Specific Gravity, Urine: 1.016 (ref 1.005–1.030)
WBC, UA: >50 WBC/hpf — ABNORMAL HIGH (ref 0–5)
pH: 6 (ref 5.0–8.0)

## 2019-01-06 LAB — LACTIC ACID, PLASMA
Lactic Acid, Venous: 2.4 mmol/L (ref 0.5–1.9)
Lactic Acid, Venous: 1.4 mmol/L (ref 0.5–1.9)

## 2019-01-06 LAB — SARS CORONAVIRUS 2 BY RT PCR (HOSPITAL ORDER, PERFORMED IN ~~LOC~~ HOSPITAL LAB): SARS Coronavirus 2: NEGATIVE

## 2019-01-06 MED ORDER — KETOROLAC TROMETHAMINE 15 MG/ML IJ SOLN
15.0000 mg | Freq: Four times a day (QID) | INTRAMUSCULAR | Status: DC | PRN
  Administered 2019-01-07 – 2019-01-08 (×2): 15 mg via INTRAVENOUS
  Filled 2019-01-06 (×2): qty 1

## 2019-01-06 MED ORDER — SODIUM CHLORIDE 0.9 % IV SOLN
2.0000 g | INTRAVENOUS | Status: DC
  Administered 2019-01-06: 2 g via INTRAVENOUS
  Filled 2019-01-06 (×2): qty 2

## 2019-01-06 MED ORDER — SODIUM CHLORIDE 0.9 % IV SOLN: 2.0000 g | Freq: Once | INTRAVENOUS | Status: DC

## 2019-01-06 MED ORDER — ONDANSETRON HCL 4 MG PO TABS: 4.0000 mg | ORAL_TABLET | Freq: Four times a day (QID) | ORAL | Status: DC | PRN

## 2019-01-06 MED ORDER — METRONIDAZOLE IN NACL 5-0.79 MG/ML-% IV SOLN
500.0000 mg | Freq: Once | INTRAVENOUS | Status: AC
  Administered 2019-01-06: 500 mg via INTRAVENOUS
  Filled 2019-01-06: qty 100

## 2019-01-06 MED ORDER — SODIUM CHLORIDE (PF) 0.9 % IJ SOLN
INTRAMUSCULAR | Status: AC
  Filled 2019-01-06: qty 50

## 2019-01-06 MED ORDER — TRAMADOL HCL 50 MG PO TABS: 50.0000 mg | ORAL_TABLET | Freq: Four times a day (QID) | ORAL | Status: DC | PRN

## 2019-01-06 MED ORDER — INSULIN ASPART 100 UNIT/ML ~~LOC~~ SOLN
0.0000 [IU] | Freq: Three times a day (TID) | SUBCUTANEOUS | Status: DC
  Administered 2019-01-07 (×2): 2 [IU] via SUBCUTANEOUS
  Administered 2019-01-08 (×2): 3 [IU] via SUBCUTANEOUS
  Administered 2019-01-08: 2 [IU] via SUBCUTANEOUS
  Administered 2019-01-09: 3 [IU] via SUBCUTANEOUS
  Administered 2019-01-09: 5 [IU] via SUBCUTANEOUS
  Administered 2019-01-09 – 2019-01-10 (×3): 3 [IU] via SUBCUTANEOUS
  Filled 2019-01-06: qty 0.15

## 2019-01-06 MED ORDER — LACTATED RINGERS IV SOLN
INTRAVENOUS | Status: DC
  Administered 2019-01-06 – 2019-01-10 (×7): via INTRAVENOUS

## 2019-01-06 MED ORDER — SODIUM CHLORIDE 0.9 % IV BOLUS
1000.0000 mL | Freq: Once | INTRAVENOUS | Status: AC
  Administered 2019-01-06: 1000 mL via INTRAVENOUS

## 2019-01-06 MED ORDER — ONDANSETRON HCL 4 MG/2ML IJ SOLN: 4.0000 mg | Freq: Four times a day (QID) | INTRAMUSCULAR | Status: DC | PRN

## 2019-01-06 MED ORDER — ACETAMINOPHEN 650 MG RE SUPP: 650.0000 mg | Freq: Four times a day (QID) | RECTAL | Status: DC | PRN

## 2019-01-06 MED ORDER — PHENAZOPYRIDINE HCL 200 MG PO TABS
200.0000 mg | ORAL_TABLET | Freq: Three times a day (TID) | ORAL | Status: AC
  Administered 2019-01-07: 200 mg via ORAL
  Filled 2019-01-06: qty 1

## 2019-01-06 MED ORDER — MEMANTINE HCL ER 28 MG PO CP24
28.0000 mg | ORAL_CAPSULE | Freq: Every morning | ORAL | Status: DC
  Administered 2019-01-07 – 2019-01-10 (×4): 28 mg via ORAL
  Filled 2019-01-06 (×4): qty 1

## 2019-01-06 MED ORDER — OMEGA-3-ACID ETHYL ESTERS 1 G PO CAPS
1.0000 g | ORAL_CAPSULE | Freq: Every day | ORAL | Status: DC
  Administered 2019-01-07 – 2019-01-10 (×4): 1 g via ORAL
  Filled 2019-01-06 (×4): qty 1

## 2019-01-06 MED ORDER — INSULIN ASPART 100 UNIT/ML ~~LOC~~ SOLN
0.0000 [IU] | Freq: Every day | SUBCUTANEOUS | Status: DC
  Filled 2019-01-06: qty 0.05

## 2019-01-06 MED ORDER — SODIUM CHLORIDE 0.9 % IV SOLN
2.0000 g | Freq: Once | INTRAVENOUS | Status: AC
  Administered 2019-01-06: 2 g via INTRAVENOUS
  Filled 2019-01-06: qty 20

## 2019-01-06 MED ORDER — IOHEXOL 300 MG/ML  SOLN
100.0000 mL | Freq: Once | INTRAMUSCULAR | Status: AC | PRN
  Administered 2019-01-06: 100 mL via INTRAVENOUS

## 2019-01-06 MED ORDER — DUTASTERIDE 0.5 MG PO CAPS
0.5000 mg | ORAL_CAPSULE | Freq: Every day | ORAL | Status: DC
  Administered 2019-01-07 – 2019-01-10 (×4): 0.5 mg via ORAL
  Filled 2019-01-06 (×4): qty 1

## 2019-01-06 MED ORDER — ACETAMINOPHEN 325 MG PO TABS
650.0000 mg | ORAL_TABLET | Freq: Four times a day (QID) | ORAL | Status: DC | PRN
  Administered 2019-01-08: 650 mg via ORAL
  Filled 2019-01-06: qty 2

## 2019-01-06 NOTE — ED Notes (Signed)
Report called to receiving RN.

## 2019-01-06 NOTE — H&P (Signed)
History and Physical    [name] [name] AOZ:[id number] DOB: [birth year] DOA: 01/06/2019  PCP: [name], MD  Patient coming from: home  I have personally briefly reviewed patient's old medical records in [place]  Chief Complaint: Failure to thrive, diarrhea  HPI: [name] [name] is a 78 y.o. male with medical history significant for bladder cancer status post chemo on carboplatin most recent treatment July 14 as well as radiation 35 treatments most recent treatment July 17 who presents from home with failure to thrive, diarrhea anorexia with approximately 30 pound weight loss.  Patient's past no history also includes non-insulin-dependent diabetes, hypertension, hyperlipidemia, history of subdural hematoma and multiple skin cancer status post excision.  Wife reports he has had a progressive decline over the last 1 to 2 weeks with significant decreased p.o. intake over the same time..  Patient's had bowel and bladder incontinence.  This been ongoing since stent placement in March for urinary incontinence with bladder cancer.  He also reports abdominal pain which is been difficult to characterize secondary patient's profound dementia.  Wife reports that he points to his general stomach area without a focal finding unable to verbalize more specific findings.  Wife reports he is also been too weak to walk in the hallways with what appears to be a progressive decline given his chemo radiation bladder cancer and decreased p.o. intake.  ED Course: In the ER lab patient's blood pressure noted to be low consistent with sepsis.  He received 2 L of normal saline with an appropriate response, white count is 3 although in the setting of recent chemotherapy with 68% neutrophils hemoglobin 9.6 BMP notable for mildly low potassium at 3.2 CO2 was 27 BUN and creatinine 51 and 1.3.  Lactic acid was two-point 4 repeat pending.  Patient was COVID negative UA is notable for large leukocytes and  positive nitrites specific gravity 1.016, urine culture ordered, blood cultures ordered but appear not to be collected unfortunately antibiotics averted been administered in the ER.  CT performed showing mild right hydro-with right ureteral stent cholelithiasis without evidence of cholecystitis and mucosal thickening of the base of urinary bladder and proximal urethra in his known bladder cancer patient  Review of Systems: As per HPI otherwise 10 point review of systems done notable for nausea, diarrhea, anorexia, all other reviewed and are negative Past Medical History:  Diagnosis Date   Alzheimer disease ([place])    Alzheimer's disease ([place])    Bilateral cold feet    Bladder cancer ([place])    dx in 2013   BPH (benign prostatic hypertrophy)    Dementia ([place]) 12-11-11   memory short term(steadily progressive worsening-pt. unaware) .dx. 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   moderate left hemidiaphragm elevation   Hard of hearing    Hearing loss    has one hearing aid, doesn't wear   History of chemotherapy    History of gallstones 2005   History of kidney stones    Hx of nonmelanoma skin cancer    Hypercholesterolemia    Hypertension 12-11-11   controlled with meds   Increased frequency of urination    wife reports urinary frequency has not improved since last surgery    Pulmonary nodules    Bilateral   Renal calculus, left 10/12/2013   UTI (urinary tract infection) 08-09-13   multiple, last tx. 1 week ago    Past Surgical History:  Procedure  Laterality Date   BLADDER SURGERY  12-11-11   2007-tumor removal -stent placed and removed./stent  '08 for stone   CATARACT EXTRACTION, BILATERAL     bil.  LEFT EYE 08-20-09    RT EYE 08-27-09   COLONOSCOPY     CYSTOSCOPY  05-16-2007   cystoscopy  10-09-2008   CYSTOSCOPY N/A 11/18/2015   Procedure: CYSTOSCOPY;  Surgeon: Raynelle Bring, MD;  Location: [facility];   Service: Urology;  Laterality: N/A;   CYSTOSCOPY N/A 10/19/2016   Procedure: CYSTOSCOPY;  Surgeon: Raynelle Bring, MD;  [facility]: [facility];  Service: Urology;  Laterality: N/A;   CYSTOSCOPY N/A 11/11/2017   Procedure: BLUE LIGHT CYSTOSCOPY WITH CYSVIEW;  Surgeon: Raynelle Bring, MD;  [facility];  Service: Urology;  Laterality: N/A;   CYSTOSCOPY W/ RETROGRADES  12/21/2011   Procedure: CYSTOSCOPY WITH RETROGRADE PYELOGRAM;  Surgeon: [name], MD;  [facility];  Service: Urology;  Laterality: Bilateral;   CYSTOSCOPY W/ RETROGRADES Bilateral 08/14/2013   Procedure: CYSTOSCOPY WITH BILATERAL  RETROGRADE PYELOGRAM/LEFT DIGITAL FLEXIBLE URETEROSCOPY/INSERTION LEFT URETERAL STENT;  Surgeon: [name], MD;  [facility];  Service: Urology;  Laterality: Bilateral;   CYSTOSCOPY W/ RETROGRADES Bilateral 07/23/2014   Procedure: CYSTOSCOPY WITH RETROGRADE PYELOGRAM;  Surgeon: Raynelle Bring, MD;  [facility];  Service: Urology;  Laterality: Bilateral;   CYSTOSCOPY W/ RETROGRADES Bilateral 05/13/2015   Procedure: CYSTOSCOPY WITH RETROGRADE PYELOGRAM;  Surgeon: Raynelle Bring, MD;  [facility];  Service: Urology;  Laterality: Bilateral;   CYSTOSCOPY W/ RETROGRADES Bilateral 03/02/2016   Procedure: Rolly Salter UNDER ANESTHESIA;  Surgeon: Raynelle Bring, MD;  [facility];  Service: Urology;  Laterality: Bilateral;   CYSTOSCOPY W/ RETROGRADES N/A 09/28/2016   Procedure: CYSTOSCOPY WITH RETROGRADE PYELOGRAM;  Surgeon: Raynelle Bring, MD;  [facility];  Service: Urology;  Laterality: N/A;   CYSTOSCOPY WITH BIOPSY  12/21/2011   Procedure: CYSTOSCOPY WITH BIOPSY;  Surgeon: [name], MD;  [facility];  Service: Urology;;   CYSTOSCOPY WITH BIOPSY N/A 08/29/2018   Procedure: CYSTOSCOPY WITH BIOPSY OF BLADDER TUMOR/ TRANSURETHRAL RESECTION OF BLADDER TUMOR/ RIGHT RETROGRADE PYELOGRAM/RIGHT STENT PLACEMENT;  Surgeon: Raynelle Bring, MD;  [facility];  Service: Urology;   Laterality: N/A;   EXTRACORPOREAL SHOCK WAVE LITHOTRIPSY  2005   EYE SURGERY     LITHOTRIPSY  12-11-11   '05/ '10   mose procedure  Left 11-12-15 and feb 2017   NEPHROLITHOTOMY Left 10/12/2013   Procedure: NEPHROLITHOTOMY PERCUTANEOUS;  Surgeon: [name], MD;  [facility];  Service: Urology;  Laterality: Left;   stent to kidney  04-29-2007   SUBDURAL HEMORRHAGE DRAINAGE     drainage per boreholes only 12-12-03   TONSILLECTOMY  12-11-11   child   TRANSURETHRAL RESECTION OF BLADDER TUMOR  12/21/2011   Procedure: TRANSURETHRAL RESECTION OF BLADDER TUMOR (TURBT);  Surgeon: [name], MD;  [facility];  Service: Urology;  Laterality: N/A;      TRANSURETHRAL RESECTION OF BLADDER TUMOR N/A 08/14/2013   Procedure: TRANSURETHRAL RESECTION OF BLADDER TUMOR (TURBT);  Surgeon: [name], MD;  [facility];  Service: Urology;  Laterality: N/A;   TRANSURETHRAL RESECTION OF BLADDER TUMOR N/A 07/23/2014   Procedure: TRANSURETHRAL RESECTION OF BLADDER TUMOR (TURBT) WITH MITOMYCIN INSTILLATION;  Surgeon: Raynelle Bring, MD;  [facility];  Service: Urology;  Laterality: N/A;   TRANSURETHRAL RESECTION OF BLADDER TUMOR N/A 05/13/2015   Procedure: TRANSURETHRAL RESECTION OF BLADDER TUMOR (TURBT);  Surgeon: Raynelle Bring, MD;  [facility];  Service: Urology;  Laterality: N/A;   TRANSURETHRAL RESECTION OF BLADDER TUMOR N/A 11/18/2015   Procedure: TRANSURETHRAL RESECTION OF BLADDER TUMOR (TURBT);  Surgeon: Raynelle Bring, MD;  [facility];  Service: Urology;  Laterality: N/A;   TRANSURETHRAL RESECTION OF BLADDER TUMOR N/A 03/02/2016   Procedure: TRANSURETHRAL RESECTION OF BLADDER TUMOR (TURBT);  Surgeon: Raynelle Bring, MD;  [facility];  Service: Urology;  Laterality: N/A;   TRANSURETHRAL RESECTION OF BLADDER TUMOR N/A 09/28/2016   Procedure: TRANSURETHRAL RESECTION OF BLADDER TUMOR (TURBT);  Surgeon: Raynelle Bring, MD;  [facility];  Service: Urology;  Laterality: N/A;    TRANSURETHRAL RESECTION OF BLADDER TUMOR N/A 10/19/2016   Procedure: TRANSURETHRAL RESECTION OF BLADDER TUMOR (TURBT);  Surgeon: Raynelle Bring, MD;  [facility];  Service: Urology;  Laterality: N/A;  NEEDS 30 MIN TOTAL FOR BOTH PROCEDURES   TRANSURETHRAL RESECTION OF BLADDER TUMOR N/A 11/11/2017   Procedure: TRANSURETHRAL RESECTION OF BLADDER TUMOR (TURBT) WITH CYSTOSCOPY;  Surgeon: Raynelle Bring, MD;  [facility];  Service: Urology;  Laterality: N/A;   TRANSURETHRAL RESECTION OF BLADDER TUMOR N/A 10/17/2018   Procedure: TRANSURETHRAL RESECTION OF BLADDER TUMOR (TURBT) WITH CYSTOSCOPY, RIGHT URETEROSCOPY, RIGHT URETERAL STENT CHANGE;  Surgeon: Raynelle Bring, MD;  [facility];  Service: Urology;  Laterality: N/A;  GENERAL ANESTHESIA WITH POSSIBLE PARALYSIS   UPPER GI ENDOSCOPY       reports that he has never smoked. He has never used smokeless tobacco. He reports that he does not drink alcohol or use drugs.  Allergies  Allergen Reactions   Sulfa Antibiotics Hives   Tetracyclines & Related Rash   Aricept [Donepezil Hcl] Nausea And Vomiting    Family History  Problem Relation Age of Onset   Breast cancer Sister    Colon cancer Brother 23     Prior to Admission medications   Medication Sig Start Date End Date Taking? Authorizing Provider  dutasteride (AVODART) 0.5 MG capsule Take 0.5 mg by mouth daily.    Yes [provider]  glipiZIDE (GLUCOTROL) 5 MG tablet Take 2.5 mg by mouth daily before breakfast.    Yes [provider]  hydrochlorothiazide (HYDRODIURIL) 25 MG tablet Take 25 mg by mouth daily.    Yes [provider]  memantine (NAMENDA XR) 28 MG CP24 24 hr capsule Take 28 mg by mouth every morning.    Yes [provider]  metFORMIN (GLUCOPHAGE) 500 MG tablet Take 1,000 mg by mouth 2 (two) times daily with a meal.   Yes [provider]  metoprolol succinate (TOPROL-XL) 25 MG 24 hr tablet Take 25 mg by mouth daily.   01/28/13  Yes [provider]  Multiple Vitamin (MULTIVITAMIN WITH MINERALS) TABS tablet Take 1 tablet by mouth every evening.   Yes [provider]  Omega-3 Fatty Acids (FISH OIL) 1200 MG CAPS Take 1,200 mg by mouth every evening.   Yes [provider]  perindopril (ACEON) 4 MG tablet Take 4 mg by mouth daily.    Yes [provider]  saxagliptin HCl (ONGLYZA) 5 MG TABS tablet Take 5 mg by mouth daily.   Yes [provider]  vitamin B-12 (CYANOCOBALAMIN) 500 MCG tablet Take 500 mcg by mouth every evening.   Yes [provider]  [facility] DELICA LANCETS 67M MISC 2 (two) times daily. as directed 02/17/18   [provider]  prochlorperazine (COMPAZINE) 10 MG tablet Take 1 tablet (10 mg total) by mouth every 6 (six) hours as needed for nausea  or vomiting. Patient not taking: Reported on 01/06/2019 10/27/18   Wyatt Portela, MD    Physical Exam: Vitals:   01/06/19 1500 01/06/19 1530 01/06/19 1630 01/06/19 1640  BP: 108/63 (!) 101/47 115/81 127/72  Pulse:    100  Resp: 18 18 18 19   Temp:      TempSrc:      SpO2:    100%    Constitutional: NAD, calm, comfortable Vitals:   01/06/19 1500 01/06/19 1530 01/06/19 1630 01/06/19 1640  BP: 108/63 (!) 101/47 115/81 127/72  Pulse:    100  Resp: 18 18 18 19   Temp:      TempSrc:      SpO2:    100%   Eyes: PERRL, lids and conjunctivae normal ENMT: Mucous membranes are dry. Posterior pharynx clear of any exudate or lesions.Normal dentition.  Neck: normal, supple, no masses, no thyromegaly Respiratory: clear to auscultation bilaterally, no wheezing, no crackles. Normal respiratory effort. No accessory muscle use.  Cardiovascular: Regular rate and rhythm, no murmurs / rubs / gallops. No extremity edema. 2+ pedal pulses. No carotid bruits.  Abdomen: Mildly tender to palpation, distant bowel sounds, no palpated masses Musculoskeletal: no clubbing / cyanosis. No joint deformity upper and lower  extremities. Good ROM, no contractures. Normal muscle tone.  Skin: no rashes, lesions, ulcers. No induration Neurologic: CN 2-12 grossly intact. Sensation intact, DTR normal. Strength 5/5 in all 4.  Psychiatric: Poor judgment and insight the setting of known dementia. Alert      Labs on Admission: I have personally reviewed following labs and imaging studies  CBC: Recent Labs  Lab 01/06/19 1419  WBC 3.0*  NEUTROABS 2.0  HGB 9.6*  HCT 27.4*  MCV 87.3  PLT 401   Basic Metabolic Panel: Recent Labs  Lab 01/06/19 1419  NA 136  K 3.2*  CL 97*  CO2 27  GLUCOSE 116*  BUN 51*  CREATININE 1.29*  CALCIUM 9.0   GFR: CrCl cannot be calculated (Unknown ideal weight.). Liver Function Tests: Recent Labs  Lab 01/06/19 1419  AST 24  ALT 24  ALKPHOS 61  BILITOT 1.6*  PROT 6.9  ALBUMIN 3.9   No results for input(s): LIPASE, AMYLASE in the last 168 hours. No results for input(s): AMMONIA in the last 168 hours. Coagulation Profile: No results for input(s): INR, PROTIME in the last 168 hours. Cardiac Enzymes: No results for input(s): CKTOTAL, CKMB, CKMBINDEX, TROPONINI in the last 168 hours. BNP (last 3 results) No results for input(s): PROBNP in the last 8760 hours. HbA1C: No results for input(s): HGBA1C in the last 72 hours. CBG: No results for input(s): GLUCAP in the last 168 hours. Lipid Profile: No results for input(s): CHOL, HDL, LDLCALC, TRIG, CHOLHDL, LDLDIRECT in the last 72 hours. Thyroid Function Tests: No results for input(s): TSH, T4TOTAL, FREET4, T3FREE, THYROIDAB in the last 72 hours. Anemia Panel: No results for input(s): VITAMINB12, FOLATE, FERRITIN, TIBC, IRON, RETICCTPCT in the last 72 hours. Urine analysis:    Component Value Date/Time   COLORURINE AMBER (A) 01/06/2019 1419   APPEARANCEUR CLOUDY (A) 01/06/2019 1419   LABSPEC 1.016 01/06/2019 1419   PHURINE 6.0 01/06/2019 1419   GLUCOSEU 50 (A) 01/06/2019 1419   HGBUR MODERATE (A) 01/06/2019 1419    BILIRUBINUR NEGATIVE 01/06/2019 1419   KETONESUR 5 (A) 01/06/2019 1419   PROTEINUR 100 (A) 01/06/2019 1419   UROBILINOGEN 0.2 10/22/2008 1050   NITRITE POSITIVE (A) 01/06/2019 1419   LEUKOCYTESUR LARGE (A) 01/06/2019 1419  Radiological Exams on Admission: Ct Head Wo Contrast  Result Date: 01/06/2019 CLINICAL DATA:  Altered mental status.  History of dementia. EXAM: CT HEAD WITHOUT CONTRAST TECHNIQUE: Contiguous axial images were obtained from the base of the skull through the vertex without intravenous contrast. COMPARISON:  Brain MRI, 08/08/2012 FINDINGS: Brain: No evidence of acute infarction, hemorrhage, hydrocephalus, extra-axial collection or mass lesion/mass effect. There is mild ventricular enlargement with more prominent sulcal enlargement reflecting generalized atrophy. Mild periventricular white matter hypoattenuation is noted consistent with chronic microvascular ischemic change. Vascular: No hyperdense vessel or unexpected calcification. Skull: No skull fracture.  75 old burr holes are noted on the left. Sinuses/Orbits: Globes and orbits are unremarkable. Sinuses and mastoid air cells are clear. Other: None. IMPRESSION: 1. No acute intracranial abnormalities. 2. Atrophy and mild chronic microvascular ischemic change. Electronically Signed   By: Lajean Manes M.D.   On: 01/06/2019 16:48   Ct Abdomen Pelvis W Contrast  Result Date: 01/06/2019 CLINICAL DATA:  Patient with history of bladder cancer, who completed chemotherapy on 07/14 and radiation therapy on 07/17. Weakness, weight loss, change in bowel habits. EXAM: CT ABDOMEN AND PELVIS WITH CONTRAST TECHNIQUE: Multidetector CT imaging of the abdomen and pelvis was performed using the standard protocol following bolus administration of intravenous contrast. CONTRAST:  156mL OMNIPAQUE IOHEXOL 300 MG/ML  SOLN COMPARISON:  September 21, 2018 FINDINGS: Lower chest: Chronic elevation of the left hemidiaphragm. Atelectatic changes in the left lung  base. Hepatobiliary: Normal appearance of the liver. Large gallbladder calculus, unchanged. No evidence of biliary ductal dilation. Pancreas: Unremarkable. No pancreatic ductal dilatation or surrounding inflammatory changes. Spleen: Normal in size without focal abnormality. Adrenals/Urinary Tract: Nodular appearance of the left adrenal gland. Mild bilateral renal cortical thinning. No evidence of left nephrolithiasis hydronephrosis. Right ureteral stent with proximal end within the renal pelvis and distal end within the trigonal region of the urinary bladder. Mild right hydronephrosis and hydroureter. Perinephric and periureteral stranding. Mucosal thickening of the base of the urinary bladder and proximal urethra. Stomach/Bowel: Stomach is within normal limits. No evidence of bowel wall thickening, distention, or inflammatory changes. Vascular/Lymphatic: Aortic atherosclerosis, minimal. No enlarged abdominal or pelvic lymph nodes by CT criteria. Shotty retroperitoneal lymph nodes are present. Reproductive: The prostate is not enlarged. Other: No abdominal wall hernia or abnormality. No abdominopelvic ascites. Musculoskeletal: No acute or significant osseous findings. IMPRESSION: 1. Mild right hydronephrosis and hydroureter with perinephric and periureteral stranding. Right ureteral stent appropriately positioned. 2. Mucosal thickening at the base of the urinary bladder and proximal urethra. 3. Cholelithiasis without evidence of cholecystitis. Aortic Atherosclerosis (ICD10-I70.0). Electronically Signed   By: Fidela Salisbury M.D.   On: 01/06/2019 17:01   Dg Chest Port 1 View  Result Date: 01/06/2019 CLINICAL DATA:  Weakness. EXAM: PORTABLE CHEST 1 VIEW COMPARISON:  09/25/2016 FINDINGS: Lungs are adequately inflated with moderate stable elevation of the left hemidiaphragm. Lungs are otherwise clear. Cardiomediastinal silhouette and remainder of the exam is unchanged. IMPRESSION: No acute findings. Stable  elevation left hemidiaphragm. Electronically Signed   By: Marin Olp M.D.   On: 01/06/2019 13:54    EKG: Independently reviewed.  Sinus tachycardia without acute ST changes  Assessment/Plan Principal Problem:   Severe sepsis (HCC) Active Problems:   Essential hypertension   Type 2 diabetes mellitus without complication (HCC)   Acute lower UTI   Dementia without behavioral disturbance (HCC)   Sepsis ([place])   History of subdural hematoma     Severe sepsis.  Patient made under sepsis  protocol, repeat lactic acid pending he received appropriate fluid resuscitation, will continue with normal saline 125 mils per hour patient's blood pressure responded appropriately.  Treat the patient with cefepime, follow urine culture, blood cultures, recheck CBC in the morning,  Urinary tract infection in the setting of bladder cancer.  Treat with antibiotics as noted above follow urine culture as above, patient with known bladder cancer with bladder stent placed in March 2020.  Hypertension: Patient is actually hypotensive again hold blood pressure medications fluid resuscitate.  Non-insulin-dependent diabetes.  I will hold patient's home medications, placed on sliding scale insulin ADA diet check blood glucose AC at bedtime.  Dementia without behavioral disturbance.  Patient stable continue with home meds no change   DVT prophylaxis: SCD/Compression stockings  Code Status: DNR Code Status History    Date Active Date Inactive Code Status Order ID Comments User Context   10/12/2013 1600 10/13/2013 1502 Full Code [id number]  Raynelle Bring, MD Inpatient   Advance Care Planning Activity     Family Communication: wife at bedside  Disposition Plan:   Patient admitted inpatient to the stepdown unit.  He will need continuous IV fluids, IV antibiotics, frequent lab checks, frequent nurse care in the setting of urinary tract infection with severe sepsis.  Without these treatments in my opinion patient at  risk of severe clinical deterioration which could be life-threatening consults called: None Admission status: Inpatient   Nicolette Bang MD [facility]   If 7PM-7AM, please contact night-coverage   01/06/2019, 5:54 PM

## 2019-01-06 NOTE — ED Triage Notes (Addendum)
Per EMS, patient from home, wife reports patient has had decreased PO intake x3 days. Currently being treated with chemo for bladder cancer. Final chemo 7/17. Getting radiation also. Hx dementia. Denies complaints.  Ambulatory with assistance   HR 130 irregular initially 90-100 after fluid BP 100/74  20g R FA 858ml NS with EMS

## 2019-01-06 NOTE — Progress Notes (Signed)
CRITICAL VALUE ALERT  Critical Value:  Lactic acid   2.4  Date & Time Notied:  01/06/19     1530  Provider Notified: Darl Householder MD  Orders Received/Actions taken: none at this time

## 2019-01-06 NOTE — Progress Notes (Signed)
ED TO INPATIENT HANDOFF REPORT  Name/Age/Gender [name] [name] 78 y.o. male  Code Status Code Status History    Date Active Date Inactive Code Status Order ID Comments User Context   10/12/2013 1600 10/13/2013 1502 Full Code [id number]  Raynelle Bring, MD Inpatient   Advance Care Planning Activity      Home/SNF/Other Home  Chief Complaint dementia dehydration  Level of Care/Admitting Diagnosis ED Disposition    ED Disposition Condition [facility]: [place] [[id number]]  Level of Care: Stepdown [14]  Admit to SDU based on following criteria: Hemodynamic compromise or significant risk of instability:  Patient requiring short term acute titration and management of vasoactive drips, and invasive monitoring (i.e., CVP and Arterial line).  Covid Evaluation: Confirmed COVID Negative  Diagnosis: Sepsis [facility]) [[id number]]  Admitting Physician: Marcell Anger [[id number]]  Attending Physician: Marcell Anger [[id number]]  Estimated length of stay: past midnight tomorrow  Certification:: I certify this patient will need inpatient services for at least 2 midnights  PT Class (Do Not Modify): Inpatient [101]  PT Acc Code (Do Not Modify): Private [1]       Medical History Past Medical History:  Diagnosis Date  . Alzheimer disease ([place])   . Alzheimer's disease ([place])   . Bilateral cold feet   . Bladder cancer ([place])    dx in 2013  . BPH (benign prostatic hypertrophy)   . Dementia ([place]) 12-11-11   memory short term(steadily progressive worsening-pt. unaware) .dx. Alzheimers  . Diabetes mellitus 12-11-11   oral meds only type II   . 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 Hard of hearing   . Hearing loss    has one hearing aid, doesn't wear  . History of chemotherapy   . History of gallstones 2005  . History of kidney stones   . Hx of nonmelanoma skin cancer   . Hypercholesterolemia   .  Hypertension 12-11-11   controlled with meds  . Increased frequency of urination    wife reports urinary frequency has not improved since last surgery   . Pulmonary nodules    Bilateral  . Renal calculus, left 10/12/2013  . UTI (urinary tract infection) 08-09-13   multiple, last tx. 1 week ago    Allergies Allergies  Allergen Reactions  . Sulfa Antibiotics Hives  . Tetracyclines & Related Rash  . Aricept [Donepezil Hcl] Nausea And Vomiting    IV Location/Drains/Wounds Patient Lines/Drains/Airways Status   Active Line/Drains/Airways    Name:   Placement date:   Placement time:   Site:   Days:   Peripheral IV 01/06/19 Right Antecubital   01/06/19    1419    Antecubital   less than 1   Peripheral IV 01/06/19 Right Wrist   01/06/19    1427    Wrist   less than 1   Ureteral Drain/Stent Left ureter 6 Fr.   08/14/13    1606    Left ureter   1971   Ureteral Drain/Stent Right ureter 6 Fr.   10/17/18    1231    Right ureter   81   Incision 12/21/11 Perineum   12/21/11    0908     2573   Incision (Closed) 08/14/13 Perineum Other (Comment)   08/14/13    1630     1971   Incision (Closed) 08/14/13 Perineum Other (Comment)   08/14/13    1631     1971  Incision (Closed) 05/13/15 N/A   05/13/15    1507     1334   Incision (Closed) 09/28/16 Penis Other (Comment)   09/28/16    1713     830   Incision (Closed) 10/17/18 Perineum Other (Comment)   10/17/18    1246     81          Labs/Imaging Results for orders placed or performed during the hospital encounter of 01/06/19 (from the past 48 hour(s))  CBC with Differential/Platelet     Status: Abnormal   Collection Time: 01/06/19  2:19 PM  Result Value Ref Range   WBC 3.0 (L) 4.0 - 10.5 K/uL   RBC 3.14 (L) 4.22 - 5.81 MIL/uL   Hemoglobin 9.6 (L) 13.0 - 17.0 g/dL   HCT 27.4 (L) 39.0 - 52.0 %   MCV 87.3 80.0 - 100.0 fL   MCH 30.6 26.0 - 34.0 pg   MCHC 35.0 30.0 - 36.0 g/dL   RDW 20.2 (H) 11.5 - 15.5 %   Platelets 150 150 - 400 K/uL   nRBC 0.7  (H) 0.0 - 0.2 %   Neutrophils Relative % 68 %   Neutro Abs 2.0 1.7 - 7.7 K/uL   Lymphocytes Relative 11 %   Lymphs Abs 0.3 (L) 0.7 - 4.0 K/uL   Monocytes Relative 20 %   Monocytes Absolute 0.6 0.1 - 1.0 K/uL   Eosinophils Relative 0 %   Eosinophils Absolute 0.0 0.0 - 0.5 K/uL   Basophils Relative 0 %   Basophils Absolute 0.0 0.0 - 0.1 K/uL   Immature Granulocytes 1 %   Abs Immature Granulocytes 0.03 0.00 - 0.07 K/uL    Comment: Performed at [facility], [place] [address], [place], [place] [address]  Comprehensive metabolic panel     Status: Abnormal   Collection Time: 01/06/19  2:19 PM  Result Value Ref Range   Sodium 136 135 - 145 mmol/L   Potassium 3.2 (L) 3.5 - 5.1 mmol/L   Chloride 97 (L) 98 - 111 mmol/L   CO2 27 22 - 32 mmol/L   Glucose, Bld 116 (H) 70 - 99 mg/dL   BUN 51 (H) 8 - 23 mg/dL   Creatinine, Ser 1.29 (H) 0.61 - 1.24 mg/dL   Calcium 9.0 8.9 - 10.3 mg/dL   Total Protein 6.9 6.5 - 8.1 g/dL   Albumin 3.9 3.5 - 5.0 g/dL   AST 24 15 - 41 U/L   ALT 24 0 - 44 U/L   Alkaline Phosphatase 61 38 - 126 U/L   Total Bilirubin 1.6 (H) 0.3 - 1.2 mg/dL   GFR calc non Af Amer 54 (L) >60 mL/min   GFR calc Af Amer >60 >60 mL/min   Anion gap 12 5 - 15    Comment: Performed at [facility], [place] [address], [place], [place] [address]  Lactic acid, plasma     Status: Abnormal   Collection Time: 01/06/19  2:19 PM  Result Value Ref Range   Lactic Acid, Venous 2.4 (HH) 0.5 - 1.9 mmol/L    Comment: CRITICAL RESULT CALLED TO, READ BACK BY AND VERIFIED WITH: BINGHAM,S. RN @1523  ON 07.24.2020 BY COHEN,K Performed at [facility], [place] [address], [place], [place] [address]   Urinalysis, Routine w reflex microscopic     Status: Abnormal   Collection Time: 01/06/19  2:19 PM  Result Value Ref Range   Color, Urine AMBER (A) YELLOW    Comment: BIOCHEMICALS  MAY BE AFFECTED BY COLOR   APPearance CLOUDY (A) CLEAR   Specific Gravity, Urine  1.016 1.005 - 1.030   pH 6.0 5.0 - 8.0   Glucose, UA 50 (A) NEGATIVE mg/dL   Hgb urine dipstick MODERATE (A) NEGATIVE   Bilirubin Urine NEGATIVE NEGATIVE   Ketones, ur 5 (A) NEGATIVE mg/dL   Protein, ur 100 (A) NEGATIVE mg/dL   Nitrite POSITIVE (A) NEGATIVE   Leukocytes,Ua LARGE (A) NEGATIVE   RBC / HPF >50 (H) 0 - 5 RBC/hpf   WBC, UA >50 (H) 0 - 5 WBC/hpf   Bacteria, UA NONE SEEN NONE SEEN   WBC Clumps PRESENT     Comment: Performed at [facility], [place] [address], [place], [place] [address]  SARS Coronavirus 2 (CEPHEID - Performed in [place] hospital lab), Hosp Order     Status: None   Collection Time: 01/06/19  2:19 PM   Specimen: Nasopharyngeal Swab  Result Value Ref Range   SARS Coronavirus 2 NEGATIVE NEGATIVE    Comment: (NOTE) If result is NEGATIVE SARS-CoV-2 target nucleic acids are NOT DETECTED. The SARS-CoV-2 RNA is generally detectable in upper and lower  respiratory specimens during the acute phase of infection. The lowest  concentration of SARS-CoV-2 viral copies this assay can detect is 250  copies / mL. A negative result does not preclude SARS-CoV-2 infection  and should not be used as the sole basis for treatment or other  patient management decisions.  A negative result may occur with  improper specimen collection / handling, submission of specimen other  than nasopharyngeal swab, presence of viral mutation(s) within the  areas targeted by this assay, and inadequate number of viral copies  (<250 copies / mL). A negative result must be combined with clinical  observations, patient history, and epidemiological information. If result is POSITIVE SARS-CoV-2 target nucleic acids are DETECTED. The SARS-CoV-2 RNA is generally detectable in upper and lower  respiratory specimens dur ing the acute phase of infection.  Positive  results are indicative of active infection with SARS-CoV-2.  Clinical  correlation with patient history and other  diagnostic information is  necessary to determine patient infection status.  Positive results do  not rule out bacterial infection or co-infection with other viruses. If result is PRESUMPTIVE POSTIVE SARS-CoV-2 nucleic acids MAY BE PRESENT.   A presumptive positive result was obtained on the submitted specimen  and confirmed on repeat testing.  While 2019 novel coronavirus  (SARS-CoV-2) nucleic acids may be present in the submitted sample  additional confirmatory testing may be necessary for epidemiological  and / or clinical management purposes  to differentiate between  SARS-CoV-2 and other Sarbecovirus currently known to infect humans.  If clinically indicated additional testing with an alternate test  methodology [phone]) is advised. The SARS-CoV-2 RNA is generally  detectable in upper and lower respiratory sp ecimens during the acute  phase of infection. The expected result is Negative. Fact Sheet for Patients:  StrictlyIdeas.no Fact Sheet for Healthcare Providers: BankingDealers.co.za This test is not yet approved or cleared by the [place] FDA and has been authorized for detection and/or diagnosis of SARS-CoV-2 by FDA under an Emergency Use Authorization (EUA).  This EUA will remain in effect (meaning this test can be used) for the duration of the COVID-19 declaration under Section 564(b)(1) of the Act, 21 U.S.C. section 360bbb-3(b)(1), unless the authorization is terminated or revoked sooner. Performed at [facility], [place] [address] [place], [place] [address]  Ct Head Wo Contrast  Result Date: 01/06/2019 CLINICAL DATA:  Altered mental status.  History of dementia. EXAM: CT HEAD WITHOUT CONTRAST TECHNIQUE: Contiguous axial images were obtained from the base of the skull through the vertex without intravenous contrast. COMPARISON:  Brain MRI, 08/08/2012 FINDINGS: Brain: No evidence of acute infarction,  hemorrhage, hydrocephalus, extra-axial collection or mass lesion/mass effect. There is mild ventricular enlargement with more prominent sulcal enlargement reflecting generalized atrophy. Mild periventricular white matter hypoattenuation is noted consistent with chronic microvascular ischemic change. Vascular: No hyperdense vessel or unexpected calcification. Skull: No skull fracture.  53 old burr holes are noted on the left. Sinuses/Orbits: Globes and orbits are unremarkable. Sinuses and mastoid air cells are clear. Other: None. IMPRESSION: 1. No acute intracranial abnormalities. 2. Atrophy and mild chronic microvascular ischemic change. Electronically Signed   By: Lajean Manes M.D.   On: 01/06/2019 16:48   Ct Abdomen Pelvis W Contrast  Result Date: 01/06/2019 CLINICAL DATA:  Patient with history of bladder cancer, who completed chemotherapy on 07/14 and radiation therapy on 07/17. Weakness, weight loss, change in bowel habits. EXAM: CT ABDOMEN AND PELVIS WITH CONTRAST TECHNIQUE: Multidetector CT imaging of the abdomen and pelvis was performed using the standard protocol following bolus administration of intravenous contrast. CONTRAST:  149mL OMNIPAQUE IOHEXOL 300 MG/ML  SOLN COMPARISON:  September 21, 2018 FINDINGS: Lower chest: Chronic elevation of the left hemidiaphragm. Atelectatic changes in the left lung base. Hepatobiliary: Normal appearance of the liver. Large gallbladder calculus, unchanged. No evidence of biliary ductal dilation. Pancreas: Unremarkable. No pancreatic ductal dilatation or surrounding inflammatory changes. Spleen: Normal in size without focal abnormality. Adrenals/Urinary Tract: Nodular appearance of the left adrenal gland. Mild bilateral renal cortical thinning. No evidence of left nephrolithiasis hydronephrosis. Right ureteral stent with proximal end within the renal pelvis and distal end within the trigonal region of the urinary bladder. Mild right hydronephrosis and hydroureter.  Perinephric and periureteral stranding. Mucosal thickening of the base of the urinary bladder and proximal urethra. Stomach/Bowel: Stomach is within normal limits. No evidence of bowel wall thickening, distention, or inflammatory changes. Vascular/Lymphatic: Aortic atherosclerosis, minimal. No enlarged abdominal or pelvic lymph nodes by CT criteria. Shotty retroperitoneal lymph nodes are present. Reproductive: The prostate is not enlarged. Other: No abdominal wall hernia or abnormality. No abdominopelvic ascites. Musculoskeletal: No acute or significant osseous findings. IMPRESSION: 1. Mild right hydronephrosis and hydroureter with perinephric and periureteral stranding. Right ureteral stent appropriately positioned. 2. Mucosal thickening at the base of the urinary bladder and proximal urethra. 3. Cholelithiasis without evidence of cholecystitis. Aortic Atherosclerosis (ICD10-I70.0). Electronically Signed   By: Fidela Salisbury M.D.   On: 01/06/2019 17:01   Dg Chest Port 1 View  Result Date: 01/06/2019 CLINICAL DATA:  Weakness. EXAM: PORTABLE CHEST 1 VIEW COMPARISON:  09/25/2016 FINDINGS: Lungs are adequately inflated with moderate stable elevation of the left hemidiaphragm. Lungs are otherwise clear. Cardiomediastinal silhouette and remainder of the exam is unchanged. IMPRESSION: No acute findings. Stable elevation left hemidiaphragm. Electronically Signed   By: Marin Olp M.D.   On: 01/06/2019 13:54    Pending Labs Unresulted Labs (From admission, onward)    Start     Ordered   01/06/19 1320  Gastrointestinal Panel by PCR , Stool  (Gastrointestinal Panel by PCR, Stool)  Once,   STAT     01/06/19 1319   01/06/19 1319  C difficile quick scan w PCR reflex  (C Difficile quick screen w PCR reflex panel)  Once, for 24 hours,  STAT     01/06/19 1319   01/06/19 1314  Lactic acid, plasma  Now then every 2 hours,   STAT     01/06/19 1313   01/06/19 1314  Blood culture (routine x 2)  BLOOD CULTURE X  2,   STAT     01/06/19 1313   01/06/19 1314  Urine culture  ONCE - STAT,   STAT     01/06/19 1313   Signed and Held  Protime-INR  Tomorrow morning,   R     Signed and Held   Signed and Held  Cortisol-am, blood  Tomorrow morning,   R     Signed and Held   Signed and Held  Procalcitonin  Tomorrow morning,   R     Signed and Held   Visual merchandiser and Held  Urine culture  Once,   R     Signed and Held   Signed and Occupational hygienist morning,   R     Signed and Held   Signed and Held  CBC  Tomorrow morning,   R     Signed and Held          Vitals/Pain Today's Vitals   01/06/19 1800 01/06/19 1806 01/06/19 1838 01/06/19 1848  BP: (!) 94/55  (!) 95/53   Pulse:  98  95  Resp:      Temp:      TempSrc:      SpO2:  99%  99%  Weight:      PainSc:        Isolation Precautions Enteric precautions (UV disinfection)  Medications Medications  sodium chloride (PF) 0.9 % injection (has no administration in time range)  ceFEPIme (MAXIPIME) 2 g in sodium chloride 0.9 % 100 mL IVPB (has no administration in time range)  insulin aspart (novoLOG) injection 0-15 Units (has no administration in time range)  insulin aspart (novoLOG) injection 0-5 Units (has no administration in time range)  sodium chloride 0.9 % bolus 1,000 mL (0 mLs Intravenous Stopped 01/06/19 1733)  sodium chloride 0.9 % bolus 1,000 mL (0 mLs Intravenous Stopped 01/06/19 1733)  cefTRIAXone (ROCEPHIN) 2 g in sodium chloride 0.9 % 100 mL IVPB (0 g Intravenous Stopped 01/06/19 1733)  metroNIDAZOLE (FLAGYL) IVPB 500 mg (0 mg Intravenous Stopped 01/06/19 1733)  iohexol (OMNIPAQUE) 300 MG/ML solution 100 mL (100 mLs Intravenous Contrast Given 01/06/19 1553)    Mobility walks

## 2019-01-06 NOTE — Progress Notes (Signed)
Pharmacy Antibiotic Note  Zylan Almquist is a 78 y.o. male admitted on 01/06/2019 with UTI.  Pharmacy has been consulted for cefepime dosing.  Plan: Cefepime 2g IV q24 for UTI and current renal function  Weight: 181 lb (82.1 kg)  Temp (24hrs), Avg:98.2 F (36.8 C), Min:98.2 F (36.8 C), Max:98.2 F (36.8 C)  Recent Labs  Lab 01/06/19 1419  WBC 3.0*  CREATININE 1.29*  LATICACIDVEN 2.4*    Estimated Creatinine Clearance: 51.1 mL/min (A) (by C-G formula based on SCr of 1.29 mg/dL (H)).    Allergies  Allergen Reactions  . Sulfa Antibiotics Hives  . Tetracyclines & Related Rash  . Aricept [Donepezil Hcl] Nausea And Vomiting    Thank you for allowing pharmacy to be a part of this patient's care.  Kara Mead 01/06/2019 5:59 PM

## 2019-01-06 NOTE — ED Provider Notes (Signed)
[facility] DEPT Provider Note   CSN: [id number] Arrival date & time: 01/06/19  1222    History   Chief Complaint Chief Complaint  Patient presents with   Weakness    HPI Zane Pellecchia is a 78 y.o. male hx of dementia, diabetes, bladder cancer finished chemotherapy (last treatment was a week ago), here presenting with weakness, diarrhea, abdominal pain.  Patient has been having nausea and poor appetite for the last week or so.  He just refuses to eat for the last several days.  He also have 2-3 episodes with watery diarrhea a day for last several days.  He also has some lower abdominal cramps.  Wife did give him several doses of Imodium but states that did not work.  Patient does not have a history of C. difficile or any recent antibiotic use.  Per the wife, patient is demented but has been less responsive than usual and is been sleeping a lot.  Denies any fevers or chills.  She states that apparently their son recently had a possible COVID exposure at work but has not been around the patient.      The history is provided by the patient and the spouse.    Past Medical History:  Diagnosis Date   Alzheimer disease ([place])    Alzheimer's disease ([place])    Bilateral cold feet    Bladder cancer ([place])    dx in 2013   BPH (benign prostatic hypertrophy)    Dementia ([place]) 12-11-11   memory short term(steadily progressive worsening-pt. unaware) .dx. 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   moderate left hemidiaphragm elevation   Hard of hearing    Hearing loss    has one hearing aid, doesn't wear   History of chemotherapy    History of gallstones 2005   History of kidney stones    Hx of nonmelanoma skin cancer    Hypercholesterolemia    Hypertension 12-11-11   controlled with meds   Increased frequency of urination    wife reports urinary frequency has not improved since  last surgery    Pulmonary nodules    Bilateral   Renal calculus, left 10/12/2013   UTI (urinary tract infection) 08-09-13   multiple, last tx. 1 week ago    Patient Active Problem List   Diagnosis Date Noted   Cancer of lateral wall of urinary bladder ([place]) 03/10/2016   Renal calculus, left 10/12/2013   Memory loss 03/01/2013   Unspecified essential hypertension 03/01/2013   Type II or unspecified type diabetes mellitus without mention of complication, not stated as uncontrolled 03/01/2013    Past Surgical History:  Procedure Laterality Date   BLADDER SURGERY  12-11-11   2007-tumor removal -stent placed and removed./stent  '08 for stone   CATARACT EXTRACTION, BILATERAL     bil.  LEFT EYE 08-20-09    RT EYE 08-27-09   COLONOSCOPY     CYSTOSCOPY  05-16-2007   cystoscopy  10-09-2008   CYSTOSCOPY N/A 11/18/2015   Procedure: CYSTOSCOPY;  Surgeon: Raynelle Bring, MD;  Location: [facility];  Service: Urology;  Laterality: N/A;   CYSTOSCOPY N/A 10/19/2016   Procedure: CYSTOSCOPY;  Surgeon: Raynelle Bring, MD;  Location: [facility];  Service: Urology;  Laterality: N/A;   CYSTOSCOPY N/A 11/11/2017   Procedure: BLUE LIGHT CYSTOSCOPY WITH CYSVIEW;  Surgeon: Raynelle Bring, MD;  Location: [facility];  Service: Urology;  Laterality: N/A;  CYSTOSCOPY W/ RETROGRADES  12/21/2011   Procedure: CYSTOSCOPY WITH RETROGRADE PYELOGRAM;  Surgeon: [name], MD;  Location: [facility];  Service: Urology;  Laterality: Bilateral;   CYSTOSCOPY W/ RETROGRADES Bilateral 08/14/2013   Procedure: CYSTOSCOPY WITH BILATERAL  RETROGRADE PYELOGRAM/LEFT DIGITAL FLEXIBLE URETEROSCOPY/INSERTION LEFT URETERAL STENT;  Surgeon: [name], MD;  Location: [facility];  Service: Urology;  Laterality: Bilateral;   CYSTOSCOPY W/ RETROGRADES Bilateral 07/23/2014   Procedure: CYSTOSCOPY WITH RETROGRADE PYELOGRAM;  Surgeon: Raynelle Bring, MD;  [facility]: [facility];  Service: Urology;  Laterality: Bilateral;   CYSTOSCOPY W/ RETROGRADES Bilateral  05/13/2015   Procedure: CYSTOSCOPY WITH RETROGRADE PYELOGRAM;  Surgeon: Raynelle Bring, MD;  [facility];  Service: Urology;  Laterality: Bilateral;   CYSTOSCOPY W/ RETROGRADES Bilateral 03/02/2016   Procedure: Rolly Salter UNDER ANESTHESIA;  Surgeon: Raynelle Bring, MD;  [facility];  Service: Urology;  Laterality: Bilateral;   CYSTOSCOPY W/ RETROGRADES N/A 09/28/2016   Procedure: CYSTOSCOPY WITH RETROGRADE PYELOGRAM;  Surgeon: Raynelle Bring, MD;  [facility];  Service: Urology;  Laterality: N/A;   CYSTOSCOPY WITH BIOPSY  12/21/2011   Procedure: CYSTOSCOPY WITH BIOPSY;  Surgeon: [name], MD;  [facility];  Service: Urology;;   CYSTOSCOPY WITH BIOPSY N/A 08/29/2018   Procedure: CYSTOSCOPY WITH BIOPSY OF BLADDER TUMOR/ TRANSURETHRAL RESECTION OF BLADDER TUMOR/ RIGHT RETROGRADE PYELOGRAM/RIGHT STENT PLACEMENT;  Surgeon: Raynelle Bring, MD;  [facility];  Service: Urology;  Laterality: N/A;   EXTRACORPOREAL SHOCK WAVE LITHOTRIPSY  2005   EYE SURGERY     LITHOTRIPSY  12-11-11   '05/ '10   mose procedure  Left 11-12-15 and feb 2017   NEPHROLITHOTOMY Left 10/12/2013   Procedure: NEPHROLITHOTOMY PERCUTANEOUS;  Surgeon: [name], MD;  [facility];  Service: Urology;  Laterality: Left;   stent to kidney  04-29-2007   SUBDURAL HEMORRHAGE DRAINAGE     drainage per boreholes only 12-12-03   TONSILLECTOMY  12-11-11   child   TRANSURETHRAL RESECTION OF BLADDER TUMOR  12/21/2011   Procedure: TRANSURETHRAL RESECTION OF BLADDER TUMOR (TURBT);  Surgeon: [name], MD;  [facility];  Service: Urology;  Laterality: N/A;      TRANSURETHRAL RESECTION OF BLADDER TUMOR N/A 08/14/2013   Procedure: TRANSURETHRAL RESECTION OF BLADDER TUMOR (TURBT);  Surgeon: [name], MD;  [facility];  Service: Urology;  Laterality: N/A;   TRANSURETHRAL RESECTION OF BLADDER TUMOR N/A 07/23/2014   Procedure: TRANSURETHRAL RESECTION OF BLADDER TUMOR (TURBT) WITH MITOMYCIN  INSTILLATION;  Surgeon: Raynelle Bring, MD;  [facility];  Service: Urology;  Laterality: N/A;   TRANSURETHRAL RESECTION OF BLADDER TUMOR N/A 05/13/2015   Procedure: TRANSURETHRAL RESECTION OF BLADDER TUMOR (TURBT);  Surgeon: Raynelle Bring, MD;  [facility];  Service: Urology;  Laterality: N/A;   TRANSURETHRAL RESECTION OF BLADDER TUMOR N/A 11/18/2015   Procedure: TRANSURETHRAL RESECTION OF BLADDER TUMOR (TURBT);  Surgeon: Raynelle Bring, MD;  [facility];  Service: Urology;  Laterality: N/A;   TRANSURETHRAL RESECTION OF BLADDER TUMOR N/A 03/02/2016   Procedure: TRANSURETHRAL RESECTION OF BLADDER TUMOR (TURBT);  Surgeon: Raynelle Bring, MD;  [facility];  Service: Urology;  Laterality: N/A;   TRANSURETHRAL RESECTION OF BLADDER TUMOR N/A 09/28/2016   Procedure: TRANSURETHRAL RESECTION OF BLADDER TUMOR (TURBT);  Surgeon: Raynelle Bring, MD;  [facility];  Service: Urology;  Laterality: N/A;   TRANSURETHRAL RESECTION OF BLADDER TUMOR N/A 10/19/2016   Procedure: TRANSURETHRAL RESECTION OF BLADDER TUMOR (TURBT);  Surgeon: Raynelle Bring, MD;  [facility];  Service: Urology;  Laterality: N/A;  NEEDS 30 MIN TOTAL FOR BOTH PROCEDURES   TRANSURETHRAL RESECTION OF BLADDER TUMOR N/A 11/11/2017   Procedure: TRANSURETHRAL RESECTION OF BLADDER TUMOR (TURBT) WITH CYSTOSCOPY;  Surgeon: Raynelle Bring, MD;  [facility];  Service: Urology;  Laterality: N/A;   TRANSURETHRAL RESECTION OF BLADDER TUMOR N/A 10/17/2018   Procedure: TRANSURETHRAL RESECTION OF BLADDER TUMOR (TURBT) WITH CYSTOSCOPY, RIGHT URETEROSCOPY, RIGHT URETERAL STENT CHANGE;  Surgeon: Raynelle Bring, MD;  [facility];  Service: Urology;  Laterality: N/A;  GENERAL ANESTHESIA WITH POSSIBLE PARALYSIS   UPPER GI ENDOSCOPY          Home Medications    Prior to Admission medications   Medication Sig Start Date End Date Taking? Authorizing Provider  dutasteride (AVODART) 0.5 MG capsule Take 0.5 mg by mouth daily.     Yes [provider]  glipiZIDE (GLUCOTROL) 5 MG tablet Take 2.5 mg by mouth daily before breakfast.    Yes [provider]  hydrochlorothiazide (HYDRODIURIL) 25 MG tablet Take 25 mg by mouth daily.    Yes [provider]  memantine (NAMENDA XR) 28 MG CP24 24 hr capsule Take 28 mg by mouth every morning.    Yes [provider]  metFORMIN (GLUCOPHAGE) 500 MG tablet Take 1,000 mg by mouth 2 (two) times daily with a meal.   Yes [provider]  metoprolol succinate (TOPROL-XL) 25 MG 24 hr tablet Take 25 mg by mouth daily.  01/28/13  Yes [provider]  Multiple Vitamin (MULTIVITAMIN WITH MINERALS) TABS tablet Take 1 tablet by mouth every evening.   Yes [provider]  Omega-3 Fatty Acids (FISH OIL) 1200 MG CAPS Take 1,200 mg by mouth every evening.   Yes [provider]  perindopril (ACEON) 4 MG tablet Take 4 mg by mouth daily.    Yes [provider]  saxagliptin HCl (ONGLYZA) 5 MG TABS tablet Take 5 mg by mouth daily.   Yes [provider]  vitamin B-12 (CYANOCOBALAMIN) 500 MCG tablet Take 500 mcg by mouth every evening.   Yes [provider]  [facility] DELICA LANCETS 18A MISC 2 (two) times daily. as directed 02/17/18   [provider]  prochlorperazine (COMPAZINE) 10 MG tablet Take 1 tablet (10 mg total) by mouth every 6 (six) hours as needed for nausea or vomiting. Patient not taking: Reported on 01/06/2019 10/27/18   Wyatt Portela, MD    Family History Family History  Problem Relation Age of Onset   Breast cancer Sister    Colon cancer Brother 48    Social History Social History   Tobacco Use   Smoking status: Never Smoker   Smokeless tobacco: Never Used  Substance Use Topics   Alcohol use: No   Drug use: No     Allergies   Sulfa antibiotics, Tetracyclines & related, and Aricept [donepezil hcl]   Review of Systems Review of Systems  Gastrointestinal: Positive for  diarrhea and nausea.  Neurological: Positive for weakness.  All other systems reviewed and are negative.    Physical Exam Updated Vital Signs BP 127/72    Pulse 100    Temp 98.2 F (36.8 C) (Oral)    Resp 19    SpO2 100%   Physical Exam Vitals signs and nursing note reviewed.  Constitutional:      Comments: Chronically ill, dehydrated   HENT:     Head: Normocephalic.     Nose: Nose normal.     Mouth/Throat:  Mouth: Mucous membranes are dry.     Comments: MM dry  Eyes:     Extraocular Movements: Extraocular movements intact.     Pupils: Pupils are equal, round, and reactive to light.  Neck:     Musculoskeletal: Normal range of motion.  Cardiovascular:     Rate and Rhythm: Normal rate.     Pulses: Normal pulses.  Pulmonary:     Effort: Pulmonary effort is normal.     Breath sounds: Normal breath sounds.  Abdominal:     General: Abdomen is flat.     Palpations: Abdomen is soft.     Comments: Mild diffuse lower abdominal tenderness, no rebound   Musculoskeletal: Normal range of motion.  Skin:    General: Skin is warm.     Capillary Refill: Capillary refill takes less than 2 seconds.  Neurological:     General: No focal deficit present.     Mental Status: He is alert and oriented to person, place, and time.  Psychiatric:        Mood and Affect: Mood normal.        Behavior: Behavior normal.      ED Treatments / Results  Labs (all labs ordered are listed, but only abnormal results are displayed) Labs Reviewed  CBC WITH DIFFERENTIAL/PLATELET - Abnormal; Notable for the following components:      Result Value   WBC 3.0 (*)    RBC 3.14 (*)    Hemoglobin 9.6 (*)    HCT 27.4 (*)    RDW 20.2 (*)    nRBC 0.7 (*)    Lymphs Abs 0.3 (*)    All other components within normal limits  COMPREHENSIVE METABOLIC PANEL - Abnormal; Notable for the following components:   Potassium 3.2 (*)    Chloride 97 (*)    Glucose, Bld 116 (*)    BUN 51 (*)    Creatinine, Ser 1.29  (*)    Total Bilirubin 1.6 (*)    GFR calc non Af Amer 54 (*)    All other components within normal limits  LACTIC ACID, PLASMA - Abnormal; Notable for the following components:   Lactic Acid, Venous 2.4 (*)    All other components within normal limits  URINALYSIS, ROUTINE W REFLEX MICROSCOPIC - Abnormal; Notable for the following components:   Color, Urine AMBER (*)    APPearance CLOUDY (*)    Glucose, UA 50 (*)    Hgb urine dipstick MODERATE (*)    Ketones, ur 5 (*)    Protein, ur 100 (*)    Nitrite POSITIVE (*)    Leukocytes,Ua LARGE (*)    RBC / HPF >50 (*)    WBC, UA >50 (*)    All other components within normal limits  SARS CORONAVIRUS 2 (HOSPITAL ORDER, [place] [facility])  CULTURE, BLOOD (ROUTINE X 2)  CULTURE, BLOOD (ROUTINE X 2)  URINE CULTURE  C DIFFICILE QUICK SCREEN W PCR REFLEX  GASTROINTESTINAL PANEL BY PCR, STOOL (REPLACES STOOL CULTURE)  LACTIC ACID, PLASMA    EKG None  Radiology Ct Head Wo Contrast  Result Date: 01/06/2019 CLINICAL DATA:  Altered mental status.  History of dementia. EXAM: CT HEAD WITHOUT CONTRAST TECHNIQUE: Contiguous axial images were obtained from the base of the skull through the vertex without intravenous contrast. COMPARISON:  Brain MRI, 08/08/2012 FINDINGS: Brain: No evidence of acute infarction, hemorrhage, hydrocephalus, extra-axial collection or mass lesion/mass effect. There is mild ventricular enlargement with more prominent sulcal enlargement  reflecting generalized atrophy. Mild periventricular white matter hypoattenuation is noted consistent with chronic microvascular ischemic change. Vascular: No hyperdense vessel or unexpected calcification. Skull: No skull fracture.  68 old burr holes are noted on the left. Sinuses/Orbits: Globes and orbits are unremarkable. Sinuses and mastoid air cells are clear. Other: None. IMPRESSION: 1. No acute intracranial abnormalities. 2. Atrophy and mild chronic microvascular  ischemic change. Electronically Signed   By: Lajean Manes M.D.   On: 01/06/2019 16:48   Ct Abdomen Pelvis W Contrast  Result Date: 01/06/2019 CLINICAL DATA:  Patient with history of bladder cancer, who completed chemotherapy on 07/14 and radiation therapy on 07/17. Weakness, weight loss, change in bowel habits. EXAM: CT ABDOMEN AND PELVIS WITH CONTRAST TECHNIQUE: Multidetector CT imaging of the abdomen and pelvis was performed using the standard protocol following bolus administration of intravenous contrast. CONTRAST:  178mL OMNIPAQUE IOHEXOL 300 MG/ML  SOLN COMPARISON:  September 21, 2018 FINDINGS: Lower chest: Chronic elevation of the left hemidiaphragm. Atelectatic changes in the left lung base. Hepatobiliary: Normal appearance of the liver. Large gallbladder calculus, unchanged. No evidence of biliary ductal dilation. Pancreas: Unremarkable. No pancreatic ductal dilatation or surrounding inflammatory changes. Spleen: Normal in size without focal abnormality. Adrenals/Urinary Tract: Nodular appearance of the left adrenal gland. Mild bilateral renal cortical thinning. No evidence of left nephrolithiasis hydronephrosis. Right ureteral stent with proximal end within the renal pelvis and distal end within the trigonal region of the urinary bladder. Mild right hydronephrosis and hydroureter. Perinephric and periureteral stranding. Mucosal thickening of the base of the urinary bladder and proximal urethra. Stomach/Bowel: Stomach is within normal limits. No evidence of bowel wall thickening, distention, or inflammatory changes. Vascular/Lymphatic: Aortic atherosclerosis, minimal. No enlarged abdominal or pelvic lymph nodes by CT criteria. Shotty retroperitoneal lymph nodes are present. Reproductive: The prostate is not enlarged. Other: No abdominal wall hernia or abnormality. No abdominopelvic ascites. Musculoskeletal: No acute or significant osseous findings. IMPRESSION: 1. Mild right hydronephrosis and hydroureter  with perinephric and periureteral stranding. Right ureteral stent appropriately positioned. 2. Mucosal thickening at the base of the urinary bladder and proximal urethra. 3. Cholelithiasis without evidence of cholecystitis. Aortic Atherosclerosis (ICD10-I70.0). Electronically Signed   By: Fidela Salisbury M.D.   On: 01/06/2019 17:01   Dg Chest Port 1 View  Result Date: 01/06/2019 CLINICAL DATA:  Weakness. EXAM: PORTABLE CHEST 1 VIEW COMPARISON:  09/25/2016 FINDINGS: Lungs are adequately inflated with moderate stable elevation of the left hemidiaphragm. Lungs are otherwise clear. Cardiomediastinal silhouette and remainder of the exam is unchanged. IMPRESSION: No acute findings. Stable elevation left hemidiaphragm. Electronically Signed   By: Marin Olp M.D.   On: 01/06/2019 13:54    Procedures Procedures (including critical care time)  CRITICAL CARE Performed by: Wandra Arthurs   Total critical care time: 30 minutes  Critical care time was exclusive of separately billable procedures and treating other patients.  Critical care was necessary to treat or prevent imminent or life-threatening deterioration.  Critical care was time spent personally by me on the following activities: development of treatment plan with patient and/or surrogate as well as nursing, discussions with consultants, evaluation of patient's response to treatment, examination of patient, obtaining history from patient or surrogate, ordering and performing treatments and interventions, ordering and review of laboratory studies, ordering and review of radiographic studies, pulse oximetry and re-evaluation of patient's condition.   Medications Ordered in ED Medications  sodium chloride (PF) 0.9 % injection (has no administration in time range)  sodium chloride 0.9 %  bolus 1,000 mL (1,000 mLs Intravenous New Bag/Given 01/06/19 1427)  sodium chloride 0.9 % bolus 1,000 mL (1,000 mLs Intravenous New Bag/Given 01/06/19 1531)    cefTRIAXone (ROCEPHIN) 2 g in sodium chloride 0.9 % 100 mL IVPB (2 g Intravenous New Bag/Given 01/06/19 1530)  metroNIDAZOLE (FLAGYL) IVPB 500 mg (500 mg Intravenous New Bag/Given 01/06/19 1617)  iohexol (OMNIPAQUE) 300 MG/ML solution 100 mL (100 mLs Intravenous Contrast Given 01/06/19 1553)     Initial Impression / Assessment and Plan / ED Course  I have reviewed the triage vital signs and the nursing notes.  Pertinent labs & imaging results that were available during my care of the patient were reviewed by me and considered in my medical decision making (see chart for details).       Rhea Thrun Stroot is a 78 y.o. male here presenting with decreased appetite, abdominal pain.  Patient recently had chemo for his bladder cancer had previous radiation as well.  Wonder if he has radiation colitis versus C. Difficile.  Patient is tachycardic and hypotensive.  Will hydrate patient and get CT abdomen pelvis.  5:23 PM Lactate elevated at 2.4.  CT showed right pyelonephritis. UA showed nitrate and leuks. Will admit for sepsis from UTI, pyelo. Patient was hypotensive in the 80s briefly. BP up to 120s after IV bolus. Hospitalist to admit.    Final Clinical Impressions(s) / ED Diagnoses   Final diagnoses:  None    ED Discharge Orders    None       Drenda Freeze, MD 01/06/19 1724

## 2019-01-06 NOTE — Telephone Encounter (Signed)
Received call from patient's wife, [name], who is very upset and crying. She request "help." She explains her son who lives next door has been exposed to the Covid virus and unable to help her. She explains her husband "isn't doing well" and she doesn't know what to do. Patient with a history of bladder cancer. Patient completed chemotherapy on 7/14 and radiation therapy on 7/17. Also, this patient has severe dementia. She reports since 7/17 her husband has lost 28 lb. She states, "he refuses to eat or drink anything even though he says he is hungry." She explains he is defecating and urinating almost every half hour. She endorses giving him Imodium but is afraid to give him too much since she doesn't want him to have constipation. She reports her husband complains often of abdominal pain. She denies he complains of headache, dizziness, nausea or other issues. She reports he is to weak to "even walk down our hallway." Instructed her to call 911 and have her husband taken to the hospital for further evaluation. She verbalized understanding. She states, "I am scared and we are so isolated."  Attempted to reassure and calm [name]. Will inform Dr. Tammi Klippel and Dr. Alen Blew of these findings.

## 2019-01-07 LAB — URINALYSIS, ROUTINE W REFLEX MICROSCOPIC
Bacteria, UA: NONE SEEN
Bilirubin Urine: NEGATIVE
Glucose, UA: NEGATIVE mg/dL
Ketones, ur: 5 mg/dL — AB
Nitrite: NEGATIVE
Protein, ur: 100 mg/dL — AB
Specific Gravity, Urine: 1.025 (ref 1.005–1.030)
WBC, UA: >50 WBC/hpf — ABNORMAL HIGH (ref 0–5)
pH: 5 (ref 5.0–8.0)

## 2019-01-07 LAB — GLUCOSE, CAPILLARY
Glucose-Capillary: 106 mg/dL — ABNORMAL HIGH (ref 70–99)
Glucose-Capillary: 147 mg/dL — ABNORMAL HIGH (ref 70–99)
Glucose-Capillary: 122 mg/dL — ABNORMAL HIGH (ref 70–99)
Glucose-Capillary: 101 mg/dL — ABNORMAL HIGH (ref 70–99)
Glucose-Capillary: 178 mg/dL — ABNORMAL HIGH (ref 70–99)

## 2019-01-07 LAB — COMPREHENSIVE METABOLIC PANEL
ALT: 21 U/L (ref 0–44)
AST: 22 U/L (ref 15–41)
Albumin: 3.3 g/dL — ABNORMAL LOW (ref 3.5–5.0)
Alkaline Phosphatase: 54 U/L (ref 38–126)
Anion gap: 10 (ref 5–15)
BUN: 38 mg/dL — ABNORMAL HIGH (ref 8–23)
CO2: 23 mmol/L (ref 22–32)
Calcium: 7.9 mg/dL — ABNORMAL LOW (ref 8.9–10.3)
Chloride: 104 mmol/L (ref 98–111)
Creatinine, Ser: 1.03 mg/dL (ref 0.61–1.24)
GFR calc Af Amer: >60 mL/min (ref >60)
GFR calc non Af Amer: >60 mL/min (ref >60)
Glucose, Bld: 139 mg/dL — ABNORMAL HIGH (ref 70–99)
Potassium: 3.1 mmol/L — ABNORMAL LOW (ref 3.5–5.1)
Sodium: 137 mmol/L (ref 135–145)
Total Bilirubin: 1.4 mg/dL — ABNORMAL HIGH (ref 0.3–1.2)
Total Protein: 5.9 g/dL — ABNORMAL LOW (ref 6.5–8.1)

## 2019-01-07 LAB — CBC
HCT: 24.9 % — ABNORMAL LOW (ref 39.0–52.0)
Hemoglobin: 8.4 g/dL — ABNORMAL LOW (ref 13.0–17.0)
MCH: 30.3 pg (ref 26.0–34.0)
MCHC: 33.7 g/dL (ref 30.0–36.0)
MCV: 89.9 fL (ref 80.0–100.0)
Platelets: 126 10*3/uL — ABNORMAL LOW (ref 150–400)
RBC: 2.77 MIL/uL — ABNORMAL LOW (ref 4.22–5.81)
RDW: 20.4 % — ABNORMAL HIGH (ref 11.5–15.5)
WBC: 2.6 10*3/uL — ABNORMAL LOW (ref 4.0–10.5)
nRBC: 0.8 % — ABNORMAL HIGH (ref 0.0–0.2)

## 2019-01-07 LAB — PROCALCITONIN: Procalcitonin: <0.1 ng/mL

## 2019-01-07 LAB — PROTIME-INR
INR: 1.2 (ref 0.8–1.2)
Prothrombin Time: 15.1 seconds (ref 11.4–15.2)

## 2019-01-07 LAB — CORTISOL-AM, BLOOD: Cortisol - AM: 14.8 ug/dL (ref 6.7–22.6)

## 2019-01-07 MED ORDER — CHLORHEXIDINE GLUCONATE CLOTH 2 % EX PADS
6.0000 | MEDICATED_PAD | Freq: Every day | CUTANEOUS | Status: DC
  Administered 2019-01-07 – 2019-01-08 (×2): 6 via TOPICAL

## 2019-01-07 MED ORDER — TAMSULOSIN HCL 0.4 MG PO CAPS
0.4000 mg | ORAL_CAPSULE | Freq: Every day | ORAL | Status: DC
  Administered 2019-01-07 – 2019-01-10 (×4): 0.4 mg via ORAL
  Filled 2019-01-07 (×4): qty 1

## 2019-01-07 MED ORDER — POTASSIUM CHLORIDE CRYS ER 20 MEQ PO TBCR
40.0000 meq | EXTENDED_RELEASE_TABLET | Freq: Once | ORAL | Status: AC
  Administered 2019-01-07: 40 meq via ORAL
  Filled 2019-01-07: qty 2

## 2019-01-07 MED ORDER — POTASSIUM CHLORIDE 10 MEQ/100ML IV SOLN
10.0000 meq | INTRAVENOUS | Status: AC
  Administered 2019-01-07 (×4): 10 meq via INTRAVENOUS
  Filled 2019-01-07 (×4): qty 100

## 2019-01-07 MED ORDER — SODIUM CHLORIDE 0.9 % IV SOLN
2.0000 g | Freq: Two times a day (BID) | INTRAVENOUS | Status: DC
  Administered 2019-01-07 – 2019-01-09 (×6): 2 g via INTRAVENOUS
  Filled 2019-01-07 (×9): qty 2

## 2019-01-07 MED ORDER — PHENAZOPYRIDINE HCL 200 MG PO TABS
200.0000 mg | ORAL_TABLET | Freq: Once | ORAL | Status: AC
  Administered 2019-01-07: 200 mg via ORAL
  Filled 2019-01-07: qty 1

## 2019-01-07 NOTE — Progress Notes (Signed)
PROGRESS NOTE    [name] [name]  DGU:[id number] DOB: 1941-02-16 DOA: 01/06/2019 PCP: [name], MD   Brief Narrative:  HPI: [name] [name] is a 78 y.o. male with medical history significant for bladder cancer status post chemo on carboplatin most recent treatment July 14 as well as radiation 35 treatments most recent treatment July 17 who presents from home with failure to thrive, diarrhea anorexia with approximately 30 pound weight loss.  Patient's past no history also includes non-insulin-dependent diabetes, hypertension, hyperlipidemia, history of subdural hematoma and multiple skin cancer status post excision.  Wife reports he has had a progressive decline over the last 1 to 2 weeks with significant decreased p.o. intake over the same time..  Patient's had bowel and bladder incontinence.  This been ongoing since stent placement in March for urinary incontinence with bladder cancer.  He also reports abdominal pain which is been difficult to characterize secondary patient's profound dementia.  Wife reports that he points to his general stomach area without a focal finding unable to verbalize more specific findings.  Wife reports he is also been too weak to walk in the hallways with what appears to be a progressive decline given his chemo radiation bladder cancer and decreased p.o. intake.  ED Course: In the ER lab patient's blood pressure noted to be low consistent with sepsis.  He received 2 L of normal saline with an appropriate response, white count is 3 although in the setting of recent chemotherapy with 68% neutrophils hemoglobin 9.6 BMP notable for mildly low potassium at 3.2 CO2 was 27 BUN and creatinine 51 and 1.3.  Lactic acid was two-point 4 repeat pending.  Patient was COVID negative UA is notable for large leukocytes and positive nitrites specific gravity 1.016, urine culture ordered, blood cultures ordered but appear not to be collected unfortunately antibiotics averted  been administered in the ER.  CT performed showing mild right hydro-with right ureteral stent cholelithiasis without evidence of cholecystitis and mucosal thickening of the base of urinary bladder and proximal urethra in his known bladder cancer patient   Assessment & Plan:   Principal Problem:   Severe sepsis ([place]) Active Problems:   Essential hypertension   Type 2 diabetes mellitus without complication ([place])   Acute lower UTI   Dementia without behavioral disturbance (HCC)   Sepsis ([place])   History of subdural hematoma   Severe sepsis.  Patient was admitted under sepsis protocol, repeat lactic acid was 1.4, he received appropriate fluid resuscitation, will continue with normal saline 125 mils per hour patient's blood pressure responded appropriately.  cont to treat the patient with cefepime, follow urine culture, blood cultures which are pending, monitor CBC in the morning,  Urinary tract infection in the setting of bladder cancer.  Treating with antibiotics as noted above follow urine culture as above, patient with known bladder cancer with bladder stent placed in March 2020.  Hypertension: Patient was actually hypotensive again hold blood pressure medications fluid resuscitate.as above  Non-insulin-dependent diabetes.  I will hold patient's home medications, placed on sliding scale insulin ADA diet check blood glucose AC at bedtime.  Dementia without behavioral disturbance.  Patient stable continue with home meds no change  DVT prophylaxis: SCD/Compression stockings  Code Status: DNR    Code Status Orders  (From admission, onward)         Start     Ordered   01/06/19 2005  Do not attempt resuscitation (DNR)  Continuous    Question Answer Comment  In  the event of cardiac or respiratory ARREST Do not call a code blue   In the event of cardiac or respiratory ARREST Do not perform Intubation, CPR, defibrillation or ACLS   In the event of cardiac or respiratory ARREST Use  medication by any route, position, wound care, and other measures to relive pain and suffering. May use oxygen, suction and manual treatment of airway obstruction as needed for comfort.      01/06/19 2004        Code Status History    Date Active Date Inactive Code Status Order ID Comments User Context   10/12/2013 1600 10/13/2013 1502 Full Code [id number]  Raynelle Bring, MD Inpatient   Advance Care Planning Activity     Family Communication: Wife by phone today Disposition Plan:   Patient will remain inpatient in  the stepdown unit.  He will need continuous IV fluids, IV antibiotics, frequent lab checks, frequent nurse care in the setting of urinary tract infection with severe sepsis.  Without these treatments in my opinion patient at risk of severe clinical deterioration which could be life-threatening  Consults called: None Admission status: Inpatient   Consultants:   None  Procedures:  Ct Head Wo Contrast  Result Date: 01/06/2019 CLINICAL DATA:  Altered mental status.  History of dementia. EXAM: CT HEAD WITHOUT CONTRAST TECHNIQUE: Contiguous axial images were obtained from the base of the skull through the vertex without intravenous contrast. COMPARISON:  Brain MRI, 08/08/2012 FINDINGS: Brain: No evidence of acute infarction, hemorrhage, hydrocephalus, extra-axial collection or mass lesion/mass effect. There is mild ventricular enlargement with more prominent sulcal enlargement reflecting generalized atrophy. Mild periventricular white matter hypoattenuation is noted consistent with chronic microvascular ischemic change. Vascular: No hyperdense vessel or unexpected calcification. Skull: No skull fracture.  94 old burr holes are noted on the left. Sinuses/Orbits: Globes and orbits are unremarkable. Sinuses and mastoid air cells are clear. Other: None. IMPRESSION: 1. No acute intracranial abnormalities. 2. Atrophy and mild chronic microvascular ischemic change. Electronically Signed   By:  Lajean Manes M.D.   On: 01/06/2019 16:48   Ct Abdomen Pelvis W Contrast  Result Date: 01/06/2019 CLINICAL DATA:  Patient with history of bladder cancer, who completed chemotherapy on 07/14 and radiation therapy on 07/17. Weakness, weight loss, change in bowel habits. EXAM: CT ABDOMEN AND PELVIS WITH CONTRAST TECHNIQUE: Multidetector CT imaging of the abdomen and pelvis was performed using the standard protocol following bolus administration of intravenous contrast. CONTRAST:  145mL OMNIPAQUE IOHEXOL 300 MG/ML  SOLN COMPARISON:  September 21, 2018 FINDINGS: Lower chest: Chronic elevation of the left hemidiaphragm. Atelectatic changes in the left lung base. Hepatobiliary: Normal appearance of the liver. Large gallbladder calculus, unchanged. No evidence of biliary ductal dilation. Pancreas: Unremarkable. No pancreatic ductal dilatation or surrounding inflammatory changes. Spleen: Normal in size without focal abnormality. Adrenals/Urinary Tract: Nodular appearance of the left adrenal gland. Mild bilateral renal cortical thinning. No evidence of left nephrolithiasis hydronephrosis. Right ureteral stent with proximal end within the renal pelvis and distal end within the trigonal region of the urinary bladder. Mild right hydronephrosis and hydroureter. Perinephric and periureteral stranding. Mucosal thickening of the base of the urinary bladder and proximal urethra. Stomach/Bowel: Stomach is within normal limits. No evidence of bowel wall thickening, distention, or inflammatory changes. Vascular/Lymphatic: Aortic atherosclerosis, minimal. No enlarged abdominal or pelvic lymph nodes by CT criteria. Shotty retroperitoneal lymph nodes are present. Reproductive: The prostate is not enlarged. Other: No abdominal wall hernia or abnormality. No abdominopelvic ascites. Musculoskeletal:  No acute or significant osseous findings. IMPRESSION: 1. Mild right hydronephrosis and hydroureter with perinephric and periureteral stranding.  Right ureteral stent appropriately positioned. 2. Mucosal thickening at the base of the urinary bladder and proximal urethra. 3. Cholelithiasis without evidence of cholecystitis. Aortic Atherosclerosis (ICD10-I70.0). Electronically Signed   By: Fidela Salisbury M.D.   On: 01/06/2019 17:01   Dg Chest Port 1 View  Result Date: 01/06/2019 CLINICAL DATA:  Weakness. EXAM: PORTABLE CHEST 1 VIEW COMPARISON:  09/25/2016 FINDINGS: Lungs are adequately inflated with moderate stable elevation of the left hemidiaphragm. Lungs are otherwise clear. Cardiomediastinal silhouette and remainder of the exam is unchanged. IMPRESSION: No acute findings. Stable elevation left hemidiaphragm. Electronically Signed   By: Marin Olp M.D.   On: 01/06/2019 13:54     Antimicrobials:   Cefepime day #2   Subjective: Patient did well overnight no acute decompensation Blood pressure is improving  Objective: Vitals:   01/07/19 0400 01/07/19 0500 01/07/19 0700 01/07/19 0736  BP: 114/70 (!) 93/51 (!) 113/56   Pulse: 85 81 81   Resp: 20 16 17    Temp:    97.7 F (36.5 C)  TempSrc:    Oral  SpO2: 100% 100% 100%   Weight:      Height:        Intake/Output Summary (Last 24 hours) at 01/07/2019 1045 Last data filed at 01/07/2019 0600 Gross per 24 hour  Intake 3464.19 ml  Output 850 ml  Net 2614.19 ml   Filed Weights   01/06/19 1700 01/06/19 2004  Weight: 82.1 kg 75.2 kg    Examination:  General exam: Appears calm and comfortable, baseline confusion with known chronic dementia Respiratory system: Clear to auscultation. Respiratory effort normal. Cardiovascular system: S1 & S2 heard, RRR. No JVD, murmurs, rubs, gallops or clicks. No pedal edema. Gastrointestinal system: Abdomen is nondistended, soft and nontender. No organomegaly or masses felt. Normal bowel sounds heard. Central nervous system: Alert,. No focal neurological deficits. Extremities: Symmetric 5 x 5 strength, warm well perfused Skin: No  rashes, lesions or ulcers Psychiatry: Judgement and insight impaired with chronic dementia. Mood & affect appropriate given known deficits    Data Reviewed: I have personally reviewed following labs and imaging studies  CBC: Recent Labs  Lab 01/06/19 1419 01/07/19 0017  WBC 3.0* 2.6*  NEUTROABS 2.0  --   HGB 9.6* 8.4*  HCT 27.4* 24.9*  MCV 87.3 89.9  PLT 150 962*   Basic Metabolic Panel: Recent Labs  Lab 01/06/19 1419 01/07/19 0017  NA 136 137  K 3.2* 3.1*  CL 97* 104  CO2 27 23  GLUCOSE 116* 139*  BUN 51* 38*  CREATININE 1.29* 1.03  CALCIUM 9.0 7.9*   GFR: Estimated Creatinine Clearance: 65.9 mL/min (by C-G formula based on SCr of 1.03 mg/dL). Liver Function Tests: Recent Labs  Lab 01/06/19 1419 01/07/19 0017  AST 24 22  ALT 24 21  ALKPHOS 61 54  BILITOT 1.6* 1.4*  PROT 6.9 5.9*  ALBUMIN 3.9 3.3*   No results for input(s): LIPASE, AMYLASE in the last 168 hours. No results for input(s): AMMONIA in the last 168 hours. Coagulation Profile: Recent Labs  Lab 01/07/19 0017  INR 1.2   Cardiac Enzymes: No results for input(s): CKTOTAL, CKMB, CKMBINDEX, TROPONINI in the last 168 hours. BNP (last 3 results) No results for input(s): PROBNP in the last 8760 hours. HbA1C: No results for input(s): HGBA1C in the last 72 hours. CBG: Recent Labs  Lab 01/06/19 2128 01/07/19 2297  GLUCAP 106* 147*   Lipid Profile: No results for input(s): CHOL, HDL, LDLCALC, TRIG, CHOLHDL, LDLDIRECT in the last 72 hours. Thyroid Function Tests: No results for input(s): TSH, T4TOTAL, FREET4, T3FREE, THYROIDAB in the last 72 hours. Anemia Panel: No results for input(s): VITAMINB12, FOLATE, FERRITIN, TIBC, IRON, RETICCTPCT in the last 72 hours. Sepsis Labs: Recent Labs  Lab 01/06/19 1419 01/06/19 1858 01/07/19 0017  PROCALCITON  --   --  <0.10  LATICACIDVEN 2.4* 1.4  --     Recent Results (from the past 240 hour(s))  SARS Coronavirus 2 (CEPHEID - Performed in [place] hospital lab), Hosp Order     Status: None   Collection Time: 01/06/19  2:19 PM   Specimen: Nasopharyngeal Swab  Result Value Ref Range Status   SARS Coronavirus 2 NEGATIVE NEGATIVE Final    Comment: (NOTE) If result is NEGATIVE SARS-CoV-2 target nucleic acids are NOT DETECTED. The SARS-CoV-2 RNA is generally detectable in upper and lower  respiratory specimens during the acute phase of infection. The lowest  concentration of SARS-CoV-2 viral copies this assay can detect is 250  copies / mL. A negative result does not preclude SARS-CoV-2 infection  and should not be used as the sole basis for treatment or other  patient management decisions.  A negative result may occur with  improper specimen collection / handling, submission of specimen other  than nasopharyngeal swab, presence of viral mutation(s) within the  areas targeted by this assay, and inadequate number of viral copies  (<250 copies / mL). A negative result must be combined with clinical  observations, patient history, and epidemiological information. If result is POSITIVE SARS-CoV-2 target nucleic acids are DETECTED. The SARS-CoV-2 RNA is generally detectable in upper and lower  respiratory specimens dur ing the acute phase of infection.  Positive  results are indicative of active infection with SARS-CoV-2.  Clinical  correlation with patient history and other diagnostic information is  necessary to determine patient infection status.  Positive results do  not rule out bacterial infection or co-infection with other viruses. If result is PRESUMPTIVE POSTIVE SARS-CoV-2 nucleic acids MAY BE PRESENT.   A presumptive positive result was obtained on the submitted specimen  and confirmed on repeat testing.  While 2019 novel coronavirus  (SARS-CoV-2) nucleic acids may be present in the submitted sample  additional confirmatory testing may be necessary for epidemiological  and / or clinical management purposes  to  differentiate between  SARS-CoV-2 and other Sarbecovirus currently known to infect humans.  If clinically indicated additional testing with an alternate test  methodology [phone]) is advised. The SARS-CoV-2 RNA is generally  detectable in upper and lower respiratory sp ecimens during the acute  phase of infection. The expected result is Negative. Fact Sheet for Patients:  StrictlyIdeas.no Fact Sheet for Healthcare Providers: BankingDealers.co.za This test is not yet approved or cleared by the [place] FDA and has been authorized for detection and/or diagnosis of SARS-CoV-2 by FDA under an Emergency Use Authorization (EUA).  This EUA will remain in effect (meaning this test can be used) for the duration of the COVID-19 declaration under Section 564(b)(1) of the Act, 21 U.S.C. section 360bbb-3(b)(1), unless the authorization is terminated or revoked sooner. Performed at [facility], [place] [address], [place], [place] [address]          Radiology Studies: Ct Head Wo Contrast  Result Date: 01/06/2019 CLINICAL DATA:  Altered mental status.  History of dementia. EXAM: CT HEAD WITHOUT CONTRAST  TECHNIQUE: Contiguous axial images were obtained from the base of the skull through the vertex without intravenous contrast. COMPARISON:  Brain MRI, 08/08/2012 FINDINGS: Brain: No evidence of acute infarction, hemorrhage, hydrocephalus, extra-axial collection or mass lesion/mass effect. There is mild ventricular enlargement with more prominent sulcal enlargement reflecting generalized atrophy. Mild periventricular white matter hypoattenuation is noted consistent with chronic microvascular ischemic change. Vascular: No hyperdense vessel or unexpected calcification. Skull: No skull fracture.  79 old burr holes are noted on the left. Sinuses/Orbits: Globes and orbits are unremarkable. Sinuses and mastoid air cells are clear. Other: None.  IMPRESSION: 1. No acute intracranial abnormalities. 2. Atrophy and mild chronic microvascular ischemic change. Electronically Signed   By: Lajean Manes M.D.   On: 01/06/2019 16:48   Ct Abdomen Pelvis W Contrast  Result Date: 01/06/2019 CLINICAL DATA:  Patient with history of bladder cancer, who completed chemotherapy on 07/14 and radiation therapy on 07/17. Weakness, weight loss, change in bowel habits. EXAM: CT ABDOMEN AND PELVIS WITH CONTRAST TECHNIQUE: Multidetector CT imaging of the abdomen and pelvis was performed using the standard protocol following bolus administration of intravenous contrast. CONTRAST:  169mL OMNIPAQUE IOHEXOL 300 MG/ML  SOLN COMPARISON:  September 21, 2018 FINDINGS: Lower chest: Chronic elevation of the left hemidiaphragm. Atelectatic changes in the left lung base. Hepatobiliary: Normal appearance of the liver. Large gallbladder calculus, unchanged. No evidence of biliary ductal dilation. Pancreas: Unremarkable. No pancreatic ductal dilatation or surrounding inflammatory changes. Spleen: Normal in size without focal abnormality. Adrenals/Urinary Tract: Nodular appearance of the left adrenal gland. Mild bilateral renal cortical thinning. No evidence of left nephrolithiasis hydronephrosis. Right ureteral stent with proximal end within the renal pelvis and distal end within the trigonal region of the urinary bladder. Mild right hydronephrosis and hydroureter. Perinephric and periureteral stranding. Mucosal thickening of the base of the urinary bladder and proximal urethra. Stomach/Bowel: Stomach is within normal limits. No evidence of bowel wall thickening, distention, or inflammatory changes. Vascular/Lymphatic: Aortic atherosclerosis, minimal. No enlarged abdominal or pelvic lymph nodes by CT criteria. Shotty retroperitoneal lymph nodes are present. Reproductive: The prostate is not enlarged. Other: No abdominal wall hernia or abnormality. No abdominopelvic ascites. Musculoskeletal: No  acute or significant osseous findings. IMPRESSION: 1. Mild right hydronephrosis and hydroureter with perinephric and periureteral stranding. Right ureteral stent appropriately positioned. 2. Mucosal thickening at the base of the urinary bladder and proximal urethra. 3. Cholelithiasis without evidence of cholecystitis. Aortic Atherosclerosis (ICD10-I70.0). Electronically Signed   By: Fidela Salisbury M.D.   On: 01/06/2019 17:01   Dg Chest Port 1 View  Result Date: 01/06/2019 CLINICAL DATA:  Weakness. EXAM: PORTABLE CHEST 1 VIEW COMPARISON:  09/25/2016 FINDINGS: Lungs are adequately inflated with moderate stable elevation of the left hemidiaphragm. Lungs are otherwise clear. Cardiomediastinal silhouette and remainder of the exam is unchanged. IMPRESSION: No acute findings. Stable elevation left hemidiaphragm. Electronically Signed   By: Marin Olp M.D.   On: 01/06/2019 13:54        Scheduled Meds:  Chlorhexidine Gluconate Cloth  6 each Topical Daily   dutasteride  0.5 mg Oral Daily   insulin aspart  0-15 Units Subcutaneous TID WC   insulin aspart  0-5 Units Subcutaneous QHS   memantine  28 mg Oral q morning - 10a   omega-3 acid ethyl esters  1 g Oral Daily   phenazopyridine  200 mg Oral Once   Continuous Infusions:  ceFEPime (MAXIPIME) IV     lactated ringers 125 mL/hr at 01/07/19 0402   potassium  chloride       LOS: 1 day    Time spent: 35 min    Nicolette Bang, MD [facility]  If 7PM-7AM, please contact night-coverage  01/07/2019, 10:45 AM

## 2019-01-07 NOTE — Progress Notes (Signed)
Pharmacy Antibiotic Note  [name] [name] is a 78 y.o. male admitted on 01/06/2019 with UTI.  Pharmacy has been consulted for cefepime dosing.  Today, 01/07/2019:  WBC remain low  AKI resolved  Remains afebrile  Plan:  Adjust cefepime to 2g IV q12 with improved renal function. Patient septic on admission, but now with normal vitals signs so will opt for UTI dosing.  Pharmacy will sign off, following peripherally for culture results, dose adjustments, and changes in clinical status  Height: 6\' 1"  (185.4 cm) Weight: 165 lb 12.6 oz (75.2 kg) IBW/kg (Calculated) : 79.9  Temp (24hrs), Avg:97.6 F (36.4 C), Min:97.1 F (36.2 C), Max:98.2 F (36.8 C)  Recent Labs  Lab 01/06/19 1419 01/06/19 1858 01/07/19 0017  WBC 3.0*  --  2.6*  CREATININE 1.29*  --  1.03  LATICACIDVEN 2.4* 1.4  --     Estimated Creatinine Clearance: 65.9 mL/min (by C-G formula based on SCr of 1.03 mg/dL).    Allergies  Allergen Reactions  . Sulfa Antibiotics Hives  . Tetracyclines & Related Rash  . Aricept [Donepezil Hcl] Nausea And Vomiting   Antimicrobials this admission: 7/24 Rocephin/Flagyl x 1 7/24 Cefepime >>   Dose adjustments this admission: 7/25 adj cefepime to q12 with improved SCr  Microbiology results: 7/24 BCx: sent 7/24 UCx: sent 7/25 UCx: sent    Thank you for allowing pharmacy to be a part of this patient's care.  Reuel Boom, PharmD, BCPS [phone] 01/07/2019, 9:19 AM

## 2019-01-08 LAB — CBC WITH DIFFERENTIAL/PLATELET
Abs Immature Granulocytes: 0.05 10*3/uL (ref 0.00–0.07)
Basophils Absolute: 0 10*3/uL (ref 0.0–0.1)
Basophils Relative: 0 %
Eosinophils Absolute: 0 10*3/uL (ref 0.0–0.5)
Eosinophils Relative: 1 %
HCT: 21.1 % — ABNORMAL LOW (ref 39.0–52.0)
Hemoglobin: 7.2 g/dL — ABNORMAL LOW (ref 13.0–17.0)
Immature Granulocytes: 2 %
Lymphocytes Relative: 13 %
Lymphs Abs: 0.3 10*3/uL — ABNORMAL LOW (ref 0.7–4.0)
MCH: 30.8 pg (ref 26.0–34.0)
MCHC: 34.1 g/dL (ref 30.0–36.0)
MCV: 90.2 fL (ref 80.0–100.0)
Monocytes Absolute: 0.5 10*3/uL (ref 0.1–1.0)
Monocytes Relative: 21 %
Neutro Abs: 1.4 10*3/uL — ABNORMAL LOW (ref 1.7–7.7)
Neutrophils Relative %: 63 %
Platelets: 111 10*3/uL — ABNORMAL LOW (ref 150–400)
RBC: 2.34 MIL/uL — ABNORMAL LOW (ref 4.22–5.81)
RDW: 20.8 % — ABNORMAL HIGH (ref 11.5–15.5)
WBC: 2.1 10*3/uL — ABNORMAL LOW (ref 4.0–10.5)
nRBC: 0 % (ref 0.0–0.2)

## 2019-01-08 LAB — GLUCOSE, CAPILLARY
Glucose-Capillary: 177 mg/dL — ABNORMAL HIGH (ref 70–99)
Glucose-Capillary: 152 mg/dL — ABNORMAL HIGH (ref 70–99)
Glucose-Capillary: 126 mg/dL — ABNORMAL HIGH (ref 70–99)
Glucose-Capillary: 182 mg/dL — ABNORMAL HIGH (ref 70–99)

## 2019-01-08 LAB — BASIC METABOLIC PANEL
Anion gap: 15 (ref 5–15)
BUN: 26 mg/dL — ABNORMAL HIGH (ref 8–23)
CO2: 18 mmol/L — ABNORMAL LOW (ref 22–32)
Calcium: 7.7 mg/dL — ABNORMAL LOW (ref 8.9–10.3)
Chloride: 104 mmol/L (ref 98–111)
Creatinine, Ser: 0.99 mg/dL (ref 0.61–1.24)
GFR calc Af Amer: >60 mL/min (ref >60)
GFR calc non Af Amer: >60 mL/min (ref >60)
Glucose, Bld: 200 mg/dL — ABNORMAL HIGH (ref 70–99)
Potassium: 3.8 mmol/L (ref 3.5–5.1)
Sodium: 137 mmol/L (ref 135–145)

## 2019-01-08 MED ORDER — VANCOMYCIN HCL 10 G IV SOLR
1750.0000 mg | Freq: Once | INTRAVENOUS | Status: AC
  Administered 2019-01-08: 1750 mg via INTRAVENOUS
  Filled 2019-01-08: qty 1750

## 2019-01-08 MED ORDER — VANCOMYCIN HCL IN DEXTROSE 750-5 MG/150ML-% IV SOLN
750.0000 mg | Freq: Two times a day (BID) | INTRAVENOUS | Status: DC
  Administered 2019-01-09 – 2019-01-10 (×3): 750 mg via INTRAVENOUS
  Filled 2019-01-08 (×4): qty 150

## 2019-01-08 NOTE — Progress Notes (Signed)
PROGRESS NOTE    [name] [name]  EAV:[id number] DOB: Sep 27, 1940 DOA: 01/06/2019 PCP: [name], MD   Brief Narrative:  NWG:NFAOZHY [name] [name] a 78 y.o.malewith medical history significant forbladder cancer status post chemo on carboplatin most recent treatment July 14 as well as radiation 35 treatments most recent treatment July 17 who presents from home with failure to thrive, diarrhea anorexia with approximately 30 pound weight loss. Patient's past no history also includes non-insulin-dependent diabetes, hypertension, hyperlipidemia, history of subdural hematoma and multiple skin cancer status post excision. Wife reports he has had a progressive decline over the last 1 to 2 weeks with significant decreased p.o. intake over the same time.. Patient's had bowel and bladder incontinence. This been ongoing since stent placement in March for urinary incontinence with bladder cancer. He also reports abdominal pain which is been difficult to characterize secondary patient's profound dementia. Wife reports that he points to his general stomach area without a focal finding unable to verbalize more specific findings. Wife reports he is also been too weak to walk in the hallways with what appears to be a progressive decline given his chemo radiation bladder cancer and decreased p.o. intake.  ED Course:In the ER lab patient's blood pressure noted to be low consistent with sepsis. He received 2 L of normal saline with an appropriate response, white count is 3although in the setting of recent chemotherapy with 68% neutrophils hemoglobin 9.6 BMP notable for mildly low potassium at 3.2 CO2 was 27 BUN and creatinine 51 and 1.3. Lactic acid was two-point 4 repeat pending. Patient was COVID negative UA is notable for large leukocytes and positive nitrites specific gravity 1.016, urine culture ordered, blood cultures ordered but appear not to be collected unfortunately antibiotics averted  been administered in the ER. CT performed showing mild right hydro-with right ureteral stent cholelithiasis without evidence of cholecystitis and mucosal thickening of the base of urinary bladder and proximal urethra in his known bladder cancer patient   Assessment & Plan:   Principal Problem:   Severe sepsis ([place]) Active Problems:   Essential hypertension   Type 2 diabetes mellitus without complication ([place])   Acute lower UTI   Dementia without behavioral disturbance (H[facility])   Sepsis ([place])   History of subdural hematoma   Severe sepsis. Patient was admitted under sepsis protocol, repeat lactic acid was 1.4, he received appropriate fluid resuscitation, will titrate ns resus down cont to treat the patient with cefepime, follow urine culture-reincubating, blood cultures which are NTD, monitor CBC in the morning,  Urinary tract infection in the setting of bladder cancer. Treating with antibiotics as noted above follow urine culture as above, patient with known bladder cancer with bladder stent placed in March 2020.  Follow CBC mildly neutropenic  Hypertension:Patient was actually hypotensive but blood pressure is improving, again hold blood pressure medications fluid resuscitate.as above  Non-insulin-dependent diabetes. I will hold patient's home medications, placed on sliding scale insulin ADA diet check blood glucose AC at bedtime.  Dementia without behavioral disturbance. Patient stable continue with home meds no change  DVT prophylaxis: SCD/Compression stockings  Code Status: dnr    Code Status Orders  (From admission, onward)         Start     Ordered   01/06/19 2005  Do not attempt resuscitation (DNR)  Continuous    Question Answer Comment  In the event of cardiac or respiratory ARREST Do not call a code blue   In the event of cardiac or respiratory ARREST  Do not perform Intubation, CPR, defibrillation or ACLS   In the event of cardiac or respiratory ARREST Use  medication by any route, position, wound care, and other measures to relive pain and suffering. May use oxygen, suction and manual treatment of airway obstruction as needed for comfort.      01/06/19 2004        Code Status History    Date Active Date Inactive Code Status Order ID Comments User Context   10/12/2013 1600 10/13/2013 1502 Full Code [id number]  Raynelle Bring, MD Inpatient   Advance Care Planning Activity     Family Communication: Discussed with wife today Disposition Plan:   Patient will remain inpatient in  the stepdown unit. He will need continuous IV fluids, IV antibiotics, frequent lab checks, frequent nurse care in the setting of urinary tract infection with severe sepsis. Without these treatments in my opinion patient at risk of severe clinical deterioration which could be life-threatening  Consults called: None Admission status: Inpatient   Consultants:   None  Procedures:  Ct Head Wo Contrast  Result Date: 01/06/2019 CLINICAL DATA:  Altered mental status.  History of dementia. EXAM: CT HEAD WITHOUT CONTRAST TECHNIQUE: Contiguous axial images were obtained from the base of the skull through the vertex without intravenous contrast. COMPARISON:  Brain MRI, 08/08/2012 FINDINGS: Brain: No evidence of acute infarction, hemorrhage, hydrocephalus, extra-axial collection or mass lesion/mass effect. There is mild ventricular enlargement with more prominent sulcal enlargement reflecting generalized atrophy. Mild periventricular white matter hypoattenuation is noted consistent with chronic microvascular ischemic change. Vascular: No hyperdense vessel or unexpected calcification. Skull: No skull fracture.  45 old burr holes are noted on the left. Sinuses/Orbits: Globes and orbits are unremarkable. Sinuses and mastoid air cells are clear. Other: None. IMPRESSION: 1. No acute intracranial abnormalities. 2. Atrophy and mild chronic microvascular ischemic change. Electronically Signed    By: Lajean Manes M.D.   On: 01/06/2019 16:48   Ct Abdomen Pelvis W Contrast  Result Date: 01/06/2019 CLINICAL DATA:  Patient with history of bladder cancer, who completed chemotherapy on 07/14 and radiation therapy on 07/17. Weakness, weight loss, change in bowel habits. EXAM: CT ABDOMEN AND PELVIS WITH CONTRAST TECHNIQUE: Multidetector CT imaging of the abdomen and pelvis was performed using the standard protocol following bolus administration of intravenous contrast. CONTRAST:  122mL OMNIPAQUE IOHEXOL 300 MG/ML  SOLN COMPARISON:  September 21, 2018 FINDINGS: Lower chest: Chronic elevation of the left hemidiaphragm. Atelectatic changes in the left lung base. Hepatobiliary: Normal appearance of the liver. Large gallbladder calculus, unchanged. No evidence of biliary ductal dilation. Pancreas: Unremarkable. No pancreatic ductal dilatation or surrounding inflammatory changes. Spleen: Normal in size without focal abnormality. Adrenals/Urinary Tract: Nodular appearance of the left adrenal gland. Mild bilateral renal cortical thinning. No evidence of left nephrolithiasis hydronephrosis. Right ureteral stent with proximal end within the renal pelvis and distal end within the trigonal region of the urinary bladder. Mild right hydronephrosis and hydroureter. Perinephric and periureteral stranding. Mucosal thickening of the base of the urinary bladder and proximal urethra. Stomach/Bowel: Stomach is within normal limits. No evidence of bowel wall thickening, distention, or inflammatory changes. Vascular/Lymphatic: Aortic atherosclerosis, minimal. No enlarged abdominal or pelvic lymph nodes by CT criteria. Shotty retroperitoneal lymph nodes are present. Reproductive: The prostate is not enlarged. Other: No abdominal wall hernia or abnormality. No abdominopelvic ascites. Musculoskeletal: No acute or significant osseous findings. IMPRESSION: 1. Mild right hydronephrosis and hydroureter with perinephric and periureteral  stranding. Right ureteral stent appropriately positioned. 2.  Mucosal thickening at the base of the urinary bladder and proximal urethra. 3. Cholelithiasis without evidence of cholecystitis. Aortic Atherosclerosis (ICD10-I70.0). Electronically Signed   By: Fidela [place] M.D.   On: 01/06/2019 17:01   Dg Chest Port 1 View  Result Date: 01/06/2019 CLINICAL DATA:  Weakness. EXAM: PORTABLE CHEST 1 VIEW COMPARISON:  09/25/2016 FINDINGS: Lungs are adequately inflated with moderate stable elevation of the left hemidiaphragm. Lungs are otherwise clear. Cardiomediastinal silhouette and remainder of the exam is unchanged. IMPRESSION: No acute findings. Stable elevation left hemidiaphragm. Electronically Signed   By: Marin Olp M.D.   On: 01/06/2019 13:54     Antimicrobials:   Cefepime day 3   Subjective: No acute events overnight. Patient resting in bed comfortably, is mildly anxious which is expected given patient's known severe dementia  Objective: Vitals:   01/08/19 0000 01/08/19 0400 01/08/19 0422 01/08/19 0750  BP:  123/76    Pulse:  (!) 105    Resp:  16    Temp: (!) 97 F (36.1 C)  97.7 F (36.5 C) 97.7 F (36.5 C)  TempSrc: Axillary  Axillary Oral  SpO2:  96%    Weight:      Height:        Intake/Output Summary (Last 24 hours) at 01/08/2019 1002 Last data filed at 01/08/2019 0500 Gross per 24 hour  Intake 2517.69 ml  Output 1575 ml  Net 942.69 ml   Filed Weights   01/06/19 1700 01/06/19 2004  Weight: 82.1 kg 75.2 kg    Examination:  General exam: Appears mildly anxious, baseline confusion with known advanced chronic dementia Respiratory system: Clear to auscultation. Respiratory effort normal. Cardiovascular system: S1 & S2 heard, RRR. No JVD, murmurs, rubs, gallops or clicks. No pedal edema. Gastrointestinal system: Abdomen is nondistended, soft and nontender. No organomegaly or masses felt. Normal bowel sounds heard. Central nervous system: Alert,. No focal  neurological deficits. Extremities: Symmetric 5 x 5 strength, warm well perfused Skin: No rashes, lesions or ulcers Psychiatry: Judgement and insight impaired with chronic dementia. Mood & affect anxious    Data Reviewed: I have personally reviewed following labs and imaging studies  CBC: Recent Labs  Lab 01/06/19 1419 01/07/19 0017 01/08/19 0227  WBC 3.0* 2.6* 2.1*  NEUTROABS 2.0  --  1.4*  HGB 9.6* 8.4* 7.2*  HCT 27.4* 24.9* 21.1*  MCV 87.3 89.9 90.2  PLT 150 126* 778*   Basic Metabolic Panel: Recent Labs  Lab 01/06/19 1419 01/07/19 0017 01/08/19 0227  NA 136 137 137  K 3.2* 3.1* 3.8  CL 97* 104 104  CO2 27 23 18*  GLUCOSE 116* 139* 200*  BUN 51* 38* 26*  CREATININE 1.29* 1.03 0.99  CALCIUM 9.0 7.9* 7.7*   GFR: Estimated Creatinine Clearance: 68.6 mL/min (by C-G formula based on SCr of 0.99 mg/dL). Liver Function Tests: Recent Labs  Lab 01/06/19 1419 01/07/19 0017  AST 24 22  ALT 24 21  ALKPHOS 61 54  BILITOT 1.6* 1.4*  PROT 6.9 5.9*  ALBUMIN 3.9 3.3*   No results for input(s): LIPASE, AMYLASE in the last 168 hours. No results for input(s): AMMONIA in the last 168 hours. Coagulation Profile: Recent Labs  Lab 01/07/19 0017  INR 1.2   Cardiac Enzymes: No results for input(s): CKTOTAL, CKMB, CKMBINDEX, TROPONINI in the last 168 hours. BNP (last 3 results) No results for input(s): PROBNP in the last 8760 hours. HbA1C: No results for input(s): HGBA1C in the last 72 hours. CBG: Recent Labs  Lab 01/07/19 0817 01/07/19 1156 01/07/19 1641 01/07/19 2134 01/08/19 0713  GLUCAP 147* 122* 101* 178* 177*   Lipid Profile: No results for input(s): CHOL, HDL, LDLCALC, TRIG, CHOLHDL, LDLDIRECT in the last 72 hours. Thyroid Function Tests: No results for input(s): TSH, T4TOTAL, FREET4, T3FREE, THYROIDAB in the last 72 hours. Anemia Panel: No results for input(s): VITAMINB12, FOLATE, FERRITIN, TIBC, IRON, RETICCTPCT in the last 72 hours. Sepsis  Labs: Recent Labs  Lab 01/06/19 1419 01/06/19 1858 01/07/19 0017  PROCALCITON  --   --  <0.10  LATICACIDVEN 2.4* 1.4  --     Recent Results (from the past 240 hour(s))  Blood culture (routine x 2)     Status: None (Preliminary result)   Collection Time: 01/06/19  2:19 PM   Specimen: BLOOD LEFT HAND  Result Value Ref Range Status   Specimen Description   Final    BLOOD LEFT HAND Performed at [facility], [place] [address] [place], [place] [address]    Special Requests   Final    BOTTLES DRAWN AEROBIC AND ANAEROBIC Blood Culture results may not be optimal due to an inadequate volume of blood received in culture bottles Performed at [facility] [facility] [address] [place], [place] [address]    Culture   Final    NO GROWTH 2 DAYS Performed at [facility] [address] [place], [place] [address]    Report Status PENDING  Incomplete  Blood culture (routine x 2)     Status: None (Preliminary result)   Collection Time: 01/06/19  2:19 PM   Specimen: BLOOD  Result Value Ref Range Status   Specimen Description   Final    BLOOD RIGHT ANTECUBITAL Performed at [facility] [address] [place], [place] [address]    Special Requests   Final    BOTTLES DRAWN AEROBIC AND ANAEROBIC Blood Culture adequate volume Performed at [facility] [address] [place], [place] [address]    Culture   Final    NO GROWTH 2 DAYS Performed at [facility] [address] [place], [place] [address]    Report Status PENDING  Incomplete  Urine culture     Status: Abnormal   Collection Time: 01/06/19  2:19 PM   Specimen: Urine, Clean Catch  Result Value Ref Range Status   Specimen Description   Final    URINE, CLEAN CATCH Performed at [facility], [place] [address] [place],  [address]    Special Requests   Final    NONE Performed at [facility], [place]  [address], [place], [place] [address]    Culture >=100,000 COLONIES/mL STAPHYLOCOCCUS EPIDERMIDIS (A)  Final   Report Status 01/08/2019 FINAL  Final   Organism ID, Bacteria STAPHYLOCOCCUS EPIDERMIDIS (A)  Final      Susceptibility   Staphylococcus epidermidis - MIC*    CIPROFLOXACIN 4 RESISTANT Resistant     GENTAMICIN <=0.5 SENSITIVE Sensitive     NITROFURANTOIN <=16 SENSITIVE Sensitive     OXACILLIN >=4 RESISTANT Resistant     TETRACYCLINE 2 SENSITIVE Sensitive     VANCOMYCIN 2 SENSITIVE Sensitive     TRIMETH/SULFA 160 RESISTANT Resistant     CLINDAMYCIN <=0.25 SENSITIVE Sensitive     RIFAMPIN <=0.5 SENSITIVE Sensitive     Inducible Clindamycin NEGATIVE Sensitive     * >=100,000 COLONIES/mL STAPHYLOCOCCUS EPIDERMIDIS  SARS Coronavirus 2 (CEPHEID - Performed in [place] hospital lab),  Hosp Order     Status: None   Collection Time: 01/06/19  2:19 PM   Specimen: Nasopharyngeal Swab  Result Value Ref Range Status   SARS Coronavirus 2 NEGATIVE NEGATIVE Final    Comment: (NOTE) If result is NEGATIVE SARS-CoV-2 target nucleic acids are NOT DETECTED. The SARS-CoV-2 RNA is generally detectable in upper and lower  respiratory specimens during the acute phase of infection. The lowest  concentration of SARS-CoV-2 viral copies this assay can detect is 250  copies / mL. A negative result does not preclude SARS-CoV-2 infection  and should not be used as the sole basis for treatment or other  patient management decisions.  A negative result may occur with  improper specimen collection / handling, submission of specimen other  than nasopharyngeal swab, presence of viral mutation(s) within the  areas targeted by this assay, and inadequate number of viral copies  (<250 copies / mL). A negative result must be combined with clinical  observations, patient history, and epidemiological information. If result is POSITIVE SARS-CoV-2 target nucleic acids are DETECTED. The SARS-CoV-2 RNA is generally  detectable in upper and lower  respiratory specimens dur ing the acute phase of infection.  Positive  results are indicative of active infection with SARS-CoV-2.  Clinical  correlation with patient history and other diagnostic information is  necessary to determine patient infection status.  Positive results do  not rule out bacterial infection or co-infection with other viruses. If result is PRESUMPTIVE POSTIVE SARS-CoV-2 nucleic acids MAY BE PRESENT.   A presumptive positive result was obtained on the submitted specimen  and confirmed on repeat testing.  While 2019 novel coronavirus  (SARS-CoV-2) nucleic acids may be present in the submitted sample  additional confirmatory testing may be necessary for epidemiological  and / or clinical management purposes  to differentiate between  SARS-CoV-2 and other Sarbecovirus currently known to infect humans.  If clinically indicated additional testing with an alternate test  methodology [phone]) is advised. The SARS-CoV-2 RNA is generally  detectable in upper and lower respiratory sp ecimens during the acute  phase of infection. The expected result is Negative. Fact Sheet for Patients:  StrictlyIdeas.no Fact Sheet for Healthcare Providers: BankingDealers.co.za This test is not yet approved or cleared by the [place] FDA and has been authorized for detection and/or diagnosis of SARS-CoV-2 by FDA under an Emergency Use Authorization (EUA).  This EUA will remain in effect (meaning this test can be used) for the duration of the COVID-19 declaration under Section 564(b)(1) of the Act, 21 U.S.C. section 360bbb-3(b)(1), unless the authorization is terminated or revoked sooner. Performed at [facility], [place] [address] [place], [place] [address]   Urine culture     Status: None (Preliminary result)   Collection Time: 01/06/19  8:05 PM   Specimen: Urine, Random  Result Value  Ref Range Status   Specimen Description   Final    URINE, RANDOM Performed at [facility] [address], [place], [place] [address]    Special Requests   Final    NONE Performed at [facility], [place] [address], [place], [place] [address]    Culture   Final    CULTURE REINCUBATED FOR BETTER GROWTH Performed at [facility], [place] [address], [place],  [address]    Report Status PENDING  Incomplete         Radiology Studies: Ct Head Wo Contrast  Result Date: 01/06/2019 CLINICAL DATA:  Altered mental  status.  History of dementia. EXAM: CT HEAD WITHOUT CONTRAST TECHNIQUE: Contiguous axial images were obtained from the base of the skull through the vertex without intravenous contrast. COMPARISON:  Brain MRI, 08/08/2012 FINDINGS: Brain: No evidence of acute infarction, hemorrhage, hydrocephalus, extra-axial collection or mass lesion/mass effect. There is mild ventricular enlargement with more prominent sulcal enlargement reflecting generalized atrophy. Mild periventricular white matter hypoattenuation is noted consistent with chronic microvascular ischemic change. Vascular: No hyperdense vessel or unexpected calcification. Skull: No skull fracture.  78 old burr holes are noted on the left. Sinuses/Orbits: Globes and orbits are unremarkable. Sinuses and mastoid air cells are clear. Other: None. IMPRESSION: 1. No acute intracranial abnormalities. 2. Atrophy and mild chronic microvascular ischemic change. Electronically Signed   By: Lajean Manes M.D.   On: 01/06/2019 16:48   Ct Abdomen Pelvis W Contrast  Result Date: 01/06/2019 CLINICAL DATA:  Patient with history of bladder cancer, who completed chemotherapy on 07/14 and radiation therapy on 07/17. Weakness, weight loss, change in bowel habits. EXAM: CT ABDOMEN AND PELVIS WITH CONTRAST TECHNIQUE: Multidetector CT imaging of the abdomen and pelvis was performed using the standard protocol  following bolus administration of intravenous contrast. CONTRAST:  165mL OMNIPAQUE IOHEXOL 300 MG/ML  SOLN COMPARISON:  September 21, 2018 FINDINGS: Lower chest: Chronic elevation of the left hemidiaphragm. Atelectatic changes in the left lung base. Hepatobiliary: Normal appearance of the liver. Large gallbladder calculus, unchanged. No evidence of biliary ductal dilation. Pancreas: Unremarkable. No pancreatic ductal dilatation or surrounding inflammatory changes. Spleen: Normal in size without focal abnormality. Adrenals/Urinary Tract: Nodular appearance of the left adrenal gland. Mild bilateral renal cortical thinning. No evidence of left nephrolithiasis hydronephrosis. Right ureteral stent with proximal end within the renal pelvis and distal end within the trigonal region of the urinary bladder. Mild right hydronephrosis and hydroureter. Perinephric and periureteral stranding. Mucosal thickening of the base of the urinary bladder and proximal urethra. Stomach/Bowel: Stomach is within normal limits. No evidence of bowel wall thickening, distention, or inflammatory changes. Vascular/Lymphatic: Aortic atherosclerosis, minimal. No enlarged abdominal or pelvic lymph nodes by CT criteria. Shotty retroperitoneal lymph nodes are present. Reproductive: The prostate is not enlarged. Other: No abdominal wall hernia or abnormality. No abdominopelvic ascites. Musculoskeletal: No acute or significant osseous findings. IMPRESSION: 1. Mild right hydronephrosis and hydroureter with perinephric and periureteral stranding. Right ureteral stent appropriately positioned. 2. Mucosal thickening at the base of the urinary bladder and proximal urethra. 3. Cholelithiasis without evidence of cholecystitis. Aortic Atherosclerosis (ICD10-I70.0). Electronically Signed   By: Fidela [place] M.D.   On: 01/06/2019 17:01   Dg Chest Port 1 View  Result Date: 01/06/2019 CLINICAL DATA:  Weakness. EXAM: PORTABLE CHEST 1 VIEW COMPARISON:   09/25/2016 FINDINGS: Lungs are adequately inflated with moderate stable elevation of the left hemidiaphragm. Lungs are otherwise clear. Cardiomediastinal silhouette and remainder of the exam is unchanged. IMPRESSION: No acute findings. Stable elevation left hemidiaphragm. Electronically Signed   By: Marin Olp M.D.   On: 01/06/2019 13:54        Scheduled Meds:  Chlorhexidine Gluconate Cloth  6 each Topical Daily   dutasteride  0.5 mg Oral Daily   insulin aspart  0-15 Units Subcutaneous TID WC   insulin aspart  0-5 Units Subcutaneous QHS   memantine  28 mg Oral q morning - 10a   omega-3 acid ethyl esters  1 g Oral Daily   tamsulosin  0.4 mg Oral Daily   Continuous Infusions:  ceFEPime (MAXIPIME) IV Stopped (01/07/19 2313)  lactated ringers 125 mL/hr at 01/08/19 0241     LOS: 2 days    Time spent: 35 min    Nicolette Bang, MD [facility]  If 7PM-7AM, please contact night-coverage  01/08/2019, 10:02 AM

## 2019-01-08 NOTE — Progress Notes (Signed)
Pharmacy Antibiotic Note  [name] [name] is a 78 y.o. male admitted on 01/06/2019 with UTI.  Pharmacy has been consulted for vancomycin dosing based on new urine culture results. Already on cefepime  Today, 01/08/2019:  WBC remain low (bladder CA)  AKI resolved  Remains afebrile but reportedly hypotensive yesterday  Methicillin-resistant staph epi in multiple UCx  Plan: Vancomycin 1750 mg IV now, then 750 mg IV q12 hr (est AUC 458 based on SCr 1; Vd 0.72)  Measure vancomycin AUC at steady state as indicated  Continue cefepime for now per MD, can continue one more clinically stable  SCr q48 hr while on vanc  Height: 6\' 1"  (185.4 cm) Weight: 165 lb 12.6 oz (75.2 kg) IBW/kg (Calculated) : 79.9  Temp (24hrs), Avg:97.7 F (36.5 C), Min:97 F (36.1 C), Max:98.2 F (36.8 C)  Recent Labs  Lab 01/06/19 1419 01/06/19 1858 01/07/19 0017 01/08/19 0227  WBC 3.0*  --  2.6* 2.1*  CREATININE 1.29*  --  1.03 0.99  LATICACIDVEN 2.4* 1.4  --   --     Estimated Creatinine Clearance: 68.6 mL/min (by C-G formula based on SCr of 0.99 mg/dL).    Allergies  Allergen Reactions  . Sulfa Antibiotics Hives  . Tetracyclines & Related Rash  . Aricept [Donepezil Hcl] Nausea And Vomiting   Antimicrobials this admission: 7/24 Rocephin/Flagyl x 1 7/24 Cefepime >>  7/26 vanc >>   Dose adjustments this admission: 7/25 adj cefepime to q12 with improved SCr  Microbiology results: 7/24 BCx: sent 7/24 UCx: >100k MR staph epi (S-clinda, NTF, doxy, vanc, R-bactrim) 7/25 UCx: same as above   Thank you for allowing pharmacy to be a part of this patient's care.  Reuel Boom, PharmD, BCPS [phone] 01/08/2019, 4:06 PM

## 2019-01-09 DIAGNOSIS — N1330 Unspecified hydronephrosis: Secondary | ICD-10-CM

## 2019-01-09 DIAGNOSIS — C679 Malignant neoplasm of bladder, unspecified: Secondary | ICD-10-CM

## 2019-01-09 DIAGNOSIS — R32 Unspecified urinary incontinence: Secondary | ICD-10-CM

## 2019-01-09 DIAGNOSIS — Z960 Presence of urogenital implants: Secondary | ICD-10-CM

## 2019-01-09 DIAGNOSIS — R413 Other amnesia: Secondary | ICD-10-CM

## 2019-01-09 DIAGNOSIS — N390 Urinary tract infection, site not specified: Secondary | ICD-10-CM

## 2019-01-09 DIAGNOSIS — Z881 Allergy status to other antibiotic agents status: Secondary | ICD-10-CM

## 2019-01-09 DIAGNOSIS — Z888 Allergy status to other drugs, medicaments and biological substances status: Secondary | ICD-10-CM

## 2019-01-09 DIAGNOSIS — D61818 Other pancytopenia: Secondary | ICD-10-CM

## 2019-01-09 LAB — CBC WITH DIFFERENTIAL/PLATELET
Abs Immature Granulocytes: 0.07 10*3/uL (ref 0.00–0.07)
Basophils Absolute: 0 10*3/uL (ref 0.0–0.1)
Basophils Relative: 0 %
Eosinophils Absolute: 0 10*3/uL (ref 0.0–0.5)
Eosinophils Relative: 0 %
HCT: 21.6 % — ABNORMAL LOW (ref 39.0–52.0)
Hemoglobin: 7.1 g/dL — ABNORMAL LOW (ref 13.0–17.0)
Immature Granulocytes: 3 %
Lymphocytes Relative: 13 %
Lymphs Abs: 0.3 10*3/uL — ABNORMAL LOW (ref 0.7–4.0)
MCH: 30.2 pg (ref 26.0–34.0)
MCHC: 32.9 g/dL (ref 30.0–36.0)
MCV: 91.9 fL (ref 80.0–100.0)
Monocytes Absolute: 0.5 10*3/uL (ref 0.1–1.0)
Monocytes Relative: 21 %
Neutro Abs: 1.5 10*3/uL — ABNORMAL LOW (ref 1.7–7.7)
Neutrophils Relative %: 63 %
Platelets: 118 10*3/uL — ABNORMAL LOW (ref 150–400)
RBC: 2.35 MIL/uL — ABNORMAL LOW (ref 4.22–5.81)
RDW: 21.8 % — ABNORMAL HIGH (ref 11.5–15.5)
WBC: 2.4 10*3/uL — ABNORMAL LOW (ref 4.0–10.5)
nRBC: 0 % (ref 0.0–0.2)

## 2019-01-09 LAB — BASIC METABOLIC PANEL
Anion gap: 12 (ref 5–15)
BUN: 16 mg/dL (ref 8–23)
CO2: 20 mmol/L — ABNORMAL LOW (ref 22–32)
Calcium: 7.7 mg/dL — ABNORMAL LOW (ref 8.9–10.3)
Chloride: 106 mmol/L (ref 98–111)
Creatinine, Ser: 0.92 mg/dL (ref 0.61–1.24)
GFR calc Af Amer: >60 mL/min (ref >60)
GFR calc non Af Amer: >60 mL/min (ref >60)
Glucose, Bld: 198 mg/dL — ABNORMAL HIGH (ref 70–99)
Potassium: 3.8 mmol/L (ref 3.5–5.1)
Sodium: 138 mmol/L (ref 135–145)

## 2019-01-09 LAB — GLUCOSE, CAPILLARY
Glucose-Capillary: 159 mg/dL — ABNORMAL HIGH (ref 70–99)
Glucose-Capillary: 193 mg/dL — ABNORMAL HIGH (ref 70–99)
Glucose-Capillary: 217 mg/dL — ABNORMAL HIGH (ref 70–99)
Glucose-Capillary: 168 mg/dL — ABNORMAL HIGH (ref 70–99)

## 2019-01-09 LAB — URINE CULTURE
Culture: >=100000 — AB
Culture: >=100000 — AB

## 2019-01-09 MED ORDER — METOPROLOL SUCCINATE ER 25 MG PO TB24
25.0000 mg | ORAL_TABLET | Freq: Every day | ORAL | Status: DC
  Administered 2019-01-10: 25 mg via ORAL
  Filled 2019-01-09 (×2): qty 1

## 2019-01-09 NOTE — Consult Note (Signed)
[facility]       Reason for Consult: MR-CoNS UTI/sepsis    Referring Physician: Salomon Fick, MD  Principal Problem:   Severe sepsis [facility]) Active Problems:   Essential hypertension   Type 2 diabetes mellitus without complication ([place])   Acute lower UTI   Dementia without behavioral disturbance (HCC)   Sepsis ([place])   History of subdural hematoma    Chlorhexidine Gluconate Cloth  6 each Topical Daily   dutasteride  0.5 mg Oral Daily   insulin aspart  0-15 Units Subcutaneous TID WC   insulin aspart  0-5 Units Subcutaneous QHS   memantine  28 mg Oral q morning - 10a   metoprolol succinate  25 mg Oral Daily   omega-3 acid ethyl esters  1 g Oral Daily   tamsulosin  0.4 mg Oral Daily    Recommendations: 1. MR-CoNS UTI - Discussed sensitivities with micro lab this AM given cnflicting MICs for some ABX on panel. Given ureteral stent, concern remains that this may be serving a sthe nidus of his most recent infection. Given his bladder CA and known hydronephrosis, I am unclear this can safely be exchanged. Continue vancomycin dosed for a goal trough of 15-20 while he remains an inpt. Due to his lactic acidosis and initial hypOTN, concern for true sepsis despite negative blood cxs does exist. Plan to transition the patient to PO zyvox 600 mg BID at the time of hospital d/c to complete 2 weeks of treatment in total. His platelet count is low but suspect this is due to his recent chemotherapy and should hopefully improve. Recommend that his PCP recheck a CBC as an OP as his treatment nears an end though. D/c cefepime.  2. Bladder CA - Dx;ed in 2013. Recent ureteral stent placed earlier this year with both subsequent XRT and chemo given. Would suspect both of these modalities are palliative given his severe dementia. If his infections return, a serious discussion re: goals of care should be pursued with his wife as he does not appear competent to make this  decision for himself (he has difficulty remembering statements from even 3 minutes prior and this does not appear to be due to sepsis as he has stabilized from that perspective while here). Would hold both chemo and XRT until ABX are complete.  3. Pancytopenia - presumably due to his recent chemotherapy. See remarks above re: zyvox but view this as safer treatment option as this an orally bioequivalent drug (I.e. will treat sepsis) and would hope his thrombocytopenia will improve the further he is from his chemo. Have PCP check a CBC as his zyvox course nears an end.  Assessment: The patient is a 78 y/o WM with advanced dementia, active bladder CA causing obstructive uropathy, s/p ureteral stent placement and receiving active chemo/XRT admitted with lethargy/hypOTN and MR-CoNS UTI.  Antibiotics: Vancomycin, day 2 Cefepime, day 4  HPI: [name] [name] is a 78 y.o.white male aith advanced dementia and active bladder CA receiving recent chemo and XRT admitted with lethargy/FTT (78 y/o) with decreassed PO intake x 1-2 weeks. His bladder CA was diagnosed in 2013. He required placement of a ureteral stent in 07/2018 due to obstructive uropathy, presumed from tumor invasion. To my knowledge, this stent remains intact. He has recently received both XRT and chemo, last treatments on 12/30/2018 and 12/27/2018, respectively. As the patient has advanced dementia, the majority of the history was gathered from record review and his wife's account. He has had  urinary incontinence since this spring, following placement of his ureteral stent. He has had pancytopenia his entire admission thus far, presumably from his chemo. He had a lactic acid level of 2.4 on admission and rather prolonged hypOTN despite several fluid boluses. He was just transferred from the ICU this morning. Two urine cxs were obtained. Both have grown MR-CoNS (one isolate shows more resistances than the other). Following my conversation with the micro lab, both  are confirmed to be linezolid sensitive. His blood cxs remain negative thus far. He denies any flank pain and has been afebrile thus far. Fever curve, WBC trends, ABX usage, and imaging all independently reviewed.  Review of Systems:  Review of Systems  Constitutional: Positive for malaise/fatigue and weight loss. Negative for chills and fever.  HENT: Negative for congestion, hearing loss, sinus pain and sore throat.   Eyes: Negative for blurred vision, photophobia and discharge.  Respiratory: Negative for cough, hemoptysis and shortness of breath.   Cardiovascular: Negative for chest pain, palpitations, orthopnea and leg swelling.  Gastrointestinal: Positive for abdominal pain and nausea. Negative for constipation, diarrhea, heartburn and vomiting.  Genitourinary: Positive for flank pain. Negative for dysuria, frequency and urgency.  Musculoskeletal: Negative for back pain, joint pain and myalgias.  Skin: Negative for itching and rash.  Neurological: Positive for weakness. Negative for tremors, seizures and headaches.  Endo/Heme/Allergies: Negative for polydipsia. Does not bruise/bleed easily.  Psychiatric/Behavioral: Positive for memory loss. Negative for depression and substance abuse. The patient is not nervous/anxious and does not have insomnia.      All other systems reviewed and are negative    Past Medical History:  Diagnosis Date   Alzheimer disease ([place])    Alzheimer's disease ([place])    Bilateral cold feet    Bladder cancer ([place])    dx in 2013   BPH (benign prostatic hypertrophy)    Dementia ([place]) 12-11-11   memory short term(steadily progressive worsening-pt. unaware) .dx. 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   moderate left hemidiaphragm elevation   Hard of hearing    Hearing loss    has one hearing aid, doesn't wear   History of chemotherapy    History of gallstones 2005   History of kidney  stones    Hx of nonmelanoma skin cancer    Hypercholesterolemia    Hypertension 12-11-11   controlled with meds   Increased frequency of urination    wife reports urinary frequency has not improved since last surgery    Pulmonary nodules    Bilateral   Renal calculus, left 10/12/2013   UTI (urinary tract infection) 08-09-13   multiple, last tx. 1 week ago    Social History   Tobacco Use   Smoking status: Never Smoker   Smokeless tobacco: Never Used  Substance Use Topics   Alcohol use: No   Drug use: No    Family History  Problem Relation Age of Onset   Breast cancer Sister    Colon cancer Brother 36     Current Facility-Administered Medications:    acetaminophen (TYLENOL) tablet 650 mg, 650 mg, Oral, Q6H PRN, 650 mg at 01/08/19 2120 **OR** acetaminophen (TYLENOL) suppository 650 mg, 650 mg, Rectal, Q6H PRN, Spongberg, Audie Pinto, MD   ceFEPIme (MAXIPIME) 2 g in sodium chloride 0.9 % 100 mL IVPB, 2 g, Intravenous, Q12H, Wofford, Drew A, RPH, Last Rate: 200 mL/hr at 01/09/19 0933, 2 g at 01/09/19 [id number]  Chlorhexidine Gluconate Cloth 2 % PADS 6 each, 6 each, Topical, Daily, Spongberg, Audie Pinto, MD, 6 each at 01/08/19 1025   dutasteride (AVODART) capsule 0.5 mg, 0.5 mg, Oral, Daily, Spongberg, Audie Pinto, MD, 0.5 mg at 01/09/19 0093   insulin aspart (novoLOG) injection 0-15 Units, 0-15 Units, Subcutaneous, TID WC, Spongberg, Audie Pinto, MD, 3 Units at 01/09/19 1232   insulin aspart (novoLOG) injection 0-5 Units, 0-5 Units, Subcutaneous, QHS, Spongberg, Audie Pinto, MD   ketorolac (TORADOL) 15 MG/ML injection 15 mg, 15 mg, Intravenous, Q6H PRN, Marcell Anger, MD, 15 mg at 01/08/19 0026   lactated ringers infusion, , Intravenous, Continuous, Spongberg, Audie Pinto, MD, Last Rate: 75 mL/hr at 01/09/19 0352   memantine (NAMENDA XR) 24 hr capsule 28 mg, 28 mg, Oral, q morning - 10a, Spongberg, Audie Pinto, MD, 28 mg at 01/09/19 0920    metoprolol succinate (TOPROL-XL) 24 hr tablet 25 mg, 25 mg, Oral, Daily, Spongberg, Audie Pinto, MD   omega-3 acid ethyl esters (LOVAZA) capsule 1 g, 1 g, Oral, Daily, Spongberg, Audie Pinto, MD, 1 g at 01/09/19 0920   ondansetron (ZOFRAN) tablet 4 mg, 4 mg, Oral, Q6H PRN **OR** ondansetron (ZOFRAN) injection 4 mg, 4 mg, Intravenous, Q6H PRN, Spongberg, Audie Pinto, MD   tamsulosin (FLOMAX) capsule 0.4 mg, 0.4 mg, Oral, Daily, Spongberg, Audie Pinto, MD, 0.4 mg at 01/09/19 0920   traMADol (ULTRAM) tablet 50 mg, 50 mg, Oral, Q6H PRN, Spongberg, Audie Pinto, MD   [COMPLETED] vancomycin (VANCOCIN) 1,750 mg in sodium chloride 0.9 % 500 mL IVPB, 1,750 mg, Intravenous, Once, Last Rate: 250 mL/hr at 01/08/19 1636, 1,750 mg at 01/08/19 1636 **FOLLOWED BY** vancomycin (VANCOCIN) IVPB 750 mg/150 ml premix, 750 mg, Intravenous, Q12H, Wofford, Drew A, RPH, Last Rate: 150 mL/hr at 01/09/19 0945, 750 mg at 01/09/19 0945  Allergies  Allergen Reactions   Sulfa Antibiotics Hives   Tetracyclines & Related Rash   Aricept [Donepezil Hcl] Nausea And Vomiting    Vitals:   01/09/19 0601 01/09/19 1229  BP: (!) 95/46 122/86  Pulse: 91 (!) 111  Resp: 14 16  Temp: 99.2 F (37.3 C) 97.8 F (36.6 C)  SpO2: 100% 100%     Physical Exam Gen: pleasant, NAD, A&O only to person reliably Head: NCAT, mild bitemporal wasting evident EENT: PERRL, EOMI, MMM, adequate dentition Neck: supple, no JVD CV: NRRR, no murmurs evident Pulm: CTA bilaterally, no wheeze or retractions Abd: soft, NTND, +BS Extrems:  no LE edema, 2+ pulses Skin: multiple plaques along face and arms consistent with prior skin CA treatment, poor skin turgor Neuro: CN II-XII grossly intact, no focal neurologic deficits appreciated, gait was not assessed, A&O to person only reliably   Lab Results  Component Value Date   WBC 2.4 (L) 01/09/2019   HGB 7.1 (L) 01/09/2019   HCT 21.6 (L) 01/09/2019   MCV 91.9 01/09/2019   PLT 118 (L)  01/09/2019    Lab Results  Component Value Date   CREATININE 0.92 01/09/2019   BUN 16 01/09/2019   NA 138 01/09/2019   K 3.8 01/09/2019   CL 106 01/09/2019   CO2 20 (L) 01/09/2019    Lab Results  Component Value Date   ALT 21 01/07/2019   AST 22 01/07/2019   ALKPHOS 54 01/07/2019     Microbiology: Recent Results (from the past 240 hour(s))  Blood culture (routine x 2)     Status: None (Preliminary result)   Collection Time: 01/06/19  2:19 PM  Specimen: BLOOD LEFT HAND  Result Value Ref Range Status   Specimen Description   Final    BLOOD LEFT HAND Performed at [place] [address] [place], [place] [address]    Special Requests   Final    BOTTLES DRAWN AEROBIC AND ANAEROBIC Blood Culture results may not be optimal due to an inadequate volume of blood received in culture bottles Performed at [place] [address] [place], [place] [address]    Culture   Final    NO GROWTH 3 DAYS Performed at [facility], [place] [address], [place], [place] [address]    Report Status PENDING  Incomplete  Blood culture (routine x 2)     Status: None (Preliminary result)   Collection Time: 01/06/19  2:19 PM   Specimen: BLOOD  Result Value Ref Range Status   Specimen Description   Final    BLOOD RIGHT ANTECUBITAL Performed at [place] [address], [place], [place] [address]    Special Requests   Final    BOTTLES DRAWN AEROBIC AND ANAEROBIC Blood Culture adequate volume Performed at [place] [address], [place], [place] [address]    Culture   Final    NO GROWTH 3 DAYS Performed at [facility], [place] [address] [place], [place] [address]    Report Status PENDING  Incomplete  Urine culture     Status: Abnormal   Collection Time: 01/06/19  2:19 PM   Specimen: Urine, Clean Catch  Result Value Ref Range Status   Specimen Description   Final    URINE, CLEAN  CATCH Performed at [facility], [place] [address] [place], [place] [address]    Special Requests   Final    NONE Performed at [facility], [place] [address] [place], [place] [address]    Culture >=100,000 COLONIES/mL STAPHYLOCOCCUS EPIDERMIDIS (A)  Final   Report Status 01/09/2019 FINAL  Final   Organism ID, Bacteria STAPHYLOCOCCUS EPIDERMIDIS (A)  Final      Susceptibility   Staphylococcus epidermidis - MIC*    CIPROFLOXACIN 4 RESISTANT Resistant     GENTAMICIN <=0.5 SENSITIVE Sensitive     NITROFURANTOIN <=16 SENSITIVE Sensitive     OXACILLIN >=4 RESISTANT Resistant     TETRACYCLINE 2 SENSITIVE Sensitive     VANCOMYCIN 2 SENSITIVE Sensitive     TRIMETH/SULFA 160 RESISTANT Resistant     CLINDAMYCIN <=0.25 SENSITIVE Sensitive     RIFAMPIN <=0.5 SENSITIVE Sensitive     Inducible Clindamycin NEGATIVE Sensitive     LINEZOLID SENSITIVE Sensitive     * >=100,000 COLONIES/mL STAPHYLOCOCCUS EPIDERMIDIS  SARS Coronavirus 2 (CEPHEID - Performed in [place] hospital lab), Hosp Order     Status: None   Collection Time: 01/06/19  2:19 PM   Specimen: Nasopharyngeal Swab  Result Value Ref Range Status   SARS Coronavirus 2 NEGATIVE NEGATIVE Final    Comment: (NOTE) If result is NEGATIVE SARS-CoV-2 target nucleic acids are NOT DETECTED. The SARS-CoV-2 RNA is generally detectable in upper and lower  respiratory specimens during the acute phase of infection. The lowest  concentration of SARS-CoV-2 viral copies this assay can detect is 250  copies / mL. A negative result does not preclude SARS-CoV-2 infection  and should not be used as the sole basis for treatment or other  patient management decisions.  A negative result may occur with  improper specimen collection / handling,  submission of specimen other  than nasopharyngeal swab, presence of viral mutation(s) within the  areas targeted by this assay, and inadequate number of viral copies  (<250 copies /  mL). A negative result must be combined with clinical  observations, patient history, and epidemiological information. If result is POSITIVE SARS-CoV-2 target nucleic acids are DETECTED. The SARS-CoV-2 RNA is generally detectable in upper and lower  respiratory specimens dur ing the acute phase of infection.  Positive  results are indicative of active infection with SARS-CoV-2.  Clinical  correlation with patient history and other diagnostic information is  necessary to determine patient infection status.  Positive results do  not rule out bacterial infection or co-infection with other viruses. If result is PRESUMPTIVE POSTIVE SARS-CoV-2 nucleic acids MAY BE PRESENT.   A presumptive positive result was obtained on the submitted specimen  and confirmed on repeat testing.  While 2019 novel coronavirus  (SARS-CoV-2) nucleic acids may be present in the submitted sample  additional confirmatory testing may be necessary for epidemiological  and / or clinical management purposes  to differentiate between  SARS-CoV-2 and other Sarbecovirus currently known to infect humans.  If clinically indicated additional testing with an alternate test  methodology [phone]) is advised. The SARS-CoV-2 RNA is generally  detectable in upper and lower respiratory sp ecimens during the acute  phase of infection. The expected result is Negative. Fact Sheet for Patients:  StrictlyIdeas.no Fact Sheet for Healthcare Providers: BankingDealers.co.za This test is not yet approved or cleared by the [place] FDA and has been authorized for detection and/or diagnosis of SARS-CoV-2 by FDA under an Emergency Use Authorization (EUA).  This EUA will remain in effect (meaning this test can be used) for the duration of the COVID-19 declaration under Section 564(b)(1) of the Act, 21 U.S.C. section 360bbb-3(b)(1), unless the authorization is terminated or revoked  sooner. Performed at [facility], [place] [address] [place], [place] [address]   Urine culture     Status: Abnormal   Collection Time: 01/06/19  8:05 PM   Specimen: Urine, Random  Result Value Ref Range Status   Specimen Description   Final    URINE, RANDOM Performed at [place] [address] [place], [place] [address]    Special Requests   Final    NONE Performed at [facility], [place] [address] [place], [place] [address]    Culture >=100,000 COLONIES/mL STAPHYLOCOCCUS EPIDERMIDIS (A)  Final   Report Status 01/09/2019 FINAL  Final   Organism ID, Bacteria STAPHYLOCOCCUS EPIDERMIDIS (A)  Final      Susceptibility   Staphylococcus epidermidis - MIC*    CIPROFLOXACIN >=8 RESISTANT Resistant     GENTAMICIN <=0.5 SENSITIVE Sensitive     NITROFURANTOIN <=16 SENSITIVE Sensitive     OXACILLIN >=4 RESISTANT Resistant     TETRACYCLINE <=1 SENSITIVE Sensitive     VANCOMYCIN 1 SENSITIVE Sensitive     TRIMETH/SULFA 80 RESISTANT Resistant     CLINDAMYCIN RESISTANT Resistant     RIFAMPIN <=0.5 SENSITIVE Sensitive     Inducible Clindamycin POSITIVE Resistant     LINEZOLID SENSITIVE Sensitive     * >=100,000 COLONIES/mL STAPHYLOCOCCUS EPIDERMIDIS    Janine Ores, MD [place] [email or web address] 01/09/2019, 1:28 PM

## 2019-01-09 NOTE — Evaluation (Signed)
Occupational Therapy Evaluation Patient Details Name: [name] [name] MRN: [id number] DOB: 21-Dec-1940 Today's Date: 01/09/2019    History of Present Illness [name] [name] is a 78 y.o. male with medical history significant for bladder cancer status post chemo on carboplatin most recent treatment July 14 as well as radiation 35 treatments most recent treatment July 17 who presents from home with failure to thrive, diarrhea anorexia with approximately 30 pound weight loss.  Patient's past no history also includes non-insulin-dependent diabetes, hypertension, hyperlipidemia, history of subdural hematoma and multiple skin cancer status post excision.  Wife reports he has had a progressive decline over the last 1 to 2 weeks with significant decreased p.o. intake over the same time..  Patient's had bowel and bladder incontinence.  This been ongoing since stent placement in March for urinary incontinence with bladder cancer.  He also reports abdominal pain which is been difficult to characterize secondary patient's profound dementia.  Wife reports that he points to his general stomach area without a focal finding unable to verbalize more specific findings.  Wife reports he is also been too weak to walk in the hallways with what appears to be a progressive decline given his chemo radiation bladder cancer and decreased p.o. intake.   Clinical Impression   Pt admitted with sepsis. Pt currently with functional limitations due to the deficits listed below (see OT Problem List).  Pt will benefit from skilled OT to increase their safety and independence with ADL and functional mobility for ADL to facilitate discharge to venue listed below.      Follow Up Recommendations  Home health OT;Supervision/Assistance - 24 hour    Equipment Recommendations  None recommended by OT       Precautions / Restrictions   fall     Mobility Bed Mobility Overal bed mobility: Needs Assistance Bed Mobility: Supine to  Sit           General bed mobility comments: transitioned to sitting EOB with PT  Transfers Overall transfer level: Needs assistance Equipment used: 2 person hand held assist Transfers: Sit to/from [employer] Transfers Sit to Stand: Mod assist Stand pivot transfers: Mod assist;+2 safety/equipment                ADL either performed or assessed with clinical judgement   ADL Overall ADL's : Needs assistance/impaired Eating/Feeding: Set up;Sitting   Grooming: Set up;Sitting   Upper Body Bathing: Minimal assistance;Sitting   Lower Body Bathing: Moderate assistance;Sit to/from stand;Cueing for compensatory techniques;Cueing for safety   Upper Body Dressing : Minimal assistance;Sitting   Lower Body Dressing: Moderate assistance;Cueing for safety;Cueing for compensatory techniques;Sit to/from stand                 General ADL Comments: Wife A pt with all ADL activity at home.  Upon baseline pt with dementia but may be exacerbated in the hospital.     Vision Patient Visual Report: No change from baseline              Pertinent Vitals/Pain Pain Assessment: No/denies pain     Hand Dominance     Extremity/Trunk Assessment Upper Extremity Assessment Upper Extremity Assessment: Generalized weakness           Communication Communication Communication: (baseline dementia)   Cognition     Overall Cognitive Status: No family/caregiver present to determine baseline cognitive functioning  General Comments: pt with baseline dementia.  Pt confused and anxious about where he is and why.  RN brought IPAD to call wife and pt immediately calmed   General Comments               Home Living Family/patient expects to be discharged to:: Private residence Living Arrangements: Spouse/significant other Available Help at Discharge: Family Type of Home: House                                            OT Problem List: Decreased strength;Impaired balance (sitting and/or standing);Decreased safety awareness;Decreased knowledge of use of DME or AE;Decreased cognition      OT Treatment/Interventions: Self-care/ADL training;Patient/family education;DME and/or AE instruction    OT Goals(Current goals can be found in the care plan section) Acute Rehab OT Goals Patient Stated Goal: home with wife OT Goal Formulation: With patient Time For Goal Achievement: 01/17/19 Potential to Achieve Goals: Good  OT Frequency: Min 2X/week   Barriers to D/C:            Co-evaluation PT/OT/SLP Co-Evaluation/Treatment: Yes     OT goals addressed during session: ADL's and self-care      AM-PAC OT "6 Clicks" Daily Activity     Outcome Measure Help from another person eating meals?: A Little Help from another person taking care of personal grooming?: A Little Help from another person toileting, which includes using toliet, bedpan, or urinal?: A Lot Help from another person bathing (including washing, rinsing, drying)?: A Lot Help from another person to put on and taking off regular upper body clothing?: A Little Help from another person to put on and taking off regular lower body clothing?: A Lot 6 Click Score: 15   End of Session Nurse Communication: Mobility status  Activity Tolerance: Patient tolerated treatment well Patient left: in chair;with call bell/phone within reach;with nursing/sitter in room;with chair alarm set  OT Visit Diagnosis: Unsteadiness on feet (R26.81);Muscle weakness (generalized) (M62.81);Other abnormalities of gait and mobility (R26.89)                Time: 6761-9509 OT Time Calculation (min): 26 min Charges:  OT General Charges $OT Visit: 1 Visit OT Evaluation $OT Eval Moderate Complexity: 1 Mod  Kari Baars, [place] [phone] Office- [phone]     [place], Edwena Felty D 01/09/2019, 2:07 PM

## 2019-01-09 NOTE — Plan of Care (Signed)
  Problem: Acute Rehab PT Goals(only PT should resolve) Goal: Pt Will Go Supine/Side To Sit Outcome: Progressing Flowsheets (Taken 01/09/2019 1734) Pt will go Supine/Side to Sit: with supervision Goal: Patient Will Transfer Sit To/From Stand Outcome: Progressing Flowsheets (Taken 01/09/2019 1734) Patient will transfer sit to/from stand: with min guard assist Goal: Pt Will Transfer Bed To Chair/Chair To Bed Outcome: Progressing Flowsheets (Taken 01/09/2019 1734) Pt will Transfer Bed to Chair/Chair to Bed: min guard assist Goal: Pt Will Ambulate Outcome: Progressing Flowsheets (Taken 01/09/2019 1734) Pt will Ambulate:  with minimal assist  with least restrictive assistive device  50 feet

## 2019-01-09 NOTE — Care Management Important Message (Signed)
Important Message  Patient Details IM Letter given to Nancy Marus RN to present to the Patient Name: [name] [name] MRN: [id number] Date of Birth: 12-14-1940   Medicare Important Message Given:  Yes     Kerin Salen 01/09/2019, 11:07 AM

## 2019-01-09 NOTE — Progress Notes (Addendum)
PROGRESS NOTE    [name] [name]  PZW:[id number] DOB: [birth year] DOA: 01/06/2019 PCP: [name], MD   Brief Narrative:  [name] [name]:NTIRWER Ketan Renz a 78 y.o.malewith medical history significant forbladder cancer status post chemo on carboplatin most recent treatment July 14 as well as radiation 35 treatments most recent treatment July 17 who presents from home with failure to thrive, diarrhea anorexia with approximately 30 pound weight loss. [name] [name]'s past no history also includes non-insulin-dependent diabetes, hypertension, hyperlipidemia, history of subdural hematoma and multiple skin cancer status post excision. Wife reports he has had a progressive decline over the last 1 to 2 weeks with significant decreased p.o. intake over the same time.. [name] [name]'s had bowel and bladder incontinence. This been ongoing since stent placement in March for urinary incontinence with bladder cancer. He also reports abdominal pain which is been difficult to characterize secondary [name] [name]'s profound dementia. Wife reports that he points to his general stomach area without a focal finding unable to verbalize more specific findings. Wife reports he is also been too weak to walk in the hallways with what appears to be a progressive decline given his chemo radiation bladder cancer and decreased p.o. intake.   Assessment & Plan:   Principal Problem:   Severe sepsis ([place]) Active Problems:   Essential hypertension   Type 2 diabetes mellitus without complication (HCC)   Acute lower UTI   Dementia without behavioral disturbance (HCC)   Sepsis ([place])   History of subdural hematoma   Severe sepsis. Patientwas admittedunder sepsis protocol, repeat lactic acid was 1.4,he received appropriate fluid resuscitation, will titrate ns resus down adjusted abx based on cx dats, requested ID consultation 2/2 bladder stent in setting of cancer, blood cultureswhich are NTD,monitorCBC in the  morning,  Urinary tract infection in the setting of bladder cancer. Treatingwith antibiotics as noted above follow urine culture as above, [name] [name] with known bladder cancer with bladder stent placed in March 2020.  Follow CBC wbc increased to 2.4. ID CONSULT PENDING  Hypertension:Patientwasactually hypotensive but blood pressure is improving, I WILL ADD BACK IN BB BUT HOLD OTHER blood pressure medications fluid resuscitate.as above.  Non-insulin-dependent diabetes. C/W sliding scale insulin ADA diet check blood glucose AC at bedtime.  Dementia without behavioral disturbance. [name] [name] stable continue with home meds no change   DVT prophylaxis: SCD/Compression stockings  Code Status: dnr    Code Status Orders  (From admission, onward)         Start     Ordered   01/06/19 2005  Do not attempt resuscitation (DNR)  Continuous    Question Answer Comment  In the event of cardiac or respiratory ARREST Do not call a code blue   In the event of cardiac or respiratory ARREST Do not perform Intubation, CPR, defibrillation or ACLS   In the event of cardiac or respiratory ARREST Use medication by any route, position, wound care, and other measures to relive pain and suffering. May use oxygen, suction and manual treatment of airway obstruction as needed for comfort.      01/06/19 2004        Code Status History    Date Active Date Inactive Code Status Order ID Comments User Context   10/12/2013 1600 10/13/2013 1502 Full Code [id number]  Raynelle Bring, MD Inpatient   Advance Care Planning Activity     Family Communication: wife today  Disposition Plan:   Patientwill remaininpatient inthe stepdown unit. He will need continuous IV fluids, IV antibiotics-ID consultation, frequent lab checks, frequent nurse  care in the setting of urinary tract infection with severe sepsis. Without these treatments in my opinion [name] [name] at risk of severe clinical deterioration which could be  life-threatening  Consults called: None Admission status: Inpatient   Consultants:   None  Procedures:  Ct Head Wo Contrast  Result Date: 01/06/2019 CLINICAL DATA:  Altered mental status.  History of dementia. EXAM: CT HEAD WITHOUT CONTRAST TECHNIQUE: Contiguous axial images were obtained from the base of the skull through the vertex without intravenous contrast. COMPARISON:  Brain MRI, 08/08/2012 FINDINGS: Brain: No evidence of acute infarction, hemorrhage, hydrocephalus, extra-axial collection or mass lesion/mass effect. There is mild ventricular enlargement with more prominent sulcal enlargement reflecting generalized atrophy. Mild periventricular white matter hypoattenuation is noted consistent with chronic microvascular ischemic change. Vascular: No hyperdense vessel or unexpected calcification. Skull: No skull fracture.  60 old burr holes are noted on the left. Sinuses/Orbits: Globes and orbits are unremarkable. Sinuses and mastoid air cells are clear. Other: None. IMPRESSION: 1. No acute intracranial abnormalities. 2. Atrophy and mild chronic microvascular ischemic change. Electronically Signed   By: Lajean Manes M.D.   On: 01/06/2019 16:48   Ct Abdomen Pelvis W Contrast  Result Date: 01/06/2019 CLINICAL DATA:  [name] [name] with history of bladder cancer, who completed chemotherapy on 07/14 and radiation therapy on 07/17. Weakness, weight loss, change in bowel habits. EXAM: CT ABDOMEN AND PELVIS WITH CONTRAST TECHNIQUE: Multidetector CT imaging of the abdomen and pelvis was performed using the standard protocol following bolus administration of intravenous contrast. CONTRAST:  128mL OMNIPAQUE IOHEXOL 300 MG/ML  SOLN COMPARISON:  September 21, 2018 FINDINGS: Lower chest: Chronic elevation of the left hemidiaphragm. Atelectatic changes in the left lung base. Hepatobiliary: Normal appearance of the liver. Large gallbladder calculus, unchanged. No evidence of biliary ductal dilation. Pancreas:  Unremarkable. No pancreatic ductal dilatation or surrounding inflammatory changes. Spleen: Normal in size without focal abnormality. Adrenals/Urinary Tract: Nodular appearance of the left adrenal gland. Mild bilateral renal cortical thinning. No evidence of left nephrolithiasis hydronephrosis. Right ureteral stent with proximal end within the renal pelvis and distal end within the trigonal region of the urinary bladder. Mild right hydronephrosis and hydroureter. Perinephric and periureteral stranding. Mucosal thickening of the base of the urinary bladder and proximal urethra. Stomach/Bowel: Stomach is within normal limits. No evidence of bowel wall thickening, distention, or inflammatory changes. Vascular/Lymphatic: Aortic atherosclerosis, minimal. No enlarged abdominal or pelvic lymph nodes by CT criteria. Shotty retroperitoneal lymph nodes are present. Reproductive: The prostate is not enlarged. Other: No abdominal wall hernia or abnormality. No abdominopelvic ascites. Musculoskeletal: No acute or significant osseous findings. IMPRESSION: 1. Mild right hydronephrosis and hydroureter with perinephric and periureteral stranding. Right ureteral stent appropriately positioned. 2. Mucosal thickening at the base of the urinary bladder and proximal urethra. 3. Cholelithiasis without evidence of cholecystitis. Aortic Atherosclerosis (ICD10-I70.0). Electronically Signed   By: Fidela Salisbury M.D.   On: 01/06/2019 17:01   Dg Chest Port 1 View  Result Date: 01/06/2019 CLINICAL DATA:  Weakness. EXAM: PORTABLE CHEST 1 VIEW COMPARISON:  09/25/2016 FINDINGS: Lungs are adequately inflated with moderate stable elevation of the left hemidiaphragm. Lungs are otherwise clear. Cardiomediastinal silhouette and remainder of the exam is unchanged. IMPRESSION: No acute findings. Stable elevation left hemidiaphragm. Electronically Signed   By: Marin Olp M.D.   On: 01/06/2019 13:54     Antimicrobials:   Vancomycin day  2  Cefepime day 4/4 stopped today   Subjective: No acute events overnight, remains confused in the setting of  chronic dementia with confusion  Objective: Vitals:   01/08/19 2127 01/09/19 0109 01/09/19 0601 01/09/19 1229  BP: 120/77 135/72 (!) 95/46 122/86  Pulse: (!) 121 (!) 40 91 (!) 111  Resp: 20 14 14 16   Temp: 99.2 F (37.3 C) 99.5 F (37.5 C) 99.2 F (37.3 C) 97.8 F (36.6 C)  TempSrc:   Axillary Oral  SpO2: 99% 99% 100% 100%  Weight:      Height:        Intake/Output Summary (Last 24 hours) at 01/09/2019 1326 Last data filed at 01/09/2019 0600 Gross per 24 hour  Intake 1950.62 ml  Output 1300 ml  Net 650.62 ml   Filed Weights   01/06/19 1700 01/06/19 2004  Weight: 82.1 kg 75.2 kg    Examination:  General exam:Appears mildly anxious but less than yesterday,baseline confusion with known advanced chronic dementia Respiratory system: Clear to auscultation. Respiratory effort normal. Cardiovascular system:S1 &S2 heard, RRR. No JVD, murmurs, rubs, gallops or clicks. No pedal edema. Gastrointestinal system:Abdomen is nondistended, soft and nontender. No organomegaly or masses felt. Normal bowel sounds heard. Central nervous system:Alert,. No focal neurological deficits. Extremities: Symmetric 5 x 5strength, warm well perfused Skin: No rashes, lesions or ulcers Psychiatry:Judgement and insight remain impaired with chronic dementia. Mood & affect anxious.     Data Reviewed: I have personally reviewed following labs and imaging studies  CBC: Recent Labs  Lab 01/06/19 1419 01/07/19 0017 01/08/19 0227 01/09/19 0408  WBC 3.0* 2.6* 2.1* 2.4*  NEUTROABS 2.0  --  1.4* 1.5*  HGB 9.6* 8.4* 7.2* 7.1*  HCT 27.4* 24.9* 21.1* 21.6*  MCV 87.3 89.9 90.2 91.9  PLT 150 126* 111* 341*   Basic Metabolic Panel: Recent Labs  Lab 01/06/19 1419 01/07/19 0017 01/08/19 0227 01/09/19 0408  NA 136 137 137 138  K 3.2* 3.1* 3.8 3.8  CL 97* 104 104 106  CO2 27 23 18*  20*  GLUCOSE 116* 139* 200* 198*  BUN 51* 38* 26* 16  CREATININE 1.29* 1.03 0.99 0.92  CALCIUM 9.0 7.9* 7.7* 7.7*   GFR: Estimated Creatinine Clearance: 73.8 mL/min (by C-G formula based on SCr of 0.92 mg/dL). Liver Function Tests: Recent Labs  Lab 01/06/19 1419 01/07/19 0017  AST 24 22  ALT 24 21  ALKPHOS 61 54  BILITOT 1.6* 1.4*  PROT 6.9 5.9*  ALBUMIN 3.9 3.3*   No results for input(s): LIPASE, AMYLASE in the last 168 hours. No results for input(s): AMMONIA in the last 168 hours. Coagulation Profile: Recent Labs  Lab 01/07/19 0017  INR 1.2   Cardiac Enzymes: No results for input(s): CKTOTAL, CKMB, CKMBINDEX, TROPONINI in the last 168 hours. BNP (last 3 results) No results for input(s): PROBNP in the last 8760 hours. HbA1C: No results for input(s): HGBA1C in the last 72 hours. CBG: Recent Labs  Lab 01/08/19 1125 01/08/19 1646 01/08/19 2121 01/09/19 0800 01/09/19 1200  GLUCAP 152* 126* 182* 159* 193*   Lipid Profile: No results for input(s): CHOL, HDL, LDLCALC, TRIG, CHOLHDL, LDLDIRECT in the last 72 hours. Thyroid Function Tests: No results for input(s): TSH, T4TOTAL, FREET4, T3FREE, THYROIDAB in the last 72 hours. Anemia Panel: No results for input(s): VITAMINB12, FOLATE, FERRITIN, TIBC, IRON, RETICCTPCT in the last 72 hours. Sepsis Labs: Recent Labs  Lab 01/06/19 1419 01/06/19 1858 01/07/19 0017  PROCALCITON  --   --  <0.10  LATICACIDVEN 2.4* 1.4  --     Recent Results (from the past 240 hour(s))  Blood culture (routine x  2)     Status: None (Preliminary result)   Collection Time: 01/06/19  2:19 PM   Specimen: BLOOD LEFT HAND  Result Value Ref Range Status   Specimen Description   Final    BLOOD LEFT HAND Performed at [facility] [address] [place], [place] [address]    Special Requests   Final    BOTTLES DRAWN AEROBIC AND ANAEROBIC Blood Culture results may not be optimal due to an inadequate volume of blood received  in culture bottles Performed at [facility] [address], [place], [place] [address]    Culture   Final    NO GROWTH 3 DAYS Performed at [facility], [place] [address], [place], [place] [address]    Report Status PENDING  Incomplete  Blood culture (routine x 2)     Status: None (Preliminary result)   Collection Time: 01/06/19  2:19 PM   Specimen: BLOOD  Result Value Ref Range Status   Specimen Description   Final    BLOOD RIGHT ANTECUBITAL Performed at [facility] [address], [place], [place] [address]    Special Requests   Final    BOTTLES DRAWN AEROBIC AND ANAEROBIC Blood Culture adequate volume Performed at [facility] [address], [place], [place] [address]    Culture   Final    NO GROWTH 3 DAYS Performed at [facility], [place] [address], [place], [place] [address]    Report Status PENDING  Incomplete  Urine culture     Status: Abnormal   Collection Time: 01/06/19  2:19 PM   Specimen: Urine, Clean Catch  Result Value Ref Range Status   Specimen Description   Final    URINE, CLEAN CATCH Performed at [facility], [place] [address], [place], [place] [address]    Special Requests   Final    NONE Performed at [facility], [place] [address] [place], [place] [address]    Culture >=100,000 COLONIES/mL STAPHYLOCOCCUS EPIDERMIDIS (A)  Final   Report Status 01/09/2019 FINAL  Final   Organism ID, Bacteria STAPHYLOCOCCUS EPIDERMIDIS (A)  Final      Susceptibility   Staphylococcus epidermidis - MIC*    CIPROFLOXACIN 4 RESISTANT Resistant     GENTAMICIN <=0.5 SENSITIVE Sensitive     NITROFURANTOIN <=16 SENSITIVE Sensitive     OXACILLIN >=4 RESISTANT Resistant     TETRACYCLINE 2 SENSITIVE Sensitive     VANCOMYCIN 2 SENSITIVE Sensitive     TRIMETH/SULFA 160 RESISTANT Resistant     CLINDAMYCIN <=0.25 SENSITIVE Sensitive     RIFAMPIN <=0.5 SENSITIVE Sensitive      Inducible Clindamycin NEGATIVE Sensitive     LINEZOLID SENSITIVE Sensitive     * >=100,000 COLONIES/mL STAPHYLOCOCCUS EPIDERMIDIS  SARS Coronavirus 2 (CEPHEID - Performed in [place] hospital lab), Hosp Order     Status: None   Collection Time: 01/06/19  2:19 PM   Specimen: Nasopharyngeal Swab  Result Value Ref Range Status   SARS Coronavirus 2 NEGATIVE NEGATIVE Final    Comment: (NOTE) If result is NEGATIVE SARS-CoV-2 target nucleic acids are NOT DETECTED. The SARS-CoV-2 RNA is generally detectable in upper and lower  respiratory specimens during the acute phase of infection. The lowest  concentration of SARS-CoV-2 viral copies this assay can detect is 250  copies / mL. A negative result does not preclude SARS-CoV-2 infection  and should not be used as the sole basis for  treatment or other  [name] [name] management decisions.  A negative result may occur with  improper specimen collection / handling, submission of specimen other  than nasopharyngeal swab, presence of viral mutation(s) within the  areas targeted by this assay, and inadequate number of viral copies  (<250 copies / mL). A negative result must be combined with clinical  observations, [name] [name] history, and epidemiological information. If result is POSITIVE SARS-CoV-2 target nucleic acids are DETECTED. The SARS-CoV-2 RNA is generally detectable in upper and lower  respiratory specimens dur ing the acute phase of infection.  Positive  results are indicative of active infection with SARS-CoV-2.  Clinical  correlation with [name] [name] history and other diagnostic information is  necessary to determine [name] [name] infection status.  Positive results do  not rule out bacterial infection or co-infection with other viruses. If result is PRESUMPTIVE POSTIVE SARS-CoV-2 nucleic acids MAY BE PRESENT.   A presumptive positive result was obtained on the submitted specimen  and confirmed on repeat testing.  While 2019 novel coronavirus   (SARS-CoV-2) nucleic acids may be present in the submitted sample  additional confirmatory testing may be necessary for epidemiological  and / or clinical management purposes  to differentiate between  SARS-CoV-2 and other Sarbecovirus currently known to infect humans.  If clinically indicated additional testing with an alternate test  methodology [phone]) is advised. The SARS-CoV-2 RNA is generally  detectable in upper and lower respiratory sp ecimens during the acute  phase of infection. The expected result is Negative. Fact Sheet for Patients:  StrictlyIdeas.no Fact Sheet for Healthcare Providers: BankingDealers.co.za This test is not yet approved or cleared by the [place] FDA and has been authorized for detection and/or diagnosis of SARS-CoV-2 by FDA under an Emergency Use Authorization (EUA).  This EUA will remain in effect (meaning this test can be used) for the duration of the COVID-19 declaration under Section 564(b)(1) of the Act, 21 U.S.C. section 360bbb-3(b)(1), unless the authorization is terminated or revoked sooner. Performed at [facility], [place] [address] [place], [place] [address]   Urine culture     Status: Abnormal   Collection Time: 01/06/19  8:05 PM   Specimen: Urine, Random  Result Value Ref Range Status   Specimen Description   Final    URINE, RANDOM Performed at [facility] [address] [place], [place] [address]    Special Requests   Final    NONE Performed at [facility], [place] [address], [place], [place] [address]    Culture >=100,000 COLONIES/mL STAPHYLOCOCCUS EPIDERMIDIS (A)  Final   Report Status 01/09/2019 FINAL  Final   Organism ID, Bacteria STAPHYLOCOCCUS EPIDERMIDIS (A)  Final      Susceptibility   Staphylococcus epidermidis - MIC*    CIPROFLOXACIN >=8 RESISTANT Resistant     GENTAMICIN <=0.5 SENSITIVE Sensitive      NITROFURANTOIN <=16 SENSITIVE Sensitive     OXACILLIN >=4 RESISTANT Resistant     TETRACYCLINE <=1 SENSITIVE Sensitive     VANCOMYCIN 1 SENSITIVE Sensitive     TRIMETH/SULFA 80 RESISTANT Resistant     CLINDAMYCIN RESISTANT Resistant     RIFAMPIN <=0.5 SENSITIVE Sensitive     Inducible Clindamycin POSITIVE Resistant     LINEZOLID SENSITIVE Sensitive     * >=100,000 COLONIES/mL STAPHYLOCOCCUS EPIDERMIDIS         Radiology Studies: No results found.      Scheduled Meds:  Chlorhexidine Gluconate Cloth  6 each Topical Daily   dutasteride  0.5 mg Oral Daily   insulin aspart  0-15 Units Subcutaneous TID WC   insulin aspart  0-5 Units Subcutaneous QHS   memantine  28 mg Oral q morning - 10a   metoprolol succinate  25 mg Oral Daily   omega-3 acid ethyl esters  1 g Oral Daily   tamsulosin  0.4 mg Oral Daily   Continuous Infusions:  ceFEPime (MAXIPIME) IV 2 g (01/09/19 0933)   lactated ringers 75 mL/hr at 01/09/19 0352   vancomycin 750 mg (01/09/19 0945)     LOS: 3 days    Time spent: 3 min    Nicolette Bang, MD [facility]  If 7PM-7AM, please contact night-coverage  01/09/2019, 1:26 PM

## 2019-01-09 NOTE — Evaluation (Signed)
Physical Therapy Evaluation Patient Details Name: [name] [name] MRN: [id number] DOB: 04/27/1941 Today's Date: 01/09/2019   History of Present Illness  [name] [name] is a 78 y.o. male with medical history significant for bladder cancer status post chemo on carboplatin most recent treatment July 14 as well as radiation 35 treatments most recent treatment July 17 who presents from home with failure to thrive, diarrhea anorexia with approximately 30 pound weight loss.  Patient's past no history also includes non-insulin-dependent diabetes, hypertension, hyperlipidemia, history of subdural hematoma and multiple skin cancer status post excision.  Wife reports he has had a progressive decline over the last 1 to 2 weeks with significant decreased p.o. intake over the same time..  Patient's had bowel and bladder incontinence.  This been ongoing since stent placement in March for urinary incontinence with bladder cancer.  He also reports abdominal pain which is been difficult to characterize secondary patient's profound dementia.  Wife reports that he points to his general stomach area without a focal finding unable to verbalize more specific findings.  Wife reports he is also been too weak to walk in the hallways with what appears to be a progressive decline given his chemo radiation bladder cancer and decreased p.o. intake.(per progress note 01/09/19 by Marcell Anger, MD)    Clinical Impression  [name] is a 78 y.o. male admitted with diagnosis of sepsis and above HPI. He and his wife reports patient was independent with mobility with no device prior to this admission. He currently requires min assist for bed mobilty and min to mod assist for functional transfers and gait with 2 person hand held. He will benefit from additional skilled PT at venue below to improve his safety and independence with functional mobility and gait. Patient's spouse has expressed preference for patient to  return home with [facility] services if he progresses well enough. Acute PT will follow and progress mobility as able.    Follow Up Recommendations SNF    Equipment Recommendations  None recommended by PT    Recommendations for Other Services       Precautions / Restrictions Precautions Precautions: Fall Restrictions Weight Bearing Restrictions: No      Mobility  Bed Mobility Overal bed mobility: Needs Assistance Bed Mobility: Supine to Sit     Supine to sit: Min assist     General bed mobility comments: pt scooted LE's off the EOB and then able to sit up with cues to press up on elbows and push up from bed rail, min assist required to press up and scoot forward  Transfers Overall transfer level: Needs assistance Equipment used: 2 person hand held assist Transfers: Sit to/from [facility] Sit to Stand: Mod assist Stand pivot transfers: Mod assist;+2 safety/equipment       General transfer comment: pt required 2 person hand held assist to prevent LOB and cue to sequence steps/turn to sit in bedside recliner  Ambulation/Gait                Stairs            Wheelchair Mobility    Modified Rankin (Stroke Patients Only)       Balance Overall balance assessment: Needs assistance Sitting-balance support: No upper extremity supported;Feet supported Sitting balance-Leahy Scale: Good Sitting balance - Comments: pt able to don socks at EOB with min guard   Standing balance support: Bilateral upper extremity supported;During functional activity Standing balance-Leahy Scale: Fair Standing balance comment: pt requiring UE  assist with hand held assist from x2 for sit to stand transfer and to maintain standing balance                             Pertinent Vitals/Pain Pain Assessment: No/denies pain    Home Living Family/patient expects to be discharged to:: Private residence Living Arrangements: Spouse/significant other Available  Help at Discharge: Family Type of Home: House Home Access: Stairs to enter Entrance Stairs-Rails: None;Left;Right(side has 1 rail, and 2 rails at back) [employer] of Steps: 3 at front (no rails), 5 at side ( 1 hand rail and the wall), 7 at back (2 hand rails) Home Layout: One level Home Equipment: [place] - 2 wheels;Shower seat;Grab bars - tub/shower;Grab bars - toilet      Prior Function Level of Independence: Independent         Comments: spoke with pt's wife and she reports he has been very independent until this developed. reports chemo treatment have made him weaker but he was mobilizing in home with no device until he had to come in for current episode. reports he had 3 falls recently that occured when he walk ambulating to bathroom as he would rush to get there.     Hand Dominance        Extremity/Trunk Assessment   Upper Extremity Assessment Upper Extremity Assessment: Defer to OT evaluation    Lower Extremity Assessment Lower Extremity Assessment: Generalized weakness    Cervical / Trunk Assessment Cervical / Trunk Assessment: Kyphotic  Communication   Communication: (baseline dementia)  Cognition Arousal/Alertness: Awake/alert Behavior During Therapy: WFL for tasks assessed/performed Overall Cognitive Status: No family/caregiver present to determine baseline cognitive functioning(discussed pt's baseline with wife and reports he is confused and has memory deficits at baseline but it is worsened by new environments.)                                 General Comments: pt with baseline dementia.  Pt confused and anxious about where he is and why. Asked questions repeatedly thorughout session. RN brought IPAD to call wife and pt immediately calmed down. He became tearful after hanging up and stated "thank you you don't know what that meant to me"      General Comments      Exercises     Assessment/Plan    PT Assessment Patient needs  continued PT services  PT Problem List Decreased strength;Decreased balance;Decreased mobility;Decreased activity tolerance;Decreased safety awareness       PT Treatment Interventions DME instruction;Gait training;Therapeutic activities;Patient/family education;Balance training;Functional mobility training;Therapeutic exercise;Stair training    PT Goals (Current goals can be found in the Care Plan section)  Acute Rehab PT Goals Patient Stated Goal: home with wife PT Goal Formulation: With patient Time For Goal Achievement: 01/16/19 Potential to Achieve Goals: Good    Frequency Min 3X/week   Barriers to discharge        Co-evaluation PT/OT/SLP Co-Evaluation/Treatment: Yes Reason for Co-Treatment: Complexity of the patient's impairments (multi-system involvement);For patient/therapist safety;To address functional/ADL transfers PT goals addressed during session: Mobility/safety with mobility;Balance OT goals addressed during session: ADL's and self-care       AM-PAC PT "6 Clicks" Mobility  Outcome Measure Help needed turning from your back to your side while in a flat bed without using bedrails?: A Little Help needed moving from lying on your back to sitting on the  side of a flat bed without using bedrails?: A Little Help needed moving to and from a bed to a chair (including a wheelchair)?: A Lot Help needed standing up from a chair using your arms (e.g., wheelchair or bedside chair)?: A Little Help needed to walk in hospital room?: A Lot Help needed climbing 3-5 steps with a railing? : A Lot 6 Click Score: 15    End of Session Equipment Utilized During Treatment: Gait belt Activity Tolerance: Patient tolerated treatment well Patient left: in chair;with call bell/phone within reach;with chair alarm set Nurse Communication: Mobility status PT Visit Diagnosis: Unsteadiness on feet (R26.81);Other abnormalities of gait and mobility (R26.89);Difficulty in walking, not elsewhere  classified (R26.2)    Time: 3151-7616 PT Time Calculation (min) (ACUTE ONLY): 28 min   Charges:   PT Evaluation $PT Eval Moderate Complexity: 1 Mod PT Treatments $Therapeutic Activity: 8-22 mins       Kipp Brood, PT, DPT, [facility] Physical Therapist with [facility]  01/09/2019 5:34 PM

## 2019-01-10 ENCOUNTER — Inpatient Hospital Stay (HOSPITAL_COMMUNITY): Payer: Medicare Other

## 2019-01-10 DIAGNOSIS — M7989 Other specified soft tissue disorders: Secondary | ICD-10-CM

## 2019-01-10 DIAGNOSIS — F0390 Unspecified dementia without behavioral disturbance: Secondary | ICD-10-CM

## 2019-01-10 DIAGNOSIS — Z85828 Personal history of other malignant neoplasm of skin: Secondary | ICD-10-CM

## 2019-01-10 DIAGNOSIS — R197 Diarrhea, unspecified: Secondary | ICD-10-CM

## 2019-01-10 LAB — BASIC METABOLIC PANEL
Anion gap: 7 (ref 5–15)
BUN: 12 mg/dL (ref 8–23)
CO2: 21 mmol/L — ABNORMAL LOW (ref 22–32)
Calcium: 7.8 mg/dL — ABNORMAL LOW (ref 8.9–10.3)
Chloride: 108 mmol/L (ref 98–111)
Creatinine, Ser: 0.84 mg/dL (ref 0.61–1.24)
GFR calc Af Amer: >60 mL/min (ref >60)
GFR calc non Af Amer: >60 mL/min (ref >60)
Glucose, Bld: 210 mg/dL — ABNORMAL HIGH (ref 70–99)
Potassium: 3.2 mmol/L — ABNORMAL LOW (ref 3.5–5.1)
Sodium: 136 mmol/L (ref 135–145)

## 2019-01-10 LAB — CBC WITH DIFFERENTIAL/PLATELET
Abs Immature Granulocytes: 0.07 10*3/uL (ref 0.00–0.07)
Basophils Absolute: 0 10*3/uL (ref 0.0–0.1)
Basophils Relative: 0 %
Eosinophils Absolute: 0 10*3/uL (ref 0.0–0.5)
Eosinophils Relative: 0 %
HCT: 22.6 % — ABNORMAL LOW (ref 39.0–52.0)
Hemoglobin: 7.7 g/dL — ABNORMAL LOW (ref 13.0–17.0)
Immature Granulocytes: 3 %
Lymphocytes Relative: 14 %
Lymphs Abs: 0.3 10*3/uL — ABNORMAL LOW (ref 0.7–4.0)
MCH: 31 pg (ref 26.0–34.0)
MCHC: 34.1 g/dL (ref 30.0–36.0)
MCV: 91.1 fL (ref 80.0–100.0)
Monocytes Absolute: 0.5 10*3/uL (ref 0.1–1.0)
Monocytes Relative: 18 %
Neutro Abs: 1.6 10*3/uL — ABNORMAL LOW (ref 1.7–7.7)
Neutrophils Relative %: 65 %
Platelets: 121 10*3/uL — ABNORMAL LOW (ref 150–400)
RBC: 2.48 MIL/uL — ABNORMAL LOW (ref 4.22–5.81)
RDW: 22.1 % — ABNORMAL HIGH (ref 11.5–15.5)
WBC: 2.5 10*3/uL — ABNORMAL LOW (ref 4.0–10.5)
nRBC: 0 % (ref 0.0–0.2)

## 2019-01-10 LAB — GLUCOSE, CAPILLARY
Glucose-Capillary: 189 mg/dL — ABNORMAL HIGH (ref 70–99)
Glucose-Capillary: 199 mg/dL — ABNORMAL HIGH (ref 70–99)

## 2019-01-10 MED ORDER — LINEZOLID 600 MG PO TABS: 600.0000 mg | ORAL_TABLET | Freq: Two times a day (BID) | ORAL | 0 refills | Status: AC

## 2019-01-10 NOTE — Progress Notes (Signed)
Pharmacy - Antimicrobial Stewardship  Patient to discharge on linezolid for MRSE UTI  [facility] and [facility] copay is $5   Doreene Eland, PharmD, BCPS.   Work Cell: [phone] 01/10/2019 11:58 AM

## 2019-01-10 NOTE — Progress Notes (Signed)
[facility] for Infectious Disease   Reason for visit: Follow up on MR-CoNS UTI/sepsis  Antibiotics: Vancomycin, day 3  Interval History: No change per nursing. Appetite remains poor. Pt is anxious to go home later today. Fever curve, WBC trends, ABX usage, and imaging all  independently reviewed    Current Facility-Administered Medications:    acetaminophen (TYLENOL) tablet 650 mg, 650 mg, Oral, Q6H PRN, 650 mg at 01/08/19 2120 **OR** acetaminophen (TYLENOL) suppository 650 mg, 650 mg, Rectal, Q6H PRN, Spongberg, Audie Pinto, MD   Chlorhexidine Gluconate Cloth 2 % PADS 6 each, 6 each, Topical, Daily, Spongberg, Audie Pinto, MD, 6 each at 01/08/19 1025   dutasteride (AVODART) capsule 0.5 mg, 0.5 mg, Oral, Daily, Spongberg, Audie Pinto, MD, 0.5 mg at 01/10/19 0908   insulin aspart (novoLOG) injection 0-15 Units, 0-15 Units, Subcutaneous, TID WC, Spongberg, Audie Pinto, MD, 3 Units at 01/10/19 0910   insulin aspart (novoLOG) injection 0-5 Units, 0-5 Units, Subcutaneous, QHS, Spongberg, Audie Pinto, MD   ketorolac (TORADOL) 15 MG/ML injection 15 mg, 15 mg, Intravenous, Q6H PRN, Marcell Anger, MD, 15 mg at 01/08/19 0026   lactated ringers infusion, , Intravenous, Continuous, Spongberg, Audie Pinto, MD, Last Rate: 75 mL/hr at 01/10/19 0857   memantine (NAMENDA XR) 24 hr capsule 28 mg, 28 mg, Oral, q morning - 10a, Spongberg, Audie Pinto, MD, 28 mg at 01/10/19 0910   metoprolol succinate (TOPROL-XL) 24 hr tablet 25 mg, 25 mg, Oral, Daily, Spongberg, Audie Pinto, MD, 25 mg at 01/10/19 6384   omega-3 acid ethyl esters (LOVAZA) capsule 1 g, 1 g, Oral, Daily, Spongberg, Audie Pinto, MD, 1 g at 01/10/19 0910   ondansetron (ZOFRAN) tablet 4 mg, 4 mg, Oral, Q6H PRN **OR** ondansetron (ZOFRAN) injection 4 mg, 4 mg, Intravenous, Q6H PRN, Spongberg, Audie Pinto, MD   tamsulosin (FLOMAX) capsule 0.4 mg, 0.4 mg, Oral, Daily, Spongberg, Audie Pinto, MD, 0.4  mg at 01/10/19 0910   traMADol (ULTRAM) tablet 50 mg, 50 mg, Oral, Q6H PRN, Spongberg, Audie Pinto, MD   [COMPLETED] vancomycin (VANCOCIN) 1,750 mg in sodium chloride 0.9 % 500 mL IVPB, 1,750 mg, Intravenous, Once, Last Rate: 250 mL/hr at 01/08/19 1636, 1,750 mg at 01/08/19 1636 **FOLLOWED BY** vancomycin (VANCOCIN) IVPB 750 mg/150 ml premix, 750 mg, Intravenous, Q12H, Polly Cobia, RPH, Last Rate: 150 mL/hr at 01/10/19 0859, 750 mg at 01/10/19 0859   Physical Exam:   Vitals:   01/09/19 2103 01/10/19 0642  BP: 114/61 125/72  Pulse: (!) 112 93  Resp: 17 18  Temp: 98.1 F (36.7 C) 98.7 F (37.1 C)  SpO2: 99% 100%   Physical Exam Gen: pleasant, NAD, A&O only to person reliably Head: NCAT, mild bitemporal wasting evident EENT: PERRL, EOMI, MMM, adequate dentition Neck: supple, no JVD CV: NRRR, no murmurs evident Pulm: CTA bilaterally, no wheeze or retractions Abd: soft, NTND, +BS Extrems:  no LE edema, 2+ pulses Skin: multiple plaques along face and arms consistent with prior skin CA treatment, poor skin turgor Neuro: CN II-XII grossly intact, no focal neurologic deficits appreciated, gait was not assessed, A&O to person only reliably  Review of Systems:  Review of Systems  Constitutional: Positive for malaise/fatigue and weight loss. Negative for chills and fever.  HENT: Negative for congestion, hearing loss, sinus pain and sore throat.   Eyes: Negative for blurred vision, photophobia and discharge.  Respiratory: Negative for cough, hemoptysis and shortness of breath.   Cardiovascular: Negative for chest pain, palpitations, orthopnea and leg  swelling.  Gastrointestinal: Positive for diarrhea and nausea. Negative for abdominal pain, constipation, heartburn and vomiting.  Genitourinary: Negative for dysuria, flank pain, frequency and urgency.  Musculoskeletal: Negative for back pain, joint pain and myalgias.  Skin: Negative for itching and rash.  Neurological: Positive for  weakness. Negative for tremors, seizures and headaches.  Endo/Heme/Allergies: Negative for polydipsia. Does not bruise/bleed easily.  Psychiatric/Behavioral: Positive for memory loss. Negative for depression and substance abuse. The patient is not nervous/anxious and does not have insomnia.      Lab Results  Component Value Date   WBC 2.5 (L) 01/10/2019   HGB 7.7 (L) 01/10/2019   HCT 22.6 (L) 01/10/2019   MCV 91.1 01/10/2019   PLT 121 (L) 01/10/2019    Lab Results  Component Value Date   CREATININE 0.84 01/10/2019   BUN 12 01/10/2019   NA 136 01/10/2019   K 3.2 (L) 01/10/2019   CL 108 01/10/2019   CO2 21 (L) 01/10/2019    Lab Results  Component Value Date   ALT 21 01/07/2019   AST 22 01/07/2019   ALKPHOS 54 01/07/2019     Microbiology: Recent Results (from the past 240 hour(s))  Blood culture (routine x 2)     Status: None (Preliminary result)   Collection Time: 01/06/19  2:19 PM   Specimen: BLOOD LEFT HAND  Result Value Ref Range Status   Specimen Description   Final    BLOOD LEFT HAND Performed at [facility], [place] [address] [place], [place] [address]    Special Requests   Final    BOTTLES DRAWN AEROBIC AND ANAEROBIC Blood Culture results may not be optimal due to an inadequate volume of blood received in culture bottles Performed at [place] [address], [place], [place] [address]    Culture   Final    NO GROWTH 4 DAYS Performed at [facility], [place] [address] [place], [place] [address]    Report Status PENDING  Incomplete  Blood culture (routine x 2)     Status: None (Preliminary result)   Collection Time: 01/06/19  2:19 PM   Specimen: BLOOD  Result Value Ref Range Status   Specimen Description   Final    BLOOD RIGHT ANTECUBITAL Performed at [place] [address] [place], [place] [address]    Special Requests   Final    BOTTLES DRAWN AEROBIC AND ANAEROBIC Blood Culture  adequate volume Performed at [place] [address] [place], [place] [address]    Culture   Final    NO GROWTH 4 DAYS Performed at [facility], [place] [address] [place], [place] [address]    Report Status PENDING  Incomplete  Urine culture     Status: Abnormal   Collection Time: 01/06/19  2:19 PM   Specimen: Urine, Clean Catch  Result Value Ref Range Status   Specimen Description   Final    URINE, CLEAN CATCH Performed at [facility], [place] [address], [place], [place] [address]    Special Requests   Final    NONE Performed at [facility], [place] [address], [place],  [address]    Culture >=100,000 COLONIES/mL STAPHYLOCOCCUS EPIDERMIDIS (A)  Final   Report Status 01/09/2019 FINAL  Final   Organism ID, Bacteria STAPHYLOCOCCUS EPIDERMIDIS (A)  Final      Susceptibility   Staphylococcus epidermidis - MIC*    CIPROFLOXACIN 4 RESISTANT Resistant  GENTAMICIN <=0.5 SENSITIVE Sensitive     NITROFURANTOIN <=16 SENSITIVE Sensitive     OXACILLIN >=4 RESISTANT Resistant     TETRACYCLINE 2 SENSITIVE Sensitive     VANCOMYCIN 2 SENSITIVE Sensitive     TRIMETH/SULFA 160 RESISTANT Resistant     CLINDAMYCIN <=0.25 SENSITIVE Sensitive     RIFAMPIN <=0.5 SENSITIVE Sensitive     Inducible Clindamycin NEGATIVE Sensitive     LINEZOLID SENSITIVE Sensitive     * >=100,000 COLONIES/mL STAPHYLOCOCCUS EPIDERMIDIS  SARS Coronavirus 2 (CEPHEID - Performed in [facility] hospital lab), Hosp Order     Status: None   Collection Time: 01/06/19  2:19 PM   Specimen: Nasopharyngeal Swab  Result Value Ref Range Status   SARS Coronavirus 2 NEGATIVE NEGATIVE Final    Comment: (NOTE) If result is NEGATIVE SARS-CoV-2 target nucleic acids are NOT DETECTED. The SARS-CoV-2 RNA is generally detectable in upper and lower  respiratory specimens during the acute phase of infection. The lowest  concentration of SARS-CoV-2 viral copies this  assay can detect is 250  copies / mL. A negative result does not preclude SARS-CoV-2 infection  and should not be used as the sole basis for treatment or other  patient management decisions.  A negative result may occur with  improper specimen collection / handling, submission of specimen other  than nasopharyngeal swab, presence of viral mutation(s) within the  areas targeted by this assay, and inadequate number of viral copies  (<250 copies / mL). A negative result must be combined with clinical  observations, patient history, and epidemiological information. If result is POSITIVE SARS-CoV-2 target nucleic acids are DETECTED. The SARS-CoV-2 RNA is generally detectable in upper and lower  respiratory specimens dur ing the acute phase of infection.  Positive  results are indicative of active infection with SARS-CoV-2.  Clinical  correlation with patient history and other diagnostic information is  necessary to determine patient infection status.  Positive results do  not rule out bacterial infection or co-infection with other viruses. If result is PRESUMPTIVE POSTIVE SARS-CoV-2 nucleic acids MAY BE PRESENT.   A presumptive positive result was obtained on the submitted specimen  and confirmed on repeat testing.  While 2019 novel coronavirus  (SARS-CoV-2) nucleic acids may be present in the submitted sample  additional confirmatory testing may be necessary for epidemiological  and / or clinical management purposes  to differentiate between  SARS-CoV-2 and other Sarbecovirus currently known to infect humans.  If clinically indicated additional testing with an alternate test  methodology [phone]) is advised. The SARS-CoV-2 RNA is generally  detectable in upper and lower respiratory sp ecimens during the acute  phase of infection. The expected result is Negative. Fact Sheet for Patients:  StrictlyIdeas.no Fact Sheet for Healthcare  Providers: BankingDealers.co.za This test is not yet approved or cleared by the [place] FDA and has been authorized for detection and/or diagnosis of SARS-CoV-2 by FDA under an Emergency Use Authorization (EUA).  This EUA will remain in effect (meaning this test can be used) for the duration of the COVID-19 declaration under Section 564(b)(1) of the Act, 21 U.S.C. section 360bbb-3(b)(1), unless the authorization is terminated or revoked sooner. Performed at [facility], [place] [address] [place], [place] [address]   Urine culture     Status: Abnormal   Collection Time: 01/06/19  8:05 PM   Specimen: Urine, Random  Result Value Ref Range Status   Specimen Description   Final    URINE, RANDOM Performed at [facility]  [place] [address] [place], [place] [address]    Special Requests   Final    NONE Performed at [facility], [place] [address], [place], [place] [address]    Culture >=100,000 COLONIES/mL STAPHYLOCOCCUS EPIDERMIDIS (A)  Final   Report Status 01/09/2019 FINAL  Final   Organism ID, Bacteria STAPHYLOCOCCUS EPIDERMIDIS (A)  Final      Susceptibility   Staphylococcus epidermidis - MIC*    CIPROFLOXACIN >=8 RESISTANT Resistant     GENTAMICIN <=0.5 SENSITIVE Sensitive     NITROFURANTOIN <=16 SENSITIVE Sensitive     OXACILLIN >=4 RESISTANT Resistant     TETRACYCLINE <=1 SENSITIVE Sensitive     VANCOMYCIN 1 SENSITIVE Sensitive     TRIMETH/SULFA 80 RESISTANT Resistant     CLINDAMYCIN RESISTANT Resistant     RIFAMPIN <=0.5 SENSITIVE Sensitive     Inducible Clindamycin POSITIVE Resistant     LINEZOLID SENSITIVE Sensitive     * >=100,000 COLONIES/mL STAPHYLOCOCCUS EPIDERMIDIS    Impression/Plan: The patient is a 78 y/o WM with advanced dementia, active bladder CA causing obstructive uropathy, s/p ureteral stent placement and receiving active chemo/XRT admitted with lethargy/hypOTN and MR-CoNS  UTI.  1. MR-CoNS UTI - Discussed sensitivities with micro lab this AM given cnflicting MICs for some ABX on panel. Given ureteral stent, concern remains that this may be serving a sthe nidus of his most recent infection. Given his bladder CA and known hydronephrosis, I am unclear this can safely be exchanged. Continue vancomycin dosed for a goal trough of 15-20 while he remains an inpt. Due to his lactic acidosis and initial hypOTN, concern for true sepsis despite negative blood cxs does exist. Plan to transition the patient to PO zyvox 600 mg BID at the time of hospital d/c to complete 2 weeks of treatment in total. Tentative ABX d/c date is 01/21/2019. ID pharmacist confirms the patient's co-pay for his zyvox will be $5. His platelet count is low but suspect this is due to his recent chemotherapy and should hopefully improve. Recommend that his PCP recheck a CBC as an OP as his treatment nears an end though. D/c'ed cefepime.  2. Bladder CA - Dx;ed in 2013. Recent ureteral stent placed earlier this year with both subsequent XRT and chemo given. Would suspect both of these modalities are palliative given his severe dementia. If his infections return, a serious discussion re: goals of care should be pursued with his wife as he does not appear competent to make this decision for himself (he has difficulty remembering statements from even 3 minutes prior and this does not appear to be due to sepsis as he has stabilized from that perspective while here). Would hold both chemo and XRT until ABX are complete.  3. Pancytopenia - presumably due to his recent chemotherapy. See remarks above re: zyvox but view this as safer treatment option as this an orally bioequivalent drug (I.e. will treat sepsis) and would hope his thrombocytopenia will improve the further he is from his chemo. Have PCP check a CBC as his zyvox course nears an end.

## 2019-01-10 NOTE — Discharge Summary (Signed)
Physician Discharge Summary  [name] [name] YQI:[id number] DOB: 26-May-1941 DOA: 01/06/2019  PCP: [name], MD  Admit date: 01/06/2019 Discharge date: 01/10/2019  Admitted From: Inpatient Disposition: home  Recommendations for Outpatient Follow-up:  1. Follow up with PCP in 1-2 weeks   Home Health:No Equipment/Devices:NO NEW EQUIP  Discharge Condition:Fair CODE STATUS:DNR Diet recommendation: Diabetic diet  Brief/Interim Summary: LOV:FIEPPIR [name] Partridge Smithis a 77 y.o.malewith medical history significant forbladder cancer status post chemo on carboplatin most recent treatment July 14 as well as radiation 35 treatments most recent treatment July 17 who presents from home with failure to thrive, diarrhea anorexia with approximately 30 pound weight loss. Patient's past no history also includes non-insulin-dependent diabetes, hypertension, hyperlipidemia, history of subdural hematoma and multiple skin cancer status post excision. Wife reports he has had a progressive decline over the last 1 to 2 weeks with significant decreased p.o. intake over the same time.. Patient's had bowel and bladder incontinence. This been ongoing since stent placement in March for urinary incontinence with bladder cancer. He also reports abdominal pain which is been difficult to characterize secondary patient's profound dementia. Wife reports that he points to his general stomach area without a focal finding unable to verbalize more specific findings. Wife reports he is also been too weak to walk in the hallways with what appears to be a progressive decline given his chemo radiation bladder cancer and decreased p.o. intake.  Hospital course: Severe sepsis. Patientwas admittedunder sepsis protocol, repeat lactic acid was 1.4,he received appropriate fluid resuscitation,titrated ns resus downadjusted abx based on cx dats, requested ID consultation 2/2 bladder stent in setting of  cancer-recommended a total of two weeks of abx with linezolid in transition to outpt blood cultureswhich areNTD,monitoredCBC in the morning, wbc increasing 2.5  Urinary tract infection in the setting of bladder cancer. Treatingwith antibiotics as noted above follow urine culture as above, patient with known bladder cancer with bladder stent placed in March 2020.Follow CBC wbc increased to 2.4. ID CONSUL appreciated  Hypertension:Patientwasactually hypotensive initiallubut blood pressure is improving,he can resume home meds on discharge  Non-insulin-dependent diabetes. while inpatient he was treated with sliding scale insulin ADA diet checking blood glucose AC at bedtime.  We will resume his home medications and home diet on discharge.  Dementia without behavioral disturbance. Patient stable continue with home meds no change, discussed with wife prognosis and progressive decline associated with dementia as a contributory factor to patient's decline in energy and declining p.o. intake.  Patient high risk of readmission given his progressive decline with the increasing burden this will place on family.  They have elected to take the patient home for continued care at home.   Discharge Diagnoses:  Principal Problem:   Severe sepsis ([place]) Active Problems:   Essential hypertension   Type 2 diabetes mellitus without complication (HCC)   Acute lower UTI   Dementia without behavioral disturbance (HCC)   Sepsis ([place])   History of subdural hematoma    Discharge Instructions  Discharge Instructions    Call MD for:  difficulty breathing, headache or visual disturbances   Complete by: As directed    Call MD for:  hives   Complete by: As directed    Call MD for:  persistant dizziness or light-headedness   Complete by: As directed    Call MD for:  persistant nausea and vomiting   Complete by: As directed    Call MD for:  redness, tenderness, or signs of infection (pain,  swelling, redness, odor or green/yellow  discharge around incision site)   Complete by: As directed    Call MD for:  severe uncontrolled pain   Complete by: As directed    Call MD for:  temperature >100.4   Complete by: As directed    Diet Carb Modified   Complete by: As directed    Increase activity slowly   Complete by: As directed      Allergies as of 01/10/2019      Reactions   Sulfa Antibiotics Hives   Tetracyclines & Related Rash   Aricept [donepezil Hcl] Nausea And Vomiting      Medication List    TAKE these medications   dutasteride 0.5 MG capsule Commonly known as: AVODART Take 0.5 mg by mouth daily.   Fish Oil 1200 MG Caps Take 1,200 mg by mouth every evening.   glipiZIDE 5 MG tablet Commonly known as: GLUCOTROL Take 2.5 mg by mouth daily before breakfast.   hydrochlorothiazide 25 MG tablet Commonly known as: HYDRODIURIL Take 25 mg by mouth daily.   linezolid 600 MG tablet Commonly known as: ZYVOX Take 1 tablet (600 mg total) by mouth 2 (two) times daily for 11 days.   memantine 28 MG Cp24 24 hr capsule Commonly known as: NAMENDA XR Take 28 mg by mouth every morning.   metFORMIN 500 MG tablet Commonly known as: GLUCOPHAGE Take 1,000 mg by mouth 2 (two) times daily with a meal.   metoprolol succinate 25 MG 24 hr tablet Commonly known as: TOPROL-XL Take 25 mg by mouth daily.   multivitamin with minerals Tabs tablet Take 1 tablet by mouth every evening.   OneTouch Delica Lancets 32I Misc 2 (two) times daily. as directed   Onglyza 5 MG Tabs tablet Generic drug: saxagliptin HCl Take 5 mg by mouth daily.   perindopril 4 MG tablet Commonly known as: ACEON Take 4 mg by mouth daily.   prochlorperazine 10 MG tablet Commonly known as: COMPAZINE Take 1 tablet (10 mg total) by mouth every 6 (six) hours as needed for nausea or vomiting.   vitamin B-12 500 MCG tablet Commonly known as: CYANOCOBALAMIN Take 500 mcg by mouth every evening.        Allergies  Allergen Reactions  . Sulfa Antibiotics Hives  . Tetracyclines & Related Rash  . Aricept [Donepezil Hcl] Nausea And Vomiting    Consultations:  ID   Procedures/Studies: Ct Head Wo Contrast  Result Date: 01/06/2019 CLINICAL DATA:  Altered mental status.  History of dementia. EXAM: CT HEAD WITHOUT CONTRAST TECHNIQUE: Contiguous axial images were obtained from the base of the skull through the vertex without intravenous contrast. COMPARISON:  Brain MRI, 08/08/2012 FINDINGS: Brain: No evidence of acute infarction, hemorrhage, hydrocephalus, extra-axial collection or mass lesion/mass effect. There is mild ventricular enlargement with more prominent sulcal enlargement reflecting generalized atrophy. Mild periventricular white matter hypoattenuation is noted consistent with chronic microvascular ischemic change. Vascular: No hyperdense vessel or unexpected calcification. Skull: No skull fracture.  76 old burr holes are noted on the left. Sinuses/Orbits: Globes and orbits are unremarkable. Sinuses and mastoid air cells are clear. Other: None. IMPRESSION: 1. No acute intracranial abnormalities. 2. Atrophy and mild chronic microvascular ischemic change. Electronically Signed   By: Lajean Manes M.D.   On: 01/06/2019 16:48   Ct Abdomen Pelvis W Contrast  Result Date: 01/06/2019 CLINICAL DATA:  Patient with history of bladder cancer, who completed chemotherapy on 07/14 and radiation therapy on 07/17. Weakness, weight loss, change in bowel habits. EXAM: CT ABDOMEN AND  PELVIS WITH CONTRAST TECHNIQUE: Multidetector CT imaging of the abdomen and pelvis was performed using the standard protocol following bolus administration of intravenous contrast. CONTRAST:  153mL OMNIPAQUE IOHEXOL 300 MG/ML  SOLN COMPARISON:  September 21, 2018 FINDINGS: Lower chest: Chronic elevation of the left hemidiaphragm. Atelectatic changes in the left lung base. Hepatobiliary: Normal appearance of the liver. Large  gallbladder calculus, unchanged. No evidence of biliary ductal dilation. Pancreas: Unremarkable. No pancreatic ductal dilatation or surrounding inflammatory changes. Spleen: Normal in size without focal abnormality. Adrenals/Urinary Tract: Nodular appearance of the left adrenal gland. Mild bilateral renal cortical thinning. No evidence of left nephrolithiasis hydronephrosis. Right ureteral stent with proximal end within the renal pelvis and distal end within the trigonal region of the urinary bladder. Mild right hydronephrosis and hydroureter. Perinephric and periureteral stranding. Mucosal thickening of the base of the urinary bladder and proximal urethra. Stomach/Bowel: Stomach is within normal limits. No evidence of bowel wall thickening, distention, or inflammatory changes. Vascular/Lymphatic: Aortic atherosclerosis, minimal. No enlarged abdominal or pelvic lymph nodes by CT criteria. Shotty retroperitoneal lymph nodes are present. Reproductive: The prostate is not enlarged. Other: No abdominal wall hernia or abnormality. No abdominopelvic ascites. Musculoskeletal: No acute or significant osseous findings. IMPRESSION: 1. Mild right hydronephrosis and hydroureter with perinephric and periureteral stranding. Right ureteral stent appropriately positioned. 2. Mucosal thickening at the base of the urinary bladder and proximal urethra. 3. Cholelithiasis without evidence of cholecystitis. Aortic Atherosclerosis (ICD10-I70.0). Electronically Signed   By: Fidela Salisbury M.D.   On: 01/06/2019 17:01   Dg Chest Port 1 View  Result Date: 01/06/2019 CLINICAL DATA:  Weakness. EXAM: PORTABLE CHEST 1 VIEW COMPARISON:  09/25/2016 FINDINGS: Lungs are adequately inflated with moderate stable elevation of the left hemidiaphragm. Lungs are otherwise clear. Cardiomediastinal silhouette and remainder of the exam is unchanged. IMPRESSION: No acute findings. Stable elevation left hemidiaphragm. Electronically Signed   By: Marin Olp M.D.   On: 01/06/2019 13:54       Subjective: No acute events overnight.  Resting comfortably in bed Was excited about going home   Discharge Exam: Vitals:   01/09/19 2103 01/10/19 0642  BP: 114/61 125/72  Pulse: (!) 112 93  Resp: 17 18  Temp: 98.1 F (36.7 C) 98.7 F (37.1 C)  SpO2: 99% 100%   Vitals:   01/09/19 0601 01/09/19 1229 01/09/19 2103 01/10/19 0642  BP: (!) 95/46 122/86 114/61 125/72  Pulse: 91 (!) 111 (!) 112 93  Resp: 14 16 17 18   Temp: 99.2 F (37.3 C) 97.8 F (36.6 C) 98.1 F (36.7 C) 98.7 F (37.1 C)  TempSrc: Axillary Oral Oral Oral  SpO2: 100% 100% 99% 100%  Weight:      Height:        General: Pt is alert, awake, not in acute distress, baseline cognitive deficits Cardiovascular: RRR, S1/S2 +, no rubs, no gallops Respiratory: CTA bilaterally, no wheezing, no rhonchi Abdominal: Soft, NT, ND, bowel sounds + Extremities: no edema, no cyanosis    The results of significant diagnostics from this hospitalization (including imaging, microbiology, ancillary and laboratory) are listed below for reference.     Microbiology: Recent Results (from the past 240 hour(s))  Blood culture (routine x 2)     Status: None (Preliminary result)   Collection Time: 01/06/19  2:19 PM   Specimen: BLOOD LEFT HAND  Result Value Ref Range Status   Specimen Description   Final    BLOOD LEFT HAND Performed at [facility], [place]  [address], [place], [place] [address]    Special Requests   Final    BOTTLES DRAWN AEROBIC AND ANAEROBIC Blood Culture results may not be optimal due to an inadequate volume of blood received in culture bottles Performed at [place] [address], [place], [place] [address]    Culture   Final    NO GROWTH 4 DAYS Performed at [facility], [place] [address], [place], [place] [address]    Report Status PENDING  Incomplete  Blood culture (routine x 2)     Status: None (Preliminary result)    Collection Time: 01/06/19  2:19 PM   Specimen: BLOOD  Result Value Ref Range Status   Specimen Description   Final    BLOOD RIGHT ANTECUBITAL Performed at [place] [address] [place], [place] [address]    Special Requests   Final    BOTTLES DRAWN AEROBIC AND ANAEROBIC Blood Culture adequate volume Performed at [place] [address] [place], [place] [address]    Culture   Final    NO GROWTH 4 DAYS Performed at [facility], [place] [address], [place], [place] [address]    Report Status PENDING  Incomplete  Urine culture     Status: Abnormal   Collection Time: 01/06/19  2:19 PM   Specimen: Urine, Clean Catch  Result Value Ref Range Status   Specimen Description   Final    URINE, CLEAN CATCH Performed at [facility], [place] [address] [place], [place] [address]    Special Requests   Final    NONE Performed at [facility], [place] [address], [place], [place] [address]    Culture >=100,000 COLONIES/mL STAPHYLOCOCCUS EPIDERMIDIS (A)  Final   Report Status 01/09/2019 FINAL  Final   Organism ID, Bacteria STAPHYLOCOCCUS EPIDERMIDIS (A)  Final      Susceptibility   Staphylococcus epidermidis - MIC*    CIPROFLOXACIN 4 RESISTANT Resistant     GENTAMICIN <=0.5 SENSITIVE Sensitive     NITROFURANTOIN <=16 SENSITIVE Sensitive     OXACILLIN >=4 RESISTANT Resistant     TETRACYCLINE 2 SENSITIVE Sensitive     VANCOMYCIN 2 SENSITIVE Sensitive     TRIMETH/SULFA 160 RESISTANT Resistant     CLINDAMYCIN <=0.25 SENSITIVE Sensitive     RIFAMPIN <=0.5 SENSITIVE Sensitive     Inducible Clindamycin NEGATIVE Sensitive     LINEZOLID SENSITIVE Sensitive     * >=100,000 COLONIES/mL STAPHYLOCOCCUS EPIDERMIDIS  SARS Coronavirus 2 (CEPHEID - Performed in [place] hospital lab), Hosp Order     Status: None   Collection Time: 01/06/19  2:19 PM   Specimen: Nasopharyngeal Swab  Result Value Ref Range Status    SARS Coronavirus 2 NEGATIVE NEGATIVE Final    Comment: (NOTE) If result is NEGATIVE SARS-CoV-2 target nucleic acids are NOT DETECTED. The SARS-CoV-2 RNA is generally detectable in upper and lower  respiratory specimens during the acute phase of infection. The lowest  concentration of SARS-CoV-2 viral copies this assay can detect is 250  copies / mL. A negative result does not preclude SARS-CoV-2 infection  and should not be used as the sole basis for treatment or other  patient management decisions.  A negative result may occur with  improper specimen collection / handling, submission of specimen other  than nasopharyngeal swab, presence of viral mutation(s) within the  areas targeted by this assay, and inadequate number of viral copies  (<250 copies /  mL). A negative result must be combined with clinical  observations, patient history, and epidemiological information. If result is POSITIVE SARS-CoV-2 target nucleic acids are DETECTED. The SARS-CoV-2 RNA is generally detectable in upper and lower  respiratory specimens dur ing the acute phase of infection.  Positive  results are indicative of active infection with SARS-CoV-2.  Clinical  correlation with patient history and other diagnostic information is  necessary to determine patient infection status.  Positive results do  not rule out bacterial infection or co-infection with other viruses. If result is PRESUMPTIVE POSTIVE SARS-CoV-2 nucleic acids MAY BE PRESENT.   A presumptive positive result was obtained on the submitted specimen  and confirmed on repeat testing.  While 2019 novel coronavirus  (SARS-CoV-2) nucleic acids may be present in the submitted sample  additional confirmatory testing may be necessary for epidemiological  and / or clinical management purposes  to differentiate between  SARS-CoV-2 and other Sarbecovirus currently known to infect humans.  If clinically indicated additional testing with an alternate test   methodology [phone]) is advised. The SARS-CoV-2 RNA is generally  detectable in upper and lower respiratory sp ecimens during the acute  phase of infection. The expected result is Negative. Fact Sheet for Patients:  StrictlyIdeas.no Fact Sheet for Healthcare Providers: BankingDealers.co.za This test is not yet approved or cleared by the [place] FDA and has been authorized for detection and/or diagnosis of SARS-CoV-2 by FDA under an Emergency Use Authorization (EUA).  This EUA will remain in effect (meaning this test can be used) for the duration of the COVID-19 declaration under Section 564(b)(1) of the Act, 21 U.S.C. section 360bbb-3(b)(1), unless the authorization is terminated or revoked sooner. Performed at [facility], [place] [address] [place], [place] [address]   Urine culture     Status: Abnormal   Collection Time: 01/06/19  8:05 PM   Specimen: Urine, Random  Result Value Ref Range Status   Specimen Description   Final    URINE, RANDOM Performed at [place] [address], [place], [place] [address]    Special Requests   Final    NONE Performed at [facility], [place] [address], [place], [place] [address]    Culture >=100,000 COLONIES/mL STAPHYLOCOCCUS EPIDERMIDIS (A)  Final   Report Status 01/09/2019 FINAL  Final   Organism ID, Bacteria STAPHYLOCOCCUS EPIDERMIDIS (A)  Final      Susceptibility   Staphylococcus epidermidis - MIC*    CIPROFLOXACIN >=8 RESISTANT Resistant     GENTAMICIN <=0.5 SENSITIVE Sensitive     NITROFURANTOIN <=16 SENSITIVE Sensitive     OXACILLIN >=4 RESISTANT Resistant     TETRACYCLINE <=1 SENSITIVE Sensitive     VANCOMYCIN 1 SENSITIVE Sensitive     TRIMETH/SULFA 80 RESISTANT Resistant     CLINDAMYCIN RESISTANT Resistant     RIFAMPIN <=0.5 SENSITIVE Sensitive     Inducible Clindamycin POSITIVE Resistant     LINEZOLID SENSITIVE  Sensitive     * >=100,000 COLONIES/mL STAPHYLOCOCCUS EPIDERMIDIS     Labs: BNP (last 3 results) No results for input(s): BNP in the last 8760 hours. Basic Metabolic Panel: Recent Labs  Lab 01/06/19 1419 01/07/19 0017 01/08/19 0227 01/09/19 0408 01/10/19 0353  NA 136 137 137 138 136  K 3.2* 3.1* 3.8 3.8 3.2*  CL 97* 104 104 106 108  CO2 27 23 18* 20* 21*  GLUCOSE 116* 139* 200* 198* 210*  BUN 51* 38* 26* 16 12  CREATININE 1.29* 1.03 0.99  0.92 0.84  CALCIUM 9.0 7.9* 7.7* 7.7* 7.8*   Liver Function Tests: Recent Labs  Lab 01/06/19 1419 01/07/19 0017  AST 24 22  ALT 24 21  ALKPHOS 61 54  BILITOT 1.6* 1.4*  PROT 6.9 5.9*  ALBUMIN 3.9 3.3*   No results for input(s): LIPASE, AMYLASE in the last 168 hours. No results for input(s): AMMONIA in the last 168 hours. CBC: Recent Labs  Lab 01/06/19 1419 01/07/19 0017 01/08/19 0227 01/09/19 0408 01/10/19 0353  WBC 3.0* 2.6* 2.1* 2.4* 2.5*  NEUTROABS 2.0  --  1.4* 1.5* 1.6*  HGB 9.6* 8.4* 7.2* 7.1* 7.7*  HCT 27.4* 24.9* 21.1* 21.6* 22.6*  MCV 87.3 89.9 90.2 91.9 91.1  PLT 150 126* 111* 118* 121*   Cardiac Enzymes: No results for input(s): CKTOTAL, CKMB, CKMBINDEX, TROPONINI in the last 168 hours. BNP: Invalid input(s): POCBNP CBG: Recent Labs  Lab 01/09/19 0800 01/09/19 1200 01/09/19 1652 01/09/19 2102 01/10/19 0801  GLUCAP 159* 193* 217* 168* 189*   D-Dimer No results for input(s): DDIMER in the last 72 hours. Hgb A1c No results for input(s): HGBA1C in the last 72 hours. Lipid Profile No results for input(s): CHOL, HDL, LDLCALC, TRIG, CHOLHDL, LDLDIRECT in the last 72 hours. Thyroid function studies No results for input(s): TSH, T4TOTAL, T3FREE, THYROIDAB in the last 72 hours.  Invalid input(s): FREET3 Anemia work up No results for input(s): VITAMINB12, FOLATE, FERRITIN, TIBC, IRON, RETICCTPCT in the last 72 hours. Urinalysis    Component Value Date/Time   COLORURINE YELLOW 01/07/2019 0003    APPEARANCEUR HAZY (A) 01/07/2019 0003   LABSPEC 1.025 01/07/2019 0003   PHURINE 5.0 01/07/2019 0003   GLUCOSEU NEGATIVE 01/07/2019 0003   HGBUR MODERATE (A) 01/07/2019 0003   BILIRUBINUR NEGATIVE 01/07/2019 0003   KETONESUR 5 (A) 01/07/2019 0003   PROTEINUR 100 (A) 01/07/2019 0003   UROBILINOGEN 0.2 10/22/2008 1050   NITRITE NEGATIVE 01/07/2019 0003   LEUKOCYTESUR LARGE (A) 01/07/2019 0003   Sepsis Labs Invalid input(s): PROCALCITONIN,  WBC,  LACTICIDVEN Microbiology Recent Results (from the past 240 hour(s))  Blood culture (routine x 2)     Status: None (Preliminary result)   Collection Time: 01/06/19  2:19 PM   Specimen: BLOOD LEFT HAND  Result Value Ref Range Status   Specimen Description   Final    BLOOD LEFT HAND Performed at [facility], [place] [address] [place], [place] [address]    Special Requests   Final    BOTTLES DRAWN AEROBIC AND ANAEROBIC Blood Culture results may not be optimal due to an inadequate volume of blood received in culture bottles Performed at [place] [address] [place], [place] [address]    Culture   Final    NO GROWTH 4 DAYS Performed at [facility], [place] [address] [place], [place] [address]    Report Status PENDING  Incomplete  Blood culture (routine x 2)     Status: None (Preliminary result)   Collection Time: 01/06/19  2:19 PM   Specimen: BLOOD  Result Value Ref Range Status   Specimen Description   Final    BLOOD RIGHT ANTECUBITAL Performed at [facility] [address] [place], [place] [address]    Special Requests   Final    BOTTLES DRAWN AEROBIC AND ANAEROBIC Blood Culture adequate volume Performed at [place] [address] [place], [place] [address]    Culture   Final    NO GROWTH  4 DAYS Performed at [facility], [place] [address] [place], [place] [address]    Report Status PENDING  Incomplete  Urine culture     Status:  Abnormal   Collection Time: 01/06/19  2:19 PM   Specimen: Urine, Clean Catch  Result Value Ref Range Status   Specimen Description   Final    URINE, CLEAN CATCH Performed at [facility], [place] [address], [place], [place] [address]    Special Requests   Final    NONE Performed at [facility], [place] [address] [place], [place] [address]    Culture >=100,000 COLONIES/mL STAPHYLOCOCCUS EPIDERMIDIS (A)  Final   Report Status 01/09/2019 FINAL  Final   Organism ID, Bacteria STAPHYLOCOCCUS EPIDERMIDIS (A)  Final      Susceptibility   Staphylococcus epidermidis - MIC*    CIPROFLOXACIN 4 RESISTANT Resistant     GENTAMICIN <=0.5 SENSITIVE Sensitive     NITROFURANTOIN <=16 SENSITIVE Sensitive     OXACILLIN >=4 RESISTANT Resistant     TETRACYCLINE 2 SENSITIVE Sensitive     VANCOMYCIN 2 SENSITIVE Sensitive     TRIMETH/SULFA 160 RESISTANT Resistant     CLINDAMYCIN <=0.25 SENSITIVE Sensitive     RIFAMPIN <=0.5 SENSITIVE Sensitive     Inducible Clindamycin NEGATIVE Sensitive     LINEZOLID SENSITIVE Sensitive     * >=100,000 COLONIES/mL STAPHYLOCOCCUS EPIDERMIDIS  SARS Coronavirus 2 (CEPHEID - Performed in [facility] hospital lab), Hosp Order     Status: None   Collection Time: 01/06/19  2:19 PM   Specimen: Nasopharyngeal Swab  Result Value Ref Range Status   SARS Coronavirus 2 NEGATIVE NEGATIVE Final    Comment: (NOTE) If result is NEGATIVE SARS-CoV-2 target nucleic acids are NOT DETECTED. The SARS-CoV-2 RNA is generally detectable in upper and lower  respiratory specimens during the acute phase of infection. The lowest  concentration of SARS-CoV-2 viral copies this assay can detect is 250  copies / mL. A negative result does not preclude SARS-CoV-2 infection  and should not be used as the sole basis for treatment or other  patient management decisions.  A negative result may occur with  improper specimen collection / handling, submission of specimen  other  than nasopharyngeal swab, presence of viral mutation(s) within the  areas targeted by this assay, and inadequate number of viral copies  (<250 copies / mL). A negative result must be combined with clinical  observations, patient history, and epidemiological information. If result is POSITIVE SARS-CoV-2 target nucleic acids are DETECTED. The SARS-CoV-2 RNA is generally detectable in upper and lower  respiratory specimens dur ing the acute phase of infection.  Positive  results are indicative of active infection with SARS-CoV-2.  Clinical  correlation with patient history and other diagnostic information is  necessary to determine patient infection status.  Positive results do  not rule out bacterial infection or co-infection with other viruses. If result is PRESUMPTIVE POSTIVE SARS-CoV-2 nucleic acids MAY BE PRESENT.   A presumptive positive result was obtained on the submitted specimen  and confirmed on repeat testing.  While 2019 novel coronavirus  (SARS-CoV-2) nucleic acids may be present in the submitted sample  additional confirmatory testing may be necessary for epidemiological  and / or clinical management purposes  to differentiate between  SARS-CoV-2 and other Sarbecovirus currently known to infect humans.  If clinically indicated additional testing with an alternate test  methodology [phone]) is advised. The SARS-CoV-2 RNA is generally  detectable in upper  and lower respiratory sp ecimens during the acute  phase of infection. The expected result is Negative. Fact Sheet for Patients:  StrictlyIdeas.no Fact Sheet for Healthcare Providers: BankingDealers.co.za This test is not yet approved or cleared by the [place] FDA and has been authorized for detection and/or diagnosis of SARS-CoV-2 by FDA under an Emergency Use Authorization (EUA).  This EUA will remain in effect (meaning this test can be used) for the duration  of the COVID-19 declaration under Section 564(b)(1) of the Act, 21 U.S.C. section 360bbb-3(b)(1), unless the authorization is terminated or revoked sooner. Performed at [facility], [place] [address], [place], [place] [address]   Urine culture     Status: Abnormal   Collection Time: 01/06/19  8:05 PM   Specimen: Urine, Random  Result Value Ref Range Status   Specimen Description   Final    URINE, RANDOM Performed at [facility] [address] [place], [place] [address]    Special Requests   Final    NONE Performed at [facility], [place] [address], [place], [place] [address]    Culture >=100,000 COLONIES/mL STAPHYLOCOCCUS EPIDERMIDIS (A)  Final   Report Status 01/09/2019 FINAL  Final   Organism ID, Bacteria STAPHYLOCOCCUS EPIDERMIDIS (A)  Final      Susceptibility   Staphylococcus epidermidis - MIC*    CIPROFLOXACIN >=8 RESISTANT Resistant     GENTAMICIN <=0.5 SENSITIVE Sensitive     NITROFURANTOIN <=16 SENSITIVE Sensitive     OXACILLIN >=4 RESISTANT Resistant     TETRACYCLINE <=1 SENSITIVE Sensitive     VANCOMYCIN 1 SENSITIVE Sensitive     TRIMETH/SULFA 80 RESISTANT Resistant     CLINDAMYCIN RESISTANT Resistant     RIFAMPIN <=0.5 SENSITIVE Sensitive     Inducible Clindamycin POSITIVE Resistant     LINEZOLID SENSITIVE Sensitive     * >=100,000 COLONIES/mL STAPHYLOCOCCUS EPIDERMIDIS     Time coordinating discharge: Over 30 minutes  SIGNED:   Nicolette Bang, MD  [facility] 01/10/2019, 10:43 AM Pager   If 7PM-7AM, please contact night-coverage [email or web address] Password [email or web address]

## 2019-01-10 NOTE — Progress Notes (Signed)
Upper extremity venous has been completed.   Preliminary results in CV Proc.   Abram Sander 01/10/2019 10:45 AM

## 2019-01-10 NOTE — Progress Notes (Signed)
Pt and wife requesting home health referral (SNF was recommended per PT however prefers to return home).  Provided [facility] list for pt's wife- she requested [facility] referred, orders placed.  Sharren Bridge, MSW, LCSW Transitions of Care 01/10/2019 [phone]

## 2019-01-10 NOTE — Progress Notes (Signed)
Wife to schedule appt with PCP after discharge. Eulas Post, RN

## 2019-01-11 LAB — CULTURE, BLOOD (ROUTINE X 2)
Culture: NO GROWTH
Culture: NO GROWTH
Special Requests: ADEQUATE

## 2019-01-12 DIAGNOSIS — Z9221 Personal history of antineoplastic chemotherapy: Secondary | ICD-10-CM | POA: Diagnosis not present

## 2019-01-12 DIAGNOSIS — B957 Other staphylococcus as the cause of diseases classified elsewhere: Secondary | ICD-10-CM | POA: Diagnosis not present

## 2019-01-12 DIAGNOSIS — Z85828 Personal history of other malignant neoplasm of skin: Secondary | ICD-10-CM | POA: Diagnosis not present

## 2019-01-12 DIAGNOSIS — N400 Enlarged prostate without lower urinary tract symptoms: Secondary | ICD-10-CM | POA: Diagnosis not present

## 2019-01-12 DIAGNOSIS — E7800 Pure hypercholesterolemia, unspecified: Secondary | ICD-10-CM | POA: Diagnosis not present

## 2019-01-12 DIAGNOSIS — R6520 Severe sepsis without septic shock: Secondary | ICD-10-CM | POA: Diagnosis not present

## 2019-01-12 DIAGNOSIS — C679 Malignant neoplasm of bladder, unspecified: Secondary | ICD-10-CM | POA: Diagnosis not present

## 2019-01-12 DIAGNOSIS — E119 Type 2 diabetes mellitus without complications: Secondary | ICD-10-CM | POA: Diagnosis not present

## 2019-01-12 DIAGNOSIS — A419 Sepsis, unspecified organism: Secondary | ICD-10-CM | POA: Diagnosis not present

## 2019-01-12 DIAGNOSIS — Z7984 Long term (current) use of oral hypoglycemic drugs: Secondary | ICD-10-CM | POA: Diagnosis not present

## 2019-01-12 DIAGNOSIS — I10 Essential (primary) hypertension: Secondary | ICD-10-CM | POA: Diagnosis not present

## 2019-01-12 DIAGNOSIS — H9190 Unspecified hearing loss, unspecified ear: Secondary | ICD-10-CM | POA: Diagnosis not present

## 2019-01-12 DIAGNOSIS — Z960 Presence of urogenital implants: Secondary | ICD-10-CM | POA: Diagnosis not present

## 2019-01-12 DIAGNOSIS — G309 Alzheimer's disease, unspecified: Secondary | ICD-10-CM | POA: Diagnosis not present

## 2019-01-12 DIAGNOSIS — Z923 Personal history of irradiation: Secondary | ICD-10-CM | POA: Diagnosis not present

## 2019-01-12 DIAGNOSIS — Z79899 Other long term (current) drug therapy: Secondary | ICD-10-CM | POA: Diagnosis not present

## 2019-01-12 DIAGNOSIS — N390 Urinary tract infection, site not specified: Secondary | ICD-10-CM | POA: Diagnosis not present

## 2019-01-12 DIAGNOSIS — F0280 Dementia in other diseases classified elsewhere without behavioral disturbance: Secondary | ICD-10-CM | POA: Diagnosis not present

## 2019-01-13 ENCOUNTER — Other Ambulatory Visit: Payer: Self-pay

## 2019-01-13 NOTE — Patient Outreach (Signed)
[facility]) Care Management  01/13/2019  [name] [name] Aug 09, 1940 [id number]   EMMI- General Discharge RED ON EMMI ALERT Day # 1 Date: 01/13/2019 Red Alert Reason:  Unfilled prescriptions? Yes    Outreach attempt: spoke with wife [name] [name].  She is able to verify HIPAA.  Advised on reason for call.  She states that she has had so much going on the last few days that everything is confusing. Discussed red alert with her. She states she could not get the system to understand her yesterday.  She states that patient has all his medications.  She states that patient has follow up appointments.  The home health nurse came out on yesterday.  She denies any needs, problems, questions or concerns.  Advised that CM would send letter and brochure.  She is agreeable.     Plan: RN CM will send letter and brochure. RN CM will close case.    Jone Baseman, RN, MSN [facility] Care Management Care Management Coordinator Direct Line [phone] Toll Free: [phone]  Fax: [phone]

## 2019-01-16 DIAGNOSIS — G309 Alzheimer's disease, unspecified: Secondary | ICD-10-CM | POA: Diagnosis not present

## 2019-01-16 DIAGNOSIS — R6520 Severe sepsis without septic shock: Secondary | ICD-10-CM | POA: Diagnosis not present

## 2019-01-16 DIAGNOSIS — N390 Urinary tract infection, site not specified: Secondary | ICD-10-CM | POA: Diagnosis not present

## 2019-01-16 DIAGNOSIS — F0280 Dementia in other diseases classified elsewhere without behavioral disturbance: Secondary | ICD-10-CM | POA: Diagnosis not present

## 2019-01-16 DIAGNOSIS — A419 Sepsis, unspecified organism: Secondary | ICD-10-CM | POA: Diagnosis not present

## 2019-01-16 DIAGNOSIS — B957 Other staphylococcus as the cause of diseases classified elsewhere: Secondary | ICD-10-CM | POA: Diagnosis not present

## 2019-01-17 ENCOUNTER — Telehealth: Payer: Self-pay | Admitting: Oncology

## 2019-01-17 ENCOUNTER — Inpatient Hospital Stay: Payer: Medicare Other

## 2019-01-17 ENCOUNTER — Inpatient Hospital Stay: Payer: Medicare Other | Attending: Oncology | Admitting: Oncology

## 2019-01-17 ENCOUNTER — Other Ambulatory Visit: Payer: Self-pay

## 2019-01-17 VITALS — BP 127/80 | HR 73 | Temp 98.0°F | Resp 17 | Ht 73.0 in | Wt 168.8 lb

## 2019-01-17 DIAGNOSIS — C679 Malignant neoplasm of bladder, unspecified: Secondary | ICD-10-CM | POA: Diagnosis not present

## 2019-01-17 DIAGNOSIS — F0390 Unspecified dementia without behavioral disturbance: Secondary | ICD-10-CM | POA: Insufficient documentation

## 2019-01-17 DIAGNOSIS — D649 Anemia, unspecified: Secondary | ICD-10-CM | POA: Diagnosis not present

## 2019-01-17 DIAGNOSIS — E1165 Type 2 diabetes mellitus with hyperglycemia: Secondary | ICD-10-CM | POA: Diagnosis not present

## 2019-01-17 DIAGNOSIS — C672 Malignant neoplasm of lateral wall of bladder: Secondary | ICD-10-CM | POA: Diagnosis not present

## 2019-01-17 DIAGNOSIS — R630 Anorexia: Secondary | ICD-10-CM | POA: Insufficient documentation

## 2019-01-17 DIAGNOSIS — N1330 Unspecified hydronephrosis: Secondary | ICD-10-CM | POA: Insufficient documentation

## 2019-01-17 NOTE — Telephone Encounter (Signed)
Called and left msg. Mailed printout  °

## 2019-01-17 NOTE — Progress Notes (Signed)
Hematology and Oncology Follow Up Visit  [name] [name] [id number] 02-01-1941 78 y.o. 01/17/2019 11:58 AM [name] [name], MDRamachandran, [name], MD   Principle Diagnosis: 78 year old man with bladder cancer diagnosed in March 2020.  He was found to have T2N0 high-grade urothelial carcinoma.    Prior Therapy:   He is status post cystoscopy and repeat TURBT on May 4 of 2020 under the care of Dr. Alinda Money.  Which showed no muscle invasion.  He is status post TURBT on August 29, 2018 which showed muscle invasive high-grade urothelial carcinoma.  He is status post radiation and weekly carboplatin with cycle 1 to be given on 11/15/2018.  He completed 6 cycles of therapy on 12/27/2018.   Current therapy: Active surveillance.  Interim History: Mr. [name] [name] is here for a follow-up visit.  Since the last visit, he was hospitalized 08/20/2022 and 01/10/2019 for urinary sepsis.  Since his discharge, he is recovering rather slowly although his appetite has been overall poor.  He is eating and drinking small amounts.  He denies any excessive fatigue, tiredness or dizziness.  Denies any nausea or vomiting.  He still has some loose bowel habits at times.  He is not reporting any fevers or chills.   He denied headaches, blurry vision, syncope or seizures.  Denies any fevers, chills or sweats.  Denied chest pain, palpitation, orthopnea or leg edema.  Denied cough, wheezing or hemoptysis.  Denied nausea, vomiting or abdominal pain.  Denies any constipation or diarrhea.  Denies any frequency urgency or hesitancy.  Denies any arthralgias or myalgias.  Denies any skin rashes or lesions.  Denies any bleeding or clotting tendency.  Denies any easy bruising.  Denies any hair or nail changes.  Denies any anxiety or depression.  Remaining review of system is negative.         Medications: Reviewed and updated today. Current Outpatient Medications  Medication Sig Dispense Refill  . dutasteride (AVODART) 0.5 MG  capsule Take 0.5 mg by mouth daily.     [name] glipiZIDE (GLUCOTROL) 5 MG tablet Take 2.5 mg by mouth daily before breakfast.     . hydrochlorothiazide (HYDRODIURIL) 25 MG tablet Take 25 mg by mouth daily.     [name] linezolid (ZYVOX) 600 MG tablet Take 1 tablet (600 mg total) by mouth 2 (two) times daily for 11 days. 22 tablet 0  . memantine (NAMENDA XR) 28 MG CP24 24 hr capsule Take 28 mg by mouth every morning.     . metFORMIN (GLUCOPHAGE) 500 MG tablet Take 1,000 mg by mouth 2 (two) times daily with a meal.    . metoprolol succinate (TOPROL-XL) 25 MG 24 hr tablet Take 25 mg by mouth daily.     . Multiple Vitamin (MULTIVITAMIN WITH MINERALS) TABS tablet Take 1 tablet by mouth every evening.    . Omega-3 Fatty Acids (FISH OIL) 1200 MG CAPS Take 1,200 mg by mouth every evening.    [name] DELICA LANCETS 09B MISC 2 (two) times daily. as directed  4  . perindopril (ACEON) 4 MG tablet Take 4 mg by mouth daily.     . prochlorperazine (COMPAZINE) 10 MG tablet Take 1 tablet (10 mg total) by mouth every 6 (six) hours as needed for nausea or vomiting. (Patient not taking: Reported on 01/06/2019) 30 tablet 0  . saxagliptin HCl (ONGLYZA) 5 MG TABS tablet Take 5 mg by mouth daily.    . vitamin B-12 (CYANOCOBALAMIN) 500 MCG tablet Take 500 mcg by mouth every evening.  No current facility-administered medications for this visit.      Allergies:  Allergies  Allergen Reactions  . Sulfa Antibiotics Hives  . Tetracyclines & Related Rash  . Aricept [Donepezil Hcl] Nausea And Vomiting    Past Medical History, Surgical history, Social history, and Family History without any changes on update.    Physical Exam:   ECOG: 1     General appearance: Alert, awake without any distress. Head: Atraumatic without abnormalities Oropharynx: Without any thrush or ulcers. Eyes: No scleral icterus. Lymph nodes: No lymphadenopathy noted in the cervical, supraclavicular, or axillary nodes Heart:regular rate and  rhythm, without any murmurs or gallops.   Lung: Clear to auscultation without any rhonchi, wheezes or dullness to percussion. Abdomin: Soft, nontender without any shifting dullness or ascites. Musculoskeletal: No clubbing or cyanosis. Neurological: No motor or sensory deficits. Skin: No rashes or lesions. Psychiatric: Mood and affect appeared normal.       Lab Results: Lab Results  Component Value Date   WBC 2.5 (L) 01/10/2019   HGB 7.7 (L) 01/10/2019   HCT 22.6 (L) 01/10/2019   MCV 91.1 01/10/2019   PLT 121 (L) 01/10/2019     Chemistry      Component Value Date/Time   NA 136 01/10/2019 0353   K 3.2 (L) 01/10/2019 0353   CL 108 01/10/2019 0353   CO2 21 (L) 01/10/2019 0353   BUN 12 01/10/2019 0353   CREATININE 0.84 01/10/2019 0353   CREATININE 1.20 12/27/2018 1222      Component Value Date/Time   CALCIUM 7.8 (L) 01/10/2019 0353   ALKPHOS 54 01/07/2019 0017   AST 22 01/07/2019 0017   AST 18 12/27/2018 1222   ALT 21 01/07/2019 0017   ALT 17 12/27/2018 1222   BILITOT 1.4 (H) 01/07/2019 0017   BILITOT 1.4 (H) 12/27/2018 1222      EXAM: CT ABDOMEN AND PELVIS WITH CONTRAST  TECHNIQUE: Multidetector CT imaging of the abdomen and pelvis was performed using the standard protocol following bolus administration of intravenous contrast.  CONTRAST:  118mL OMNIPAQUE IOHEXOL 300 MG/ML  SOLN  COMPARISON:  September 21, 2018  FINDINGS: Lower chest: Chronic elevation of the left hemidiaphragm. Atelectatic changes in the left lung base.  Hepatobiliary: Normal appearance of the liver. Large gallbladder calculus, unchanged. No evidence of biliary ductal dilation.  Pancreas: Unremarkable. No pancreatic ductal dilatation or surrounding inflammatory changes.  Spleen: Normal in size without focal abnormality.  Adrenals/Urinary Tract: Nodular appearance of the left adrenal gland. Mild bilateral renal cortical thinning. No evidence of left nephrolithiasis hydronephrosis.  Right ureteral stent with proximal end within the renal pelvis and distal end within the trigonal region of the urinary bladder. Mild right hydronephrosis and hydroureter. Perinephric and periureteral stranding. Mucosal thickening of the base of the urinary bladder and proximal urethra.  Stomach/Bowel: Stomach is within normal limits. No evidence of bowel wall thickening, distention, or inflammatory changes.  Vascular/Lymphatic: Aortic atherosclerosis, minimal. No enlarged abdominal or pelvic lymph nodes by CT criteria. Shotty retroperitoneal lymph nodes are present.  Reproductive: The prostate is not enlarged.  Other: No abdominal wall hernia or abnormality. No abdominopelvic ascites.  Musculoskeletal: No acute or significant osseous findings.  IMPRESSION: 1. Mild right hydronephrosis and hydroureter with perinephric and periureteral stranding. Right ureteral stent appropriately positioned. 2. Mucosal thickening at the base of the urinary bladder and proximal urethra. 3. Cholelithiasis without evidence of cholecystitis.   Impression and Plan:  78 year old with:  1.  Bladder cancer diagnosed in March 2020.  He was found to have T2N0 high-grade urothelial carcinoma without any evidence of metastatic disease.  He completed definitive therapy with radiation with weekly chemotherapy without any major complaints.  He did have a CT scan of the abdomen pelvis on 01/06/2019 which was personally reviewed and did not show any evidence of disease that is obvious at this time.   The natural course of this disease as well as future treatment options were reiterated.  At this time see no role for any additional systemic therapy and have recommended active surveillance with repeat imaging studies in 6 months.  Systemic therapy will be introduced if he has advanced disease at some point.  That would be in the form of immunotherapy or systemic chemotherapy.  He will need to continue to  have active surveillance with cystoscopies as well under the care of Dr. Alinda Money. Is tentatively on 01/26/2019.   2.  Dementia: No major decline noted at this time.  Appears stable   3.  Renal function surveillance: Kidney function continues to be stable with hydronephrosis noted on CT scan and stent in place.  Repeat cystoscopy and potential stent replacement tentatively scheduled on 01/26/2019 under the care of Dr. Alinda Money.  4.  Anorexia: We discussed strategies today to improve his intake including nutritional supplements and adequate hydration.  5.  Follow-up: In 6 months for repeat evaluation   25  minutes was spent with the patient face-to-face today.  More than 50% of time was dedicated to reviewing his imaging studies, disease status update, treatment options and answering questions regarding plan of care.    Zola Button, MD 8/4/202011:58 AM

## 2019-01-18 DIAGNOSIS — R6520 Severe sepsis without septic shock: Secondary | ICD-10-CM | POA: Diagnosis not present

## 2019-01-18 DIAGNOSIS — B957 Other staphylococcus as the cause of diseases classified elsewhere: Secondary | ICD-10-CM | POA: Diagnosis not present

## 2019-01-18 DIAGNOSIS — A419 Sepsis, unspecified organism: Secondary | ICD-10-CM | POA: Diagnosis not present

## 2019-01-18 DIAGNOSIS — F0280 Dementia in other diseases classified elsewhere without behavioral disturbance: Secondary | ICD-10-CM | POA: Diagnosis not present

## 2019-01-18 DIAGNOSIS — N390 Urinary tract infection, site not specified: Secondary | ICD-10-CM | POA: Diagnosis not present

## 2019-01-18 DIAGNOSIS — G309 Alzheimer's disease, unspecified: Secondary | ICD-10-CM | POA: Diagnosis not present

## 2019-01-19 DIAGNOSIS — N390 Urinary tract infection, site not specified: Secondary | ICD-10-CM | POA: Diagnosis not present

## 2019-01-19 DIAGNOSIS — R6520 Severe sepsis without septic shock: Secondary | ICD-10-CM | POA: Diagnosis not present

## 2019-01-19 DIAGNOSIS — F0280 Dementia in other diseases classified elsewhere without behavioral disturbance: Secondary | ICD-10-CM | POA: Diagnosis not present

## 2019-01-19 DIAGNOSIS — B957 Other staphylococcus as the cause of diseases classified elsewhere: Secondary | ICD-10-CM | POA: Diagnosis not present

## 2019-01-19 DIAGNOSIS — A419 Sepsis, unspecified organism: Secondary | ICD-10-CM | POA: Diagnosis not present

## 2019-01-19 DIAGNOSIS — G309 Alzheimer's disease, unspecified: Secondary | ICD-10-CM | POA: Diagnosis not present

## 2019-01-19 NOTE — Patient Instructions (Addendum)
YOU NEED TO HAVE A COVID 19 TEST ON___Monday, August 10th___ @___1 :00PM____, THIS TEST MUST BE DONE BEFORE SURGERY, [address], [place] , [id number]. ONCE YOUR COVID TEST IS COMPLETED, PLEASE BEGIN THE QUARANTINE INSTRUCTIONS AS OUTLINED IN YOUR HANDOUT.                Crosby Oriordan    Your procedure is scheduled on: 01-26-2019   Report to [place]  Entrance    Report to admitting at 1:45PM   1 VISITOR IS ALLOWED TO WAIT IN WAITING ROOM  ONLY DAY OF YOUR SURGERY.    Call this number if you have problems the morning of surgery [place] AND RINSE YOUR MOUTH OUT, NO CHEWING GUM CANDY OR MINTS.   Remember: Do not eat food After Midnight. YOU MAY HAVE CLEAR LIQUIDS FROM MIDNIGHT UNTIL 9:45AM. At 9:45AM  Nothing by mouth after 9:45am!      Take these medicines the morning of surgery with A SIP OF WATER: METOPROLOL, DUTASTERIDE, NEMENDA   DO NOT TAKE ANY DIABETES MEDICATION THE DAY OF SURGERY! PLEASE CHECK YOUR BLOOD SUGAR THE DAY OF SURGERY. REPORT TO YOUR NURSE ON ARRIVAL.  IF YOUR BLOOD SUGAR IS LESS THAN 70 THE MORNING SURGERY. TREAT YOURSELF SELF WITH 1/2 CUP OF APPLE JUICE THEN RECHECK AFTER 15 MINUTES. YOU NEED TO CALL THE [phone] FOR FURTHER INSTRUCTIONS IF YOUR BLOOD SUGAR IS LESS THAN 70 AFTER TREATING!                                 You may not have any metal on your body including hair pins and              piercings  Do not wear jewelry, make-up, lotions, powders or perfumes, deodorant                   Men may shave face and neck.   Do not bring valuables to the hospital. [place].  Contacts, dentures or bridgework may not be worn into surgery.      Patients discharged the day of surgery will not be allowed to drive home. IF YOU ARE HAVING SURGERY AND GOING HOME THE SAME DAY, YOU MUST HAVE AN ADULT TO DRIVE YOU HOME AND BE  WITH YOU FOR 24 HOURS. YOU MAY GO HOME BY TAXI OR UBER OR ORTHERWISE, BUT AN ADULT MUST ACCOMPANY YOU HOME AND STAY WITH YOU FOR 24 HOURS.  Name and phone number of your driver:  Special Instructions: N/A              Please read over the following fact sheets you were given: _____________________________________________________________________             [facility] - Preparing for Surgery Before surgery, you can play an important role.  Because skin is not sterile, your skin needs to be as free of germs as possible.  You can reduce the number of germs on your skin by washing with CHG (chlorahexidine gluconate) soap before surgery.  CHG is an antiseptic cleaner which kills germs and bonds with the skin to continue killing germs even after washing. Please DO NOT use if you have an allergy to CHG or antibacterial soaps.  If your skin becomes reddened/irritated stop using the CHG and inform your nurse when you arrive at Short Stay. Do not shave (including legs and underarms) for at least 48 hours prior to the first CHG shower.  You may shave your face/neck. Please follow these instructions carefully:  1.  Shower with CHG Soap the night before surgery and the  morning of Surgery.  2.  If you choose to wash your hair, wash your hair first as usual with your  normal  shampoo.  3.  After you shampoo, rinse your hair and body thoroughly to remove the  shampoo.                           4.  Use CHG as you would any other liquid soap.  You can apply chg directly  to the skin and wash                       Gently with a scrungie or clean washcloth.  5.  Apply the CHG Soap to your body ONLY FROM THE NECK DOWN.   Do not use on face/ open                           Wound or open sores. Avoid contact with eyes, ears mouth and genitals (private parts).                       Wash face,  Genitals (private parts) with your normal soap.             6.  Wash thoroughly, paying special attention to the area where  your surgery  will be performed.  7.  Thoroughly rinse your body with warm water from the neck down.  8.  DO NOT shower/wash with your normal soap after using and rinsing off  the CHG Soap.                9.  Pat yourself dry with a clean towel.            10.  Wear clean pajamas.            11.  Place clean sheets on your bed the night of your first shower and do not  sleep with pets. Day of Surgery : Do not apply any lotions/deodorants the morning of surgery.  Please wear clean clothes to the hospital/surgery center.  FAILURE TO FOLLOW THESE INSTRUCTIONS MAY RESULT IN THE CANCELLATION OF YOUR SURGERY PATIENT SIGNATURE_________________________________  NURSE SIGNATURE__________________________________  ________________________________________________________________________

## 2019-01-19 NOTE — Progress Notes (Signed)
cxr 01-06-2019 epic   ekg 01-09-2019 epic   Echo 2014 epic

## 2019-01-20 ENCOUNTER — Encounter (HOSPITAL_COMMUNITY): Payer: Self-pay

## 2019-01-20 ENCOUNTER — Encounter (HOSPITAL_COMMUNITY)
Admission: RE | Admit: 2019-01-20 | Discharge: 2019-01-20 | Disposition: A | Discharge status: Home / Self Care | Payer: Medicare Other | Source: Ambulatory Visit | Attending: Urology | Admitting: Urology

## 2019-01-20 ENCOUNTER — Other Ambulatory Visit: Payer: Self-pay

## 2019-01-20 DIAGNOSIS — N135 Crossing vessel and stricture of ureter without hydronephrosis: Secondary | ICD-10-CM | POA: Diagnosis not present

## 2019-01-20 DIAGNOSIS — Z20828 Contact with and (suspected) exposure to other viral communicable diseases: Secondary | ICD-10-CM | POA: Insufficient documentation

## 2019-01-20 DIAGNOSIS — Z79899 Other long term (current) drug therapy: Secondary | ICD-10-CM | POA: Diagnosis not present

## 2019-01-20 DIAGNOSIS — N400 Enlarged prostate without lower urinary tract symptoms: Secondary | ICD-10-CM | POA: Insufficient documentation

## 2019-01-20 DIAGNOSIS — Z85828 Personal history of other malignant neoplasm of skin: Secondary | ICD-10-CM | POA: Insufficient documentation

## 2019-01-20 DIAGNOSIS — G309 Alzheimer's disease, unspecified: Secondary | ICD-10-CM | POA: Insufficient documentation

## 2019-01-20 DIAGNOSIS — E119 Type 2 diabetes mellitus without complications: Secondary | ICD-10-CM | POA: Insufficient documentation

## 2019-01-20 DIAGNOSIS — E7800 Pure hypercholesterolemia, unspecified: Secondary | ICD-10-CM | POA: Diagnosis not present

## 2019-01-20 DIAGNOSIS — I10 Essential (primary) hypertension: Secondary | ICD-10-CM | POA: Diagnosis not present

## 2019-01-20 DIAGNOSIS — Z01812 Encounter for preprocedural laboratory examination: Secondary | ICD-10-CM | POA: Diagnosis not present

## 2019-01-20 DIAGNOSIS — Z7984 Long term (current) use of oral hypoglycemic drugs: Secondary | ICD-10-CM | POA: Diagnosis not present

## 2019-01-20 DIAGNOSIS — C679 Malignant neoplasm of bladder, unspecified: Secondary | ICD-10-CM | POA: Insufficient documentation

## 2019-01-20 DIAGNOSIS — Z9221 Personal history of antineoplastic chemotherapy: Secondary | ICD-10-CM | POA: Insufficient documentation

## 2019-01-20 DIAGNOSIS — F0280 Dementia in other diseases classified elsewhere without behavioral disturbance: Secondary | ICD-10-CM | POA: Diagnosis not present

## 2019-01-20 HISTORY — DX: Urgency of urination: R39.15

## 2019-01-20 HISTORY — DX: Tubulo-interstitial nephritis, not specified as acute or chronic: N12

## 2019-01-20 LAB — GLUCOSE, CAPILLARY: Glucose-Capillary: 193 mg/dL — ABNORMAL HIGH (ref 70–99)

## 2019-01-23 ENCOUNTER — Other Ambulatory Visit (HOSPITAL_COMMUNITY)
Admission: RE | Admit: 2019-01-23 | Discharge: 2019-01-23 | Disposition: A | Discharge status: Home / Self Care | Payer: Medicare Other | Source: Ambulatory Visit | Attending: Urology | Admitting: Urology

## 2019-01-23 DIAGNOSIS — R6520 Severe sepsis without septic shock: Secondary | ICD-10-CM | POA: Diagnosis not present

## 2019-01-23 DIAGNOSIS — Z01812 Encounter for preprocedural laboratory examination: Secondary | ICD-10-CM | POA: Diagnosis not present

## 2019-01-23 DIAGNOSIS — F0280 Dementia in other diseases classified elsewhere without behavioral disturbance: Secondary | ICD-10-CM | POA: Diagnosis not present

## 2019-01-23 DIAGNOSIS — N390 Urinary tract infection, site not specified: Secondary | ICD-10-CM | POA: Diagnosis not present

## 2019-01-23 DIAGNOSIS — C679 Malignant neoplasm of bladder, unspecified: Secondary | ICD-10-CM | POA: Diagnosis not present

## 2019-01-23 DIAGNOSIS — B957 Other staphylococcus as the cause of diseases classified elsewhere: Secondary | ICD-10-CM | POA: Diagnosis not present

## 2019-01-23 DIAGNOSIS — Z20828 Contact with and (suspected) exposure to other viral communicable diseases: Secondary | ICD-10-CM | POA: Diagnosis not present

## 2019-01-23 DIAGNOSIS — N135 Crossing vessel and stricture of ureter without hydronephrosis: Secondary | ICD-10-CM | POA: Diagnosis not present

## 2019-01-23 DIAGNOSIS — G309 Alzheimer's disease, unspecified: Secondary | ICD-10-CM | POA: Diagnosis not present

## 2019-01-23 DIAGNOSIS — A419 Sepsis, unspecified organism: Secondary | ICD-10-CM | POA: Diagnosis not present

## 2019-01-23 DIAGNOSIS — I10 Essential (primary) hypertension: Secondary | ICD-10-CM | POA: Diagnosis not present

## 2019-01-23 LAB — SARS CORONAVIRUS 2 (TAT 6-24 HRS): SARS Coronavirus 2: NEGATIVE

## 2019-01-24 DIAGNOSIS — R6520 Severe sepsis without septic shock: Secondary | ICD-10-CM | POA: Diagnosis not present

## 2019-01-24 DIAGNOSIS — G309 Alzheimer's disease, unspecified: Secondary | ICD-10-CM | POA: Diagnosis not present

## 2019-01-24 DIAGNOSIS — N390 Urinary tract infection, site not specified: Secondary | ICD-10-CM | POA: Diagnosis not present

## 2019-01-24 DIAGNOSIS — B957 Other staphylococcus as the cause of diseases classified elsewhere: Secondary | ICD-10-CM | POA: Diagnosis not present

## 2019-01-24 NOTE — Progress Notes (Signed)
Anesthesia Chart Review   Case: [id number] Date/Time: 01/26/19 1535   Procedure: CYSTOSCOPY WITH STENT PLACEMENT/ RETROGRADE PYELOGRAM/ POSSIBLE BLADDER BIOPSY VERSUS TRANSURETHRAL RESECTION OF BLADDER TUMOR (Right )   Anesthesia type: General   Pre-op diagnosis: RIGHT URETERAL OBSTRUCTION, BLADDER CANCER   Location: [facility] 03 / [facility]   Surgeon: Raynelle Bring, MD      DISCUSSION:78 y.o. never smoker with h/o HTN, BPH, dementia, DM II, bladder cancer currently receiving radiation and weekly carboplatin, anemia, right ureteral obstruction scheduled for above procedure 01/20/2019 with Dr. Raynelle Bring.   Recent admission 7/24-7/28/2020 with severe spesis, UTI.  Discharged on Linezolid for MRSE UTI.  Hemoglobin 7.7 at discharge on 01/10/2019.  Will recheck DOS.    VS: BP 119/60   Pulse 87   Temp 36.4 C (Oral)   Resp 16   SpO2 100%   PROVIDERS: [name], MD is PCP   Zola Button, MD is Oncologist  LABS: Labs reviewed: Acceptable for surgery. (all labs ordered are listed, but only abnormal results are displayed)  Labs Reviewed  GLUCOSE, CAPILLARY - Abnormal; Notable for the following components:      Result Value   Glucose-Capillary 193 (*)    All other components within normal limits     IMAGES:   EKG: 01/09/2019 Rate 75 bpm  Sinus tachycardia  Multiple premature complexes, vent & supravent Incomplete left bundle branch block   CV:  Past Medical History:  Diagnosis Date  . Alzheimer disease ([place])   . Alzheimer's disease ([place])   . Bilateral cold feet   . Bladder cancer ([place])    dx in 2013  . BPH (benign prostatic hypertrophy)   . Dementia ([place]) 12-11-11   memory short term(steadily progressive worsening-pt. unaware) .dx. Alzheimers  . Diabetes mellitus 12-11-11   oral meds only type II   . 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 Hard of hearing   . Hearing loss    has one hearing aid, doesn't wear  . History of  chemotherapy   . History of gallstones 2005  . History of kidney stones   . Hx of nonmelanoma skin cancer   . Hypercholesterolemia   . Hypertension 12-11-11   controlled with meds  . Increased frequency of urination    wife reports urinary frequency has not improved since last surgery   . Pulmonary nodules    Bilateral  . Pyelonephritis    hospital admission july 2020  . Renal calculus, left 10/12/2013  . Urinary urgency   . UTI (urinary tract infection) 08-09-13   multiple, last tx. 1 week ago    Past Surgical History:  Procedure Laterality Date  . BLADDER SURGERY  12-11-11   2007-tumor removal -stent placed and removed.Lavell Islam  '08 for stone  . CATARACT EXTRACTION, BILATERAL     bil.  LEFT EYE 08-20-09    RT EYE 08-27-09  . COLONOSCOPY    . CYSTOSCOPY  05-16-2007  . cystoscopy  10-09-2008  . CYSTOSCOPY N/A 11/18/2015   Procedure: CYSTOSCOPY;  Surgeon: Raynelle Bring, MD;  [facility];  Service: Urology;  Laterality: N/A;  . CYSTOSCOPY N/A 10/19/2016   Procedure: CYSTOSCOPY;  Surgeon: Raynelle Bring, MD;  [facility];  Service: Urology;  Laterality: N/A;  . CYSTOSCOPY N/A 11/11/2017   Procedure: BLUE LIGHT CYSTOSCOPY WITH CYSVIEW;  Surgeon: Raynelle Bring, MD;  [facility];  Service: Urology;  Laterality: N/A;  . CYSTOSCOPY W/ RETROGRADES  12/21/2011   Procedure: CYSTOSCOPY  WITH RETROGRADE PYELOGRAM;  Surgeon: [name], MD;  [facility];  Service: Urology;  Laterality: Bilateral;  . CYSTOSCOPY W/ RETROGRADES Bilateral 08/14/2013   Procedure: CYSTOSCOPY WITH BILATERAL  RETROGRADE PYELOGRAM/LEFT DIGITAL FLEXIBLE URETEROSCOPY/INSERTION LEFT URETERAL STENT;  Surgeon: [name], MD;  [facility];  Service: Urology;  Laterality: Bilateral;  . CYSTOSCOPY W/ RETROGRADES Bilateral 07/23/2014   Procedure: CYSTOSCOPY WITH RETROGRADE PYELOGRAM;  Surgeon: Raynelle Bring, MD;  [facility];  Service: Urology;  Laterality: Bilateral;  . CYSTOSCOPY W/ RETROGRADES Bilateral  05/13/2015   Procedure: CYSTOSCOPY WITH RETROGRADE PYELOGRAM;  Surgeon: Raynelle Bring, MD;  [facility];  Service: Urology;  Laterality: Bilateral;  . CYSTOSCOPY W/ RETROGRADES Bilateral 03/02/2016   Procedure: CYSTOSCOPY, EXAM UNDER ANESTHESIA;  Surgeon: Raynelle Bring, MD;  [facility];  Service: Urology;  Laterality: Bilateral;  . CYSTOSCOPY W/ RETROGRADES N/A 09/28/2016   Procedure: CYSTOSCOPY WITH RETROGRADE PYELOGRAM;  Surgeon: Raynelle Bring, MD;  [facility];  Service: Urology;  Laterality: N/A;  . CYSTOSCOPY WITH BIOPSY  12/21/2011   Procedure: CYSTOSCOPY WITH BIOPSY;  Surgeon: [name], MD;  [facility];  Service: Urology;;  . Consuela Mimes WITH BIOPSY N/A 08/29/2018   Procedure: CYSTOSCOPY WITH BIOPSY OF BLADDER TUMOR/ TRANSURETHRAL RESECTION OF BLADDER TUMOR/ RIGHT RETROGRADE PYELOGRAM/RIGHT STENT PLACEMENT;  Surgeon: Raynelle Bring, MD;  [facility];  Service: Urology;  Laterality: N/A;  . EXTRACORPOREAL SHOCK WAVE LITHOTRIPSY  2005  . EYE SURGERY    . LITHOTRIPSY  12-11-11   '05/ '10  . mose procedure  Left 11-12-15 and feb 2017  . NEPHROLITHOTOMY Left 10/12/2013   Procedure: NEPHROLITHOTOMY PERCUTANEOUS;  Surgeon: [name], MD;  [facility];  Service: Urology;  Laterality: Left;  . stent to kidney  04-29-2007  . SUBDURAL HEMORRHAGE DRAINAGE     drainage per boreholes only 12-12-03  . TONSILLECTOMY  12-11-11   child  . TRANSURETHRAL RESECTION OF BLADDER TUMOR  12/21/2011   Procedure: TRANSURETHRAL RESECTION OF BLADDER TUMOR (TURBT);  Surgeon: [name], MD;  [facility];  Service: Urology;  Laterality: N/A;     . TRANSURETHRAL RESECTION OF BLADDER TUMOR N/A 08/14/2013   Procedure: TRANSURETHRAL RESECTION OF BLADDER TUMOR (TURBT);  Surgeon: [name], MD;  [facility];  Service: Urology;  Laterality: N/A;  . TRANSURETHRAL RESECTION OF BLADDER TUMOR N/A 07/23/2014   Procedure: TRANSURETHRAL RESECTION OF BLADDER TUMOR (TURBT) WITH MITOMYCIN  INSTILLATION;  Surgeon: Raynelle Bring, MD;  [facility];  Service: Urology;  Laterality: N/A;  . TRANSURETHRAL RESECTION OF BLADDER TUMOR N/A 05/13/2015   Procedure: TRANSURETHRAL RESECTION OF BLADDER TUMOR (TURBT);  Surgeon: Raynelle Bring, MD;  [facility];  Service: Urology;  Laterality: N/A;  . TRANSURETHRAL RESECTION OF BLADDER TUMOR N/A 11/18/2015   Procedure: TRANSURETHRAL RESECTION OF BLADDER TUMOR (TURBT);  Surgeon: Raynelle Bring, MD;  [facility];  Service: Urology;  Laterality: N/A;  . TRANSURETHRAL RESECTION OF BLADDER TUMOR N/A 03/02/2016   Procedure: TRANSURETHRAL RESECTION OF BLADDER TUMOR (TURBT);  Surgeon: Raynelle Bring, MD;  [facility];  Service: Urology;  Laterality: N/A;  . TRANSURETHRAL RESECTION OF BLADDER TUMOR N/A 09/28/2016   Procedure: TRANSURETHRAL RESECTION OF BLADDER TUMOR (TURBT);  Surgeon: Raynelle Bring, MD;  [facility];  Service: Urology;  Laterality: N/A;  . TRANSURETHRAL RESECTION OF BLADDER TUMOR N/A 10/19/2016   Procedure: TRANSURETHRAL RESECTION OF BLADDER TUMOR (TURBT);  Surgeon: Raynelle Bring, MD;  [facility];  Service: Urology;  Laterality: N/A;  NEEDS 30 MIN  TOTAL FOR BOTH PROCEDURES  . TRANSURETHRAL RESECTION OF BLADDER TUMOR N/A 11/11/2017   Procedure: TRANSURETHRAL RESECTION OF BLADDER TUMOR (TURBT) WITH CYSTOSCOPY;  Surgeon: Raynelle Bring, MD;  [facility];  Service: Urology;  Laterality: N/A;  . TRANSURETHRAL RESECTION OF BLADDER TUMOR N/A 10/17/2018   Procedure: TRANSURETHRAL RESECTION OF BLADDER TUMOR (TURBT) WITH CYSTOSCOPY, RIGHT URETEROSCOPY, RIGHT URETERAL STENT CHANGE;  Surgeon: Raynelle Bring, MD;  [facility];  Service: Urology;  Laterality: N/A;  GENERAL ANESTHESIA WITH POSSIBLE PARALYSIS  . UPPER GI ENDOSCOPY      MEDICATIONS: . dutasteride (AVODART) 0.5 MG capsule  . glipiZIDE (GLUCOTROL) 5 MG tablet  . hydrochlorothiazide (HYDRODIURIL) 25 MG tablet  . memantine (NAMENDA XR) 28 MG CP24 24 hr capsule  .  metFORMIN (GLUCOPHAGE) 500 MG tablet  . metoprolol succinate (TOPROL-XL) 25 MG 24 hr tablet  . Multiple Vitamin (MULTIVITAMIN WITH MINERALS) TABS tablet  . Omega-3 Fatty Acids (FISH OIL) 1200 MG CAPS  . ONETOUCH DELICA LANCETS 30D MISC  . perindopril (ACEON) 4 MG tablet  . prochlorperazine (COMPAZINE) 10 MG tablet  . saxagliptin HCl (ONGLYZA) 5 MG TABS tablet  . vitamin B-12 (CYANOCOBALAMIN) 500 MCG tablet   No current facility-administered medications for this encounter.     Maia Plan [facility] [phone] 01/24/19  2:06 PM

## 2019-01-24 NOTE — Anesthesia Preprocedure Evaluation (Addendum)
Anesthesia Evaluation  Patient identified by MRN, date of birth, ID band Patient awake    Reviewed: Allergy & Precautions, NPO status , Patient's Chart, lab work & pertinent test results  Airway Mallampati: II  TM Distance: >3 FB Neck ROM: Full    Dental no notable dental hx.    Pulmonary neg pulmonary ROS,    Pulmonary exam normal breath sounds clear to auscultation       Cardiovascular hypertension, Normal cardiovascular exam Rhythm:Regular Rate:Normal     Neuro/Psych Dementia negative neurological ROS     GI/Hepatic negative GI ROS, Neg liver ROS,   Endo/Other  diabetes  Renal/GU negative Renal ROS  negative genitourinary   Musculoskeletal negative musculoskeletal ROS (+)   Abdominal   Peds negative pediatric ROS (+)  Hematology negative hematology ROS (+)   Anesthesia Other Findings   Reproductive/Obstetrics negative OB ROS                            Anesthesia Physical Anesthesia Plan  ASA: III  Anesthesia Plan: General   Post-op Pain Management:    Induction: Intravenous  PONV Risk Score and Plan: 2 and Ondansetron and Dexamethasone  Airway Management Planned: LMA  Additional Equipment:   Intra-op Plan:   Post-operative Plan: Extubation in OR  Informed Consent: I have reviewed the patients History and Physical, chart, labs and discussed the procedure including the risks, benefits and alternatives for the proposed anesthesia with the patient or authorized representative who has indicated his/her understanding and acceptance.     Dental advisory given  Plan Discussed with: CRNA and Surgeon  Anesthesia Plan Comments: (See PAT note 01/20/2019, Konrad Felix, PA-C)       Anesthesia Quick Evaluation

## 2019-01-24 NOTE — Progress Notes (Signed)
BMP , CBCdiff , hgba1c received from [facility]. CBC reviewed by APP Janett Billow. Per Janett Billow request redraw of CBC ordered for day of surgery.  Unfortunately hgba1c received is too old to use for upcoming surgery , order placed in epic to draw am of surgery as well.    lLOV with Dr Felicie Morn 11-01-2018 on chart from [facility]

## 2019-01-25 NOTE — H&P (Signed)
1. Urothelial carcinoma the bladder  2. Right ureteral obstruction    After his last evaluation, he was diagnosed with muscle invasive urothelial carcinoma the bladder with right ureteral obstruction. He was felt to be a poor candidate for cystectomy due to his severe dementia. After discussion, he elected to proceed with chemotherapy and radiation. He is scheduled to complete therapy on July 17th. Thus far, he has been tolerating therapy relatively well. He does have expected fatigue and urinary urgency and frequency symptoms. His indwelling right ureteral stent was changed on May 4th. This was already noted to be mildly to moderately encrusted after only a short period of time. He denies hematuria except for one day.     ALLERGIES: Aricept Nitrofurantoin Monohyd Macro CAPS Sulfa Drugs Tetracyclines    MEDICATIONS: Metoprolol Succinate 25 mg tablet, extended release 24 hr  Avodart 0.5 mg capsule 0 Oral  Fish Oil CAPS Oral  Glipizide  Hydrochlorothiazide 25 mg tablet Oral  MetFORMIN HCl - 500 MG Oral Tablet Oral  Namenda Xr 28 mg capsule sprinkle, extended release 24 hr Oral  Onglyza 5 mg tablet Oral  Perindopril Erbumine 4 MG Oral Tablet Oral  Trospium Chloride 20 mg tablet 1 tablet PO BID  Vitamin B-12 100 mcg tablet Oral     GU PSH: Bladder Instill AntiCA Agent - 2016 Catheterize For Residual - 03/23/2018 Cysto Bladder Ureth Biopsy - 2018 Cysto Uretero Lithotripsy - 2010 Cystoscopy - 08/10/2018, 03/23/2018, 09/24/2017, 05/21/2017, 02/19/2017, 2018 Cystoscopy Fulguration - 2010 Cystoscopy Insert Stent, Right - 10/17/2018, Right - 08/29/2018, 2015, 2010, 2010, 2008 Cystoscopy TURBT <2 cm - 08/29/2018, 11/11/2017, 2018, 2017, 2015, 2013 Cystoscopy TURBT 2-5 cm - 10/17/2018, 2017, 2016, 2016 Cystoscopy Ureteroscopy, Right - 10/17/2018, Right - 08/29/2018, Left - 2018, 2015 Locm 300-399Mg /Ml Iodine,1Ml - 09/21/2018 Percut Stone Removal >2cm - 2015 Ureteroscopic stone removal - 2008       PSH  Notes: Cystoscopy With Fulguration Medium Lesion (2-5cm), Cystoscopy With Fulguration Medium Lesion (2-5cm), Bladder Injection Of Cancer Treatment, Percutaneous Lithotomy For Stone Over 2cm., Cystoscopy With Insertion Of Ureteral Stent Left, Cystoscopy With Ureteroscopy Left, Cystoscopy With Fulguration Small Lesion (5-15mm), Cystoscopy With Fulguration Small Lesion (5-3mm), Cystoscopy With Pyeloscopy With Lithotripsy, Cystoscopy With Insertion Of Ureteral Stent Right, Cystoscopy With Fulguration, Cystoscopy With Insertion Of Ureteral Stent Right, Cystoscopy With Ureteroscopy With Removal Of Calculus, Cystoscopy With Insertion Of Ureteral Stent Left   NON-GU PSH: None   GU PMH: Ureteral obstruction - 09/20/2018 Urinary Frequency - 09/20/2018 Bladder Cancer overlapping sites - 2017, Malignant neoplasm of overlapping sites of bladder, - 2017 Urinary Urgency, Urinary urgency - 2016 BPH w/LUTS, Benign prostatic hyperplasia (BPH) with straining on urination - 2016 Renal calculus, Nephrolithiasis - 2016      PMH Notes:   [name] [name] is a patient previously followed by Dr. Tresa Endo. 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 He was noted to have a recurrence in 2013 and underwent TURBT in July 2013. He was also incidentally noted to have a stricture of the distal left ureter on RPG studies at that time and a brush biopsy was performed. He was found to have high grade Ta urothelial carcinoma in February 2016.    Oct 2007: TURBT, Low grade Ta  Jul 2013: TURBT (right bladder neck 1.5 cm tumor, low grade Ta),brush biopsy of distal left ureteral stricture (no  malignancy)  Mar 2015: TURBT - Low grade Ta  Feb 2016: TURBT - High grade Ta, [facility]  Mar-Apr 2016: Induction BCG  Jul 2016: Maintenance BCG  Oct 2016: Multiple recurrent tumors noted on surveillance cystoscopy   Dec 2016: TUR - Multiple tumors over right posterior bladder - High grade, Ta  Dec 2016 - Feb 2017: 6 week induction BCG  May 2017: TURBT - High grade, Ta urothelial carcinoma (two tumors)  Oct 2017: Completed induction 6 week course of intravesical gemcitabine [facility] - Dr. Ricky Ala)  Apr 2018: Two small recurrences (each < 1 cm)  Apr 2018: TURBT- High grade Ta, concern for possible lamina propria invasion for tumor at dome  May 2018: Repeat TURBT - No residual malignancy  Jul-Aug 2018: Repeat induction 6 week course of intravesical gemcitabine [facility]- Dr. Ricky Ala)  Sep 2018 - Apr 2019: Monthly maintenance gemcitabine  May 2019: Blue light cystoscopy with TURBT for 2 recurrent tumors posteriorly - High grade, Ta  Jun-Jul 2019: Repeat induction intravesical gemcitabine  Mar 2020: TURBT for two recurrent tumors - High grade, muscle invasive urothelial carcinoma with invasion into distal right ureter, right ureteral stent  Apr 2020: Patient and wife elected trimodality therapy (refused cystectomy due to severe dementia), repeat completion TURBT and right ureteral stent change  May-Jun 2020: XRT and carboplatin     2) Nephrolithiasis: He has a long standing history of calcium oxalate stones. He was incidentally noted to have a large partial staghorn calculus during retrograde pyelography in February 2015.  Apr 2015: L PCNL   3) Prostate cancer screening: We discontinued screening in 2014 due to his low risk for prostate cancer at that time and progressive dementia.  Last PSA: 0.35 (June 2014)   4) BPH/LUTS: He is treated with Avodart.   5) Right ureteral obstruction: This was noted in March 2020 when he developed hydronephrosis due to progression of bladder cancer and development of muscle invasive disease. He began management with right ureteral stent in March 2020:   Mar 2020: R ureteral stent placement (encrusted within 6 weeks  Apr 2020: R ureteral stent change (6 x 26 Bard Inlay Optima)      NON-GU PMH: Bacteriuria (Stable) - 09/20/2018, (Stable), - 09/02/2018 (Stable), - 02/19/2017 (Stable), - 2018, - 2018 Diabetes Type 2 Hypertension Unspecified dementia without behavioral disturbance    FAMILY HISTORY: No pertinent family history - Other Prostate Cancer - No Family History   SOCIAL HISTORY: Marital Status: Married Preferred Language: English; Ethnicity: Not Hispanic Or Latino; Race: White Current Smoking Status: Patient has never smoked.  Has never drank.  Drinks 2 caffeinated drinks per day. Has not had a blood transfusion.     Notes: Never A Smoker   REVIEW OF SYSTEMS:    GU Review Male:   Patient reports frequent urination. Patient denies hard to postpone urination, burning/ pain with urination, get up at night to urinate, leakage of urine, stream starts and stops, trouble starting your streams, and have to strain to urinate .  Gastrointestinal (Lower):   Patient reports diarrhea. Patient denies constipation.  Gastrointestinal (Upper):   Patient denies nausea and vomiting.  Constitutional:   Patient reports weight loss and fatigue. Patient denies fever and night sweats.  Skin:   Patient denies skin rash/ lesion and itching.  Eyes:   Patient denies blurred vision and double vision.  Ears/ Nose/ Throat:   Patient denies sore throat and sinus problems.  Hematologic/Lymphatic:   Patient denies swollen glands and easy bruising.  Cardiovascular:   Patient denies leg swelling and chest pains.  Respiratory:   Patient denies shortness of breath and cough.  Endocrine:   Patient denies excessive thirst.  Musculoskeletal:   Patient denies back pain and joint pain.  Neurological:   Patient reports dizziness. Patient denies headaches.  Psychologic:   Patient denies depression and anxiety.   VITAL SIGNS:     Weight 181 lb / 82.1 kg  Height 70 in / 177.8 cm  BMI 26.0 kg/m   MULTI-SYSTEM PHYSICAL EXAMINATION:    Constitutional: Well-nourished. No physical deformities.  Normally developed. Good grooming.  Respiratory: No labored breathing, no use of accessory muscles. Clear bilaterally.  Cardiovascular: Normal temperature, normal extremity pulses, no swelling, no varicosities. Regular rate and rhythm.        ASSESSMENT:      ICD-10 Details  1 GU:   Bladder Cancer overlapping sites - C67.8   2 NON-GU:   Bacteriuria - R82.71 Stable   PLAN:           1. Muscle invasive urothelial carcinoma the bladder: He will complete his treatment with chemotherapy and radiation on July 17th. He is due for cystoscopy and right ureteral stent change at the end of July or early August and we can re-evaluate his bladder at that time.   2. Right ureteral obstruction: His urine has been cultured today. He will be treated with appropriate preoperative antibiotics and will plan to proceed with cystoscopy and right ureteral stent change in late July or early August.

## 2019-01-26 ENCOUNTER — Encounter (HOSPITAL_COMMUNITY): Payer: Self-pay

## 2019-01-26 ENCOUNTER — Encounter (HOSPITAL_COMMUNITY)
Admission: RE | Disposition: A | Discharge status: Home / Self Care | Payer: Self-pay | Source: Home / Self Care | Attending: Urology

## 2019-01-26 ENCOUNTER — Ambulatory Visit (HOSPITAL_COMMUNITY): Payer: Medicare Other | Admitting: Physician Assistant

## 2019-01-26 ENCOUNTER — Telehealth (HOSPITAL_COMMUNITY): Payer: Self-pay | Admitting: *Deleted

## 2019-01-26 ENCOUNTER — Ambulatory Visit (HOSPITAL_COMMUNITY): Payer: Medicare Other | Admitting: Certified Registered Nurse Anesthetist

## 2019-01-26 ENCOUNTER — Ambulatory Visit (HOSPITAL_COMMUNITY): Payer: Medicare Other

## 2019-01-26 ENCOUNTER — Ambulatory Visit (HOSPITAL_COMMUNITY)
Admission: RE | Admit: 2019-01-26 | Discharge: 2019-01-26 | Disposition: A | Discharge status: Home / Self Care | Payer: Medicare Other | Attending: Urology | Admitting: Urology

## 2019-01-26 DIAGNOSIS — N401 Enlarged prostate with lower urinary tract symptoms: Secondary | ICD-10-CM | POA: Diagnosis not present

## 2019-01-26 DIAGNOSIS — C679 Malignant neoplasm of bladder, unspecified: Secondary | ICD-10-CM | POA: Diagnosis not present

## 2019-01-26 DIAGNOSIS — F0390 Unspecified dementia without behavioral disturbance: Secondary | ICD-10-CM | POA: Insufficient documentation

## 2019-01-26 DIAGNOSIS — I10 Essential (primary) hypertension: Secondary | ICD-10-CM | POA: Insufficient documentation

## 2019-01-26 DIAGNOSIS — Z79899 Other long term (current) drug therapy: Secondary | ICD-10-CM | POA: Insufficient documentation

## 2019-01-26 DIAGNOSIS — Z881 Allergy status to other antibiotic agents status: Secondary | ICD-10-CM | POA: Insufficient documentation

## 2019-01-26 DIAGNOSIS — Z7984 Long term (current) use of oral hypoglycemic drugs: Secondary | ICD-10-CM | POA: Diagnosis not present

## 2019-01-26 DIAGNOSIS — Z882 Allergy status to sulfonamides status: Secondary | ICD-10-CM | POA: Insufficient documentation

## 2019-01-26 DIAGNOSIS — E119 Type 2 diabetes mellitus without complications: Secondary | ICD-10-CM | POA: Insufficient documentation

## 2019-01-26 DIAGNOSIS — N135 Crossing vessel and stricture of ureter without hydronephrosis: Secondary | ICD-10-CM | POA: Insufficient documentation

## 2019-01-26 DIAGNOSIS — N131 Hydronephrosis with ureteral stricture, not elsewhere classified: Secondary | ICD-10-CM | POA: Diagnosis not present

## 2019-01-26 DIAGNOSIS — Z923 Personal history of irradiation: Secondary | ICD-10-CM | POA: Diagnosis not present

## 2019-01-26 HISTORY — PX: CYSTOSCOPY WITH STENT PLACEMENT: SHX5790

## 2019-01-26 LAB — CBC
HCT: 25.6 % — ABNORMAL LOW (ref 39.0–52.0)
Hemoglobin: 8.4 g/dL — ABNORMAL LOW (ref 13.0–17.0)
MCH: 32.7 pg (ref 26.0–34.0)
MCHC: 32.8 g/dL (ref 30.0–36.0)
MCV: 99.6 fL (ref 80.0–100.0)
Platelets: 220 10*3/uL (ref 150–400)
RBC: 2.57 MIL/uL — ABNORMAL LOW (ref 4.22–5.81)
RDW: 23.6 % — ABNORMAL HIGH (ref 11.5–15.5)
WBC: 6.2 10*3/uL (ref 4.0–10.5)
nRBC: 0.6 % — ABNORMAL HIGH (ref 0.0–0.2)

## 2019-01-26 LAB — HEMOGLOBIN A1C
Hgb A1c MFr Bld: 5.9 % — ABNORMAL HIGH (ref 4.8–5.6)
Mean Plasma Glucose: 122.63 mg/dL

## 2019-01-26 LAB — GLUCOSE, CAPILLARY
Glucose-Capillary: 209 mg/dL — ABNORMAL HIGH (ref 70–99)
Glucose-Capillary: 195 mg/dL — ABNORMAL HIGH (ref 70–99)

## 2019-01-26 SURGERY — CYSTOSCOPY, WITH STENT INSERTION
Anesthesia: General | Laterality: Right

## 2019-01-26 MED ORDER — LACTATED RINGERS IV SOLN
INTRAVENOUS | Status: DC
  Administered 2019-01-26: 13:00:00 via INTRAVENOUS

## 2019-01-26 MED ORDER — MIDAZOLAM HCL 2 MG/2ML IJ SOLN
INTRAMUSCULAR | Status: AC
  Filled 2019-01-26: qty 2

## 2019-01-26 MED ORDER — FENTANYL CITRATE (PF) 100 MCG/2ML IJ SOLN
INTRAMUSCULAR | Status: AC
  Filled 2019-01-26: qty 2

## 2019-01-26 MED ORDER — PHENYLEPHRINE 40 MCG/ML (10ML) SYRINGE FOR IV PUSH (FOR BLOOD PRESSURE SUPPORT)
PREFILLED_SYRINGE | INTRAVENOUS | Status: DC | PRN
  Administered 2019-01-26: 80 ug via INTRAVENOUS

## 2019-01-26 MED ORDER — PROPOFOL 10 MG/ML IV BOLUS
INTRAVENOUS | Status: DC | PRN
  Administered 2019-01-26: 120 mg via INTRAVENOUS

## 2019-01-26 MED ORDER — ALBUTEROL SULFATE HFA 108 (90 BASE) MCG/ACT IN AERS
INHALATION_SPRAY | RESPIRATORY_TRACT | Status: AC
  Filled 2019-01-26: qty 6.7

## 2019-01-26 MED ORDER — SODIUM CHLORIDE 0.9 % IV SOLN
2.0000 g | Freq: Once | INTRAVENOUS | Status: AC
  Administered 2019-01-26: 14:00:00 2 g via INTRAVENOUS
  Filled 2019-01-26: qty 20

## 2019-01-26 MED ORDER — ONDANSETRON HCL 4 MG/2ML IJ SOLN
INTRAMUSCULAR | Status: DC | PRN
  Administered 2019-01-26: 4 mg via INTRAVENOUS

## 2019-01-26 MED ORDER — EPHEDRINE SULFATE-NACL 50-0.9 MG/10ML-% IV SOSY
PREFILLED_SYRINGE | INTRAVENOUS | Status: DC | PRN
  Administered 2019-01-26: 10 mg via INTRAVENOUS
  Administered 2019-01-26 (×2): 15 mg via INTRAVENOUS

## 2019-01-26 MED ORDER — LIDOCAINE 2% (20 MG/ML) 5 ML SYRINGE
INTRAMUSCULAR | Status: DC | PRN
  Administered 2019-01-26: 80 mg via INTRAVENOUS

## 2019-01-26 MED ORDER — SODIUM CHLORIDE 0.9 % IR SOLN
Status: DC | PRN
  Administered 2019-01-26: 3000 mL via INTRAVESICAL

## 2019-01-26 MED ORDER — PROPOFOL 10 MG/ML IV BOLUS
INTRAVENOUS | Status: AC
  Filled 2019-01-26: qty 20

## 2019-01-26 MED ORDER — FENTANYL CITRATE (PF) 100 MCG/2ML IJ SOLN
INTRAMUSCULAR | Status: DC | PRN
  Administered 2019-01-26 (×2): 25 ug via INTRAVENOUS

## 2019-01-26 SURGICAL SUPPLY — 14 items
BAG URO CATCHER STRL LF (MISCELLANEOUS) ×3 IMPLANT
CATH INTERMIT  6FR 70CM (CATHETERS) ×3 IMPLANT
CLOTH BEACON ORANGE TIMEOUT ST (SAFETY) ×3 IMPLANT
COVER WAND RF STERILE (DRAPES) IMPLANT
GLOVE BIOGEL M STRL SZ7.5 (GLOVE) ×3 IMPLANT
GOWN STRL REUS W/TWL LRG LVL3 (GOWN DISPOSABLE) ×6 IMPLANT
GUIDEWIRE STR DUAL SENSOR (WIRE) ×3 IMPLANT
KIT TURNOVER KIT A (KITS) IMPLANT
MANIFOLD NEPTUNE II (INSTRUMENTS) ×3 IMPLANT
PACK CYSTO (CUSTOM PROCEDURE TRAY) ×3 IMPLANT
STENT URO INLAY 6FRX26CM (STENTS) ×3 IMPLANT
TUBING CONNECTING 10 (TUBING) ×2 IMPLANT
TUBING CONNECTING 10' (TUBING) ×1
TUBING UROLOGY SET (TUBING) IMPLANT

## 2019-01-26 NOTE — Anesthesia Postprocedure Evaluation (Signed)
Anesthesia Post Note  Patient: [name] [name]  Procedure(s) Performed: CYSTOSCOPY WITH STENT EXCHANGE (Right )     Patient location during evaluation: PACU Anesthesia Type: General Level of consciousness: awake and alert Pain management: pain level controlled Vital Signs Assessment: post-procedure vital signs reviewed and stable Respiratory status: spontaneous breathing, nonlabored ventilation, respiratory function stable and patient connected to nasal cannula oxygen Cardiovascular status: blood pressure returned to baseline and stable Postop Assessment: no apparent nausea or vomiting Anesthetic complications: no    Last Vitals:  Vitals:   01/26/19 1530 01/26/19 1606  BP: 103/64   Pulse: (!) 102   Resp: 15   Temp: 36.6 C 36.5 C  SpO2: 96%     Last Pain:  Vitals:   01/26/19 1606  TempSrc: Axillary  PainSc:                  Usher Hedberg S

## 2019-01-26 NOTE — Transfer of Care (Signed)
Immediate Anesthesia Transfer of Care Note  Patient: [name] [name]  Procedure(s) Performed: CYSTOSCOPY WITH STENT EXCHANGE (Right )  Patient Location: PACU  Anesthesia Type:General  Level of Consciousness: awake,alert, patient cooperative.  Airway & Oxygen Therapy: Patient Spontanous Breathing and Patient connected to face mask oxygen  Post-op Assessment: Report given to RN and Post -op Vital signs reviewed and stable  Post vital signs: Reviewed and stable  Last Vitals:  Vitals Value Taken Time  BP 100/78 01/26/19 1517  Temp    Pulse 46 01/26/19 1515  Resp 18 01/26/19 1518  SpO2  01/26/19 1515  Vitals shown include unvalidated device data.  Last Pain:  Vitals:   01/26/19 1350  TempSrc:   PainSc: 0-No pain         Complications: No apparent anesthesia complications

## 2019-01-26 NOTE — Op Note (Signed)
Preoperative diagnosis:  1. Bladder cancer 2. Right ureteral obstruction   Postoperative diagnosis:  1. Bladder cancer  2. Right ureteral obstruction  Procedure:  1. Cystoscopy 2. Right ureteral stent placement (6 x 26 Bard Inlay Optima - no string)  Surgeon: Roxy Horseman, Brooke Bonito. M.D.  Anesthesia: General  Complications: None  Intraoperative findings: His indwelling stent was mildly encrusted.  EBL: Minimal  Specimens: None  Indication: [name] [name] is a 78 y.o. patient with right ureteral obstruction due to muscle invasive bladder cancer and managed with right ureteral stenting.  He has now completed radiation therapy and chemotherapy for treatment and returns for stent change and to re-evaluate his bladder. After reviewing the management options for treatment, he elected to proceed with the above surgical procedure(s). We have discussed the potential benefits and risks of the procedure, side effects of the proposed treatment, the likelihood of the patient achieving the goals of the procedure, and any potential problems that might occur during the procedure or recuperation. Informed consent has been obtained.  Description of procedure:  The patient was taken to the operating room and general anesthesia was induced.  The patient was placed in the dorsal lithotomy position, prepped and draped in the usual sterile fashion, and preoperative antibiotics were administered. A preoperative time-out was performed.   Cystourethroscopy was performed.  The patient's urethra was examined and was unremarkable. The bladder was then systematically examined in its entirety. The indwelling right ureteral stent was identified and was mildly encrusted.  There were no obvious bladder tumors or evidence to suggest recurrence in the bladder at this time after evaluation with the 30 and 70 degree lens.  Attention then turned to the right ureteral orifice and the patient's indwelling ureteral stent  was identified and brought out to the urethral meatus with the flexible graspers.  A 0.38 sensor guidewire was then advanced up the right ureter into the renal pelvis under fluoroscopic guidance.  The wire was then backloaded through the cystoscope and a ureteral stent was advance over the wire using Seldinger technique.  The stent was positioned appropriately under fluoroscopic and cystoscopic guidance.  The wire was then removed with an adequate stent curl noted in the renal pelvis as well as in the bladder.  The bladder was then emptied and the procedure ended.  The patient appeared to tolerate the procedure well and without complications.  The patient was able to be awakened and transferred to the recovery unit in satisfactory condition.    Pryor Curia MD

## 2019-01-26 NOTE — Discharge Instructions (Signed)

## 2019-01-26 NOTE — Anesthesia Procedure Notes (Signed)
Procedure Name: LMA Insertion Date/Time: 01/26/2019 2:27 PM Performed by: West Pugh, CRNA Pre-anesthesia Checklist: Patient identified, Emergency Drugs available, Suction available, Patient being monitored and Timeout performed Patient Re-evaluated:Patient Re-evaluated prior to induction Oxygen Delivery Method: Circle system utilized Preoxygenation: Pre-oxygenation with 100% oxygen Induction Type: IV induction LMA: LMA with gastric port inserted LMA Size: 4.0 Number of attempts: 1 Placement Confirmation: positive ETCO2 Tube secured with: Tape Dental Injury: Teeth and Oropharynx as per pre-operative assessment

## 2019-01-27 ENCOUNTER — Encounter (HOSPITAL_COMMUNITY): Payer: Self-pay | Admitting: Urology

## 2019-01-27 DIAGNOSIS — G309 Alzheimer's disease, unspecified: Secondary | ICD-10-CM | POA: Diagnosis not present

## 2019-01-27 DIAGNOSIS — A419 Sepsis, unspecified organism: Secondary | ICD-10-CM | POA: Diagnosis not present

## 2019-01-27 DIAGNOSIS — F0280 Dementia in other diseases classified elsewhere without behavioral disturbance: Secondary | ICD-10-CM | POA: Diagnosis not present

## 2019-01-27 DIAGNOSIS — N390 Urinary tract infection, site not specified: Secondary | ICD-10-CM | POA: Diagnosis not present

## 2019-01-27 DIAGNOSIS — R6520 Severe sepsis without septic shock: Secondary | ICD-10-CM | POA: Diagnosis not present

## 2019-01-27 DIAGNOSIS — B957 Other staphylococcus as the cause of diseases classified elsewhere: Secondary | ICD-10-CM | POA: Diagnosis not present

## 2019-01-30 ENCOUNTER — Ambulatory Visit: Payer: Medicare Other | Admitting: Podiatry

## 2019-02-01 DIAGNOSIS — R6520 Severe sepsis without septic shock: Secondary | ICD-10-CM | POA: Diagnosis not present

## 2019-02-01 DIAGNOSIS — A419 Sepsis, unspecified organism: Secondary | ICD-10-CM | POA: Diagnosis not present

## 2019-02-01 DIAGNOSIS — L578 Other skin changes due to chronic exposure to nonionizing radiation: Secondary | ICD-10-CM | POA: Diagnosis not present

## 2019-02-01 DIAGNOSIS — C4442 Squamous cell carcinoma of skin of scalp and neck: Secondary | ICD-10-CM | POA: Diagnosis not present

## 2019-02-01 DIAGNOSIS — N390 Urinary tract infection, site not specified: Secondary | ICD-10-CM | POA: Diagnosis not present

## 2019-02-01 DIAGNOSIS — C44329 Squamous cell carcinoma of skin of other parts of face: Secondary | ICD-10-CM | POA: Diagnosis not present

## 2019-02-01 DIAGNOSIS — L570 Actinic keratosis: Secondary | ICD-10-CM | POA: Diagnosis not present

## 2019-02-01 DIAGNOSIS — G309 Alzheimer's disease, unspecified: Secondary | ICD-10-CM | POA: Diagnosis not present

## 2019-02-01 DIAGNOSIS — D485 Neoplasm of uncertain behavior of skin: Secondary | ICD-10-CM | POA: Diagnosis not present

## 2019-02-01 DIAGNOSIS — F0280 Dementia in other diseases classified elsewhere without behavioral disturbance: Secondary | ICD-10-CM | POA: Diagnosis not present

## 2019-02-01 DIAGNOSIS — B957 Other staphylococcus as the cause of diseases classified elsewhere: Secondary | ICD-10-CM | POA: Diagnosis not present

## 2019-02-01 DIAGNOSIS — Z85828 Personal history of other malignant neoplasm of skin: Secondary | ICD-10-CM | POA: Diagnosis not present

## 2019-02-02 ENCOUNTER — Ambulatory Visit
Admission: RE | Admit: 2019-02-02 | Discharge: 2019-02-02 | Disposition: A | Discharge status: Home / Self Care | Payer: Medicare Other | Source: Ambulatory Visit | Attending: Urology | Admitting: Urology

## 2019-02-02 ENCOUNTER — Other Ambulatory Visit: Payer: Self-pay

## 2019-02-02 DIAGNOSIS — C670 Malignant neoplasm of trigone of bladder: Secondary | ICD-10-CM

## 2019-02-02 NOTE — Progress Notes (Signed)
Radiation Oncology         [phone] ________________________________  Name: [name] [name] MRN: [id number]  Date: 02/02/2019  DOB: 30-Jun-1940  Post Treatment Note  CC: [name] Seashore, MD  [name] Seashore, MD  Diagnosis:   78 y.o.gentleman with muscle invasive urothelial carcinoma of the bladderinvolving thedistal right ureter.  Interval Since Last Radiation:  5 weeks, definitive chemoradiation  11/09/18 - 12/30/18:  The bladder and pelvic nodes were treated to 45 Gy in 25 fractions followed by a boost to the bladder tumor in 11 fractions of 1.8 Gy for a total nominal dose of 64.8 Gy.  Narrative:  I spoke with the patient and his wife, [name] [name], to conduct his routine scheduled 1 month follow up visit via telephone to spare the patient unnecessary potential exposure in the healthcare setting during the current COVID-19 pandemic.  The patient was notified in advance and gave permission to proceed with this visit format. He tolerated treatment relatively well with modest fatigue, nocturia x every hour, abdominal pain associated with urgency, and an episode of diarrhea that was relieved with imodium.  His appetite remained decreased throughout treatment and he was using Boost and Ensure to supplement.  He denied hematuria or nausea/vomiting.                        On review of systems, obtained through his wife with patient in the room nearby, he reports gradual improvement over the past week.  Unfortunately, he required hospitalization shortly after completion of chemoradiation for UTI with suspected urosepsis.  CT abdomen and pelvis at the time of admission on 01/06/2019 showed mild right hydro-ureteronephrosis with an appropriately positioned right ureteral stent and no evidence of bladder tumor recurrence or metastatic disease.  Since discharge home on 01/10/2019, he has had slow, gradual improvement and is now back to eating and drinking more routine.  His wife has noticed  significant improvement in his energy and strength with the improvement in his appetite/diet.  He had recent cystoscopy and stent exchange with Dr. Alinda [name] on 01/26/2019 which he tolerated well.  There was no evidence of tumor recurrence at the time of cystoscopy.  He currently denies dysuria, gross hematuria, excessive daytime frequency, straining to void, incomplete emptying or incontinence.  He has not had recent fever, chills or night sweats and denies abdominal pain, nausea, vomiting, diarrhea or constipation.  Overall, his wife is encouraged by his recent progress.  ALLERGIES:  is allergic to sulfa antibiotics; tetracyclines & related; and aricept [donepezil hcl].  Meds: Current Outpatient Medications  Medication Sig Dispense Refill   dutasteride (AVODART) 0.5 MG capsule Take 0.5 mg by mouth daily.      glipiZIDE (GLUCOTROL) 5 MG tablet Take 2.5 mg by mouth daily before breakfast.      hydrochlorothiazide (HYDRODIURIL) 25 MG tablet Take 25 mg by mouth daily.      memantine (NAMENDA XR) 28 MG CP24 24 hr capsule Take 28 mg by mouth every morning.      metFORMIN (GLUCOPHAGE) 500 MG tablet Take 1,000 mg by mouth 2 (two) times daily with a meal.     metoprolol succinate (TOPROL-XL) 25 MG 24 hr tablet Take 25 mg by mouth daily.      Multiple Vitamin (MULTIVITAMIN WITH MINERALS) TABS tablet Take 1 tablet by mouth every evening.     Omega-3 Fatty Acids (FISH OIL) 1200 MG CAPS Take 1,200 mg by mouth every evening.     ONETOUCH DELICA LANCETS  33G MISC 2 (two) times daily. as directed  4   perindopril (ACEON) 4 MG tablet Take 4 mg by mouth daily.      prochlorperazine (COMPAZINE) 10 MG tablet Take 1 tablet (10 mg total) by mouth every 6 (six) hours as needed for nausea or vomiting. (Patient not taking: Reported on 01/06/2019) 30 tablet 0   saxagliptin HCl (ONGLYZA) 5 MG TABS tablet Take 5 mg by mouth daily.     vitamin B-12 (CYANOCOBALAMIN) 500 MCG tablet Take 500 mcg by mouth every evening.      No current facility-administered medications for this encounter.     Physical Findings:  vitals were not taken for this visit.   /Unable to assess due to telephone follow-up visit format.  Lab Findings: Lab Results  Component Value Date   WBC 6.2 01/26/2019   HGB 8.4 (L) 01/26/2019   HCT 25.6 (L) 01/26/2019   MCV 99.6 01/26/2019   PLT 220 01/26/2019     Radiographic Findings: Ct Head Wo Contrast  Result Date: 01/06/2019 CLINICAL DATA:  Altered mental status.  History of dementia. EXAM: CT HEAD WITHOUT CONTRAST TECHNIQUE: Contiguous axial images were obtained from the base of the skull through the vertex without intravenous contrast. COMPARISON:  Brain MRI, 08/08/2012 FINDINGS: Brain: No evidence of acute infarction, hemorrhage, hydrocephalus, extra-axial collection or mass lesion/mass effect. There is mild ventricular enlargement with more prominent sulcal enlargement reflecting generalized atrophy. Mild periventricular white matter hypoattenuation is noted consistent with chronic microvascular ischemic change. Vascular: No hyperdense vessel or unexpected calcification. Skull: No skull fracture.  51 old burr holes are noted on the left. Sinuses/Orbits: Globes and orbits are unremarkable. Sinuses and mastoid air cells are clear. Other: None. IMPRESSION: 1. No acute intracranial abnormalities. 2. Atrophy and mild chronic microvascular ischemic change. Electronically Signed   By: Lajean Manes M.D.   On: 01/06/2019 16:48   Ct Abdomen Pelvis W Contrast  Result Date: 01/06/2019 CLINICAL DATA:  Patient with history of bladder cancer, who completed chemotherapy on 07/14 and radiation therapy on 07/17. Weakness, weight loss, change in bowel habits. EXAM: CT ABDOMEN AND PELVIS WITH CONTRAST TECHNIQUE: Multidetector CT imaging of the abdomen and pelvis was performed using the standard protocol following bolus administration of intravenous contrast. CONTRAST:  134mL OMNIPAQUE IOHEXOL 300 MG/ML   SOLN COMPARISON:  September 21, 2018 FINDINGS: Lower chest: Chronic elevation of the left hemidiaphragm. Atelectatic changes in the left lung base. Hepatobiliary: Normal appearance of the liver. Large gallbladder calculus, unchanged. No evidence of biliary ductal dilation. Pancreas: Unremarkable. No pancreatic ductal dilatation or surrounding inflammatory changes. Spleen: Normal in size without focal abnormality. Adrenals/Urinary Tract: Nodular appearance of the left adrenal gland. Mild bilateral renal cortical thinning. No evidence of left nephrolithiasis hydronephrosis. Right ureteral stent with proximal end within the renal pelvis and distal end within the trigonal region of the urinary bladder. Mild right hydronephrosis and hydroureter. Perinephric and periureteral stranding. Mucosal thickening of the base of the urinary bladder and proximal urethra. Stomach/Bowel: Stomach is within normal limits. No evidence of bowel wall thickening, distention, or inflammatory changes. Vascular/Lymphatic: Aortic atherosclerosis, minimal. No enlarged abdominal or pelvic lymph nodes by CT criteria. Shotty retroperitoneal lymph nodes are present. Reproductive: The prostate is not enlarged. Other: No abdominal wall hernia or abnormality. No abdominopelvic ascites. Musculoskeletal: No acute or significant osseous findings. IMPRESSION: 1. Mild right hydronephrosis and hydroureter with perinephric and periureteral stranding. Right ureteral stent appropriately positioned. 2. Mucosal thickening at the base of the urinary bladder and  proximal urethra. 3. Cholelithiasis without evidence of cholecystitis. Aortic Atherosclerosis (ICD10-I70.0). Electronically Signed   By: Fidela Salisbury M.D.   On: 01/06/2019 17:01   Dg Chest Port 1 View  Result Date: 01/06/2019 CLINICAL DATA:  Weakness. EXAM: PORTABLE CHEST 1 VIEW COMPARISON:  09/25/2016 FINDINGS: Lungs are adequately inflated with moderate stable elevation of the left hemidiaphragm.  Lungs are otherwise clear. Cardiomediastinal silhouette and remainder of the exam is unchanged. IMPRESSION: No acute findings. Stable elevation left hemidiaphragm. Electronically Signed   By: Marin Olp M.D.   On: 01/06/2019 13:54   Dg C-arm 1-60 Min-no Report  Result Date: 01/26/2019 Fluoroscopy was utilized by the requesting physician.  No radiographic interpretation.   Vas [place] Upper Extremity Venous Duplex  Result Date: 01/10/2019 UPPER VENOUS STUDY  Indications: Swelling Comparison Study: no prior Performing Technologist: Abram Sander RVS  Examination Guidelines: A complete evaluation includes B-mode imaging, spectral Doppler, color Doppler, and power Doppler as needed of all accessible portions of each vessel. Bilateral testing is considered an integral part of a complete examination. Limited examinations for reoccurring indications may be performed as noted.  Right Findings: +----------+------------+---------+-----------+----------+-------+  RIGHT      Compressible Phasicity Spontaneous Properties Summary  +----------+------------+---------+-----------+----------+-------+  IJV            Full        Yes        Yes                         +----------+------------+---------+-----------+----------+-------+  Subclavian     Full        Yes        Yes                         +----------+------------+---------+-----------+----------+-------+  Axillary       Full        Yes        Yes                         +----------+------------+---------+-----------+----------+-------+  Brachial       Full        Yes        Yes                         +----------+------------+---------+-----------+----------+-------+  Radial         Full                                               +----------+------------+---------+-----------+----------+-------+  Ulnar          Full                                               +----------+------------+---------+-----------+----------+-------+  Cephalic       Full                                                +----------+------------+---------+-----------+----------+-------+  Basilic        Full                                               +----------+------------+---------+-----------+----------+-------+  Left Findings: +----------+------------+---------+-----------+----------+-------+  LEFT       Compressible Phasicity Spontaneous Properties Summary  +----------+------------+---------+-----------+----------+-------+  Subclavian     Full        Yes        Yes                         +----------+------------+---------+-----------+----------+-------+  Summary:  Right: No evidence of deep vein thrombosis in the upper extremity. No evidence of superficial vein thrombosis in the upper extremity.  Left: No evidence of thrombosis in the subclavian.  *See table(s) above for measurements and observations.  Diagnosing physician: Deitra Mayo MD Electronically signed by Deitra Mayo MD on 01/10/2019 at 5:54:36 PM.    Final     Impression/Plan: 1. 78 y.o.gentleman with muscle invasive urothelial carcinoma of the bladderinvolving thedistal right ureter. He appears to have recovered well from the effects of his recent chemoradiation.  His wife reports that within the last week he has had a significant improvement in his overall performance status and is now eating and drinking more normally which has improved his energy and strength.  He had a repeat cystoscopy at the time of his stent exchange  with Dr. Alinda [name] on 01/26/2019 which showed no evidence of recurrent tumor. He tolerated the procedure well and has a scheduled follow-up visit with Dr. Alinda [name] in October 2020.  At the time of recent follow up with Dr. Alen Blew on 01/17/19, the plan was to continue in close observation with repeat imaging studies prior to his follow-up visit in February 2021.  We discussed that while we are happy to continue to participate in his care if clinically indicated, at this point, we will plan to see him  back on an as-needed basis.  He will continue in routine follow-up under the care and direction of Dr. Alen Blew and Dr. Alinda [name].  He and his wife appear to have a good understanding of his disease and our recommendations and are comfortable and in agreement with the stated plan.  They know to call at anytime with any questions or concerns related to his previous radiation.    Nicholos Johns, PA-C

## 2019-02-03 DIAGNOSIS — Z23 Encounter for immunization: Secondary | ICD-10-CM | POA: Diagnosis not present

## 2019-02-03 DIAGNOSIS — Z7189 Other specified counseling: Secondary | ICD-10-CM | POA: Diagnosis not present

## 2019-02-03 DIAGNOSIS — E118 Type 2 diabetes mellitus with unspecified complications: Secondary | ICD-10-CM | POA: Diagnosis not present

## 2019-02-03 DIAGNOSIS — I10 Essential (primary) hypertension: Secondary | ICD-10-CM | POA: Diagnosis not present

## 2019-02-03 DIAGNOSIS — F0390 Unspecified dementia without behavioral disturbance: Secondary | ICD-10-CM | POA: Diagnosis not present

## 2019-02-03 DIAGNOSIS — D649 Anemia, unspecified: Secondary | ICD-10-CM | POA: Diagnosis not present

## 2019-02-03 NOTE — Progress Notes (Signed)
  Radiation Oncology         [phone]) [phone] ________________________________  Name: [name] [name] MRN: [id number]  Date: 12/30/2018  DOB: 05/28/1941  End of Treatment Note  Diagnosis:   78 y.o.gentleman with muscle invasive urothelial carcinoma of the bladderinvolving thedistal right ureter     Indication for treatment:  Curative       Radiation treatment dates:   11/09/2018 - 12/30/2018  Site/dose:    1. Bladder + Pelvic Nodes / 45 Gy in 25 fractions 2. Bladder Boost / 19.8 Gy in 11 fractions  Beams/energy:    1. IMRT, photons / 6X 2. Complex Isodose, photons /  6X, 15X  Narrative: The patient tolerated radiation treatment relatively well. He reported increasing nocturia (from x2-3 to every hour), increasing fatigue, diminished appetite, difficulty emptying his bladder, and occasional diarrhea relieved with imodium throughout treatments. He experienced some leakage, hematuria, difficulty starting his stream, and abdominal pain associated with needing to urinate.  Plan: The patient has completed radiation treatment. The patient will return to radiation oncology clinic for routine followup in one month. I advised him to call or return sooner if he has any questions or concerns related to his recovery or treatment. ________________________________  Sheral Apley. Tammi Klippel, M.D.   This document serves as a record of services personally performed by Tyler Pita, MD. It was created on his behalf by Wilburn Mylar, a trained medical scribe. The creation of this record is based on the scribe's personal observations and the provider's statements to them. This document has been checked and approved by the attending provider.

## 2019-02-06 DIAGNOSIS — B957 Other staphylococcus as the cause of diseases classified elsewhere: Secondary | ICD-10-CM | POA: Diagnosis not present

## 2019-02-06 DIAGNOSIS — N390 Urinary tract infection, site not specified: Secondary | ICD-10-CM | POA: Diagnosis not present

## 2019-02-06 DIAGNOSIS — R6520 Severe sepsis without septic shock: Secondary | ICD-10-CM | POA: Diagnosis not present

## 2019-02-06 DIAGNOSIS — A419 Sepsis, unspecified organism: Secondary | ICD-10-CM | POA: Diagnosis not present

## 2019-02-06 DIAGNOSIS — F0280 Dementia in other diseases classified elsewhere without behavioral disturbance: Secondary | ICD-10-CM | POA: Diagnosis not present

## 2019-02-06 DIAGNOSIS — G309 Alzheimer's disease, unspecified: Secondary | ICD-10-CM | POA: Diagnosis not present

## 2019-02-08 DIAGNOSIS — N390 Urinary tract infection, site not specified: Secondary | ICD-10-CM | POA: Diagnosis not present

## 2019-02-08 DIAGNOSIS — R6520 Severe sepsis without septic shock: Secondary | ICD-10-CM | POA: Diagnosis not present

## 2019-02-08 DIAGNOSIS — A419 Sepsis, unspecified organism: Secondary | ICD-10-CM | POA: Diagnosis not present

## 2019-02-08 DIAGNOSIS — B957 Other staphylococcus as the cause of diseases classified elsewhere: Secondary | ICD-10-CM | POA: Diagnosis not present

## 2019-02-08 DIAGNOSIS — G309 Alzheimer's disease, unspecified: Secondary | ICD-10-CM | POA: Diagnosis not present

## 2019-02-08 DIAGNOSIS — F0280 Dementia in other diseases classified elsewhere without behavioral disturbance: Secondary | ICD-10-CM | POA: Diagnosis not present

## 2019-03-21 DIAGNOSIS — N131 Hydronephrosis with ureteral stricture, not elsewhere classified: Secondary | ICD-10-CM | POA: Diagnosis not present

## 2019-03-21 DIAGNOSIS — C678 Malignant neoplasm of overlapping sites of bladder: Secondary | ICD-10-CM | POA: Diagnosis not present

## 2019-03-21 DIAGNOSIS — R3915 Urgency of urination: Secondary | ICD-10-CM | POA: Diagnosis not present

## 2019-03-29 ENCOUNTER — Other Ambulatory Visit: Payer: Self-pay | Admitting: Urology

## 2019-04-12 DIAGNOSIS — E1165 Type 2 diabetes mellitus with hyperglycemia: Secondary | ICD-10-CM | POA: Diagnosis not present

## 2019-04-12 DIAGNOSIS — E782 Mixed hyperlipidemia: Secondary | ICD-10-CM | POA: Diagnosis not present

## 2019-04-12 DIAGNOSIS — I10 Essential (primary) hypertension: Secondary | ICD-10-CM | POA: Diagnosis not present

## 2019-04-12 DIAGNOSIS — Z7189 Other specified counseling: Secondary | ICD-10-CM | POA: Diagnosis not present

## 2019-04-12 DIAGNOSIS — Z0000 Encounter for general adult medical examination without abnormal findings: Secondary | ICD-10-CM | POA: Diagnosis not present

## 2019-04-12 DIAGNOSIS — F0390 Unspecified dementia without behavioral disturbance: Secondary | ICD-10-CM | POA: Diagnosis not present

## 2019-04-12 DIAGNOSIS — N390 Urinary tract infection, site not specified: Secondary | ICD-10-CM | POA: Diagnosis not present

## 2019-04-19 DIAGNOSIS — F0390 Unspecified dementia without behavioral disturbance: Secondary | ICD-10-CM | POA: Diagnosis not present

## 2019-04-19 DIAGNOSIS — C678 Malignant neoplasm of overlapping sites of bladder: Secondary | ICD-10-CM | POA: Diagnosis not present

## 2019-04-19 DIAGNOSIS — I10 Essential (primary) hypertension: Secondary | ICD-10-CM | POA: Diagnosis not present

## 2019-04-19 DIAGNOSIS — D649 Anemia, unspecified: Secondary | ICD-10-CM | POA: Diagnosis not present

## 2019-04-19 DIAGNOSIS — E782 Mixed hyperlipidemia: Secondary | ICD-10-CM | POA: Diagnosis not present

## 2019-04-19 DIAGNOSIS — E118 Type 2 diabetes mellitus with unspecified complications: Secondary | ICD-10-CM | POA: Diagnosis not present

## 2019-04-19 DIAGNOSIS — E1165 Type 2 diabetes mellitus with hyperglycemia: Secondary | ICD-10-CM | POA: Diagnosis not present

## 2019-04-19 DIAGNOSIS — Z7189 Other specified counseling: Secondary | ICD-10-CM | POA: Diagnosis not present

## 2019-05-02 NOTE — Progress Notes (Signed)
PCP - Clydene Laming Cardiologist -   Chest x-ray - 01-06-19 epic EKG - 01-09-19 epic Stress Test -  ECHO - 2014 epic Cardiac Cath -   Sleep Study -  CPAP -   Fasting Blood Sugar -  Checks Blood Sugar _____ times a day  Blood Thinner Instructions: Aspirin Instructions: Last Dose:  Anesthesia review: Alzheimer's  Wife  Cares for pt., had this same procedure 01-26-19   Patient denies shortness of breath, fever, cough and chest pain at PAT appointment   Alzheimers  Wife at preop   Patient verbalized understanding of instructions that were given to them at the PAT appointment. Patient was also instructed that they will need to review over the PAT instructions again at home before surgery. Note

## 2019-05-02 NOTE — Patient Instructions (Signed)
DUE TO COVID-19 ONLY ONE VISITOR IS ALLOWED TO COME WITH YOU AND STAY IN THE WAITING ROOM ONLY DURING PRE OP AND PROCEDURE DAY OF SURGERY. THE 1 VISITOR MAY VISIT WITH YOU AFTER SURGERY IN YOUR PRIVATE ROOM DURING VISITING HOURS ONLY!  YOU NEED TO HAVE A COVID 19 TEST ON_______ @_______ , THIS TEST MUST BE DONE BEFORE SURGERY, COME  [address], [place] [place] , [address].  ([place]) ONCE YOUR COVID TEST IS COMPLETED, PLEASE BEGIN THE QUARANTINE INSTRUCTIONS AS OUTLINED IN YOUR HANDOUT.                Kydan Popescu  05/02/2019   Your procedure is scheduled on: 05-08-19   Report to [facility] Main  Entrance   Report to admitting at       [phone] AM     Call this number if you have problems the morning of surgery [phone]    Remember: Do not eat food or drink liquids :After Midnight.   BRUSH YOUR TEETH MORNING OF SURGERY AND RINSE YOUR MOUTH OUT, NO CHEWING GUM CANDY OR MINTS.     Take these medicines the morning of surgery with A SIP OF WATER: metoprolol, namenda, dutasteride  DO NOT TAKE ANY DIABETIC MEDICATIONS DAY OF YOUR SURGERY                               You may not have any metal on your body including hair pins and              piercings  Do not wear jewelry,  lotions, powders or perfumes, deodorant                      Men may shave face and neck.   Do not bring valuables to the hospital. [place].  Contacts, dentures or bridgework may not be worn into surgery.      Patients discharged the day of surgery will not be allowed to drive home. IF YOU ARE HAVING SURGERY AND GOING HOME THE SAME DAY, YOU MUST HAVE AN ADULT TO DRIVE YOU HOME AND BE WITH YOU FOR 24 HOURS. YOU MAY GO HOME BY TAXI OR UBER OR ORTHERWISE, BUT AN ADULT MUST ACCOMPANY YOU HOME AND STAY WITH YOU FOR 24 HOURS.  Name and phone number of your driver:  Special Instructions: N/A              Please read over the  following fact sheets you were given: _____________________________________________________________________             [facility] - Preparing for Surgery Before surgery, you can play an important role.  Because skin is not sterile, your skin needs to be as free of germs as possible.  You can reduce the number of germs on your skin by washing with CHG (chlorahexidine gluconate) soap before surgery.  CHG is an antiseptic cleaner which kills germs and bonds with the skin to continue killing germs even after washing. Please DO NOT use if you have an allergy to CHG or antibacterial soaps.  If your skin becomes reddened/irritated stop using the CHG and inform your nurse when you arrive at Short Stay. Do not shave (including legs and underarms) for at least 48 hours prior to the first CHG shower.  You may shave your face/neck. Please follow these instructions carefully:  1.  Shower with CHG Soap the night before surgery and the  morning of Surgery.  2.  If you choose to wash your hair, wash your hair first as usual with your  normal  shampoo.  3.  After you shampoo, rinse your hair and body thoroughly to remove the  shampoo.                           4.  Use CHG as you would any other liquid soap.  You can apply chg directly  to the skin and wash                       Gently with a scrungie or clean washcloth.  5.  Apply the CHG Soap to your body ONLY FROM THE NECK DOWN.   Do not use on face/ open                           Wound or open sores. Avoid contact with eyes, ears mouth and genitals (private parts).                       Wash face,  Genitals (private parts) with your normal soap.             6.  Wash thoroughly, paying special attention to the area where your surgery  will be performed.  7.  Thoroughly rinse your body with warm water from the neck down.  8.  DO NOT shower/wash with your normal soap after using and rinsing off  the CHG Soap.                9.  Pat yourself dry with a clean  towel.            10.  Wear clean pajamas.            11.  Place clean sheets on your bed the night of your first shower and do not  sleep with pets. Day of Surgery : Do not apply any lotions/deodorants the morning of surgery.  Please wear clean clothes to the hospital/surgery center.  FAILURE TO FOLLOW THESE INSTRUCTIONS MAY RESULT IN THE CANCELLATION OF YOUR SURGERY PATIENT SIGNATURE_________________________________  NURSE SIGNATURE__________________________________  ________________________________________________________________________

## 2019-05-03 ENCOUNTER — Encounter (HOSPITAL_COMMUNITY): Payer: Self-pay

## 2019-05-03 ENCOUNTER — Encounter (HOSPITAL_COMMUNITY)
Admission: RE | Admit: 2019-05-03 | Discharge: 2019-05-03 | Disposition: A | Discharge status: Home / Self Care | Payer: Medicare Other | Source: Ambulatory Visit | Attending: Urology | Admitting: Urology

## 2019-05-03 ENCOUNTER — Other Ambulatory Visit: Payer: Self-pay

## 2019-05-03 DIAGNOSIS — C679 Malignant neoplasm of bladder, unspecified: Secondary | ICD-10-CM | POA: Insufficient documentation

## 2019-05-03 DIAGNOSIS — Z01812 Encounter for preprocedural laboratory examination: Secondary | ICD-10-CM | POA: Insufficient documentation

## 2019-05-03 DIAGNOSIS — N132 Hydronephrosis with renal and ureteral calculous obstruction: Secondary | ICD-10-CM | POA: Insufficient documentation

## 2019-05-03 HISTORY — DX: Personal history of irradiation: Z92.3

## 2019-05-03 LAB — CBC
HCT: 33.2 % — ABNORMAL LOW (ref 39.0–52.0)
Hemoglobin: 10.3 g/dL — ABNORMAL LOW (ref 13.0–17.0)
MCH: 27.5 pg (ref 26.0–34.0)
MCHC: 31 g/dL (ref 30.0–36.0)
MCV: 88.5 fL (ref 80.0–100.0)
Platelets: 344 10*3/uL (ref 150–400)
RBC: 3.75 MIL/uL — ABNORMAL LOW (ref 4.22–5.81)
RDW: 13.3 % (ref 11.5–15.5)
WBC: 9.4 10*3/uL (ref 4.0–10.5)
nRBC: 0 % (ref 0.0–0.2)

## 2019-05-03 LAB — BASIC METABOLIC PANEL
Anion gap: 11 (ref 5–15)
BUN: 25 mg/dL — ABNORMAL HIGH (ref 8–23)
CO2: 27 mmol/L (ref 22–32)
Calcium: 9.3 mg/dL (ref 8.9–10.3)
Chloride: 103 mmol/L (ref 98–111)
Creatinine, Ser: 1.38 mg/dL — ABNORMAL HIGH (ref 0.61–1.24)
GFR calc Af Amer: 57 mL/min — ABNORMAL LOW (ref >60)
GFR calc non Af Amer: 49 mL/min — ABNORMAL LOW (ref >60)
Glucose, Bld: 151 mg/dL — ABNORMAL HIGH (ref 70–99)
Potassium: 4.7 mmol/L (ref 3.5–5.1)
Sodium: 141 mmol/L (ref 135–145)

## 2019-05-03 LAB — GLUCOSE, CAPILLARY: Glucose-Capillary: 133 mg/dL — ABNORMAL HIGH (ref 70–99)

## 2019-05-03 LAB — HEMOGLOBIN A1C
Hgb A1c MFr Bld: 6.1 % — ABNORMAL HIGH (ref 4.8–5.6)
Mean Plasma Glucose: 128.37 mg/dL

## 2019-05-03 NOTE — Progress Notes (Signed)
Pt. Has Dementia/ Alzheimers knows name and DOB wife present at preop and will come DOS with pt. To SS if needed.

## 2019-05-04 ENCOUNTER — Other Ambulatory Visit (HOSPITAL_COMMUNITY)
Admission: RE | Admit: 2019-05-04 | Discharge: 2019-05-04 | Disposition: A | Discharge status: Home / Self Care | Payer: Medicare Other | Source: Ambulatory Visit | Attending: Urology | Admitting: Urology

## 2019-05-04 DIAGNOSIS — Z20828 Contact with and (suspected) exposure to other viral communicable diseases: Secondary | ICD-10-CM | POA: Diagnosis not present

## 2019-05-04 DIAGNOSIS — Z01812 Encounter for preprocedural laboratory examination: Secondary | ICD-10-CM | POA: Insufficient documentation

## 2019-05-05 LAB — NOVEL CORONAVIRUS, NAA (HOSP ORDER, SEND-OUT TO REF LAB; TAT 18-24 HRS): SARS-CoV-2, NAA: NOT DETECTED

## 2019-05-05 NOTE — H&P (Signed)
1. Urothelial carcinoma of the bladder  2. Right ureteral obstruction   He follows up today after having completed chemotherapy and radiation for definitive treatment of his muscle invasive bladder cancer. He tolerated therapy relatively well although continues to have significant urgency and frequency symptoms. His fatigue has improved and his appetite has improved. He did lose a lot of weight but this appears to be stabilizing according to his wife. His last stent change was performed on 01/26/2019. His stent was noted to be mildly encrusted. No evidence of local tumor recurrence was noted within the bladder. He continues to follow with Dr. Alen Blew in does have his next scheduled appointment for January or February for repeat surveillance imaging. He denies any recent hematuria. His dementia continues to worsen.     ALLERGIES: Aricept Nitrofurantoin Monohyd Macro CAPS Sulfa Drugs Tetracyclines    MEDICATIONS: Metoprolol Succinate 25 mg tablet, extended release 24 hr  Avodart 0.5 mg capsule 0 Oral  Fish Oil CAPS Oral  Glipizide  Hydrochlorothiazide 25 mg tablet Oral  MetFORMIN HCl - 500 MG Oral Tablet Oral  Namenda Xr 28 mg capsule sprinkle, extended release 24 hr Oral  Onglyza 5 mg tablet Oral  Perindopril Erbumine 4 MG Oral Tablet Oral  Trospium Chloride 20 mg tablet 1 tablet PO BID  Vitamin B-12 100 mcg tablet Oral     GU PSH: Bladder Instill AntiCA Agent - 2016 Catheterize For Residual - 03/23/2018 Cysto Bladder Ureth Biopsy - 2018 Cysto Uretero Lithotripsy - 2010 Cystoscopy - 08/10/2018, 03/23/2018, 09/24/2017, 05/21/2017, 2018, 2018 Cystoscopy Fulguration - 2010 Cystoscopy Insert Stent, Right - 01/26/2019, Right - 10/17/2018, Right - 08/29/2018, 2015, 2010, 2010, 2008 Cystoscopy TURBT <2 cm - 08/29/2018, 11/11/2017, 2018, 2017, 2015, 2013 Cystoscopy TURBT 2-5 cm - 10/17/2018, 2017, 2016, 2016 Cystoscopy Ureteroscopy, Right - 10/17/2018, Right - 08/29/2018, Left - 2018, 2015 Locm  300-399Mg /Ml Iodine,1Ml - 09/21/2018 Percut Stone Removal >2cm - 2015 Ureteroscopic stone removal - 2008       PSH Notes: Cystoscopy With Fulguration Medium Lesion (2-5cm), Cystoscopy With Fulguration Medium Lesion (2-5cm), Bladder Injection Of Cancer Treatment, Percutaneous Lithotomy For Stone Over 2cm., Cystoscopy With Insertion Of Ureteral Stent Left, Cystoscopy With Ureteroscopy Left, Cystoscopy With Fulguration Small Lesion (5-1mm), Cystoscopy With Fulguration Small Lesion (5-81mm), Cystoscopy With Pyeloscopy With Lithotripsy, Cystoscopy With Insertion Of Ureteral Stent Right, Cystoscopy With Fulguration, Cystoscopy With Insertion Of Ureteral Stent Right, Cystoscopy With Ureteroscopy With Removal Of Calculus, Cystoscopy With Insertion Of Ureteral Stent Left   NON-GU PSH: None   GU PMH: Ureteral obstruction - 09/20/2018 Urinary Frequency - 09/20/2018 Bladder Cancer overlapping sites - 2017, Malignant neoplasm of overlapping sites of bladder, - 2017 Urinary Urgency, Urinary urgency - 2016 BPH w/LUTS, Benign prostatic hyperplasia (BPH) with straining on urination - 2016 Renal calculus, Nephrolithiasis - 2016      PMH Notes:   [name] [name] is a patient previously followed by Dr. Tresa Endo. 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 He was noted to have a recurrence in 2013 and underwent TURBT in July 2013. He was also incidentally noted to have a stricture of the distal left ureter on RPG studies at that time and a brush biopsy was performed. He was found to have high grade Ta urothelial carcinoma in February 2016.    Oct 2007: TURBT, Low grade Ta  Jul 2013: TURBT (  right bladder neck 1.5 cm tumor, low grade Ta),brush biopsy of distal left ureteral stricture (no malignancy)  Mar 2015: TURBT - Low grade Ta  Feb 2016: TURBT - High grade Ta, [facility]  Mar-Apr  2016: Induction BCG  Jul 2016: Maintenance BCG  Oct 2016: Multiple recurrent tumors noted on surveillance cystoscopy  Dec 2016: TUR - Multiple tumors over right posterior bladder - High grade, Ta  Dec 2016 - Feb 2017: 6 week induction BCG  May 2017: TURBT - High grade, Ta urothelial carcinoma (two tumors)  Oct 2017: Completed induction 6 week course of intravesical gemcitabine [facility] - Dr. Ricky Ala)  Apr 2018: Two small recurrences (each < 1 cm)  Apr 2018: TURBT- High grade Ta, concern for possible lamina propria invasion for tumor at dome  May 2018: Repeat TURBT - No residual malignancy  Jul-Aug 2018: Repeat induction 6 week course of intravesical gemcitabine [facility]- Dr. Ricky Ala)  Sep 2018 - Apr 2019: Monthly maintenance gemcitabine  May 2019: Blue light cystoscopy with TURBT for 2 recurrent tumors posteriorly - High grade, Ta  Jun-Jul 2019: Repeat induction intravesical gemcitabine  Mar 2020: TURBT for two recurrent tumors - High grade, muscle invasive urothelial carcinoma with invasion into distal right ureter, right ureteral stent  Apr 2020: Patient and wife elected trimodality therapy (refused cystectomy due to severe dementia), repeat completion TURBT and right ureteral stent change  May-Jun 2020: XRT and carboplatin    2) Nephrolithiasis: He has a long standing history of calcium oxalate stones. He was incidentally noted to have a large partial staghorn calculus during retrograde pyelography in February 2015.  Apr 2015: L PCNL   3) Prostate cancer screening: We discontinued screening in 2014 due to his low risk for prostate cancer at that time and progressive dementia.  Last PSA: 0.35 (June 2014)   4) BPH/LUTS: He is treated with Avodart.   5) Right ureteral obstruction: This was noted in March 2020 when he developed hydronephrosis due to progression of bladder cancer and development of muscle invasive disease. He began management with right ureteral stent in March 2020:   Mar 2020: R  ureteral stent placement (encrusted within 6 weeks  Apr 2020: R ureteral stent change (6 x 26 Bard Inlay Optima)  Aug 2020: R ureteral stent change (6 x 26 Bard Inlay Optima)     NON-GU PMH: Bacteriuria (Stable) - 12/14/2018, (Stable), - 09/20/2018 (Stable), - 09/02/2018 (Stable), - 2018 (Stable), - 2018, - 2018 Diabetes Type 2 Hypertension Unspecified dementia without behavioral disturbance    FAMILY HISTORY: No pertinent family history - Other Prostate Cancer - No Family History   SOCIAL HISTORY: Marital Status: Married Preferred Language: English; Ethnicity: Not Hispanic Or Latino; Race: White Current Smoking Status: Patient has never smoked.  Has never drank.  Drinks 2 caffeinated drinks per day. Has not had a blood transfusion.     Notes: Never A Smoker   REVIEW OF SYSTEMS:    GU Review Male:   Patient reports frequent urination, hard to postpone urination, get up at night to urinate, and leakage of urine. Patient denies burning/ pain with urination, stream starts and stops, trouble starting your streams, and have to strain to urinate .  Gastrointestinal (Upper):   Patient denies nausea and vomiting.  Gastrointestinal (Lower):   Patient denies diarrhea and constipation.  Constitutional:   Patient denies fever, night sweats, weight loss, and fatigue.  Skin:   Patient denies itching and skin rash/ lesion.  Eyes:   Patient denies blurred vision  and double vision.  Ears/ Nose/ Throat:   Patient denies sore throat and sinus problems.  Hematologic/Lymphatic:   Patient denies swollen glands and easy bruising.  Cardiovascular:   Patient denies leg swelling and chest pains.  Respiratory:   Patient denies cough and shortness of breath.  Endocrine:   Patient denies excessive thirst.  Musculoskeletal:   Patient denies back pain and joint pain.  Neurological:   Patient denies headaches and dizziness.  Psychologic:   Patient denies depression and anxiety.   Notes: Gets up 6-7 times nightly  to urinate.    VITAL SIGNS:     Weight 156 lb / 70.76 kg  Height 70 in / 177.8 cm  BMI 22.4 kg/m   MULTI-SYSTEM PHYSICAL EXAMINATION:    Constitutional: Well-nourished. No physical deformities. Normally developed. Good grooming.  Respiratory: No labored breathing, no use of accessory muscles.   Cardiovascular: Normal temperature, normal extremity pulses, no swelling, no varicosities.       ASSESSMENT:      ICD-10 Details  1 GU:   Ureteral obstruction - N13.1   2   Bladder Cancer overlapping sites - C67.8   3   Urinary Urgency - R39.15    PLAN:         1. Right ureteral obstruction: His urine will be cultured today. He will be scheduled for cystoscopy and right ureteral stent change in November.   2. Urothelial carcinoma of the bladder: He has now completed treatment. Cystoscopy will be performed at the time of his stent change for ongoing monitoring for local recurrence.   3. LUTS: His symptoms persist under likely multifactorial related to his prior radiation therapy an indwelling right ureteral stent. There also appears to be a significant factor related to his worsening dementia. He has failed all medical therapy. We did briefly discussed PTNS although I do not expect that he would likely have beneficial results. His wife is in agreement. Continue observation.

## 2019-05-08 ENCOUNTER — Other Ambulatory Visit: Payer: Self-pay

## 2019-05-08 ENCOUNTER — Encounter (HOSPITAL_COMMUNITY): Payer: Self-pay | Admitting: Emergency Medicine

## 2019-05-08 ENCOUNTER — Ambulatory Visit (HOSPITAL_COMMUNITY): Payer: Medicare Other | Admitting: Anesthesiology

## 2019-05-08 ENCOUNTER — Ambulatory Visit (HOSPITAL_COMMUNITY)
Admission: RE | Admit: 2019-05-08 | Discharge: 2019-05-08 | Disposition: A | Discharge status: Home / Self Care | Payer: Medicare Other | Attending: Urology | Admitting: Urology

## 2019-05-08 ENCOUNTER — Ambulatory Visit (HOSPITAL_COMMUNITY): Payer: Medicare Other

## 2019-05-08 ENCOUNTER — Ambulatory Visit (HOSPITAL_COMMUNITY): Payer: Medicare Other | Admitting: Physician Assistant

## 2019-05-08 ENCOUNTER — Encounter (HOSPITAL_COMMUNITY)
Admission: RE | Disposition: A | Discharge status: Home / Self Care | Payer: Self-pay | Source: Home / Self Care | Attending: Urology

## 2019-05-08 DIAGNOSIS — Z79899 Other long term (current) drug therapy: Secondary | ICD-10-CM | POA: Insufficient documentation

## 2019-05-08 DIAGNOSIS — I10 Essential (primary) hypertension: Secondary | ICD-10-CM | POA: Insufficient documentation

## 2019-05-08 DIAGNOSIS — Z7984 Long term (current) use of oral hypoglycemic drugs: Secondary | ICD-10-CM | POA: Diagnosis not present

## 2019-05-08 DIAGNOSIS — N401 Enlarged prostate with lower urinary tract symptoms: Secondary | ICD-10-CM | POA: Insufficient documentation

## 2019-05-08 DIAGNOSIS — R350 Frequency of micturition: Secondary | ICD-10-CM | POA: Insufficient documentation

## 2019-05-08 DIAGNOSIS — C678 Malignant neoplasm of overlapping sites of bladder: Secondary | ICD-10-CM | POA: Diagnosis not present

## 2019-05-08 DIAGNOSIS — Z923 Personal history of irradiation: Secondary | ICD-10-CM | POA: Diagnosis not present

## 2019-05-08 DIAGNOSIS — Z8042 Family history of malignant neoplasm of prostate: Secondary | ICD-10-CM | POA: Diagnosis not present

## 2019-05-08 DIAGNOSIS — Z882 Allergy status to sulfonamides status: Secondary | ICD-10-CM | POA: Diagnosis not present

## 2019-05-08 DIAGNOSIS — N135 Crossing vessel and stricture of ureter without hydronephrosis: Secondary | ICD-10-CM | POA: Insufficient documentation

## 2019-05-08 DIAGNOSIS — N131 Hydronephrosis with ureteral stricture, not elsewhere classified: Secondary | ICD-10-CM | POA: Diagnosis not present

## 2019-05-08 DIAGNOSIS — Z87442 Personal history of urinary calculi: Secondary | ICD-10-CM | POA: Diagnosis not present

## 2019-05-08 DIAGNOSIS — E119 Type 2 diabetes mellitus without complications: Secondary | ICD-10-CM | POA: Diagnosis not present

## 2019-05-08 DIAGNOSIS — Z881 Allergy status to other antibiotic agents status: Secondary | ICD-10-CM | POA: Diagnosis not present

## 2019-05-08 DIAGNOSIS — F0390 Unspecified dementia without behavioral disturbance: Secondary | ICD-10-CM | POA: Diagnosis not present

## 2019-05-08 DIAGNOSIS — C679 Malignant neoplasm of bladder, unspecified: Secondary | ICD-10-CM | POA: Diagnosis not present

## 2019-05-08 DIAGNOSIS — Z9221 Personal history of antineoplastic chemotherapy: Secondary | ICD-10-CM | POA: Diagnosis not present

## 2019-05-08 HISTORY — PX: CYSTOSCOPY W/ URETERAL STENT PLACEMENT: SHX1429

## 2019-05-08 LAB — GLUCOSE, CAPILLARY: Glucose-Capillary: 164 mg/dL — ABNORMAL HIGH (ref 70–99)

## 2019-05-08 SURGERY — CYSTOSCOPY, FLEXIBLE, WITH STENT REPLACEMENT
Anesthesia: General | Laterality: Right

## 2019-05-08 MED ORDER — CELECOXIB 200 MG PO CAPS
200.0000 mg | ORAL_CAPSULE | Freq: Once | ORAL | Status: AC
  Administered 2019-05-08: 200 mg via ORAL
  Filled 2019-05-08: qty 1

## 2019-05-08 MED ORDER — LIDOCAINE 2% (20 MG/ML) 5 ML SYRINGE
INTRAMUSCULAR | Status: DC | PRN
  Administered 2019-05-08: 100 mg via INTRAVENOUS

## 2019-05-08 MED ORDER — FENTANYL CITRATE (PF) 100 MCG/2ML IJ SOLN
INTRAMUSCULAR | Status: AC
  Filled 2019-05-08: qty 2

## 2019-05-08 MED ORDER — ONDANSETRON HCL 4 MG/2ML IJ SOLN
INTRAMUSCULAR | Status: DC | PRN
  Administered 2019-05-08: 4 mg via INTRAVENOUS

## 2019-05-08 MED ORDER — LACTATED RINGERS IV SOLN
INTRAVENOUS | Status: DC
  Administered 2019-05-08: 09:00:00 via INTRAVENOUS

## 2019-05-08 MED ORDER — PROPOFOL 10 MG/ML IV BOLUS
INTRAVENOUS | Status: AC
  Filled 2019-05-08: qty 20

## 2019-05-08 MED ORDER — ACETAMINOPHEN 500 MG PO TABS
1000.0000 mg | ORAL_TABLET | Freq: Once | ORAL | Status: AC
  Administered 2019-05-08: 1000 mg via ORAL
  Filled 2019-05-08: qty 2

## 2019-05-08 MED ORDER — LIDOCAINE 2% (20 MG/ML) 5 ML SYRINGE
INTRAMUSCULAR | Status: AC
  Filled 2019-05-08: qty 5

## 2019-05-08 MED ORDER — ONDANSETRON HCL 4 MG/2ML IJ SOLN
INTRAMUSCULAR | Status: AC
  Filled 2019-05-08: qty 2

## 2019-05-08 MED ORDER — FENTANYL CITRATE (PF) 100 MCG/2ML IJ SOLN: 25.0000 ug | INTRAMUSCULAR | Status: DC | PRN

## 2019-05-08 MED ORDER — 0.9 % SODIUM CHLORIDE (POUR BTL) OPTIME
TOPICAL | Status: DC | PRN
  Administered 2019-05-08: 1000 mL

## 2019-05-08 MED ORDER — DEXAMETHASONE SODIUM PHOSPHATE 10 MG/ML IJ SOLN
INTRAMUSCULAR | Status: DC | PRN
  Administered 2019-05-08: 8 mg via INTRAVENOUS

## 2019-05-08 MED ORDER — PROMETHAZINE HCL 25 MG/ML IJ SOLN: 6.2500 mg | INTRAMUSCULAR | Status: DC | PRN

## 2019-05-08 MED ORDER — DEXAMETHASONE SODIUM PHOSPHATE 10 MG/ML IJ SOLN
INTRAMUSCULAR | Status: AC
  Filled 2019-05-08: qty 1

## 2019-05-08 MED ORDER — STERILE WATER FOR IRRIGATION IR SOLN
Status: DC | PRN
  Administered 2019-05-08: 3000 mL

## 2019-05-08 MED ORDER — PROPOFOL 500 MG/50ML IV EMUL
INTRAVENOUS | Status: DC | PRN
  Administered 2019-05-08: 100 mg via INTRAVENOUS

## 2019-05-08 MED ORDER — FENTANYL CITRATE (PF) 100 MCG/2ML IJ SOLN
INTRAMUSCULAR | Status: DC | PRN
  Administered 2019-05-08: 25 ug via INTRAVENOUS

## 2019-05-08 MED ORDER — CEFAZOLIN SODIUM-DEXTROSE 2-4 GM/100ML-% IV SOLN
2.0000 g | Freq: Once | INTRAVENOUS | Status: AC
  Administered 2019-05-08: 2 g via INTRAVENOUS
  Filled 2019-05-08: qty 100

## 2019-05-08 SURGICAL SUPPLY — 13 items
BAG URO CATCHER STRL LF (MISCELLANEOUS) ×3 IMPLANT
CATH INTERMIT  6FR 70CM (CATHETERS) ×1 IMPLANT
CLOTH BEACON ORANGE TIMEOUT ST (SAFETY) ×1 IMPLANT
GLOVE BIOGEL M STRL SZ7.5 (GLOVE) ×3 IMPLANT
GOWN STRL REUS W/TWL LRG LVL3 (GOWN DISPOSABLE) ×4 IMPLANT
GUIDEWIRE STR DUAL SENSOR (WIRE) ×3 IMPLANT
KIT TURNOVER KIT A (KITS) ×2 IMPLANT
MANIFOLD NEPTUNE II (INSTRUMENTS) ×3 IMPLANT
PACK CYSTO (CUSTOM PROCEDURE TRAY) ×3 IMPLANT
STENT URO INLAY 6FRX26CM (STENTS) ×2 IMPLANT
TUBING CONNECTING 10 (TUBING) ×2 IMPLANT
TUBING CONNECTING 10' (TUBING) ×1
TUBING UROLOGY SET (TUBING) ×2 IMPLANT

## 2019-05-08 NOTE — Anesthesia Procedure Notes (Signed)
Procedure Name: LMA Insertion Date/Time: 05/08/2019 10:00 AM Performed by: Gerald Leitz, CRNA Pre-anesthesia Checklist: Patient identified, Patient being monitored, Timeout performed, Emergency Drugs available and Suction available Patient Re-evaluated:Patient Re-evaluated prior to induction Oxygen Delivery Method: Circle system utilized Preoxygenation: Pre-oxygenation with 100% oxygen Induction Type: IV induction Ventilation: Mask ventilation without difficulty LMA: LMA inserted LMA Size: 4.0 Tube type: Oral Number of attempts: 1 Placement Confirmation: positive ETCO2 and breath sounds checked- equal and bilateral Tube secured with: Tape Dental Injury: Teeth and Oropharynx as per pre-operative assessment

## 2019-05-08 NOTE — Anesthesia Postprocedure Evaluation (Signed)
Anesthesia Post Note  Patient: [name] [name]  Procedure(s) Performed: CYSTOSCOPY WITH RIGHT STENT CHANGE (Right )     Patient location during evaluation: PACU Anesthesia Type: General Level of consciousness: sedated Pain management: pain level controlled Vital Signs Assessment: post-procedure vital signs reviewed and stable Respiratory status: spontaneous breathing and respiratory function stable Cardiovascular status: stable Postop Assessment: no apparent nausea or vomiting Anesthetic complications: no    Last Vitals:  Vitals:   05/08/19 1115 05/08/19 1130  BP: 118/65 119/62  Pulse: 66 73  Resp: 12 15  Temp:  36.7 C  SpO2: 97% 96%    Last Pain:  Vitals:   05/08/19 1130  TempSrc:   PainSc: 0-No pain                 Manav Pierotti DANIEL

## 2019-05-08 NOTE — Op Note (Signed)
Preoperative diagnosis:  1. Right ureteral obstruction 2. Bladder cancer   Postoperative diagnosis:  1. Right ureteral obstruction 2. Bladder cancer   Procedure:  1. Cystoscopy 2. Right ureteral stent placement (6 x 26 Bard Inlay Optima - no string)  Surgeon: Roxy Horseman, Brooke Bonito. M.D.  Anesthesia: General  Complications: None  Intraoperative findings: His stent was moderately to severely encrusted.  EBL: Minimal  Specimens: None  Indication: [name] [name] is a 78 y.o. patient with right ureteral obstruction s/p radiation and chemotherapy for curative treatment of muscle invasive bladder cancer. After reviewing the management options for treatment, he elected to proceed with the above surgical procedure(s). We have discussed the potential benefits and risks of the procedure, side effects of the proposed treatment, the likelihood of the patient achieving the goals of the procedure, and any potential problems that might occur during the procedure or recuperation. Informed consent has been obtained.  Description of procedure:  The patient was taken to the operating room and general anesthesia was induced.  The patient was placed in the dorsal lithotomy position, prepped and draped in the usual sterile fashion, and preoperative antibiotics were administered. A preoperative time-out was performed.   Cystourethroscopy was performed.  The patient's urethra was examined and was unremarkable. The bladder was then systematically examined in its entirety. There was no evidence for any bladder tumors, stones, or other mucosal pathology.  He did have expected radiation changes but no mucosal areas suspicious for malignancy after systematic inspection with a 30 degree and 70 degree lens.  Attention then turned to the right ureteral orifice and the patient's indwelling ureteral stent was identified and brought out to the urethral meatus with the flexible graspers.  A 0.38 sensor guidewire  was then advanced up the right ureter into the renal pelvis under fluoroscopic guidance.  The wire was then backloaded through the cystoscope and a ureteral stent was advance over the wire using Seldinger technique.  The stent was positioned appropriately under fluoroscopic and cystoscopic guidance.  The wire was then removed with an adequate stent curl noted in the renal pelvis as well as in the bladder.  The bladder was then emptied and the procedure ended.  The patient appeared to tolerate the procedure well and without complications.  The patient was able to be awakened and transferred to the recovery unit in satisfactory condition.    Pryor Curia MD

## 2019-05-08 NOTE — Discharge Instructions (Addendum)

## 2019-05-08 NOTE — Anesthesia Preprocedure Evaluation (Addendum)
Anesthesia Evaluation  Patient identified by MRN, date of birth, ID band Patient awake    Reviewed: Allergy & Precautions, NPO status , Patient's Chart, lab work & pertinent test results  History of Anesthesia Complications Negative for: history of anesthetic complications  Airway Mallampati: II  TM Distance: >3 FB Neck ROM: Full    Dental no notable dental hx.    Pulmonary neg pulmonary ROS,    Pulmonary exam normal breath sounds clear to auscultation       Cardiovascular hypertension, Normal cardiovascular exam Rhythm:Regular Rate:Normal     Neuro/Psych PSYCHIATRIC DISORDERS Dementia negative neurological ROS     GI/Hepatic negative GI ROS, Neg liver ROS,   Endo/Other  diabetes  Renal/GU Renal InsufficiencyRenal disease  negative genitourinary   Musculoskeletal negative musculoskeletal ROS (+)   Abdominal   Peds negative pediatric ROS (+)  Hematology negative hematology ROS (+)   Anesthesia Other Findings   Reproductive/Obstetrics negative OB ROS                             Anesthesia Physical  Anesthesia Plan  ASA: III  Anesthesia Plan: General   Post-op Pain Management:    Induction: Intravenous  PONV Risk Score and Plan: 2 and Ondansetron and Dexamethasone  Airway Management Planned: LMA  Additional Equipment:   Intra-op Plan:   Post-operative Plan: Extubation in OR  Informed Consent: I have reviewed the patients History and Physical, chart, labs and discussed the procedure including the risks, benefits and alternatives for the proposed anesthesia with the patient or authorized representative who has indicated his/her understanding and acceptance.     Dental advisory given and Consent reviewed with POA  Plan Discussed with: CRNA, Surgeon and Anesthesiologist  Anesthesia Plan Comments: (Wife present for interview)       Anesthesia Quick Evaluation

## 2019-05-08 NOTE — Transfer of Care (Signed)
Immediate Anesthesia Transfer of Care Note  Patient: Stephane Aukerman  Procedure(s) Performed: Procedure(s): CYSTOSCOPY WITH RIGHT STENT CHANGE (Right)  Patient Location: PACU  Anesthesia Type:General  Level of Consciousness: Alert, Awake, Oriented  Airway & Oxygen Therapy: Patient Spontanous Breathing  Post-op Assessment: Report given to RN  Post vital signs: Reviewed and stable  Last Vitals:  Vitals:   05/08/19 0823  BP: 138/61  Pulse: 88  Resp: 18  Temp: 36.7 C  SpO2: [id number]    Complications: No apparent anesthesia complications

## 2019-05-08 NOTE — Progress Notes (Signed)
At the end of his procedure, he was noted to have a draining sinus just below his left ear.  I discussed this with his wife.  He is under the care of Dr. Jarome Matin for this issue according to his wife.  I encouraged her to call to see Dr. Ronnald Ramp in the next couple of days or to see Mr. Vasiliou PCP for further evaluation.

## 2019-05-09 ENCOUNTER — Encounter (HOSPITAL_COMMUNITY): Payer: Self-pay | Admitting: Urology

## 2019-05-09 DIAGNOSIS — Z85828 Personal history of other malignant neoplasm of skin: Secondary | ICD-10-CM | POA: Diagnosis not present

## 2019-05-09 DIAGNOSIS — D485 Neoplasm of uncertain behavior of skin: Secondary | ICD-10-CM | POA: Diagnosis not present

## 2019-05-09 DIAGNOSIS — D044 Carcinoma in situ of skin of scalp and neck: Secondary | ICD-10-CM | POA: Diagnosis not present

## 2019-05-17 DIAGNOSIS — Z882 Allergy status to sulfonamides status: Secondary | ICD-10-CM | POA: Diagnosis not present

## 2019-05-17 DIAGNOSIS — C7989 Secondary malignant neoplasm of other specified sites: Secondary | ICD-10-CM | POA: Diagnosis not present

## 2019-05-17 DIAGNOSIS — Z881 Allergy status to other antibiotic agents status: Secondary | ICD-10-CM | POA: Diagnosis not present

## 2019-05-17 DIAGNOSIS — Z888 Allergy status to other drugs, medicaments and biological substances status: Secondary | ICD-10-CM | POA: Diagnosis not present

## 2019-05-17 DIAGNOSIS — C4442 Squamous cell carcinoma of skin of scalp and neck: Secondary | ICD-10-CM | POA: Diagnosis not present

## 2019-05-22 DIAGNOSIS — Z961 Presence of intraocular lens: Secondary | ICD-10-CM | POA: Diagnosis not present

## 2019-05-22 DIAGNOSIS — H524 Presbyopia: Secondary | ICD-10-CM | POA: Diagnosis not present

## 2019-05-22 DIAGNOSIS — H348112 Central retinal vein occlusion, right eye, stable: Secondary | ICD-10-CM | POA: Diagnosis not present

## 2019-06-05 DIAGNOSIS — Z01812 Encounter for preprocedural laboratory examination: Secondary | ICD-10-CM | POA: Diagnosis not present

## 2019-06-05 DIAGNOSIS — E119 Type 2 diabetes mellitus without complications: Secondary | ICD-10-CM | POA: Diagnosis not present

## 2019-06-05 DIAGNOSIS — Z452 Encounter for adjustment and management of vascular access device: Secondary | ICD-10-CM | POA: Diagnosis not present

## 2019-06-05 DIAGNOSIS — C07 Malignant neoplasm of parotid gland: Secondary | ICD-10-CM | POA: Diagnosis not present

## 2019-06-05 DIAGNOSIS — Z7984 Long term (current) use of oral hypoglycemic drugs: Secondary | ICD-10-CM | POA: Diagnosis not present

## 2019-06-05 DIAGNOSIS — Z20828 Contact with and (suspected) exposure to other viral communicable diseases: Secondary | ICD-10-CM | POA: Diagnosis not present

## 2019-06-12 DIAGNOSIS — C7989 Secondary malignant neoplasm of other specified sites: Secondary | ICD-10-CM | POA: Diagnosis not present

## 2019-06-12 DIAGNOSIS — C44329 Squamous cell carcinoma of skin of other parts of face: Secondary | ICD-10-CM | POA: Diagnosis not present

## 2019-06-12 HISTORY — PX: NECK DISSECTION: SUR422

## 2019-06-12 HISTORY — PX: PAROTIDECTOMY: SHX2163

## 2019-06-19 DIAGNOSIS — Z4803 Encounter for change or removal of drains: Secondary | ICD-10-CM | POA: Diagnosis not present

## 2019-06-27 DIAGNOSIS — C801 Malignant (primary) neoplasm, unspecified: Secondary | ICD-10-CM | POA: Diagnosis not present

## 2019-06-27 DIAGNOSIS — C792 Secondary malignant neoplasm of skin: Secondary | ICD-10-CM | POA: Diagnosis not present

## 2019-07-12 DIAGNOSIS — C7989 Secondary malignant neoplasm of other specified sites: Secondary | ICD-10-CM | POA: Diagnosis not present

## 2019-07-13 ENCOUNTER — Other Ambulatory Visit: Payer: Self-pay

## 2019-07-13 ENCOUNTER — Ambulatory Visit (HOSPITAL_COMMUNITY)
Admission: RE | Admit: 2019-07-13 | Discharge: 2019-07-13 | Disposition: A | Discharge status: Home / Self Care | Payer: Medicare Other | Source: Ambulatory Visit | Attending: Oncology | Admitting: Oncology

## 2019-07-13 ENCOUNTER — Inpatient Hospital Stay: Payer: Medicare Other | Attending: Oncology

## 2019-07-13 DIAGNOSIS — C679 Malignant neoplasm of bladder, unspecified: Secondary | ICD-10-CM | POA: Diagnosis not present

## 2019-07-13 DIAGNOSIS — C672 Malignant neoplasm of lateral wall of bladder: Secondary | ICD-10-CM | POA: Diagnosis not present

## 2019-07-13 DIAGNOSIS — Z5111 Encounter for antineoplastic chemotherapy: Secondary | ICD-10-CM | POA: Diagnosis not present

## 2019-07-13 LAB — CBC WITH DIFFERENTIAL (CANCER CENTER ONLY)
Abs Immature Granulocytes: 0.05 10*3/uL (ref 0.00–0.07)
Basophils Absolute: 0 10*3/uL (ref 0.0–0.1)
Basophils Relative: 0 %
Eosinophils Absolute: 0.2 10*3/uL (ref 0.0–0.5)
Eosinophils Relative: 2 %
HCT: 32.6 % — ABNORMAL LOW (ref 39.0–52.0)
Hemoglobin: 10.6 g/dL — ABNORMAL LOW (ref 13.0–17.0)
Immature Granulocytes: 1 %
Lymphocytes Relative: 7 %
Lymphs Abs: 0.7 10*3/uL (ref 0.7–4.0)
MCH: 26.8 pg (ref 26.0–34.0)
MCHC: 32.5 g/dL (ref 30.0–36.0)
MCV: 82.3 fL (ref 80.0–100.0)
Monocytes Absolute: 0.8 10*3/uL (ref 0.1–1.0)
Monocytes Relative: 9 %
Neutro Abs: 7.4 10*3/uL (ref 1.7–7.7)
Neutrophils Relative %: 81 %
Platelet Count: 305 10*3/uL (ref 150–400)
RBC: 3.96 MIL/uL — ABNORMAL LOW (ref 4.22–5.81)
RDW: 14.5 % (ref 11.5–15.5)
WBC Count: 9.2 10*3/uL (ref 4.0–10.5)
nRBC: 0 % (ref 0.0–0.2)

## 2019-07-13 LAB — CMP (CANCER CENTER ONLY)
ALT: 8 U/L (ref 0–44)
AST: 13 U/L — ABNORMAL LOW (ref 15–41)
Albumin: 3.4 g/dL — ABNORMAL LOW (ref 3.5–5.0)
Alkaline Phosphatase: 66 U/L (ref 38–126)
Anion gap: 10 (ref 5–15)
BUN: 22 mg/dL (ref 8–23)
CO2: 31 mmol/L (ref 22–32)
Calcium: 9.1 mg/dL (ref 8.9–10.3)
Chloride: 99 mmol/L (ref 98–111)
Creatinine: 1.26 mg/dL — ABNORMAL HIGH (ref 0.61–1.24)
GFR, Est AFR Am: >60 mL/min (ref >60)
GFR, Estimated: 55 mL/min — ABNORMAL LOW (ref >60)
Glucose, Bld: 215 mg/dL — ABNORMAL HIGH (ref 70–99)
Potassium: 3.7 mmol/L (ref 3.5–5.1)
Sodium: 140 mmol/L (ref 135–145)
Total Bilirubin: 0.5 mg/dL (ref 0.3–1.2)
Total Protein: 6.5 g/dL (ref 6.5–8.1)

## 2019-07-13 MED ORDER — IOHEXOL 9 MG/ML PO SOLN
500.0000 mL | ORAL | Status: AC
  Administered 2019-07-13: 500 mL via ORAL

## 2019-07-13 MED ORDER — SODIUM CHLORIDE (PF) 0.9 % IJ SOLN
INTRAMUSCULAR | Status: AC
  Filled 2019-07-13: qty 50

## 2019-07-13 MED ORDER — IOHEXOL 300 MG/ML  SOLN
100.0000 mL | Freq: Once | INTRAMUSCULAR | Status: AC | PRN
  Administered 2019-07-13: 100 mL via INTRAVENOUS

## 2019-07-13 MED ORDER — IOHEXOL 9 MG/ML PO SOLN
ORAL | Status: AC
  Filled 2019-07-13: qty 500

## 2019-07-13 MED ORDER — SODIUM CHLORIDE 0.9 % IV SOLN
INTRAVENOUS | Status: AC
  Filled 2019-07-13: qty 250

## 2019-07-18 DIAGNOSIS — C7989 Secondary malignant neoplasm of other specified sites: Secondary | ICD-10-CM | POA: Diagnosis not present

## 2019-07-18 DIAGNOSIS — R8271 Bacteriuria: Secondary | ICD-10-CM | POA: Diagnosis not present

## 2019-07-18 DIAGNOSIS — Z9089 Acquired absence of other organs: Secondary | ICD-10-CM | POA: Diagnosis not present

## 2019-07-18 DIAGNOSIS — R3915 Urgency of urination: Secondary | ICD-10-CM | POA: Diagnosis not present

## 2019-07-18 DIAGNOSIS — N131 Hydronephrosis with ureteral stricture, not elsewhere classified: Secondary | ICD-10-CM | POA: Diagnosis not present

## 2019-07-18 DIAGNOSIS — Z483 Aftercare following surgery for neoplasm: Secondary | ICD-10-CM | POA: Diagnosis not present

## 2019-07-20 ENCOUNTER — Inpatient Hospital Stay: Payer: Medicare Other | Attending: Oncology | Admitting: Oncology

## 2019-07-20 ENCOUNTER — Telehealth: Payer: Self-pay | Admitting: Oncology

## 2019-07-20 ENCOUNTER — Other Ambulatory Visit: Payer: Self-pay

## 2019-07-20 VITALS — BP 99/66 | HR 96 | Temp 97.3°F | Resp 18 | Ht 70.0 in | Wt 155.5 lb

## 2019-07-20 DIAGNOSIS — F0390 Unspecified dementia without behavioral disturbance: Secondary | ICD-10-CM | POA: Diagnosis not present

## 2019-07-20 DIAGNOSIS — C672 Malignant neoplasm of lateral wall of bladder: Secondary | ICD-10-CM | POA: Diagnosis not present

## 2019-07-20 DIAGNOSIS — C4492 Squamous cell carcinoma of skin, unspecified: Secondary | ICD-10-CM | POA: Insufficient documentation

## 2019-07-20 DIAGNOSIS — R630 Anorexia: Secondary | ICD-10-CM | POA: Diagnosis not present

## 2019-07-20 DIAGNOSIS — C679 Malignant neoplasm of bladder, unspecified: Secondary | ICD-10-CM | POA: Insufficient documentation

## 2019-07-20 DIAGNOSIS — R5383 Other fatigue: Secondary | ICD-10-CM | POA: Insufficient documentation

## 2019-07-20 NOTE — Progress Notes (Signed)
Hematology and Oncology Follow Up Visit  [name] [name] [id number] [birth year] 79 y.o. 07/20/2019 10:29 AM [name] [name], MDRamachandran, [name], MD   Principle Diagnosis: 79 year old man with T2N0 high-grade urothelial carcinoma of the bladder diagnosed in March 2020.  a.    Prior Therapy:   He is status post cystoscopy and repeat TURBT on May 4 of 2020 under the care of Dr. Alinda Money.  Which showed no muscle invasion.  He is status post TURBT on August 29, 2018 which showed muscle invasive high-grade urothelial carcinoma.  He is status post radiation and weekly carboplatin with cycle 1 to be given on 11/15/2018.  He completed 6 cycles of therapy on 12/27/2018.  He is status post parotidectomy, neck dissection and skin flap on June 12, 2019 after presenting with infra-auricular squamous cell carcinoma.    Current therapy: Active surveillance.  Interim History: [name] returns today for a repeat evaluation.  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  He is recovering at this time and under evaluation for skin graft in the near future.  He has tolerated this procedure well although he is experiencing some anorexia and fatigue.  He denies any dysphagia or difficulty breathing.         Medications: Reviewed and updated today. Current Outpatient Medications  Medication Sig Dispense Refill  . dutasteride (AVODART) 0.5 MG capsule Take 0.5 mg by mouth daily.     [name] glipiZIDE (GLUCOTROL) 5 MG tablet Take 2.5 mg by mouth daily before breakfast.     . hydrochlorothiazide (HYDRODIURIL) 25 MG tablet Take 25 mg by mouth daily.     . memantine (NAMENDA XR) 28 MG CP24 24 hr capsule Take 28 mg by mouth every morning.     . metFORMIN (GLUCOPHAGE) 500 MG tablet Take 1,000 mg by mouth 2 (two) times daily with a meal.    . metoprolol succinate  (TOPROL-XL) 25 MG 24 hr tablet Take 25 mg by mouth daily.     . Multiple Vitamin (MULTIVITAMIN WITH MINERALS) TABS tablet Take 1 tablet by mouth every evening.    . Omega-3 Fatty Acids (FISH OIL) 1200 MG CAPS Take 1,200 mg by mouth every evening.    [name] DELICA LANCETS [id number] MISC 2 (two) times daily. as directed  4  . perindopril (ACEON) 4 MG tablet Take 4 mg by mouth daily.     . saxagliptin HCl (ONGLYZA) 5 MG TABS tablet Take 5 mg by mouth daily.     . vitamin B-12 (CYANOCOBALAMIN) 500 MCG tablet Take 500 mcg by mouth every evening.     No current facility-administered medications for this visit.     Allergies:  Allergies  Allergen Reactions  . Sulfa Antibiotics Hives  . Tetracyclines & Related Rash  . Aricept [Donepezil Hcl] Nausea And Vomiting        Physical Exam:  Blood pressure 99/66, pulse 96, temperature (!) 97.3 F (36.3 C), temperature source Temporal, resp. rate 18, height 5\' 10"  (1.778 m), weight 155 lb 8 oz (70.5 kg), SpO2 99 %.  Alcohol loss this can be injury ECOG: 1      General appearance: Comfortable appearing without any discomfort Head: Normocephalic without any trauma Oropharynx: Mucous membranes are moist and pink without any thrush or ulcers. Eyes: Pupils are equal and round reactive to light. Lymph nodes: No cervical, supraclavicular, inguinal or axillary lymphadenopathy.  Heart:regular rate and rhythm.  S1 and S2 without leg edema. Lung: Clear without any rhonchi or wheezes.  No dullness to percussion. Abdomin: Soft, nontender, nondistended with good bowel sounds.  No hepatosplenomegaly. Musculoskeletal: No joint deformity or effusion.  Full range of motion noted. Neurological: No deficits noted on motor, sensory and deep tendon reflex exam. Skin: Well-healed scar noted on his left neck.  Mild erythema noted.         Lab Results: Lab Results  Component Value Date   WBC 9.2 07/13/2019   HGB 10.6 (L) 07/13/2019   HCT 32.6 (L)  07/13/2019   MCV 82.3 07/13/2019   PLT 305 07/13/2019     Chemistry      Component Value Date/Time   NA 140 07/13/2019 1024   K 3.7 07/13/2019 1024   CL 99 07/13/2019 1024   CO2 31 07/13/2019 1024   BUN 22 07/13/2019 1024   CREATININE 1.26 (H) 07/13/2019 1024      Component Value Date/Time   CALCIUM 9.1 07/13/2019 1024   ALKPHOS 66 07/13/2019 1024   AST 13 (L) 07/13/2019 1024   ALT 8 07/13/2019 1024   BILITOT 0.5 07/13/2019 1024      IMPRESSION: 1. Unchanged post treatment appearance of the urinary bladder with mild, right eccentric thickening. No discrete mass noted. 2. Right double-J ureteral stent remains in position with formed pigtails in the right renal pelvis and urinary bladder. Mild, persistent right hydronephrosis and mild periureteral stranding. 3. No evidence of lymphadenopathy or distant metastatic disease within the chest, abdomen or pelvis. 4. Unchanged fine centrilobular nodularity throughout the lungs, most conspicuous in the upper lobes. This is consistent with smoking-related respiratory bronchiolitis or other nonspecific atypical infection or inflammatory inhalational process. Findings are generally not consistent with metastatic disease. 5. Cholelithiasis. 6.  Aortic Atherosclerosis (ICD10-I70.0).     Impression and Plan:  79 year old with:  1.  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  He remains on active surveillance at this time after completing definitive therapy with radiation and chemotherapy.  CT scan obtained on July 13, 2019 was personally reviewed and showed no evidence of relapsed disease.  Risks and benefits of continuing this approach as well as salvage therapy for bladder cancer was discussed today.  At this time I recommended continued active surveillance with repeat imaging studies in 6 months.  He will continue to have repeat cystoscopy under the care of Dr. Alinda Money.   2.  Dementia: No  recent decline noted at this time.   3.  Renal function surveillance: Creatinine clearance remains closer to baseline and improved as of late.  No exacerbation after completing chemotherapy.  4.  Anorexia: We have discussed strategies to improve his intake including nutritional supplements.  5.  The squamous cell carcinoma of the skin diagnosed in December 2020: He presented with a left infra-auricular mass with lymph node involvement.  He status post surgical resection and under evaluation to receive definitive radiation therapy adjuvantly.  No evidence of metastatic disease on the CT scan at this time.  We will continue with active surveillance after radiation.  6.  Follow-up: He will return in 6 months for repeat imaging studies.   30  minutes was spent on this encounter.  Time was dedicated to reviewing his disease status, reviewing imaging studies, treatment options for the future and outlining plan of care.    Zola Button, MD 2/4/202110:29 AM

## 2019-07-20 NOTE — Telephone Encounter (Signed)
Scheduled appt per 2/4 los.  Sent a a message to HIM pool to get a calendar mailed out.

## 2019-07-21 ENCOUNTER — Other Ambulatory Visit: Payer: Self-pay | Admitting: Urology

## 2019-07-21 DIAGNOSIS — Z20828 Contact with and (suspected) exposure to other viral communicable diseases: Secondary | ICD-10-CM | POA: Diagnosis not present

## 2019-07-24 ENCOUNTER — Telehealth: Payer: Self-pay | Admitting: *Deleted

## 2019-07-24 NOTE — Telephone Encounter (Addendum)
Oncology Nurse Navigator Documentation  In follow-up to this morning's telephone referral received from Dr. Servando Salina office, placed introductory call to new referral patient [name] [name]. Spoke with his wife as he has advanced dementia.  [name] [name] underwent L parotidectomy 06/12/2019 with Dr. Nicolette Bang, surgical path indicated 2 LN positive for carcinoma.  Introduced myself as the H&N oncology nurse navigator that works with Dr. Isidore Moos to whom [name] [name] has been referred by Dr. Nicolette Bang.  She confirmed understanding of referral.  Briefly explained my role as their navigator, provided my contact information.   Confirmed indication from Dr. Servando Salina office [name] [name] is having skin graft to L neck this Friday, appt with Dr. Isidore Moos to be scheduled 2/19 or week after.  She indicated he is scheduled 2/22 for stent replacement with Dr. Alinda Money.  I explained the purpose of a dental evaluation prior to starting RT, indicated he would be contacted by [facility] to arrange an appt.    She indicated familiarity with [facility] location/registration procedures as [name] [name] is under the care of Dr. Alen Blew for bladder cancer.  She stated they have been arriving to [facility] via back entrance as an accomodation for [name] [name] medical needs.  I encouraged her to call with questions/concerns as he moves forward with appts and procedures.  She agreed to do so, expressed appreciation for my call.  Navigator Initial Assessment . Employment Status:  retired . Currently on FMLA / STD: no . Living Situation: lives with wife . Support System: wife, adult son who lives next door to them . PCP: yes . PCD: yes . Financial Concerns: no . Transportation Needs: no . Sensory Deficits: no . Language Barriers/Interpreter Needed:  no . Ambulation Needs: no . DME Used in Home: no . Psychosocial Needs:  no . Concerns/Needs Understanding Cancer:  addressed/answered by navigator to best of ability . Self-Expressed Needs: no

## 2019-07-25 ENCOUNTER — Ambulatory Visit
Admission: RE | Admit: 2019-07-25 | Discharge: 2019-07-25 | Disposition: A | Discharge status: Home / Self Care | Payer: Self-pay | Source: Ambulatory Visit | Attending: Radiation Oncology | Admitting: Radiation Oncology

## 2019-07-25 ENCOUNTER — Telehealth: Payer: Self-pay | Admitting: Radiation Oncology

## 2019-07-25 ENCOUNTER — Other Ambulatory Visit: Payer: Self-pay | Admitting: *Deleted

## 2019-07-25 DIAGNOSIS — C7989 Secondary malignant neoplasm of other specified sites: Secondary | ICD-10-CM

## 2019-07-25 NOTE — Telephone Encounter (Signed)
New message: ° ° °LVM for patient to return call to schedule appt from referral received. °

## 2019-07-26 ENCOUNTER — Encounter: Payer: Self-pay | Admitting: *Deleted

## 2019-07-26 NOTE — Patient Instructions (Addendum)
DUE TO COVID-19 ONLY ONE VISITOR IS ALLOWED TO COME WITH YOU AND STAY IN THE WAITING ROOM ONLY DURING PRE OP AND PROCEDURE DAY OF SURGERY. THE 1 VISITOR MAY VISIT WITH YOU AFTER SURGERY IN YOUR PRIVATE ROOM DURING VISITING HOURS ONLY! 10:15 YOU NEED TO HAVE A COVID 19 TEST ON_2/18/21______ @__10 :15_____, THIS TEST MUST BE DONE BEFORE SURGERY, COME  [address], [place] , [id number].  (Rebecca) ONCE YOUR COVID TEST IS COMPLETED, PLEASE BEGIN THE QUARANTINE INSTRUCTIONS AS OUTLINED IN YOUR HANDOUT.                Ernesto Rutherford   Your procedure is scheduled on: 08/07/19   Report to [facility] Main  Entrance   Report to admitting at   9:30 AM     Call this number if you have problems the morning of surgery [phone]    Remember: Do not eat food or drink liquids after Midnight.   BRUSH YOUR TEETH MORNING OF SURGERY AND RINSE YOUR MOUTH OUT, NO CHEWING GUM CANDY OR MINTS.     Take these medicines the morning of surgery with A SIP OF WATER: Metoprolol, Dutasteride, Menantine  DO NOT TAKE ANY DIABETIC MEDICATIONS DAY OF YOUR SURGERY       How to Manage Your Diabetes Before and After Surgery  Why is it important to control my blood sugar before and after surgery? . Improving blood sugar levels before and after surgery helps healing and can limit problems. . A way of improving blood sugar control is eating a healthy diet by: o  Eating less sugar and carbohydrates o  Increasing activity/exercise o  Talking with your doctor about reaching your blood sugar goals . High blood sugars (greater than 180 mg/dL) can raise your risk of infections and slow your recovery, so you will need to focus on controlling your diabetes during the weeks before surgery. . Make sure that the doctor who takes care of your diabetes knows about your planned surgery including the date and location.  How do I manage my blood sugar before surgery? . Check your blood sugar at  least 4 times a day, starting 2 days before surgery, to make sure that the level is not too high or low. o Check your blood sugar the morning of your surgery when you wake up and every 2 hours until you get to the Short Stay unit. . If your blood sugar is less than 70 mg/dL, you will need to treat for low blood sugar: o Do not take insulin. o Treat a low blood sugar (less than 70 mg/dL) with  cup of clear juice (cranberry or apple), 4 glucose tablets, OR glucose gel. o Recheck blood sugar in 15 minutes after treatment (to make sure it is greater than 70 mg/dL). If your blood sugar is not greater than 70 mg/dL on recheck, call [phone] for further instructions. . Report your blood sugar to the short stay nurse when you get to Short Stay.  . If you are admitted to the hospital after surgery: o Your blood sugar will be checked by the staff and you will probably be given insulin after surgery (instead of oral diabetes medicines) to make sure you have good blood sugar levels. o The goal for blood sugar control after surgery is 80-180 mg/dL.   WHAT DO I DO ABOUT MY DIABETES MEDICATION?  [name] Do not take oral diabetes medicines (pills) the morning of surgery. [name]  Do not wear jewelry,  lotions, powders or  deodorant                     Men may shave face and neck.   Do not bring valuables to the hospital. [place].  Contacts, dentures or bridgework may not be worn into surgery.       Patients discharged the day of surgery will not be allowed to drive home.   IF YOU ARE HAVING SURGERY AND GOING HOME THE SAME DAY, YOU MUST HAVE AN ADULT TO DRIVE YOU HOME AND BE WITH YOU FOR 24 HOURS.   YOU MAY GO HOME BY TAXI OR UBER OR ORTHERWISE, BUT AN ADULT MUST ACCOMPANY YOU HOME AND STAY WITH YOU FOR 24 HOURS.  Name and phone number of your driver:  Special Instructions: N/A              Please read over the following fact sheets you  were given: _____________________________________________________________________             [facility] - Preparing for Surgery  Before surgery, you can play an important role.   Because skin is not sterile, your skin needs to be as free of germs as possible.   You can reduce the number of germs on your skin by washing with CHG (chlorahexidine gluconate) soap before surgery.   CHG is an antiseptic cleaner which kills germs and bonds with the skin to continue killing germs even after washing. Please DO NOT use if you have an allergy to CHG or antibacterial soaps .  If your skin becomes reddened/irritated stop using the CHG and inform your nurse when you arrive at Short Stay.   You may shave your face/neck.  Please follow these instructions carefully:  1.  Shower with CHG Soap the night before surgery and the  morning of Surgery.  2.  If you choose to wash your hair, wash your hair first as usual with your  normal  shampoo.  3.  After you shampoo, rinse your hair and body thoroughly to remove the  shampoo.                                        4.  Use CHG as you would any other liquid soap.  You can apply chg directly  to the skin and wash                       Gently with a scrungie or clean washcloth.  5.  Apply the CHG Soap to your body ONLY FROM THE NECK DOWN.   Do not use on face/ open                           Wound or open sores. Avoid contact with eyes, ears mouth and genitals (private parts).                       Wash face,  Genitals (private parts) with your normal soap.             6.  Wash thoroughly, paying special attention to the area where your surgery  will be performed.  7.  Thoroughly rinse  your body with warm water from the neck down.  8.  DO NOT shower/wash with your normal soap after using and rinsing off  the CHG Soap.             9.  Pat yourself dry with a clean towel.            10.  Wear clean pajamas.            11.  Place clean sheets on your bed the night  of your first shower and do not  sleep with pets. Day of Surgery : Do not apply any lotions/deodorants the morning of surgery.  Please wear clean clothes to the hospital/surgery center.  FAILURE TO FOLLOW THESE INSTRUCTIONS MAY RESULT IN THE CANCELLATION OF YOUR SURGERY PATIENT SIGNATURE_________________________________  NURSE SIGNATURE__________________________________  ________________________________________________________________________

## 2019-07-27 ENCOUNTER — Encounter (HOSPITAL_COMMUNITY): Payer: Self-pay

## 2019-07-27 ENCOUNTER — Encounter: Payer: Self-pay | Admitting: Radiation Oncology

## 2019-07-27 ENCOUNTER — Other Ambulatory Visit: Payer: Self-pay

## 2019-07-27 ENCOUNTER — Encounter (HOSPITAL_COMMUNITY)
Admission: RE | Admit: 2019-07-27 | Discharge: 2019-07-27 | Disposition: A | Discharge status: Home / Self Care | Payer: Medicare Other | Source: Ambulatory Visit | Attending: Urology | Admitting: Urology

## 2019-07-27 NOTE — Progress Notes (Signed)
Head and Neck Cancer Location of Tumor / Histology:  06/12/2019    Patient presented with symptoms of: He saw Dr. Nicolette Bang on 05/17/19 for an initial consult due his urologist noting a skin lesion to his neck during an appointment.   Biopsies of left parotid revealed: Invasive squamous cell carcinoma.   Nutrition Status Yes No Comments  Weight changes? [x]  []  He lost his appetite after his surgery in December. He has lost about 20 lbs since November. His wife states that his weight has fluctuated, but he has recently gained about 10 lbs back.   Swallowing concerns? []  [x]    PEG? []  [x]     Referrals Yes No Comments  Social Work? []  [x]    Dentistry? []  [x]    Swallowing therapy? []  [x]    Nutrition? []  [x]    Med/Onc? [x]  []  He is established with Dr. Alen Blew due to bladder cancer diagnosis with chemotherapy and radiation in July 2020.    Safety Issues Yes No Comments  Prior radiation? [x]  []  Radiation treatment dates:   11/09/2018 - 12/30/2018 Site/dose:    1. Bladder + Pelvic Nodes / 45 Gy in 25 fractions 2. Bladder Boost / 19.8 Gy in 11 fractions  Pacemaker/ICD? []  [x]    Possible current pregnancy? []  [x]    Is the patient on methotrexate? []  [x]     Tobacco/Marijuana/Snuff/ETOH use: He does not drink alcohol. He has never smoked.   Past/Anticipated interventions by otolaryngology, if any:  07/18/19 Office visit with Dr. 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 Pathology with 3.5 cm depth of invasion, into parotid, SCM, IJV, multiple positive margins, 2/18 nodes The tip of the advancement flap has necrosed, and was debrided in clinic. This will need a STSG placed in the OR - will schedule that for the near future. Will get him set up for postop RT in [place] after fully healed; he is very high risk for recurrence.  He is agreeable with this plan. All his questions were  answered.   **07/28/19 surgery planned for skin graft from leg by Dr. Nicolette Bang.     Past/Anticipated interventions by medical oncology, if any:  07/20/19 Dr. Alen Blew Impression and Plan: 79 year old with:  1.  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 He remains on active surveillance at this time after completing definitive therapy with radiation and chemotherapy.  CT scan obtained on July 13, 2019 was personally reviewed and showed no evidence of relapsed disease.  Risks and benefits of continuing this approach as well as salvage therapy for bladder cancer was discussed today.  At this time I recommended continued active surveillance with repeat imaging studies in 6 months.  He will continue to have repeat cystoscopy under the care of Dr. Alinda Money. 2.  Dementia: No recent decline noted at this time. 3.  Renal function surveillance: Creatinine clearance remains closer to baseline and improved as of late.  No exacerbation after completing chemotherapy. 4.  Anorexia: We have discussed strategies to improve his intake including nutritional supplements. 5.  The squamous cell carcinoma of the skin diagnosed in December 2020: He presented with a left infra-auricular mass with lymph node involvement.  He status post surgical resection and under evaluation to receive definitive radiation therapy adjuvantly.  No evidence of metastatic disease on the CT scan at this time.  We will continue with active surveillance after radiation. 6.  Follow-up: He will return in 6 months  for repeat imaging studies.   Current Complaints / other details:  Mr. Ovitt has dementia. His wife is his primary caregiver.

## 2019-07-27 NOTE — Progress Notes (Signed)
PCP Dr. Roselle Locus-  Cardiologist - none  Chest x-ray - 07/13/19 EKG - 01/09/19 Stress Test - no ECHO - 2014 Cardiac Cath - no  Sleep Study - no CPAP -   Fasting Blood Sugar - 149-169 Checks Blood Sugar _____ times a day every 2 weeks  Blood Thinner Instructions:NA Aspirin Instructions: Last Dose:  Anesthesia review:   Patient denies shortness of breath, fever, cough and chest pain at PAT appointment Pt's Wife is his  POH reports that he shows no sign of distress.  Patient verbalized understanding of instructions that were given to them at the PAT appointment. Patient was also instructed that they will need to review over the PAT instructions again at home before surgery. Pt's wife says Yes.

## 2019-07-28 DIAGNOSIS — S1190XA Unspecified open wound of unspecified part of neck, initial encounter: Secondary | ICD-10-CM | POA: Diagnosis not present

## 2019-07-28 DIAGNOSIS — E119 Type 2 diabetes mellitus without complications: Secondary | ICD-10-CM | POA: Diagnosis not present

## 2019-07-28 DIAGNOSIS — C07 Malignant neoplasm of parotid gland: Secondary | ICD-10-CM | POA: Diagnosis not present

## 2019-07-28 DIAGNOSIS — C4442 Squamous cell carcinoma of skin of scalp and neck: Secondary | ICD-10-CM | POA: Diagnosis not present

## 2019-07-28 DIAGNOSIS — Z7984 Long term (current) use of oral hypoglycemic drugs: Secondary | ICD-10-CM | POA: Diagnosis not present

## 2019-07-28 DIAGNOSIS — F0390 Unspecified dementia without behavioral disturbance: Secondary | ICD-10-CM | POA: Diagnosis not present

## 2019-07-31 NOTE — Progress Notes (Signed)
Radiation Oncology         [phone] ________________________________  Initial outpatient Consultation by telephone as patient was unable to access MyChart video during pandemic precautions   Name: [name] [name] MRN: [id number]  Date: 08/01/2019  DOB: 02-Mar-1941  [id number], [name] Kaufmann, MD  [name] Ames, MD   REFERRING PHYSICIAN: Francina Ames, MD  DIAGNOSIS:    ICD-10-CM   1. Cancer of skin of neck  C44.40    Cancer Staging Cancer of skin of neck Staging form: Cutaneous Carcinoma of the Head and Neck, AJCC 8th Edition - Clinical: Stage IV (cTX, cN3b, cM0) - Signed by [name] Gibson, MD on 08/02/2019   CHIEF COMPLAINT: Here to discuss management of left skin and parotid cancer  HISTORY OF PRESENT ILLNESS::[name] [name] is a 79 y.o. male who presented with a skin lesion. He has a history of bladder cancer, s/p surgery in 2015 and chemoradiation in 2020, and several areas of skin cancer. He was noted to have a skin lesion on his neck by his urologist, Dr. Alinda [name]. He underwent punch biopsy of the skin lesion on 05/09/2019 under Dr. Dian [name], with pathology showing squamous cell carcinoma in [name].  Subsequently, the patient saw Dr. Nicolette [name] who ordered neck CT to evaluate the extent of the skin cancer. Performed on 06/05/2019, neck CT revealed: mass involving lobes of left parotid extending to skin surface.   (It is unclear to me whether this cancer originated from the parotid gland or grew into the parotid gland, and patient's wife is not sure herself).  Dr. Nicolette [name] proceeded to parotidectomy on 06/12/2019. 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 An additional 15 from levels 2 and 3 were benign.  See path report below  Pertinent imaging thus far includes restaging chest/abdomen/pelvis CT for his bladder cancer performed on 07/13/2019 revealing unchanged post-treatment appearance of urinary bladder without discrete mass and no evidence of lymphadenopathy or distant metastatic disease.     Due to necrotic tissue in surgical field of neck, skin graft from leg placed on 2/12, has f/u w/ ENT 2/19.  Swallowing issues, if any: Denies  Weight Changes: He lost his appetite after surgery and lost 20 pounds since November but his wife reports that he has recently gained about 10 pounds back Wt Readings from Last 3 Encounters:  08/01/19 163 lb 4 oz (74 kg)  07/20/19 155 lb 8 oz (70.5 kg)  05/08/19 170 lb (77.1 kg)    Other symptoms: generalized hearing loss; baseline dementia; wife provides entire history today  Tobacco history, if any: none  ETOH abuse, if any: none  Prior cancers, if any: T2N0 high-grade bladder cancer diagnosed in March 2020; multiple skin cancers, wife states hard to keep track of their sites, but has had them on the left face.  Pathology: RECEIVED: 06/13/2019 ORDERING PHYSICIAN: [name] [name] , MD PATIENT NAME: [name] [name] SURGICAL PATHOLOGY REPORT  FINAL PATHOLOGIC DIAGNOSIS  MICROSCOPIC EXAMINATION AND DIAGNOSIS  A. LEFT NECK CONTENTS, LEVEL 2B, EXCISION: Four benign lymph nodes (0/4).  B. LEFT NECK CONTENTS, LEVELS 2A AND 3, EXCISION: Eleven benign lymph nodes (0/11).  C. LEFT TOTAL PAROTIDECTOMY WITH OVERLYING SKIN, STERNOCLEIDOMASTOID MUSCLE, INTERNAL JUGULAR, AND SUBMANDIBULAR CONTENTS: Invasive squamous cell  carcinoma with spindle cell features (spindle cell carcinoma). Tumor is poorly differentiated with a predominantly spindle cell growth pattern. Tumor size is 3.6 cm. Tumor thickness is 3.5 cm. Tumor invades into the parotid  gland. Tumor invades into myotendinous tissue and skeletal muscle (clinically sternocleidomastoid). Tumor invades into the wall of a muscular vein (clinically internal jugular). Perineural invasion is present. Margins are positive for carcinoma. Positive margins include the medial, deep, and superior. Carcinoma involves 2 of 3 lymph nodes by direct extension (2/3).  PREVIOUS RADIATION THERAPY: Yes  11/09/2018 - 12/30/2018: (Dr. Tammi [name])  1. Bladder + Pelvic Nodes / 45 Gy in 25 fractions  2. 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    PAST SURGICAL HISTORY: Past Surgical History:  Procedure Laterality Date  . BLADDER SURGERY  12-11-11   2007-tumor removal -stent placed and removed.[name] [name]  '08 for stone  . CATARACT EXTRACTION, BILATERAL     bil.  LEFT EYE 08-20-09    RT EYE 08-27-09  . COLONOSCOPY    . CYSTOSCOPY  05-16-2007  . cystoscopy  10-09-2008  . CYSTOSCOPY N/A 11/18/2015   Procedure: CYSTOSCOPY;  Surgeon: Raynelle Bring, MD;  Location: [facility];  Service: Urology;  Laterality: N/A;  . CYSTOSCOPY N/A 10/19/2016   Procedure: CYSTOSCOPY;  Surgeon: Raynelle Bring, MD;  Location: [facility];  Service: Urology;  Laterality: N/A;  . CYSTOSCOPY N/A 11/11/2017   Procedure: BLUE LIGHT CYSTOSCOPY WITH CYSVIEW;  Surgeon: Raynelle Bring, MD;  Location: [facility];  Service: Urology;  Laterality: N/A;  . CYSTOSCOPY W/ RETROGRADES  12/21/2011   Procedure: CYSTOSCOPY WITH RETROGRADE PYELOGRAM;  Surgeon: [name], MD;  Location: [facility];  Service: Urology;  Laterality: Bilateral;  . CYSTOSCOPY W/ RETROGRADES Bilateral 08/14/2013   Procedure: CYSTOSCOPY WITH BILATERAL  RETROGRADE PYELOGRAM/LEFT DIGITAL FLEXIBLE URETEROSCOPY/INSERTION LEFT URETERAL STENT;  Surgeon: [name], MD;  Location: [facility];  Service: Urology;  Laterality: Bilateral;  . CYSTOSCOPY W/ RETROGRADES Bilateral 07/23/2014   Procedure: CYSTOSCOPY WITH RETROGRADE PYELOGRAM;  Surgeon: Raynelle Bring, MD;  Location: [facility];  Service: Urology;  Laterality: Bilateral;  . CYSTOSCOPY W/ RETROGRADES Bilateral 05/13/2015   Procedure: CYSTOSCOPY WITH RETROGRADE PYELOGRAM;  Surgeon: Raynelle Bring, MD;  Location: [facility];  Service: Urology;  Laterality: Bilateral;  . CYSTOSCOPY W/ RETROGRADES Bilateral 03/02/2016   Procedure: CYSTOSCOPY, EXAM UNDER ANESTHESIA;  Surgeon: Raynelle Bring, MD;  Location: [facility];  Service: Urology;  Laterality: Bilateral;  . CYSTOSCOPY W/ RETROGRADES N/A 09/28/2016   Procedure: CYSTOSCOPY WITH RETROGRADE PYELOGRAM;  Surgeon: Raynelle Bring, MD;  [facility]: [facility];  Service: Urology;  Laterality: N/A;  . CYSTOSCOPY W/ URETERAL STENT PLACEMENT Right 05/08/2019   Procedure: CYSTOSCOPY WITH RIGHT STENT CHANGE;  Surgeon: Raynelle Bring, MD;  [facility];  Service: Urology;  Laterality: Right;  . CYSTOSCOPY WITH BIOPSY  12/21/2011   Procedure: CYSTOSCOPY WITH BIOPSY;  Surgeon: [name], MD;  [facility]: [facility];  Service: Urology;;  . Consuela Mimes WITH BIOPSY N/A 08/29/2018   Procedure: CYSTOSCOPY WITH BIOPSY OF BLADDER TUMOR/ TRANSURETHRAL  RESECTION OF BLADDER TUMOR/ RIGHT RETROGRADE PYELOGRAM/RIGHT STENT PLACEMENT;  Surgeon: Raynelle Bring, MD;  Location: [facility];  Service: Urology;  Laterality: N/A;  . CYSTOSCOPY WITH STENT PLACEMENT Right 01/26/2019   Procedure:  CYSTOSCOPY WITH STENT EXCHANGE;  Surgeon: Raynelle Bring, MD;  Location: [facility];  Service: Urology;  Laterality: Right;  . EXTRACORPOREAL SHOCK WAVE LITHOTRIPSY  2005  . EYE SURGERY    . LITHOTRIPSY  12-11-11   '05/ '10  . mose procedure  Left 11-12-15 and feb 2017  . NECK DISSECTION Left 06/12/2019   Procedure: neck dissection; Surgeon: [name] Kalata, MD; Location: [facility] MA[facility]; Service: ENT; Laterality: N/A;    . NEPHROLITHOTOMY Left 10/12/2013   Procedure: NEPHROLITHOTOMY PERCUTANEOUS;  Surgeon: [name], MD;  Location: [facility];  Service: Urology;  Laterality: Left;  . PAROTIDECTOMY Left 06/12/2019   Procedure: PAROTIDECTOMY; Surgeon: [name] Kalata, MD; [facility]; Service: ENT; Laterality: Left;    . stent to kidney  04-29-2007  . SUBDURAL HEMORRHAGE DRAINAGE     drainage per boreholes only 12-12-03  . TONSILLECTOMY  12-11-11   child  . TRANSURETHRAL RESECTION OF BLADDER TUMOR  12/21/2011   Procedure: TRANSURETHRAL RESECTION OF BLADDER TUMOR (TURBT);  Surgeon: [name], MD;  Location: [facility];  Service: Urology;  Laterality: N/A;     . TRANSURETHRAL RESECTION OF BLADDER TUMOR N/A 08/14/2013   Procedure: TRANSURETHRAL RESECTION OF BLADDER TUMOR (TURBT);  Surgeon: [name], MD;  Location: [facility];  Service: Urology;  Laterality: N/A;  . TRANSURETHRAL RESECTION OF BLADDER TUMOR N/A 07/23/2014   Procedure: TRANSURETHRAL RESECTION OF BLADDER TUMOR (TURBT) WITH MITOMYCIN INSTILLATION;  Surgeon: Raynelle Bring, MD;  Location: [facility];  Service: Urology;  Laterality: N/A;  . TRANSURETHRAL RESECTION OF BLADDER TUMOR N/A 05/13/2015   Procedure: TRANSURETHRAL RESECTION OF BLADDER TUMOR (TURBT);  Surgeon: Raynelle Bring, MD;  [facility]: [facility];  Service: Urology;  Laterality: N/A;  . TRANSURETHRAL RESECTION OF BLADDER TUMOR N/A 11/18/2015   Procedure: TRANSURETHRAL RESECTION OF BLADDER TUMOR (TURBT);  Surgeon: Raynelle Bring, MD;  Location: [facility];  Service: Urology;  Laterality:  N/A;  . TRANSURETHRAL RESECTION OF BLADDER TUMOR N/A 03/02/2016   Procedure: TRANSURETHRAL RESECTION OF BLADDER TUMOR (TURBT);  Surgeon: Raynelle Bring, MD;  [facility]: [facility];  Service: Urology;  Laterality: N/A;  . TRANSURETHRAL RESECTION OF BLADDER TUMOR N/A 09/28/2016   Procedure: TRANSURETHRAL RESECTION OF BLADDER TUMOR (TURBT);  Surgeon: Raynelle Bring, MD;  Location: [facility];  Service: Urology;  Laterality: N/A;  . TRANSURETHRAL RESECTION OF BLADDER TUMOR N/A 10/19/2016   Procedure: TRANSURETHRAL RESECTION OF BLADDER TUMOR (TURBT);  Surgeon: Raynelle Bring, MD;  Location: [facility];  Service: Urology;  Laterality: N/A;  NEEDS 30 MIN TOTAL FOR BOTH PROCEDURES  . TRANSURETHRAL RESECTION OF BLADDER TUMOR N/A 11/11/2017   Procedure: TRANSURETHRAL RESECTION OF BLADDER TUMOR (TURBT) WITH CYSTOSCOPY;  Surgeon: Raynelle Bring, MD;  Location: [facility];  Service: Urology;  Laterality: N/A;  . TRANSURETHRAL RESECTION OF BLADDER TUMOR N/A 10/17/2018   Procedure: TRANSURETHRAL RESECTION OF BLADDER TUMOR (TURBT) WITH CYSTOSCOPY, RIGHT URETEROSCOPY, RIGHT URETERAL STENT CHANGE;  Surgeon: Raynelle Bring, MD;  Location: [facility];  Service: Urology;  Laterality: N/A;  GENERAL ANESTHESIA WITH POSSIBLE PARALYSIS  . UPPER GI ENDOSCOPY      FAMILY HISTORY: family history includes Breast cancer in his sister; Colon cancer (age of onset: 76) in his brother.  SOCIAL HISTORY:  reports that he has never smoked. He has never used smokeless tobacco. He reports  that he does not drink alcohol or use drugs.  ALLERGIES: Sulfa antibiotics, Tetracyclines & related, and Aricept [donepezil hcl]  MEDICATIONS:  Current Outpatient Medications  Medication Sig Dispense Refill  . dutasteride (AVODART) 0.5 MG capsule Take 0.5 mg by mouth daily.     [name] glipiZIDE (GLUCOTROL) 5 MG tablet Take 2.5 mg by mouth daily before breakfast.     . hydrochlorothiazide (HYDRODIURIL) 25 MG tablet Take 25 mg by mouth daily.     . memantine (NAMENDA XR) 28 MG  CP24 24 hr capsule Take 28 mg by mouth every morning.     . metFORMIN (GLUCOPHAGE) 500 MG tablet Take 1,000 mg by mouth 2 (two) times daily with a meal.    . metoprolol succinate (TOPROL-XL) 25 MG 24 hr tablet Take 25 mg by mouth daily.     . Multiple Vitamin (MULTIVITAMIN WITH MINERALS) TABS tablet Take 1 tablet by mouth every evening.    . Omega-3 Fatty Acids (FISH OIL) 1200 MG CAPS Take 1,200 mg by mouth every evening.    [name] DELICA LANCETS [id number] MISC 2 (two) times daily. as directed  4  . perindopril (ACEON) 4 MG tablet Take 4 mg by mouth daily.     . saxagliptin HCl (ONGLYZA) 5 MG TABS tablet Take 5 mg by mouth daily.     . traMADol (ULTRAM) 50 MG tablet Take by mouth.    . vitamin B-12 (CYANOCOBALAMIN) 500 MCG tablet Take 500 mcg by mouth every evening.    . cephALEXin (KEFLEX) 500 MG capsule Take 500 mg by mouth 4 (four) times daily.    . traMADol (ULTRAM) 50 MG tablet      No current facility-administered medications for this encounter.    REVIEW OF SYSTEMS:  Notable for that above.   PHYSICAL EXAM:  vitals were not taken for this visit.     LABORATORY DATA:  Lab Results  Component Value Date   WBC 10.2 08/01/2019   HGB 10.4 (L) 08/01/2019   HCT 32.3 (L) 08/01/2019   MCV 85.0 08/01/2019   PLT 231 08/01/2019   CMP     Component Value Date/Time   NA 143 08/01/2019 1045   K 4.2 08/01/2019 1045   CL 103 08/01/2019 1045   CO2 28 08/01/2019 1045   GLUCOSE 176 (H) 08/01/2019 1045   BUN 23 08/01/2019 1045   CREATININE 1.14 08/01/2019 1045   CREATININE 1.26 (H) 07/13/2019 1024   CALCIUM 9.0 08/01/2019 1045   PROT 6.5 07/13/2019 1024   ALBUMIN 3.4 (L) 07/13/2019 1024   AST 13 (L) 07/13/2019 1024   ALT 8 07/13/2019 1024   ALKPHOS 66 07/13/2019 1024   BILITOT 0.5 07/13/2019 1024   GFRNONAA >60 08/01/2019 1045   GFRNONAA 55 (L) 07/13/2019 1024   GFRAA >60 08/01/2019 1045   GFRAA >60 07/13/2019 1024         Additional RADIOGRAPHY personally reviewed by me: CT  Chest W Contrast  Result Date: 07/13/2019 CLINICAL DATA:  Bladder cancer staging, status post chemotherapy and radiation therapy EXAM: CT CHEST, ABDOMEN, AND PELVIS WITH CONTRAST TECHNIQUE: Multidetector CT imaging of the chest, abdomen and pelvis was performed following the standard protocol during bolus administration of intravenous contrast. CONTRAST:  129mL OMNIPAQUE IOHEXOL 300 MG/ML SOLN, additional oral enteric contrast COMPARISON:  CT abdomen pelvis, 01/06/2019, 09/21/2018 FINDINGS: CT CHEST FINDINGS Cardiovascular: Scattered aortic atherosclerosis. Normal heart size. No pericardial effusion. Mediastinum/Nodes: No enlarged mediastinal, hilar, or axillary lymph nodes. Thyroid gland, trachea, and esophagus demonstrate  no significant findings. Lungs/Pleura: Redemonstrated scarring and volume loss of the left lower lobe with elevation of the left hemidiaphragm. Unchanged fine centrilobular nodularity throughout the lungs, most conspicuous in the upper lobes. No pleural effusion or pneumothorax. Musculoskeletal: No chest wall mass or suspicious bone lesions identified. CT ABDOMEN PELVIS FINDINGS Hepatobiliary: No solid liver abnormality is seen. No gallstones, gallbladder wall thickening, or biliary dilatation. Pancreas: Unremarkable. No pancreatic ductal dilatation or surrounding inflammatory changes. Spleen: Normal in size without significant abnormality. Adrenals/Urinary Tract: Adrenal glands are unremarkable. Right double-J ureteral stent remains in position with formed pigtails in the right renal pelvis and urinary bladder. Mild, persistent right hydronephrosis and mild periureteral stranding. Unchanged mild, right eccentric thickening of the posterior urinary bladder (series 2, image 117). Stomach/Bowel: Stomach is within normal limits. No evidence of bowel wall thickening, distention, or inflammatory changes. Vascular/Lymphatic: Aortic atherosclerosis. No enlarged abdominal or pelvic lymph nodes.  Reproductive: No mass or other abnormality. Other: No abdominal wall hernia or abnormality. No abdominopelvic ascites. Musculoskeletal: No acute or significant osseous findings. IMPRESSION: 1. Unchanged post treatment appearance of the urinary bladder with mild, right eccentric thickening. No discrete mass noted. 2. Right double-J ureteral stent remains in position with formed pigtails in the right renal pelvis and urinary bladder. Mild, persistent right hydronephrosis and mild periureteral stranding. 3. No evidence of lymphadenopathy or distant metastatic disease within the chest, abdomen or pelvis. 4. Unchanged fine centrilobular nodularity throughout the lungs, most conspicuous in the upper lobes. This is consistent with smoking-related respiratory bronchiolitis or other nonspecific atypical infection or inflammatory inhalational process. Findings are generally not consistent with metastatic disease. 5. Cholelithiasis. 6.  Aortic Atherosclerosis (ICD10-I70.0). Electronically Signed   By: Eddie Candle M.D.   On: 07/13/2019 13:12   CT Abdomen Pelvis W Contrast  Result Date: 07/13/2019 CLINICAL DATA:  Bladder cancer staging, status post chemotherapy and radiation therapy EXAM: CT CHEST, ABDOMEN, AND PELVIS WITH CONTRAST TECHNIQUE: Multidetector CT imaging of the chest, abdomen and pelvis was performed following the standard protocol during bolus administration of intravenous contrast. CONTRAST:  135mL OMNIPAQUE IOHEXOL 300 MG/ML SOLN, additional oral enteric contrast COMPARISON:  CT abdomen pelvis, 01/06/2019, 09/21/2018 FINDINGS: CT CHEST FINDINGS Cardiovascular: Scattered aortic atherosclerosis. Normal heart size. No pericardial effusion. Mediastinum/Nodes: No enlarged mediastinal, hilar, or axillary lymph nodes. Thyroid gland, trachea, and esophagus demonstrate no significant findings. Lungs/Pleura: Redemonstrated scarring and volume loss of the left lower lobe with elevation of the left hemidiaphragm.  Unchanged fine centrilobular nodularity throughout the lungs, most conspicuous in the upper lobes. No pleural effusion or pneumothorax. Musculoskeletal: No chest wall mass or suspicious bone lesions identified. CT ABDOMEN PELVIS FINDINGS Hepatobiliary: No solid liver abnormality is seen. No gallstones, gallbladder wall thickening, or biliary dilatation. Pancreas: Unremarkable. No pancreatic ductal dilatation or surrounding inflammatory changes. Spleen: Normal in size without significant abnormality. Adrenals/Urinary Tract: Adrenal glands are unremarkable. Right double-J ureteral stent remains in position with formed pigtails in the right renal pelvis and urinary bladder. Mild, persistent right hydronephrosis and mild periureteral stranding. Unchanged mild, right eccentric thickening of the posterior urinary bladder (series 2, image 117). Stomach/Bowel: Stomach is within normal limits. No evidence of bowel wall thickening, distention, or inflammatory changes. Vascular/Lymphatic: Aortic atherosclerosis. No enlarged abdominal or pelvic lymph nodes. Reproductive: No mass or other abnormality. Other: No abdominal wall hernia or abnormality. No abdominopelvic ascites. Musculoskeletal: No acute or significant osseous findings. IMPRESSION: 1. Unchanged post treatment appearance of the urinary bladder with mild, right eccentric thickening. No discrete mass noted.  2. Right double-J ureteral stent remains in position with formed pigtails in the right renal pelvis and urinary bladder. Mild, persistent right hydronephrosis and mild periureteral stranding. 3. No evidence of lymphadenopathy or distant metastatic disease within the chest, abdomen or pelvis. 4. Unchanged fine centrilobular nodularity throughout the lungs, most conspicuous in the upper lobes. This is consistent with smoking-related respiratory bronchiolitis or other nonspecific atypical infection or inflammatory inhalational process. Findings are generally not  consistent with metastatic disease. 5. Cholelithiasis. 6.  Aortic Atherosclerosis (ICD10-I70.0). Electronically Signed   By: Eddie Candle M.D.   On: 07/13/2019 13:12      IMPRESSION/PLAN:  This is a delightful patient with head and neck cancer, skin primary, involving parotid gland, postoperative with positive margins, high-grade, perineural invasion, multiple nodes. I recommend adjuvant radiotherapy for this patient.  We discussed the potential risks, benefits, and side effects of radiotherapy. We talked in detail about acute and late effects. We discussed that some of the most bothersome acute effects may be mucositis, dysgeusia, salivary changes, skin irritation, hair loss, dehydration, weight loss and fatigue. We talked about late effects which include but are not necessarily limited to dysphagia, hearing loss/inner ear injury, hypothyroidism, nerve injury, xerostomia,  and neck edema. No guarantees of treatment were given. A consent form was signed and placed in the patient's medical record. The patient is enthusiastic about proceeding with treatment. I look forward to participating in the patient's care.    We discussed hypofractionated versus a standard fractionation regimen.  The most standard approach would be to treat his cancer 5 days a week for 6-1/2 weeks.  His wife would like to proceed with this regimen and feels it is realistic for them to follow through on.  Simulation (treatment planning) will take place after he is released by dentistry; we will need to hold his radiation plan until ENT feels he has healed from his skin graft.  We also discussed that the treatment of head and neck cancer is a multidisciplinary process to maximize treatment outcomes and quality of life. For this reason the following referrals have been or will be made:    Dentistry for dental evaluation, static protection devices and /or advice on reducing risk of cavities, osteoradionecrosis, or other oral  issues.   Nutritionist for nutrition support during and after treatment.   Social work for social support.    Baseline labs including TSH.    Although he is at risk for further hearing loss from radiotherapy, he already has significant hearing loss and baseline evaluation by audiology was declined by his wife today.  This encounter was provided by telemedicine platform by telephone as patient was unable to access MyChart video during pandemic precautions The patient has given verbal consent for this type of encounter and has been advised to only accept a meeting of this type in a secure network environment. The time spent during this encounter was 60 minutes. The attendants for this meeting include [name] [name]  and Ernesto Rutherford and his wife, as well as Gayleen Orem, RN, our Head and Neck Oncology Navigator. During the encounter, [name] [name] was located at [facility] Radiation Oncology Department.  Demorris Janis was located at home.   -----------------------------------  [name] Gibson, MD   This document serves as a record of services personally performed by [name] Gibson, MD. It was created on her behalf by Wilburn Mylar, a trained medical scribe. The creation of this record is based on the scribe's personal observations  and the provider's statements to them. This document has been checked and approved by the attending provider.

## 2019-08-01 ENCOUNTER — Encounter (HOSPITAL_COMMUNITY)
Admission: RE | Admit: 2019-08-01 | Discharge: 2019-08-01 | Disposition: A | Discharge status: Home / Self Care | Payer: Medicare Other | Source: Ambulatory Visit | Attending: Urology | Admitting: Urology

## 2019-08-01 ENCOUNTER — Encounter: Payer: Self-pay | Admitting: *Deleted

## 2019-08-01 ENCOUNTER — Other Ambulatory Visit: Payer: Self-pay

## 2019-08-01 ENCOUNTER — Encounter: Payer: Self-pay | Admitting: Radiation Oncology

## 2019-08-01 ENCOUNTER — Ambulatory Visit
Admission: RE | Admit: 2019-08-01 | Discharge: 2019-08-01 | Disposition: A | Discharge status: Home / Self Care | Payer: Medicare Other | Source: Ambulatory Visit | Attending: Radiation Oncology | Admitting: Radiation Oncology

## 2019-08-01 DIAGNOSIS — Z8551 Personal history of malignant neoplasm of bladder: Secondary | ICD-10-CM | POA: Insufficient documentation

## 2019-08-01 DIAGNOSIS — C4440 Unspecified malignant neoplasm of skin of scalp and neck: Secondary | ICD-10-CM | POA: Diagnosis not present

## 2019-08-01 DIAGNOSIS — Z7984 Long term (current) use of oral hypoglycemic drugs: Secondary | ICD-10-CM | POA: Insufficient documentation

## 2019-08-01 DIAGNOSIS — C07 Malignant neoplasm of parotid gland: Secondary | ICD-10-CM | POA: Diagnosis not present

## 2019-08-01 DIAGNOSIS — E119 Type 2 diabetes mellitus without complications: Secondary | ICD-10-CM | POA: Insufficient documentation

## 2019-08-01 DIAGNOSIS — Z79899 Other long term (current) drug therapy: Secondary | ICD-10-CM | POA: Insufficient documentation

## 2019-08-01 DIAGNOSIS — I10 Essential (primary) hypertension: Secondary | ICD-10-CM | POA: Diagnosis not present

## 2019-08-01 DIAGNOSIS — Z923 Personal history of irradiation: Secondary | ICD-10-CM | POA: Diagnosis not present

## 2019-08-01 DIAGNOSIS — Z01812 Encounter for preprocedural laboratory examination: Secondary | ICD-10-CM | POA: Insufficient documentation

## 2019-08-01 DIAGNOSIS — C779 Secondary and unspecified malignant neoplasm of lymph node, unspecified: Secondary | ICD-10-CM | POA: Diagnosis not present

## 2019-08-01 LAB — BASIC METABOLIC PANEL
Anion gap: 12 (ref 5–15)
BUN: 23 mg/dL (ref 8–23)
CO2: 28 mmol/L (ref 22–32)
Calcium: 9 mg/dL (ref 8.9–10.3)
Chloride: 103 mmol/L (ref 98–111)
Creatinine, Ser: 1.14 mg/dL (ref 0.61–1.24)
GFR calc Af Amer: >60 mL/min (ref >60)
GFR calc non Af Amer: >60 mL/min (ref >60)
Glucose, Bld: 176 mg/dL — ABNORMAL HIGH (ref 70–99)
Potassium: 4.2 mmol/L (ref 3.5–5.1)
Sodium: 143 mmol/L (ref 135–145)

## 2019-08-01 LAB — CBC
HCT: 32.3 % — ABNORMAL LOW (ref 39.0–52.0)
Hemoglobin: 10.4 g/dL — ABNORMAL LOW (ref 13.0–17.0)
MCH: 27.4 pg (ref 26.0–34.0)
MCHC: 32.2 g/dL (ref 30.0–36.0)
MCV: 85 fL (ref 80.0–100.0)
Platelets: 231 10*3/uL (ref 150–400)
RBC: 3.8 MIL/uL — ABNORMAL LOW (ref 4.22–5.81)
RDW: 15 % (ref 11.5–15.5)
WBC: 10.2 10*3/uL (ref 4.0–10.5)
nRBC: 0 % (ref 0.0–0.2)

## 2019-08-01 LAB — HEMOGLOBIN A1C
Hgb A1c MFr Bld: 6.4 % — ABNORMAL HIGH (ref 4.8–5.6)
Mean Plasma Glucose: 136.98 mg/dL

## 2019-08-01 NOTE — Progress Notes (Signed)
Dental Form with Estimates of Radiation Dose      Diagnosis:    ICD-10-CM   1. 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 -----------------------------------  Eppie Gibson, MD

## 2019-08-02 ENCOUNTER — Encounter (HOSPITAL_COMMUNITY): Payer: Self-pay | Admitting: Dentistry

## 2019-08-02 ENCOUNTER — Encounter: Payer: Self-pay | Admitting: Radiation Oncology

## 2019-08-02 ENCOUNTER — Other Ambulatory Visit: Payer: Self-pay | Admitting: Radiation Oncology

## 2019-08-02 ENCOUNTER — Ambulatory Visit (HOSPITAL_COMMUNITY): Payer: Self-pay | Admitting: Dentistry

## 2019-08-02 VITALS — BP 130/71 | HR 88 | Temp 99.4°F

## 2019-08-02 DIAGNOSIS — Z01818 Encounter for other preprocedural examination: Secondary | ICD-10-CM

## 2019-08-02 DIAGNOSIS — K036 Deposits [accretions] on teeth: Secondary | ICD-10-CM

## 2019-08-02 DIAGNOSIS — K08409 Partial loss of teeth, unspecified cause, unspecified class: Secondary | ICD-10-CM

## 2019-08-02 DIAGNOSIS — C4440 Unspecified malignant neoplasm of skin of scalp and neck: Secondary | ICD-10-CM

## 2019-08-02 DIAGNOSIS — R634 Abnormal weight loss: Secondary | ICD-10-CM

## 2019-08-02 DIAGNOSIS — M264 Malocclusion, unspecified: Secondary | ICD-10-CM

## 2019-08-02 DIAGNOSIS — K0530 Chronic periodontitis, unspecified: Secondary | ICD-10-CM

## 2019-08-02 DIAGNOSIS — M278 Other specified diseases of jaws: Secondary | ICD-10-CM

## 2019-08-02 DIAGNOSIS — K06010 Localized gingival recession, unspecified: Secondary | ICD-10-CM

## 2019-08-02 DIAGNOSIS — C07 Malignant neoplasm of parotid gland: Secondary | ICD-10-CM

## 2019-08-02 DIAGNOSIS — C01 Malignant neoplasm of base of tongue: Secondary | ICD-10-CM | POA: Diagnosis not present

## 2019-08-02 MED ORDER — SODIUM FLUORIDE 1.1 % DT CREA: TOPICAL_CREAM | DENTAL | 99 refills | Status: DC

## 2019-08-02 NOTE — Addendum Note (Signed)
Addended by: Teena Dunk F on: 08/02/2019 01:41 PM   Modules accepted: Orders

## 2019-08-02 NOTE — Progress Notes (Signed)
DENTAL CONSULTATION  Date of Consultation:  08/02/2019 Patient Name:   [name] [name] Date of Birth:   18-Nov-1940 Medical Record Number: [id number]  COVID 19 SCREENING: The patient does not symptoms concerning for COVID-19 infection (Including fever, chills, cough, or new SHORTNESS OF BREATH).    VITALS: BP 130/71 (BP Location: Right Arm)   Pulse 88   Temp 99.4 F (37.4 C)   CHIEF COMPLAINT: Patient referred by Dr. Isidore Moos for dental consultation.  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  Patient is status post parotidectomy with Dr. Nicolette Bang on 06/02/2019.  Patient with anticipated postoperative radiation therapy.  Patient is now seen as part of a medically necessary preradiation therapy dental protocol examination.  The patient currently denies acute toothaches, swellings, or abscesses.  Patient was last seen by his primary dentist on 11/03/2018 for an exam, cleaning, and restoration on tooth #31.  This was with Dr. Lavella Hammock.  Patient is usually seen on an every 47-month basis but has not been able to do so due to COVID-19 restrictions.  Patient denies having any partial dentures.  Patient denies having any dental phobia.  PROBLEM LIST: Patient Active Problem List   Diagnosis Date Noted  . Severe sepsis ([place]) 01/06/2019  . Acute lower UTI 01/06/2019  . Dementia without behavioral disturbance ([place]) 01/06/2019  . Sepsis ([place]) 01/06/2019  . History of subdural hematoma 01/06/2019  . Cancer of lateral wall of urinary bladder ([place]) 03/10/2016  . Renal calculus, left 10/12/2013  . Memory loss 03/01/2013  . Essential hypertension 03/01/2013  . Type 2 diabetes mellitus without complication ([place]) [id number]    PMH: Past Medical History:  Diagnosis Date  . Alzheimer disease ([place])   . Alzheimer's disease ([place])   . Bilateral cold feet   . Bladder cancer ([place])    dx in 2013.07/08/19 skin CA in neck  . BPH  (benign prostatic hypertrophy)   . Dementia ([place]) 12-11-11   memory short term(steadily progressive worsening-pt. unaware) .dx. Alzheimers  . Diabetes mellitus 12-11-11   oral meds only type II   . 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 Hard of hearing   . Hearing loss    has one hearing aid, doesn't wear  . History of chemotherapy   . History of gallstones 2005  . History of kidney stones   . Hx of nonmelanoma skin cancer   . Hypercholesterolemia   . Hypertension 12-11-11   controlled with meds  . Increased frequency of urination    wife reports urinary frequency has not improved since last surgery   . Personal history of radiation exposure   . Pulmonary nodules    Bilateral  . Pyelonephritis    hospital admission july 2020  . Renal calculus, left 10/12/2013  . Urinary urgency   . UTI (urinary tract infection) 08-09-13   multiple, last tx. 1 week ago    PSH: Past Surgical History:  Procedure Laterality Date  . BLADDER SURGERY  12-11-11   2007-tumor removal -stent placed and removed.Lavell Islam  '08 for stone  . CATARACT EXTRACTION, BILATERAL     bil.  LEFT EYE 08-20-09    RT EYE 08-27-09  . COLONOSCOPY    . CYSTOSCOPY  05-16-2007  . cystoscopy  10-09-2008  . CYSTOSCOPY N/A 11/18/2015   Procedure: CYSTOSCOPY;  Surgeon: Raynelle Bring, MD;  Location: [facility];  Service: Urology;  Laterality: N/A;  . CYSTOSCOPY N/A 10/19/2016  Procedure: CYSTOSCOPY;  Surgeon: Raynelle Bring, MD;  Location: [facility];  Service: Urology;  Laterality: N/A;  . CYSTOSCOPY N/A 11/11/2017   Procedure: BLUE LIGHT CYSTOSCOPY WITH CYSVIEW;  Surgeon: Raynelle Bring, MD;  Location: [facility];  Service: Urology;  Laterality: N/A;  . CYSTOSCOPY W/ RETROGRADES  12/21/2011   Procedure: CYSTOSCOPY WITH RETROGRADE PYELOGRAM;  Surgeon: [name], MD;  Location: [facility];  Service: Urology;  Laterality: Bilateral;  . CYSTOSCOPY W/ RETROGRADES Bilateral 08/14/2013   Procedure: CYSTOSCOPY WITH BILATERAL   RETROGRADE PYELOGRAM/LEFT DIGITAL FLEXIBLE URETEROSCOPY/INSERTION LEFT URETERAL STENT;  Surgeon: [name], MD;  Location: [facility];  Service: Urology;  Laterality: Bilateral;  . CYSTOSCOPY W/ RETROGRADES Bilateral 07/23/2014   Procedure: CYSTOSCOPY WITH RETROGRADE PYELOGRAM;  Surgeon: Raynelle Bring, MD;  [facility]: [facility];  Service: Urology;  Laterality: Bilateral;  . CYSTOSCOPY W/ RETROGRADES Bilateral 05/13/2015   Procedure: CYSTOSCOPY WITH RETROGRADE PYELOGRAM;  Surgeon: Raynelle Bring, MD;  [facility];  Service: Urology;  Laterality: Bilateral;  . CYSTOSCOPY W/ RETROGRADES Bilateral 03/02/2016   Procedure: CYSTOSCOPY, EXAM UNDER ANESTHESIA;  Surgeon: Raynelle Bring, MD;  [facility];  Service: Urology;  Laterality: Bilateral;  . CYSTOSCOPY W/ RETROGRADES N/A 09/28/2016   Procedure: CYSTOSCOPY WITH RETROGRADE PYELOGRAM;  Surgeon: Raynelle Bring, MD;  [facility];  Service: Urology;  Laterality: N/A;  . CYSTOSCOPY W/ URETERAL STENT PLACEMENT Right 05/08/2019   Procedure: CYSTOSCOPY WITH RIGHT STENT CHANGE;  Surgeon: Raynelle Bring, MD;  [facility];  Service: Urology;  Laterality: Right;  . CYSTOSCOPY WITH BIOPSY  12/21/2011   Procedure: CYSTOSCOPY WITH BIOPSY;  Surgeon: [name], MD;  [facility];  Service: Urology;;  . Consuela Mimes WITH BIOPSY N/A 08/29/2018   Procedure: CYSTOSCOPY WITH BIOPSY OF BLADDER TUMOR/ TRANSURETHRAL RESECTION OF BLADDER TUMOR/ RIGHT RETROGRADE PYELOGRAM/RIGHT STENT PLACEMENT;  Surgeon: Raynelle Bring, MD;  [facility];  Service: Urology;  Laterality: N/A;  . CYSTOSCOPY WITH STENT PLACEMENT Right 01/26/2019   Procedure: CYSTOSCOPY WITH STENT EXCHANGE;  Surgeon: Raynelle Bring, MD;  [facility];  Service: Urology;  Laterality: Right;  . EXTRACORPOREAL SHOCK WAVE LITHOTRIPSY  2005  . EYE SURGERY    . LITHOTRIPSY  12-11-11   '05/ '10  . mose procedure  Left 11-12-15 and feb 2017  . NECK DISSECTION Left 06/12/2019   Procedure: neck dissection;  Surgeon: Theodoro Kalata, MD; Location: [facility] MA[facility]; Service: ENT; Laterality: N/A;    . NEPHROLITHOTOMY Left 10/12/2013   Procedure: NEPHROLITHOTOMY PERCUTANEOUS;  Surgeon: [name], MD;  [facility];  Service: Urology;  Laterality: Left;  . PAROTIDECTOMY Left 06/12/2019   Procedure: PAROTIDECTOMY; Surgeon: Theodoro Kalata, MD; Location[facility] MA[facility] OR; Service: ENT; Laterality: Left;    . stent to kidney  04-29-2007  . SUBDURAL HEMORRHAGE DRAINAGE     drainage per boreholes only 12-12-03  . TONSILLECTOMY  12-11-11   child  . TRANSURETHRAL RESECTION OF BLADDER TUMOR  12/21/2011   Procedure: TRANSURETHRAL RESECTION OF BLADDER TUMOR (TURBT);  Surgeon: [name], MD;  [facility];  Service: Urology;  Laterality: N/A;     . TRANSURETHRAL RESECTION OF BLADDER TUMOR N/A 08/14/2013   Procedure: TRANSURETHRAL RESECTION OF BLADDER TUMOR (TURBT);  Surgeon: [name], MD;  [facility];  Service: Urology;  Laterality: N/A;  . TRANSURETHRAL RESECTION OF BLADDER TUMOR N/A 07/23/2014   Procedure: TRANSURETHRAL RESECTION OF BLADDER TUMOR (TURBT) WITH MITOMYCIN INSTILLATION;  Surgeon: Raynelle Bring, MD;  [facility];  Service: Urology;  Laterality: N/A;  .  TRANSURETHRAL RESECTION OF BLADDER TUMOR N/A 05/13/2015   Procedure: TRANSURETHRAL RESECTION OF BLADDER TUMOR (TURBT);  Surgeon: Raynelle Bring, MD;  [facility];  Service: Urology;  Laterality: N/A;  . TRANSURETHRAL RESECTION OF BLADDER TUMOR N/A 11/18/2015   Procedure: TRANSURETHRAL RESECTION OF BLADDER TUMOR (TURBT);  Surgeon: Raynelle Bring, MD;  [facility];  Service: Urology;  Laterality: N/A;  . TRANSURETHRAL RESECTION OF BLADDER TUMOR N/A 03/02/2016   Procedure: TRANSURETHRAL RESECTION OF BLADDER TUMOR (TURBT);  Surgeon: Raynelle Bring, MD;  [facility];  Service: Urology;  Laterality: N/A;  . TRANSURETHRAL RESECTION OF BLADDER TUMOR N/A 09/28/2016   Procedure: TRANSURETHRAL RESECTION OF BLADDER TUMOR (TURBT);   Surgeon: Raynelle Bring, MD;  [facility];  Service: Urology;  Laterality: N/A;  . TRANSURETHRAL RESECTION OF BLADDER TUMOR N/A 10/19/2016   Procedure: TRANSURETHRAL RESECTION OF BLADDER TUMOR (TURBT);  Surgeon: Raynelle Bring, MD;  [facility];  Service: Urology;  Laterality: N/A;  NEEDS 30 MIN TOTAL FOR BOTH PROCEDURES  . TRANSURETHRAL RESECTION OF BLADDER TUMOR N/A 11/11/2017   Procedure: TRANSURETHRAL RESECTION OF BLADDER TUMOR (TURBT) WITH CYSTOSCOPY;  Surgeon: Raynelle Bring, MD;  [facility];  Service: Urology;  Laterality: N/A;  . TRANSURETHRAL RESECTION OF BLADDER TUMOR N/A 10/17/2018   Procedure: TRANSURETHRAL RESECTION OF BLADDER TUMOR (TURBT) WITH CYSTOSCOPY, RIGHT URETEROSCOPY, RIGHT URETERAL STENT CHANGE;  Surgeon: Raynelle Bring, MD;  [facility];  Service: Urology;  Laterality: N/A;  GENERAL ANESTHESIA WITH POSSIBLE PARALYSIS  . UPPER GI ENDOSCOPY      ALLERGIES: Allergies  Allergen Reactions  . Sulfa Antibiotics Hives  . Tetracyclines & Related Rash  . Aricept [Donepezil Hcl] Nausea And Vomiting    MEDICATIONS: Current Outpatient Medications  Medication Sig Dispense Refill  . cephALEXin (KEFLEX) 500 MG capsule Take 500 mg by mouth 4 (four) times daily.    [name] dutasteride (AVODART) 0.5 MG capsule Take 0.5 mg by mouth daily.     [name] glipiZIDE (GLUCOTROL) 5 MG tablet Take 2.5 mg by mouth daily before breakfast.     . hydrochlorothiazide (HYDRODIURIL) 25 MG tablet Take 25 mg by mouth daily.     . memantine (NAMENDA XR) 28 MG CP24 24 hr capsule Take 28 mg by mouth every morning.     . metFORMIN (GLUCOPHAGE) 500 MG tablet Take 1,000 mg by mouth 2 (two) times daily with a meal.    . metoprolol succinate (TOPROL-XL) 25 MG 24 hr tablet Take 25 mg by mouth daily.     . Multiple Vitamin (MULTIVITAMIN WITH MINERALS) TABS tablet Take 1 tablet by mouth every evening.    . Omega-3 Fatty Acids (FISH OIL) 1200 MG CAPS Take 1,200 mg by mouth every evening.    [name] DELICA  LANCETS [id number] MISC 2 (two) times daily. as directed  4  . perindopril (ACEON) 4 MG tablet Take 4 mg by mouth daily.     . saxagliptin HCl (ONGLYZA) 5 MG TABS tablet Take 5 mg by mouth daily.     . traMADol (ULTRAM) 50 MG tablet Take by mouth.    . traMADol (ULTRAM) 50 MG tablet     . vitamin B-12 (CYANOCOBALAMIN) 500 MCG tablet Take 500 mcg by mouth every evening.     No current facility-administered medications for this visit.    LABS: Lab Results  Component Value Date   WBC 10.2 08/01/2019   HGB 10.4 (L) 08/01/2019   HCT 32.3 (L) 08/01/2019   MCV 85.0 08/01/2019   PLT 231  08/01/2019      Component Value Date/Time   NA 143 08/01/2019 1045   K 4.2 08/01/2019 1045   CL 103 08/01/2019 1045   CO2 28 08/01/2019 1045   GLUCOSE 176 (H) 08/01/2019 1045   BUN 23 08/01/2019 1045   CREATININE 1.14 08/01/2019 1045   CREATININE 1.26 (H) 07/13/2019 1024   CALCIUM 9.0 08/01/2019 1045   GFRNONAA >60 08/01/2019 1045   GFRNONAA 55 (L) 07/13/2019 1024   GFRAA >60 08/01/2019 1045   GFRAA >60 07/13/2019 1024   Lab Results  Component Value Date   INR 1.2 01/07/2019   INR 1.08 10/12/2013   INR 0.92 09/28/2013   No results found for: PTT  SOCIAL HISTORY: Social History   Socioeconomic History  . Marital status: Married    Spouse name: Not on file  . Number of children: 2  . Years of education: Not on file  . Highest education level: Not on file  Occupational History    Comment: retired  Tobacco Use  . Smoking status: Never Smoker  . Smokeless tobacco: Never Used  Substance and Sexual Activity  . Alcohol use: No  . Drug use: No  . Sexual activity: Not Currently  Other Topics Concern  . Not on file  Social History Narrative   alzheimers   Social Determinants of Health   Financial Resource Strain:   . Difficulty of Paying Living Expenses: Not on file  Food Insecurity:   . Worried About Charity fundraiser in the Last Year: Not on file  . Ran Out of Food in the Last Year:  Not on file  Transportation Needs:   . Lack of Transportation (Medical): Not on file  . Lack of Transportation (Non-Medical): Not on file  Physical Activity:   . Days of Exercise per Week: Not on file  . Minutes of Exercise per Session: Not on file  Stress:   . Feeling of Stress : Not on file  Social Connections:   . Frequency of Communication with Friends and Family: Not on file  . Frequency of Social Gatherings with Friends and Family: Not on file  . Attends Religious Services: Not on file  . Active Member of Clubs or Organizations: Not on file  . Attends Archivist Meetings: Not on file  . Marital Status: Not on file  Intimate Partner Violence:   . Fear of Current or Ex-Partner: Not on file  . Emotionally Abused: Not on file  . Physically Abused: Not on file  . Sexually Abused: Not on file    FAMILY HISTORY: Family History  Problem Relation Age of Onset  . Breast cancer Sister   . Colon cancer Brother 47    REVIEW OF SYSTEMS: Reviewed with the patient as per History of present illness. Psych: Patient denies having dental phobia.  DENTAL HISTORY:  CHIEF COMPLAINT: Patient referred by Dr. Isidore Moos for dental consultation.  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  Patient is status post parotidectomy with Dr. Nicolette Bang on 06/02/2019.  Patient with anticipated postoperative radiation therapy.  Patient is now seen as part of a medically necessary preradiation therapy dental protocol examination.  The patient currently denies acute toothaches, swellings, or abscesses.  Patient was last seen by his primary dentist on 11/03/2018 for an exam, cleaning, and restoration on tooth #31.  This was with Dr. Lavella Hammock.  Patient is usually seen on an every 66-month basis but has  not been able to do so due to COVID-19 restrictions.  Patient denies having any partial dentures.  Patient denies having any dental  phobia.   DENTAL EXAMINATION: GENERAL: The patient is a well-nourished, well-developed male in no acute distress. HEAD AND NECK: There is no palpable right neck lymphadenopathy.  The left neck is consistent with previous neck dissection, rotted ectomy, and recent skin grafting procedure. INTRAORAL EXAM: Patient with normal saliva.  There is no evidence of oral abscess formation.  Patient has poor oral hygiene at this time.  There is evidence of multiple areas of lateral exostoses as per dental charting form. DENTITION: The patient is missing tooth numbers 1, 3, 13 through 16, 19, and 32. PERIODONTAL: Patient has chronic periodontitis with plaque and calculus accumulations, gingival recession, and no significant tooth mobility. DENTAL CARIES/SUBOPTIMAL RESTORATIONS: No obvious dental caries are noted at this time. ENDODONTIC: Patient currently denies acute vulvitis symptoms.  Patient has had previous root canal therapy associated with tooth numbers 6, 28, and 30 with no persistent symptoms. CROWN AND BRIDGE: Patient has multiple crown restorations noted. PROSTHODONTIC: Patient denies having a partial denture. OCCLUSION: Patient has a poor occlusal scheme but a stable occlusion at this time.  RADIOGRAPHIC INTERPRETATION: An orthopantogram and full series of dental radiographs were obtained today.  These are suboptimal secondary to lack of patient cooperation and patient movement. There are multiple missing teeth.  There is supra eruption drifting of the unopposed teeth into the edentulous areas.  There is incipient a moderate bone loss noted.  Multiple crown and bridge restorations are noted.  No obvious periapical radiolucencies are noted.  Patient has had previous root canal therapy associated with tooth numbers 6, 28, and 30.   ASSESSMENTS: 1.  Carcinoma of the left parotid gland-status post parotidectomy and flap reconstructive surgery 2.  Preradiation therapy dental protocol 3.  Chronic  periodontitis with bone loss 4.  Gingival recession 5.  Accretions 6.  Multiple missing teeth 7.  Supra eruption drifting of the unopposed teeth into the edentulous areas 8.  Multiple malpositioned teeth 9.  Poor occlusal scheme but a stable occlusion 10.  Multiple areas of lateral exostosis involving maxillary and mandibular arches.   PLAN/RECOMMENDATIONS: 1. I discussed the risks, benefits, and complications of various treatment options with the patient and his wife in relationship to his medical and dental conditions, anticipated radiation therapy, and radiation therapy side effects to include xerostomia, radiation caries, trismus, mucositis, taste changes, gum and jawbone changes, and risk for infection and osteoradionecrosis.. We discussed various treatment options to include no treatment, extraction teeth in the primary field radiation therapy, alveoloplasty, pre-prosthetic surgery as indicated, periodontal therapy, dental restorations, root canal therapy, crown and bridge therapy, implant therapy, and replacement of missing teeth as indicated.  We also discussed fabrication of fluoride trays and scatter protection devices.  No teeth will need to be extracted at this time since the teeth will not be in the primary field of radiation therapy per anticipated port and doses drawing by Dr. Isidore Moos.  The patient and wife currently wish to proceed with impressions today for the fabrication of scatter protection devices.  Fluoride trays will not be fabricated as the wife does not feel like the patient could comprehend performing use of fluoride trays on a nightly basis due to his dementia.  Patient will instead be given a prescription for Prevident 5000 toothpaste to use at bedtime on his toothbrush.  This prescription was sent to the [facility] pharmacy with refills for  1 year.  Scatter protection devices will be fabricated and inserted later next week.  The patient's wife will contact his primary dentist  concerning scheduling a dental cleaning prior to start of the radiation therapy.   2. Discussion of findings with medical team and coordination of future medical and dental care as needed.  I spent in excess of  120 minutes during the conduct of this consultation and >50% of this time involved direct face-to-face encounter for counseling and/or coordination of the patient's care.    Lenn Cal, DDS

## 2019-08-02 NOTE — Patient Instructions (Signed)
COVID-19 Education: The signs and symptoms of COVID-19 were discussed with the patient and how to seek care for testing (follow up with PCP or arrange E-visit).   The importance of social distancing was discussed today.  COVID-19 Vaccine Information can be found at: [email or web address] For questions related to vaccine distribution or appointments, please email [email or web address] or call [phone].    RADIATION THERAPY AND DECISIONS REGARDING YOUR TEETH  Xerostomia (dry mouth) Your salivary glands may be in the filed of radiation.  Radiation may include all or part of your saliva glands.  This will cause your saliva to dry up and you will have a dry mouth.  The dry mouth will be for the rest of your life unless your radiation oncologist tells you otherwise.  Your saliva has many functions:  Saliva wets your tongue for speaking.  It coats your teeth and the inside of your mouth for easier movement.  It helps with chewing and swallowing food.  It helps clean away harmful acid and toxic products made by the germs in your mouth, therefore it helps prevent cavities.  It kills some germs in your mouth and helps to prevent gum disease.  It helps to carry flavor to your taste buds.  Once you have lost your saliva you will be at higher risk for tooth decay and gum disease.  What can be done to help improve your mouth when there's not enough saliva:  1.  Your dentist may give a prescription for Salagen.  It will not bring back all of your saliva but may bring back some of it.  Also your saliva may be thick and ropy or white and foamy. It will not feel like it use to feel.  2.  You will need to swish with water every time your mouth feels dry.  YOU CANNOT suck on any cough drops, mints, lemon drops, candy, vitamin C or any other products.  You cannot use anything other than water to make your mouth feel less dry.  If you want to drink  anything else you have to drink it all at once and brush afterwards.  Be sure to discuss the details of your diet habits with your dentist or hygienist.  Radiation caries: This is decay that happens very quickly once your mouth is very dry due to radiation therapy.  Normally cavities take six months to two years to become a problem.  When you have dry mouth cavities may take as little as eight weeks to cause you a problem.  This is why dental check ups every two months are necessary as long as you have a dry mouth. Radiation caries typically, but not always, start at your gum line where it is hard to see the cavity.  It is therefore also hard to fill these cavities adequately.  This high rate of cavities happens because your mouth no longer has saliva and therefore the acid made by the germs starts the decay process.  Whenever you eat anything the germs in your mouth change the food into acid.  The acid then burns a small hole in your tooth.  This small hole is the beginning of a cavity.  If this is not treated then it will grow bigger and become a cavity.  The way to avoid this hole getting bigger is to use fluoride every evening as prescribed by your dentist.  You have to make sure that your teeth are very clean before you use the fluoride.  This fluoride in   turn will strengthen your teeth and prepare them for another day of fighting acid.  If you develop radiation caries many times the damage is so large that you will have to have all your teeth removed.  This could be a big problem if some of these teeth are in the field of radiation.  Further details of why this could be a big problem will follow.  (See Osteoradionecrosis).  Loss of taste (dysgeusia) This happens to varying degrees once you've had radiation therapy to your jaw region.  Many times taste is not completely lost but becomes limited.  The loss of taste is mostly due to radiation affecting your taste buds.  However if you have no saliva in  your mouth to carry the flavor to your taste buds it would be difficult for your taste buds to taste anything.  That is why using water or a prescription for Salagen prior to meals and during meals may help with some of the taste.  Keep in mind that taste generally returns very slowly over the course of several months or several years after radiation therapy.  Don't give up hope.  Trismus According to your Radiation Oncologist your TMJ or jaw joints are going to be partially or fully in the field of radiation.  This means that over time the muscles that help you open and close your mouth may get stiff.  This will potentially result in your not being able to open your mouth wide enough or as wide as you can open it now.  Le me give you an example of how slowly this happens and how unaware people are of it.  A gentlemen that had radiation therapy two years ago came back to me complaining that bananas are just too large for him to be able to fit them in between his teeth.  He was not able to open wide enough to bite into a banana.  This happens slowly and over a period of time.  What do we do to try and prevent this?  Your dentist will probably give you a stack of sticks called a trismus exercise device .  This stack will help your remind your muscles and your jaw joint to open up to the same distance every day.  Use these sticks every morning when you wake up according to the instructions given by the dentist.   You must use these sticks for at least one to two years after radiation therapy.  The reason for that is because it happens so slowly and keeps going on for about two years after radiation therapy.  Your hospital dentist will help you monitor your mouth opening and make sure that it's not getting smaller.  Osteoradionecrosis (ORN) This is a condition where your jaw bone after having had radiation therapy becomes very dry.  It has very little blood supply to keep it alive.  If you develop a cavity that  turns into an abscess or an infection then the jaw bone does not have enough blood supply to help fight the infection.  At this point it is very likely that the infection could cause the death of your jaw bone.  When you have dead bone it has to be removed.  Therefore you might end up having to have surgery to remove part of your jaw bone, the part of the jaw bone that has been affected.   Healing is also a problem if you are to have surgery in the areas where the   bone has had radiation therapy.  The same reasons apply.  If you have surgery you need more blood supply which is not available.  When blood supply and oxygen are not available again, there is a chance for the bone to die.  Occasionally ORN happens on its own with no obvious reason.  This is quite rare.  We believe that patients who continue to smoke and/or drink alcohol have a higher chance of having this bone problem.  Therefore once your jaw bone has had radiation therapy if there are any teeth in that area, you should never have them pulled.  You should also never have any surgery on your teeth or gums in that area unless the oral surgeon or Periodontist is aware of your history of radiation. There is some expensive management techniques that might be used to limit your risks.  The risks for ORN either from infection or spontaneous ( or on it's own) are life long.    TRISMUS  Trismus is a condition where the jaw does not allow the mouth to open as wide as it usually does.  This can happen almost suddenly, or in other cases the process is so slow, it is hard to notice it-until it is too far along.  When the jaw joints and/or muscles have been exposed to radiation treatments, the onset of Trismus is very slow.  This is because the muscles are losing their stretching ability over a long period of time, as long as 2 YEARS after the end of radiation.  It is therefore important to exercise these muscles and joints.  TRISMUS EXERCISES   Stack of  tongue depressors measuring the same or a little less than the last documented MIO (Maximum Interincisal Opening).  Secure them with a rubber band on both ends.  Place the stack in the patient's mouth, supporting the other end.  Allow 30 seconds for muscle stretching.  Rest for a few seconds.  Repeat 3-5 times  For all radiation patients, this exercise is recommended in the mornings and evenings unless otherwise instructed.  The exercise should be done for a period of 2 YEARS after the end of radiation.  MIO should be checked routinely on recall dental visits by the general dentist or the hospital dentist.  The patient is advised to report any changes, soreness, or difficulties encountered when doing the exercises.  

## 2019-08-03 ENCOUNTER — Inpatient Hospital Stay (HOSPITAL_COMMUNITY): Admission: RE | Admit: 2019-08-03 | Payer: Medicare Other | Source: Ambulatory Visit

## 2019-08-03 NOTE — Progress Notes (Signed)
Oncology Nurse Navigator Documentation  Joined [name] [name] during Teleconsult with Dr. Isidore Moos to discuss adjuvant RT s/p 06/12/2019 L parotidectomy.  His wife spoke on his behalf. She reported he had L neck skin graft last Friday, is scheduled for post-op follow-up with Dr. Nicolette Bang this Friday. She voiced understanding of:  Plan for ca. 6 weeks RT to tumor base and L neck LN.  Dental extractions not necessary but recommended to keep tomorrow morning's Dental appt for fabrication of SPD.  CT SIM appt will be scheduled when SPD available and Dr. Silvio Clayman releases him for RT planning.  RT will start when Dr. Nicolette Bang indicates graft is adequately healed. I encouraged her to call me with questions/concerns moving forward.  She agreed to do so.  Gayleen Orem, RN, BSN Head & Neck Oncology Nurse [facility] [phone]

## 2019-08-04 ENCOUNTER — Other Ambulatory Visit (HOSPITAL_COMMUNITY)
Admission: RE | Admit: 2019-08-04 | Discharge: 2019-08-04 | Disposition: A | Discharge status: Home / Self Care | Payer: Medicare Other | Source: Ambulatory Visit | Attending: Urology | Admitting: Urology

## 2019-08-04 DIAGNOSIS — Z20822 Contact with and (suspected) exposure to covid-19: Secondary | ICD-10-CM | POA: Diagnosis not present

## 2019-08-04 DIAGNOSIS — Z01812 Encounter for preprocedural laboratory examination: Secondary | ICD-10-CM | POA: Diagnosis not present

## 2019-08-04 DIAGNOSIS — Z08 Encounter for follow-up examination after completed treatment for malignant neoplasm: Secondary | ICD-10-CM | POA: Diagnosis not present

## 2019-08-04 DIAGNOSIS — C07 Malignant neoplasm of parotid gland: Secondary | ICD-10-CM | POA: Diagnosis not present

## 2019-08-04 LAB — SARS CORONAVIRUS 2 (TAT 6-24 HRS): SARS Coronavirus 2: NEGATIVE

## 2019-08-04 NOTE — H&P (Signed)
Office Visit Report     07/18/2019   --------------------------------------------------------------------------------   [name] [name]  [id number]  DOB: 04-19-1941, 79 year old Male  SSN: [id number]   PRIMARY CARE:  W Gareth Eagle, MD  REFERRING:  Arleta Creek, MD  PROVIDER:  Raynelle Bring, M.D.  LOCATION:  [facility] [phone]     --------------------------------------------------------------------------------   CC/HPI: 1. Urothelial carcinoma the bladder  2. Right ureteral obstruction   He returns today for further evaluation. He last underwent cystoscopic evaluation on November 23rd in the operating room. At that time, he also underwent right ureteral stent change. His stent was noted to be moderately encrusted. Incidentally, he was noted to have a lesion behind his left ear that appear to be a draining lesion concerning for possible malignancy. This has since been evaluated in he was diagnosed with a squamous cell carcinoma the skin that was invasive with lymph node involvement. He underwent extensive radical head and neck surgery and apparently is going to require a skin graft and subsequent radiation therapy in the near future. Overall, he continues to have severe dementia. His wife states that his appetite has significantly decreased recently and he has been sleeping much more. He did have a CT scan of the chest, abdomen, and pelvis for follow-up of his bladder cancer per Dr. Alen Blew. He is scheduled to see Dr. Alen Blew tomorrow. He has not noted any hematuria recently.     ALLERGIES: Aricept Nitrofurantoin Monohyd Macro CAPS Sulfa Drugs Tetracyclines    MEDICATIONS: Metoprolol Succinate 25 mg tablet, extended release 24 hr  Avodart 0.5 mg capsule 0 Oral  Fish Oil CAPS Oral  Glipizide  Hydrochlorothiazide 25 mg tablet Oral  MetFORMIN HCl - 500 MG Oral Tablet Oral  Namenda Xr 28 mg capsule sprinkle, extended release 24 hr Oral  Onglyza 5 mg tablet  Oral  Perindopril Erbumine 4 MG Oral Tablet Oral  Trospium Chloride 20 mg tablet 1 tablet PO BID  Vitamin B-12 100 mcg tablet Oral     GU PSH: Bladder Instill AntiCA Agent - 2016 Catheterize For Residual - 03/23/2018 Cysto Bladder Ureth Biopsy - 2018 Cysto Uretero Lithotripsy - 2010 Cystoscopy - 08/10/2018, 03/23/2018, 09/24/2017, 05/21/2017, 02/19/2017, 2018 Cystoscopy Fulguration - 2010 Cystoscopy Insert Stent, Right - 05/08/2019, Right - 01/26/2019, Right - 10/17/2018, Right - 08/29/2018, 2015, 2010, 2010, 2008 Cystoscopy TURBT <2 cm - 08/29/2018, 11/11/2017, 2018, 2017, 2015, 2013 Cystoscopy TURBT 2-5 cm - 10/17/2018, 2017, 2016, 2016 Cystoscopy Ureteroscopy, Right - 10/17/2018, Right - 08/29/2018, Left - 2018, 2015 Locm 300-399Mg /Ml Iodine,1Ml - 09/21/2018 Percut Stone Removal >2cm - 2015 Ureteroscopic stone removal - 2008       PSH Notes: Cystoscopy With Fulguration Medium Lesion (2-5cm), Cystoscopy With Fulguration Medium Lesion (2-5cm), Bladder Injection Of Cancer Treatment, Percutaneous Lithotomy For Stone Over 2cm., Cystoscopy With Insertion Of Ureteral Stent Left, Cystoscopy With Ureteroscopy Left, Cystoscopy With Fulguration Small Lesion (5-68mm), Cystoscopy With Fulguration Small Lesion (5-68mm), Cystoscopy With Pyeloscopy With Lithotripsy, Cystoscopy With Insertion Of Ureteral Stent Right, Cystoscopy With Fulguration, Cystoscopy With Insertion Of Ureteral Stent Right, Cystoscopy With Ureteroscopy With Removal Of Calculus, Cystoscopy With Insertion Of Ureteral Stent Left   NON-GU PSH: None   GU PMH: Ureteral obstruction - 09/20/2018 Urinary Frequency - 09/20/2018 Bladder Cancer overlapping sites - 2017, Malignant neoplasm of overlapping sites of bladder, - 2017 Urinary Urgency, Urinary urgency - 2016 BPH w/LUTS, Benign prostatic hyperplasia (BPH) with straining on urination - 2016 Renal calculus, Nephrolithiasis - 2016  PMH Notes:   [name] [name] is a patient previously followed by Dr.  Tresa Endo. 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 He was noted to have a recurrence in 2013 and underwent TURBT in July 2013. He was also incidentally noted to have a stricture of the distal left ureter on RPG studies at that time and a brush biopsy was performed. He was found to have high grade Ta urothelial carcinoma in February 2016.    Oct 2007: TURBT, Low grade Ta  Jul 2013: TURBT (right bladder neck 1.5 cm tumor, low grade Ta),brush biopsy of distal left ureteral stricture (no malignancy)  Mar 2015: TURBT - Low grade Ta  Feb 2016: TURBT - High grade Ta, [facility]  Mar-Apr 2016: Induction BCG  Jul 2016: Maintenance BCG  Oct 2016: Multiple recurrent tumors noted on surveillance cystoscopy  Dec 2016: TUR - Multiple tumors over right posterior bladder - High grade, Ta  Dec 2016 - Feb 2017: 6 week induction BCG  May 2017: TURBT - High grade, Ta urothelial carcinoma (two tumors)  Oct 2017: Completed induction 6 week course of intravesical gemcitabine [facility] - Dr. Ricky Ala)  Apr 2018: Two small recurrences (each < 1 cm)  Apr 2018: TURBT- High grade Ta, concern for possible lamina propria invasion for tumor at dome  May 2018: Repeat TURBT - No residual malignancy  Jul-Aug 2018: Repeat induction 6 week course of intravesical gemcitabine [facility]- Dr. Ricky Ala)  Sep 2018 - Apr 2019: Monthly maintenance gemcitabine  May 2019: Blue light cystoscopy with TURBT for 2 recurrent tumors posteriorly - High grade, Ta  Jun-Jul 2019: Repeat induction intravesical gemcitabine  Mar 2020: TURBT for two recurrent tumors - High grade, muscle invasive urothelial carcinoma with invasion into distal right ureter, right ureteral stent  Apr 2020: Patient and wife elected trimodality therapy (refused cystectomy due to severe dementia), repeat completion TURBT and right ureteral stent  change  May-Jun 2020: XRT and carboplatin    2) Nephrolithiasis: He has a long standing history of calcium oxalate stones. He was incidentally noted to have a large partial staghorn calculus during retrograde pyelography in February 2015.  Apr 2015: L PCNL   3) Prostate cancer screening: We discontinued screening in 2014 due to his low risk for prostate cancer at that time and progressive dementia.  Last PSA: 0.35 (June 2014)   4) BPH/LUTS: He is treated with Avodart.   5) Right ureteral obstruction: This was noted in March 2020 when he developed hydronephrosis due to progression of bladder cancer and development of muscle invasive disease. He began management with right ureteral stent in March 2020:   Mar 2020: R ureteral stent placement (encrusted within 6 weeks  Apr 2020: R ureteral stent change (6 x 26 [employer])  Aug 2020: R ureteral stent change (6 x 26 [employer])  Nov 2020: R ureteral stent change (6 x 26 [employer]ima)   NON-GU PMH: Bacteriuria (Stable) - 12/14/2018, (Stable), - 09/20/2018 (Stable), - 09/02/2018 (Stable), - 02/19/2017 (Stable), - 2018, - 2018 Diabetes Type 2 Hypertension Unspecified dementia without behavioral disturbance    FAMILY HISTORY: No pertinent family history - Other Prostate Cancer - No Family History   SOCIAL HISTORY: Marital Status: Married Preferred Language: English; Ethnicity: Not Hispanic Or Latino; Race: White Current Smoking Status: Patient has never smoked.  Has never  drank.  Drinks 2 caffeinated drinks per day. Has not had a blood transfusion.     Notes: Never A Smoker   REVIEW OF SYSTEMS:    GU Review Male:   Patient denies frequent urination, hard to postpone urination, burning/ pain with urination, get up at night to urinate, leakage of urine, stream starts and stops, trouble starting your streams, and have to strain to urinate .  Gastrointestinal (Lower):   Patient denies diarrhea and constipation.  Gastrointestinal  (Upper):   Patient denies nausea and vomiting.  Constitutional:   Patient denies fever, night sweats, weight loss, and fatigue.  Skin:   Patient denies skin rash/ lesion and itching.  Eyes:   Patient denies blurred vision and double vision.  Ears/ Nose/ Throat:   Patient denies sore throat and sinus problems.  Hematologic/Lymphatic:   Patient denies swollen glands and easy bruising.  Cardiovascular:   Patient denies leg swelling and chest pains.  Respiratory:   Patient denies cough and shortness of breath.  Endocrine:   Patient denies excessive thirst.  Musculoskeletal:   Patient denies back pain and joint pain.  Neurological:   Patient denies headaches and dizziness.  Psychologic:   Patient denies anxiety and depression.   Notes: A    VITAL SIGNS:      07/18/2019 10:28 AM  Weight 156 lb / 70.76 kg  Height 69 in / 175.26 cm  BP 111/73 mmHg  Pulse 106 /min  Temperature 97.8 F / 36.5 C  BMI 23.0 kg/m   MULTI-SYSTEM PHYSICAL EXAMINATION:    Constitutional: Well-nourished. No physical deformities. Normally developed. Good grooming.  Respiratory: No labored breathing, no use of accessory muscles. Clear bilaterally.  Cardiovascular: Normal temperature, normal extremity pulses, no swelling, no varicosities. Regular rate and rhythm.     PAST DATA REVIEWED:  Source Of History:  Patient  Records Review:   Previous Patient Records  Urine Test Review:   Urinalysis  X-Ray Review: C.T. Chest/ Abd/Pelvis: Reviewed Films.     11/23/12 11/18/11 11/28/10 01/08/10 12/20/03  PSA  Total PSA 0.35  0.42  0.46  0.49  2.42    Notes:                     CLINICAL DATA: Bladder cancer staging, status post chemotherapy and  radiation therapy     EXAM:  CT CHEST, ABDOMEN, AND PELVIS WITH CONTRAST     TECHNIQUE:  Multidetector CT imaging of the chest, abdomen and pelvis was  performed following the standard protocol during bolus  administration of intravenous contrast.     CONTRAST: 157mL  OMNIPAQUE IOHEXOL 300 MG/ML SOLN, additional oral  enteric contrast     COMPARISON: CT abdomen pelvis, 01/06/2019, 09/21/2018     FINDINGS:  CT CHEST FINDINGS     Cardiovascular: Scattered aortic atherosclerosis. Normal heart size.  No pericardial effusion.     Mediastinum/Nodes: No enlarged mediastinal, hilar, or axillary lymph  nodes. Thyroid gland, trachea, and esophagus demonstrate no  significant findings.     Lungs/Pleura: Redemonstrated scarring and volume loss of the left  lower lobe with elevation of the left hemidiaphragm. Unchanged fine  centrilobular nodularity throughout the lungs, most conspicuous in  the upper lobes. No pleural effusion or pneumothorax.     Musculoskeletal: No chest wall mass or suspicious bone lesions  identified.     CT ABDOMEN PELVIS FINDINGS     Hepatobiliary: No solid liver abnormality is seen. No gallstones,  gallbladder wall thickening, or biliary  dilatation.     Pancreas: Unremarkable. No pancreatic ductal dilatation or  surrounding inflammatory changes.     Spleen: Normal in size without significant abnormality.     Adrenals/Urinary Tract: Adrenal glands are unremarkable. Right  double-J ureteral stent remains in position with formed pigtails in  the right renal pelvis and urinary bladder. Mild, persistent right  hydronephrosis and mild periureteral stranding. Unchanged mild,  right eccentric thickening of the posterior urinary bladder (series  2, image 117).     Stomach/Bowel: Stomach is within normal limits. No evidence of bowel  wall thickening, distention, or inflammatory changes.     Vascular/Lymphatic: Aortic atherosclerosis. No enlarged abdominal or  pelvic lymph nodes.     Reproductive: No mass or other abnormality.     Other: No abdominal wall hernia or abnormality. No abdominopelvic  ascites.     Musculoskeletal: No acute or significant osseous findings.     IMPRESSION:  1. Unchanged post treatment appearance of  the urinary bladder with  mild, right eccentric thickening. No discrete mass noted.  2. Right double-J ureteral stent remains in position with formed  pigtails in the right renal pelvis and urinary bladder. Mild,  persistent right hydronephrosis and mild periureteral stranding.  3. No evidence of lymphadenopathy or distant metastatic disease  within the chest, abdomen or pelvis.  4. Unchanged fine centrilobular nodularity throughout the lungs,  most conspicuous in the upper lobes. This is consistent with  smoking-related respiratory bronchiolitis or other nonspecific  atypical infection or inflammatory inhalational process. Findings  are generally not consistent with metastatic disease.  5. Cholelithiasis.  6. Aortic Atherosclerosis (ICD10-I70.0).        Electronically Signed  By: Eddie Candle M.D.  On: 07/13/2019 13:12   PROCEDURES:          UTI+ - [id number], [id number], [id number], [id number], [id number], [id number], [phone] UTI Plus has been ordered to detect urinary organisms by PCR.          Urinalysis w/Scope Dipstick Dipstick Cont'd Micro  Color: Yellow Bilirubin: Neg mg/dL WBC/hpf: Packed/hpf  Appearance: Cloudy Ketones: Neg mg/dL RBC/hpf: 20 - 40/hpf  Specific Gravity: 1.025 Blood: 2+ ery/uL Bacteria: Many (>50/hpf)  pH: 6.0 Protein: 3+ mg/dL Cystals: NS (Not Seen)  Glucose: Neg mg/dL Urobilinogen: 0.2 mg/dL Casts: NS (Not Seen)    Nitrites: Neg Trichomonas: Not Present    Leukocyte Esterase: 3+ leu/uL Mucous: Not Present      Epithelial Cells: 0 - 5/hpf      Yeast: Many (>10/hpf)      Sperm: Not Present    ASSESSMENT:      ICD-10 Details  1 GU:   Ureteral obstruction - N13.1   3   Urinary Urgency - R39.15   2 NON-GU:   Bacteriuria - R82.71    PLAN:            Medications New Meds: Cipro 500 mg tablet 1 tablet PO BID Begin 3 days prior to stent change surgery.  #6  0 Refill(s)            Schedule Return Visit/Planned Activity: Other See Visit Notes             Note: Will call to  schedule surgery.          Document Letter(s):  Created for Patient: Clinical Summary         Notes:   1. Urothelial carcinoma the bladder: We reviewed his CT scan that does not demonstrate any evidence of metastatic disease. His bladder  will be next evaluated at his upcoming cystoscopic procedure. No evidence for obvious disease recurrence.   2. Right ureteral obstruction: He will have a UTI plus test performed today to further speciated his urine with plans to treat him prior to his next stent change. We will tentatively schedule this for later in February although his wife knows we can alter this depending on his needs for further treatment of his squamous cell cancer. I do not want to significantly delay his ureteral stent change though considering his history of quick stent incrustation.   CC: Dr. Zola Button     * Signed by Raynelle Bring, M.D. on 07/18/19 at 6:05 PM (EST)*

## 2019-08-07 ENCOUNTER — Other Ambulatory Visit: Payer: Self-pay

## 2019-08-07 ENCOUNTER — Ambulatory Visit (HOSPITAL_COMMUNITY)
Admission: RE | Admit: 2019-08-07 | Discharge: 2019-08-07 | Disposition: A | Discharge status: Home / Self Care | Payer: Medicare Other | Attending: Urology | Admitting: Urology

## 2019-08-07 ENCOUNTER — Encounter (HOSPITAL_COMMUNITY): Payer: Self-pay | Admitting: Urology

## 2019-08-07 ENCOUNTER — Ambulatory Visit (HOSPITAL_COMMUNITY): Payer: Medicare Other

## 2019-08-07 ENCOUNTER — Telehealth: Payer: Self-pay | Admitting: Radiation Oncology

## 2019-08-07 ENCOUNTER — Ambulatory Visit (HOSPITAL_COMMUNITY): Payer: Medicare Other | Admitting: Certified Registered Nurse Anesthetist

## 2019-08-07 ENCOUNTER — Encounter (HOSPITAL_COMMUNITY)
Admission: RE | Disposition: A | Discharge status: Home / Self Care | Payer: Self-pay | Source: Home / Self Care | Attending: Urology

## 2019-08-07 ENCOUNTER — Ambulatory Visit (HOSPITAL_COMMUNITY): Payer: Medicare Other | Admitting: Physician Assistant

## 2019-08-07 DIAGNOSIS — Z881 Allergy status to other antibiotic agents status: Secondary | ICD-10-CM | POA: Diagnosis not present

## 2019-08-07 DIAGNOSIS — N135 Crossing vessel and stricture of ureter without hydronephrosis: Secondary | ICD-10-CM | POA: Diagnosis not present

## 2019-08-07 DIAGNOSIS — Z79899 Other long term (current) drug therapy: Secondary | ICD-10-CM | POA: Insufficient documentation

## 2019-08-07 DIAGNOSIS — C679 Malignant neoplasm of bladder, unspecified: Secondary | ICD-10-CM | POA: Insufficient documentation

## 2019-08-07 DIAGNOSIS — E119 Type 2 diabetes mellitus without complications: Secondary | ICD-10-CM | POA: Insufficient documentation

## 2019-08-07 DIAGNOSIS — F0390 Unspecified dementia without behavioral disturbance: Secondary | ICD-10-CM | POA: Diagnosis not present

## 2019-08-07 DIAGNOSIS — Z7984 Long term (current) use of oral hypoglycemic drugs: Secondary | ICD-10-CM | POA: Insufficient documentation

## 2019-08-07 DIAGNOSIS — I10 Essential (primary) hypertension: Secondary | ICD-10-CM | POA: Insufficient documentation

## 2019-08-07 DIAGNOSIS — N131 Hydronephrosis with ureteral stricture, not elsewhere classified: Secondary | ICD-10-CM | POA: Diagnosis not present

## 2019-08-07 DIAGNOSIS — R6520 Severe sepsis without septic shock: Secondary | ICD-10-CM | POA: Diagnosis not present

## 2019-08-07 DIAGNOSIS — C672 Malignant neoplasm of lateral wall of bladder: Secondary | ICD-10-CM | POA: Diagnosis not present

## 2019-08-07 DIAGNOSIS — N400 Enlarged prostate without lower urinary tract symptoms: Secondary | ICD-10-CM | POA: Diagnosis not present

## 2019-08-07 DIAGNOSIS — Z882 Allergy status to sulfonamides status: Secondary | ICD-10-CM | POA: Insufficient documentation

## 2019-08-07 DIAGNOSIS — A419 Sepsis, unspecified organism: Secondary | ICD-10-CM | POA: Diagnosis not present

## 2019-08-07 HISTORY — PX: CYSTOSCOPY WITH STENT PLACEMENT: SHX5790

## 2019-08-07 LAB — GLUCOSE, CAPILLARY
Glucose-Capillary: 140 mg/dL — ABNORMAL HIGH (ref 70–99)
Glucose-Capillary: 139 mg/dL — ABNORMAL HIGH (ref 70–99)

## 2019-08-07 SURGERY — CYSTOSCOPY, WITH STENT INSERTION
Anesthesia: General | Laterality: Right

## 2019-08-07 MED ORDER — PROPOFOL 10 MG/ML IV BOLUS
INTRAVENOUS | Status: DC | PRN
  Administered 2019-08-07: 140 mg via INTRAVENOUS

## 2019-08-07 MED ORDER — FENTANYL CITRATE (PF) 100 MCG/2ML IJ SOLN
INTRAMUSCULAR | Status: AC
  Filled 2019-08-07: qty 2

## 2019-08-07 MED ORDER — LIDOCAINE 2% (20 MG/ML) 5 ML SYRINGE
INTRAMUSCULAR | Status: DC | PRN
  Administered 2019-08-07: 60 mg via INTRAVENOUS

## 2019-08-07 MED ORDER — LACTATED RINGERS IV SOLN: INTRAVENOUS | Status: DC

## 2019-08-07 MED ORDER — STERILE WATER FOR IRRIGATION IR SOLN
Status: DC | PRN
  Administered 2019-08-07: 1000 mL

## 2019-08-07 MED ORDER — CIPROFLOXACIN IN D5W 400 MG/200ML IV SOLN
400.0000 mg | Freq: Once | INTRAVENOUS | Status: AC
  Administered 2019-08-07: 12:00:00 400 mg via INTRAVENOUS
  Filled 2019-08-07: qty 200

## 2019-08-07 MED ORDER — PROPOFOL 10 MG/ML IV BOLUS
INTRAVENOUS | Status: AC
  Filled 2019-08-07: qty 20

## 2019-08-07 MED ORDER — PHENYLEPHRINE 40 MCG/ML (10ML) SYRINGE FOR IV PUSH (FOR BLOOD PRESSURE SUPPORT)
PREFILLED_SYRINGE | INTRAVENOUS | Status: DC | PRN
  Administered 2019-08-07: 80 ug via INTRAVENOUS

## 2019-08-07 SURGICAL SUPPLY — 12 items
BAG URO CATCHER STRL LF (MISCELLANEOUS) ×2 IMPLANT
CATH INTERMIT  6FR 70CM (CATHETERS) ×2 IMPLANT
CLOTH BEACON ORANGE TIMEOUT ST (SAFETY) ×2 IMPLANT
GLOVE BIOGEL M STRL SZ7.5 (GLOVE) ×2 IMPLANT
GOWN STRL REUS W/TWL LRG LVL3 (GOWN DISPOSABLE) ×4 IMPLANT
GUIDEWIRE STR DUAL SENSOR (WIRE) ×2 IMPLANT
KIT TURNOVER KIT A (KITS) IMPLANT
MANIFOLD NEPTUNE II (INSTRUMENTS) ×2 IMPLANT
PACK CYSTO (CUSTOM PROCEDURE TRAY) ×2 IMPLANT
STENT URO INLAY 6FRX26CM (STENTS) ×2 IMPLANT
TUBING CONNECTING 10 (TUBING) ×2 IMPLANT
TUBING UROLOGY SET (TUBING) IMPLANT

## 2019-08-07 NOTE — Op Note (Signed)
Preoperative diagnosis:  1. Right ureteral obstruction 2. Bladder cancer  Postoperative diagnosis:  1. Right ureteral obstruction  2. Bladder cancer  Procedure:  1. Cystoscopy 2. Right ureteral stent placement (6 x 26 Bard Inlay Optima)  Surgeon: Roxy Horseman, Brooke Bonito. M.D.  Anesthesia: General  Complications: None  Intraoperative findings: His indwelling stent was not encrusted.  EBL: Minimal  Specimens: None  Indication: [name] [name] is a 79 y.o. patient with right ureteral obstruction. After reviewing the management options for treatment, he elected to proceed with the above surgical procedure(s). We have discussed the potential benefits and risks of the procedure, side effects of the proposed treatment, the likelihood of the patient achieving the goals of the procedure, and any potential problems that might occur during the procedure or recuperation. Informed consent has been obtained.  Description of procedure:  The patient was taken to the operating room and general anesthesia was induced.  The patient was placed in the dorsal lithotomy position, prepped and draped in the usual sterile fashion, and preoperative antibiotics were administered. A preoperative time-out was performed.   Cystourethroscopy was performed.  The patient's urethra was examined and was unremarkable. The bladder was then systematically examined in its entirety. There was no evidence for any bladder tumors, stones, or other mucosal pathology.    Attention then turned to the right ureteral orifice and the patient's indwelling ureteral stent was identified and brought out to the urethral meatus with the flexible graspers.  A 0.38 sensor guidewire was then advanced up the right ureter into the renal pelvis under fluoroscopic guidance.  The wire was then backloaded through the cystoscope and a ureteral stent was advance over the wire using Seldinger technique.  The stent was positioned appropriately under  fluoroscopic and cystoscopic guidance.  The wire was then removed with an adequate stent curl noted in the renal pelvis as well as in the bladder.  The bladder was then emptied and the procedure ended.  The patient appeared to tolerate the procedure well and without complications.  The patient was able to be awakened and transferred to the recovery unit in satisfactory condition.    Pryor Curia MD

## 2019-08-07 NOTE — Discharge Instructions (Signed)

## 2019-08-07 NOTE — Transfer of Care (Signed)
Immediate Anesthesia Transfer of Care Note  Patient: [name] [name]  Procedure(s) Performed: CYSTOSCOPY WITH STENT EXCHANGE (Right )  Patient Location: PACU  Anesthesia Type:General  Level of Consciousness: awake, alert  and oriented  Airway & Oxygen Therapy: Patient Spontanous Breathing and Patient connected to face mask oxygen  Post-op Assessment: Report given to RN and Post -op Vital signs reviewed and stable  Post vital signs: Reviewed and stable  Last Vitals:  Vitals Value Taken Time  BP 97/56 08/07/19 1239  Temp 36.9 C 08/07/19 1241  Pulse    Resp 14 08/07/19 1241  SpO2    Vitals shown include unvalidated device data.  Last Pain:  Vitals:   08/07/19 1002  TempSrc:   PainSc: 0-No pain         Complications: No apparent anesthesia complications

## 2019-08-07 NOTE — Anesthesia Procedure Notes (Signed)
Procedure Name: LMA Insertion Date/Time: 08/07/2019 12:08 PM Performed by: [place] Eliazer Hemphill C, CRNA Pre-anesthesia Checklist: Patient identified, Emergency Drugs available, Suction available and Patient being monitored Patient Re-evaluated:Patient Re-evaluated prior to induction Oxygen Delivery Method: Circle system utilized Preoxygenation: Pre-oxygenation with 100% oxygen Induction Type: IV induction Ventilation: Mask ventilation without difficulty LMA: LMA inserted LMA Size: 4.0 Number of attempts: 1 Airway Equipment and Method: Bite block Placement Confirmation: positive ETCO2 Tube secured with: Tape Dental Injury: Teeth and Oropharynx as per pre-operative assessment

## 2019-08-07 NOTE — Interval H&P Note (Signed)
History and Physical Interval Note:  08/07/2019 11:54 AM  [name] [name]  has presented today for surgery, with the diagnosis of RIGHT URETERAL OBSTRUCTION, BLADDER CANCER.  The various methods of treatment have been discussed with the patient and family. After consideration of risks, benefits and other options for treatment, the patient has consented to  Procedure(s): CYSTOSCOPY WITH STENT EXCHANGE (Right) as a surgical intervention.  The patient's history has been reviewed, patient examined, no change in status, stable for surgery.  I have reviewed the patient's chart and labs.  Questions were answered to the patient's satisfaction.     [employer]

## 2019-08-07 NOTE — Telephone Encounter (Signed)
Scheduled appt per 2/17 sch message - unable to reach pt - left message with appt date and time

## 2019-08-07 NOTE — Anesthesia Postprocedure Evaluation (Signed)
Anesthesia Post Note  Patient: [name] [name]  Procedure(s) Performed: CYSTOSCOPY WITH STENT EXCHANGE (Right )     Patient location during evaluation: PACU Anesthesia Type: Spinal Level of consciousness: awake Pain management: pain level controlled Vital Signs Assessment: post-procedure vital signs reviewed and stable Respiratory status: spontaneous breathing Cardiovascular status: stable Postop Assessment: no apparent nausea or vomiting Anesthetic complications: no    Last Vitals:  Vitals:   08/07/19 1330 08/07/19 1426  BP:  (!) 155/88  Pulse: 69 73  Resp: 12 16  Temp: (!) 36.3 C 36.5 C  SpO2: 100% 100%    Last Pain:  Vitals:   08/07/19 1426  TempSrc:   PainSc: 0-No pain                 Dondra Rhett

## 2019-08-07 NOTE — Anesthesia Preprocedure Evaluation (Addendum)
Anesthesia Evaluation  Patient identified by MRN, date of birth, ID band Patient awake    Reviewed: Allergy & Precautions, NPO status , Patient's Chart, lab work & pertinent test results  History of Anesthesia Complications (+) MALIGNANT HYPERTHERMIA  Airway Mallampati: II  TM Distance: >3 FB     Dental   Pulmonary    breath sounds clear to auscultation       Cardiovascular hypertension,  Rhythm:Regular Rate:Normal     Neuro/Psych PSYCHIATRIC DISORDERS Dementia  Neuromuscular disease    GI/Hepatic negative GI ROS, Neg liver ROS,   Endo/Other  diabetes  Renal/GU Renal disease     Musculoskeletal   Abdominal   Peds  Hematology   Anesthesia Other Findings   Reproductive/Obstetrics                           Anesthesia Physical Anesthesia Plan  ASA: III  Anesthesia Plan: General   Post-op Pain Management:    Induction: Intravenous  PONV Risk Score and Plan: 2 and Ondansetron and Midazolam  Airway Management Planned: LMA  Additional Equipment:   Intra-op Plan:   Post-operative Plan: Extubation in OR  Informed Consent: I have reviewed the patients History and Physical, chart, labs and discussed the procedure including the risks, benefits and alternatives for the proposed anesthesia with the patient or authorized representative who has indicated his/her understanding and acceptance.     Dental advisory given  Plan Discussed with: CRNA and Anesthesiologist  Anesthesia Plan Comments:         Anesthesia Quick Evaluation

## 2019-08-08 ENCOUNTER — Telehealth (HOSPITAL_COMMUNITY): Payer: Self-pay | Admitting: Dentistry

## 2019-08-08 ENCOUNTER — Telehealth: Payer: Self-pay | Admitting: *Deleted

## 2019-08-08 NOTE — Telephone Encounter (Signed)
08/08/2019  Patient:            [name] [name] Date of Birth:  28-Sep-1940 MRN:                [id number]   I called and LMOM to have patient's wife call Dental Medicine to schedule an appointment to insert Scatter guards for patient prior to anticipated simulation appointment with Dr. Isidore Moos in the near future.   Lenn Cal, DDS

## 2019-08-08 NOTE — Telephone Encounter (Signed)
RETURNED PATIENT'S PHONE CALL AND RESCHEDULED NUTRITION TELEPHONE CALL PER PATIENT REQUEST, APPT. RESCHEDULED FOR 08-14-19 @ 9 AM, PATIENT AGREED TO NEW TIME AND DATE

## 2019-08-09 ENCOUNTER — Other Ambulatory Visit (HOSPITAL_COMMUNITY): Payer: Federal, State, Local not specified - PPO | Admitting: Dentistry

## 2019-08-09 ENCOUNTER — Telehealth: Payer: Self-pay | Admitting: *Deleted

## 2019-08-09 ENCOUNTER — Encounter: Payer: Federal, State, Local not specified - PPO | Admitting: Nutrition

## 2019-08-09 NOTE — Telephone Encounter (Signed)
Oncology Nurse Navigator Documentation  Spoke with [name] [name] wife to check on his well-being since last Friday's surgical follow-up with Dr. Nicolette Bang. She reported graft is healing well per that visit, next follow-up is scheduled for next Tuesday 3:00. I explained I will schedule CT SIM for next Wednesday pending appt with Dental Medicine for final fitting of SPD. She acknowledged message from Dental Medicine re scheduling appt, I encouraged her to call to coordinate appt prior to CT SIM.  I later called her with 3/3 3:00 CT SIM appt, encouraged [place] 2:30 arrival to facilitate registration and arrival to Radiation Waiting by 2:45.  She voiced understanding.    Dr. Isidore Moos provided update.  [name] Orem, RN, BSN Head & Neck Oncology Nurse [place] at [place] [phone]

## 2019-08-14 ENCOUNTER — Ambulatory Visit (HOSPITAL_COMMUNITY): Payer: Medicare Other | Admitting: Dentistry

## 2019-08-14 ENCOUNTER — Encounter (HOSPITAL_COMMUNITY): Payer: Self-pay | Admitting: Dentistry

## 2019-08-14 ENCOUNTER — Telehealth: Payer: Self-pay | Admitting: *Deleted

## 2019-08-14 ENCOUNTER — Ambulatory Visit: Payer: Medicare Other | Admitting: Nutrition

## 2019-08-14 ENCOUNTER — Other Ambulatory Visit: Payer: Self-pay

## 2019-08-14 VITALS — BP 109/36 | HR 74 | Temp 98.6°F

## 2019-08-14 DIAGNOSIS — Z01818 Encounter for other preprocedural examination: Secondary | ICD-10-CM | POA: Diagnosis not present

## 2019-08-14 DIAGNOSIS — Z463 Encounter for fitting and adjustment of dental prosthetic device: Secondary | ICD-10-CM | POA: Diagnosis not present

## 2019-08-14 DIAGNOSIS — C07 Malignant neoplasm of parotid gland: Secondary | ICD-10-CM | POA: Diagnosis not present

## 2019-08-14 NOTE — Telephone Encounter (Signed)
CALLED PATIENT TO INFORM OF LAB FOR 08-16-19 @ 2:30 PM, SPOKE WITH PATIENT'S WIFE- JULIA AND SHE IS AWARE OF THIS APPT.

## 2019-08-14 NOTE — Progress Notes (Signed)
08/14/2019  Patient Name:   [name] [name] Date of Birth:   07-13-1940 Medical Record Number: [id number]  BP (!) 109/36 (BP Location: Left Arm)   Pulse 74   Temp 98.6 F (37 C)   Ernesto Rutherford now presents for insertion of upper and lower scatter protection devices.  PROCEDURE: Appliances were tried in and adjusted as needed. [place] Trismus device was previously fabricated at 30 mm using 17sticks. Postop instructions were provided and a written and verbal format concerning the use and care of appliances. All questions were answered. Patient or wife to call to schedule a follow-up dental appointment as needed during the radiation therapy.  Otherwise, patient will be seen approximately 1 month after the radiation therapy has been completed.  Patient or wife to call if questions or problems arise before then.   Lenn Cal, DDS

## 2019-08-14 NOTE — Patient Instructions (Signed)
COVID-19 Education: The signs and symptoms of COVID-19 were discussed with the patient and how to seek care for testing (follow up with PCP or arrange E-visit).   The importance of social distancing was discussed today.   Patient and wife to call if they have any questions or problems during radiation therapy. Patient will be seen approximately 1 month after the last radiation therapy has been provided.  Lenn Cal, DDS

## 2019-08-14 NOTE — Progress Notes (Signed)
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 I spoke to patient's wife on the telephone.  She reports patient has severe dementia/Alzheimer's disease.  Other past medical history includes bladder cancer status post right ureteral obstruction, hard of hearing, and diabetes.  Medications include Glucotrol, Glucophage, multivitamin, fish oil, and vitamin B12.  Labs include glucose 139-140 on February 22.  Height: 5 feet 10 inches. Weight: 163 pounds on February 16. Usual body weight: 189 pounds in June 2020. BMI: 23.42.  Patient's appetite has been improving over the last 2 to 3 weeks. Wife reports patient weighs approximately 165 pounds at home. She suspects he still has some trouble chewing and swallowing on one side of his mouth.  She denies patient has vomiting, constipation or ongoing diarrhea. She reports patient does have a "blowout" every now and then.  She attributes this to diabetic medication. Patient does not enjoy oral nutrition supplements however sometimes wife will get him to drink chocolate Ensure.  She is putting vanilla Ensure in his coffee. She has met with a nutritionist in the past.  Nutrition diagnosis: Unintended weight loss related to cancer as evidenced by 14% weight loss over 8 months.  Intervention: Provided support and encouragement for patient to continue small frequent meals and snacks. Encouraged increased protein as tolerated. We will provide samples of Ensure for patient's wife to pick up as well as some coupons. Continue to monitor patient's healing.  Monitoring, evaluation, goals: Patient will tolerate increased calories and protein to promote healing.  Next visit: We will follow as needed after treatment begins.  **Disclaimer: This note was dictated with voice recognition software. Similar sounding words can inadvertently be transcribed and this note may contain transcription errors  which may not have been corrected upon publication of note.**

## 2019-08-15 DIAGNOSIS — C7989 Secondary malignant neoplasm of other specified sites: Secondary | ICD-10-CM | POA: Diagnosis not present

## 2019-08-16 ENCOUNTER — Other Ambulatory Visit: Payer: Self-pay | Admitting: Radiation Oncology

## 2019-08-16 ENCOUNTER — Encounter: Payer: Self-pay | Admitting: *Deleted

## 2019-08-16 ENCOUNTER — Ambulatory Visit
Admission: RE | Admit: 2019-08-16 | Discharge: 2019-08-16 | Disposition: A | Discharge status: Home / Self Care | Payer: Medicare Other | Source: Ambulatory Visit | Attending: Radiation Oncology | Admitting: Radiation Oncology

## 2019-08-16 ENCOUNTER — Telehealth: Payer: Self-pay | Admitting: Nutrition

## 2019-08-16 ENCOUNTER — Other Ambulatory Visit: Payer: Self-pay

## 2019-08-16 DIAGNOSIS — C4440 Unspecified malignant neoplasm of skin of scalp and neck: Secondary | ICD-10-CM | POA: Insufficient documentation

## 2019-08-16 DIAGNOSIS — Z1329 Encounter for screening for other suspected endocrine disorder: Secondary | ICD-10-CM

## 2019-08-16 DIAGNOSIS — C07 Malignant neoplasm of parotid gland: Secondary | ICD-10-CM | POA: Diagnosis not present

## 2019-08-16 DIAGNOSIS — R634 Abnormal weight loss: Secondary | ICD-10-CM

## 2019-08-16 DIAGNOSIS — R5381 Other malaise: Secondary | ICD-10-CM

## 2019-08-16 DIAGNOSIS — Z79899 Other long term (current) drug therapy: Secondary | ICD-10-CM | POA: Diagnosis not present

## 2019-08-16 DIAGNOSIS — Z510 Encounter for antineoplastic radiation therapy: Secondary | ICD-10-CM | POA: Insufficient documentation

## 2019-08-16 NOTE — Telephone Encounter (Signed)
Scheduled appt per 3/3 sch msg. Left voicemail with appt detail.

## 2019-08-16 NOTE — Telephone Encounter (Signed)
Cancelled appt per 3/3 staff msg. Left voicemail of cancellation. Also asked pt's wife to call the office back to set up weekly nutrition appts per Dory [place]

## 2019-08-17 ENCOUNTER — Encounter: Payer: Self-pay | Admitting: *Deleted

## 2019-08-17 LAB — TSH: TSH: 0.326 u[IU]/mL (ref 0.320–4.118)

## 2019-08-17 NOTE — Progress Notes (Signed)
[place] Work  Clinical Social Work was referred by Pension scheme manager for assessment of psychosocial needs.  Clinical Social Worker contacted caregiver by phone  to offer support and assess for needs.  Mrs. Parrill reported they were both "doing very well considering what we've been through".  She shared he recently completed treatment earlier this year and is ready to start treatment again.  She stated no concerns at this time- reported a strong support system including their family and church friends.  CSW encouraged patient's spouse to call with any questions or concerns.   Gwinda Maine, LCSW  Clinical Social Worker [facility]

## 2019-08-21 ENCOUNTER — Encounter: Payer: Federal, State, Local not specified - PPO | Admitting: Nutrition

## 2019-08-22 DIAGNOSIS — Z79899 Other long term (current) drug therapy: Secondary | ICD-10-CM | POA: Diagnosis not present

## 2019-08-22 DIAGNOSIS — C4440 Unspecified malignant neoplasm of skin of scalp and neck: Secondary | ICD-10-CM | POA: Diagnosis not present

## 2019-08-22 DIAGNOSIS — C07 Malignant neoplasm of parotid gland: Secondary | ICD-10-CM | POA: Diagnosis not present

## 2019-08-22 DIAGNOSIS — Z510 Encounter for antineoplastic radiation therapy: Secondary | ICD-10-CM | POA: Diagnosis not present

## 2019-08-22 NOTE — Progress Notes (Signed)
Oncology Nurse Navigator Documentation  To provide support, encouragement and care continuity, met with [name] [name] during his CT SIM. He was accompanied by his wife. He tolerated procedure without difficulty, denied questions/concerns.   I toured them to [facility] 4 treatment area, explained procedures for lobby registration, arrival to Radiation Waiting, arrival to tmt area and preparation for tmt.  They voiced understanding.   I encouraged them to call me prior to 3/11 [place] with any questions.  Gayleen Orem, RN, BSN Head & Neck Oncology Nurse [place] at [place] [phone]

## 2019-08-24 ENCOUNTER — Other Ambulatory Visit: Payer: Self-pay

## 2019-08-24 ENCOUNTER — Ambulatory Visit
Admission: RE | Admit: 2019-08-24 | Discharge: 2019-08-24 | Disposition: A | Discharge status: Home / Self Care | Payer: Medicare Other | Source: Ambulatory Visit | Attending: Radiation Oncology | Admitting: Radiation Oncology

## 2019-08-24 ENCOUNTER — Encounter: Payer: Self-pay | Admitting: *Deleted

## 2019-08-24 DIAGNOSIS — Z79899 Other long term (current) drug therapy: Secondary | ICD-10-CM | POA: Diagnosis not present

## 2019-08-24 DIAGNOSIS — C07 Malignant neoplasm of parotid gland: Secondary | ICD-10-CM | POA: Diagnosis not present

## 2019-08-24 DIAGNOSIS — C4440 Unspecified malignant neoplasm of skin of scalp and neck: Secondary | ICD-10-CM

## 2019-08-24 DIAGNOSIS — Z510 Encounter for antineoplastic radiation therapy: Secondary | ICD-10-CM | POA: Diagnosis not present

## 2019-08-24 MED ORDER — SONAFINE EX EMUL
1.0000 "application " | Freq: Once | CUTANEOUS | Status: AC
  Administered 2019-08-24: 1 via TOPICAL

## 2019-08-24 NOTE — Progress Notes (Signed)

## 2019-08-25 ENCOUNTER — Ambulatory Visit
Admission: RE | Admit: 2019-08-25 | Discharge: 2019-08-25 | Disposition: A | Discharge status: Home / Self Care | Payer: Medicare Other | Source: Ambulatory Visit | Attending: Radiation Oncology | Admitting: Radiation Oncology

## 2019-08-25 ENCOUNTER — Other Ambulatory Visit: Payer: Self-pay

## 2019-08-25 DIAGNOSIS — Z510 Encounter for antineoplastic radiation therapy: Secondary | ICD-10-CM | POA: Diagnosis not present

## 2019-08-25 DIAGNOSIS — C07 Malignant neoplasm of parotid gland: Secondary | ICD-10-CM | POA: Diagnosis not present

## 2019-08-25 DIAGNOSIS — Z79899 Other long term (current) drug therapy: Secondary | ICD-10-CM | POA: Diagnosis not present

## 2019-08-25 DIAGNOSIS — C4440 Unspecified malignant neoplasm of skin of scalp and neck: Secondary | ICD-10-CM | POA: Diagnosis not present

## 2019-08-25 NOTE — Progress Notes (Signed)
Oncology Nurse Navigator Documentation  To provide support, encouragement and care continuity, met with [name] [name] for his initial  RT.  He was accompanied by his wife as he has advanced dementia.  I reviewed the 2-step treatment process, answered questions.   He completed treatment without difficulty, denied questions/concerns.  I reviewed the registration/arrival procedure for subsequent treatments.  I escorted them to Nursing for patient education with RN Anderson Malta.  I encouraged them to call me with questions/concerns as tmts proceed.  Gayleen Orem, RN, BSN Head & Neck Oncology Nurse [place] at [place] [phone]

## 2019-08-28 ENCOUNTER — Other Ambulatory Visit: Payer: Self-pay

## 2019-08-28 ENCOUNTER — Ambulatory Visit
Admission: RE | Admit: 2019-08-28 | Discharge: 2019-08-28 | Disposition: A | Discharge status: Home / Self Care | Payer: Medicare Other | Source: Ambulatory Visit | Attending: Radiation Oncology | Admitting: Radiation Oncology

## 2019-08-28 DIAGNOSIS — C07 Malignant neoplasm of parotid gland: Secondary | ICD-10-CM | POA: Diagnosis not present

## 2019-08-28 DIAGNOSIS — Z510 Encounter for antineoplastic radiation therapy: Secondary | ICD-10-CM | POA: Diagnosis not present

## 2019-08-28 DIAGNOSIS — Z79899 Other long term (current) drug therapy: Secondary | ICD-10-CM | POA: Diagnosis not present

## 2019-08-28 DIAGNOSIS — C4440 Unspecified malignant neoplasm of skin of scalp and neck: Secondary | ICD-10-CM | POA: Diagnosis not present

## 2019-08-29 ENCOUNTER — Other Ambulatory Visit: Payer: Self-pay

## 2019-08-29 ENCOUNTER — Ambulatory Visit
Admission: RE | Admit: 2019-08-29 | Discharge: 2019-08-29 | Disposition: A | Discharge status: Home / Self Care | Payer: Medicare Other | Source: Ambulatory Visit | Attending: Radiation Oncology | Admitting: Radiation Oncology

## 2019-08-29 DIAGNOSIS — Z79899 Other long term (current) drug therapy: Secondary | ICD-10-CM | POA: Diagnosis not present

## 2019-08-29 DIAGNOSIS — Z510 Encounter for antineoplastic radiation therapy: Secondary | ICD-10-CM | POA: Diagnosis not present

## 2019-08-29 DIAGNOSIS — C4440 Unspecified malignant neoplasm of skin of scalp and neck: Secondary | ICD-10-CM | POA: Diagnosis not present

## 2019-08-29 DIAGNOSIS — C07 Malignant neoplasm of parotid gland: Secondary | ICD-10-CM | POA: Diagnosis not present

## 2019-08-30 ENCOUNTER — Ambulatory Visit
Admission: RE | Admit: 2019-08-30 | Discharge: 2019-08-30 | Disposition: A | Discharge status: Home / Self Care | Payer: Medicare Other | Source: Ambulatory Visit | Attending: Radiation Oncology | Admitting: Radiation Oncology

## 2019-08-30 ENCOUNTER — Other Ambulatory Visit: Payer: Self-pay

## 2019-08-30 ENCOUNTER — Telehealth: Payer: Self-pay | Admitting: *Deleted

## 2019-08-30 DIAGNOSIS — Z79899 Other long term (current) drug therapy: Secondary | ICD-10-CM | POA: Diagnosis not present

## 2019-08-30 DIAGNOSIS — C4440 Unspecified malignant neoplasm of skin of scalp and neck: Secondary | ICD-10-CM | POA: Diagnosis not present

## 2019-08-30 DIAGNOSIS — Z510 Encounter for antineoplastic radiation therapy: Secondary | ICD-10-CM | POA: Diagnosis not present

## 2019-08-30 DIAGNOSIS — C07 Malignant neoplasm of parotid gland: Secondary | ICD-10-CM | POA: Diagnosis not present

## 2019-08-30 NOTE — Telephone Encounter (Signed)
CALLED PATIENT TO INFORM OF NUTRITION TELEPHONE FU , SPOKE WITH PATIENT'S WIFE- JULIA AND SHE IS AWARE OF THESE APPTS. AND IS GOOD WITH THEM

## 2019-08-31 ENCOUNTER — Other Ambulatory Visit: Payer: Self-pay

## 2019-08-31 ENCOUNTER — Ambulatory Visit
Admission: RE | Admit: 2019-08-31 | Discharge: 2019-08-31 | Disposition: A | Discharge status: Home / Self Care | Payer: Medicare Other | Source: Ambulatory Visit | Attending: Radiation Oncology | Admitting: Radiation Oncology

## 2019-08-31 DIAGNOSIS — C07 Malignant neoplasm of parotid gland: Secondary | ICD-10-CM | POA: Diagnosis not present

## 2019-08-31 DIAGNOSIS — Z79899 Other long term (current) drug therapy: Secondary | ICD-10-CM | POA: Diagnosis not present

## 2019-08-31 DIAGNOSIS — C4440 Unspecified malignant neoplasm of skin of scalp and neck: Secondary | ICD-10-CM | POA: Diagnosis not present

## 2019-08-31 DIAGNOSIS — Z510 Encounter for antineoplastic radiation therapy: Secondary | ICD-10-CM | POA: Diagnosis not present

## 2019-09-01 ENCOUNTER — Ambulatory Visit
Admission: RE | Admit: 2019-09-01 | Discharge: 2019-09-01 | Disposition: A | Discharge status: Home / Self Care | Payer: Medicare Other | Source: Ambulatory Visit | Attending: Radiation Oncology | Admitting: Radiation Oncology

## 2019-09-01 ENCOUNTER — Other Ambulatory Visit: Payer: Self-pay

## 2019-09-01 DIAGNOSIS — C07 Malignant neoplasm of parotid gland: Secondary | ICD-10-CM | POA: Diagnosis not present

## 2019-09-01 DIAGNOSIS — Z79899 Other long term (current) drug therapy: Secondary | ICD-10-CM | POA: Diagnosis not present

## 2019-09-01 DIAGNOSIS — C4440 Unspecified malignant neoplasm of skin of scalp and neck: Secondary | ICD-10-CM | POA: Diagnosis not present

## 2019-09-01 DIAGNOSIS — Z510 Encounter for antineoplastic radiation therapy: Secondary | ICD-10-CM | POA: Diagnosis not present

## 2019-09-04 ENCOUNTER — Other Ambulatory Visit: Payer: Self-pay

## 2019-09-04 ENCOUNTER — Ambulatory Visit
Admission: RE | Admit: 2019-09-04 | Discharge: 2019-09-04 | Disposition: A | Discharge status: Home / Self Care | Payer: Medicare Other | Source: Ambulatory Visit | Attending: Radiation Oncology | Admitting: Radiation Oncology

## 2019-09-04 DIAGNOSIS — C07 Malignant neoplasm of parotid gland: Secondary | ICD-10-CM | POA: Diagnosis not present

## 2019-09-04 DIAGNOSIS — Z79899 Other long term (current) drug therapy: Secondary | ICD-10-CM | POA: Diagnosis not present

## 2019-09-04 DIAGNOSIS — C4440 Unspecified malignant neoplasm of skin of scalp and neck: Secondary | ICD-10-CM | POA: Diagnosis not present

## 2019-09-04 DIAGNOSIS — Z510 Encounter for antineoplastic radiation therapy: Secondary | ICD-10-CM | POA: Diagnosis not present

## 2019-09-05 ENCOUNTER — Other Ambulatory Visit: Payer: Self-pay

## 2019-09-05 ENCOUNTER — Ambulatory Visit
Admission: RE | Admit: 2019-09-05 | Discharge: 2019-09-05 | Disposition: A | Discharge status: Home / Self Care | Payer: Medicare Other | Source: Ambulatory Visit | Attending: Radiation Oncology | Admitting: Radiation Oncology

## 2019-09-05 DIAGNOSIS — Z510 Encounter for antineoplastic radiation therapy: Secondary | ICD-10-CM | POA: Diagnosis not present

## 2019-09-05 DIAGNOSIS — Z79899 Other long term (current) drug therapy: Secondary | ICD-10-CM | POA: Diagnosis not present

## 2019-09-05 DIAGNOSIS — C07 Malignant neoplasm of parotid gland: Secondary | ICD-10-CM | POA: Diagnosis not present

## 2019-09-05 DIAGNOSIS — C4440 Unspecified malignant neoplasm of skin of scalp and neck: Secondary | ICD-10-CM | POA: Diagnosis not present

## 2019-09-06 ENCOUNTER — Inpatient Hospital Stay: Payer: Medicare Other | Attending: Oncology | Admitting: Nutrition

## 2019-09-06 ENCOUNTER — Other Ambulatory Visit: Payer: Self-pay

## 2019-09-06 ENCOUNTER — Telehealth: Payer: Self-pay | Admitting: Nutrition

## 2019-09-06 ENCOUNTER — Ambulatory Visit
Admission: RE | Admit: 2019-09-06 | Discharge: 2019-09-06 | Disposition: A | Discharge status: Home / Self Care | Payer: Medicare Other | Source: Ambulatory Visit | Attending: Radiation Oncology | Admitting: Radiation Oncology

## 2019-09-06 DIAGNOSIS — Z79899 Other long term (current) drug therapy: Secondary | ICD-10-CM | POA: Diagnosis not present

## 2019-09-06 DIAGNOSIS — C4440 Unspecified malignant neoplasm of skin of scalp and neck: Secondary | ICD-10-CM | POA: Diagnosis not present

## 2019-09-06 DIAGNOSIS — C07 Malignant neoplasm of parotid gland: Secondary | ICD-10-CM | POA: Diagnosis not present

## 2019-09-06 DIAGNOSIS — Z510 Encounter for antineoplastic radiation therapy: Secondary | ICD-10-CM | POA: Diagnosis not present

## 2019-09-06 NOTE — Telephone Encounter (Signed)
Telephone follow-up was scheduled with patient's wife today.  Contacted her by telephone.  She is not available but I did leave a message.  Chart review reveals patient's weight has improved to 166.6 pounds on March 22.  He has completed 10 out of 33 radiation therapy treatments.  We will follow-up next week.

## 2019-09-07 ENCOUNTER — Ambulatory Visit
Admission: RE | Admit: 2019-09-07 | Discharge: 2019-09-07 | Disposition: A | Discharge status: Home / Self Care | Payer: Medicare Other | Source: Ambulatory Visit | Attending: Radiation Oncology | Admitting: Radiation Oncology

## 2019-09-07 ENCOUNTER — Other Ambulatory Visit: Payer: Self-pay

## 2019-09-07 DIAGNOSIS — C4440 Unspecified malignant neoplasm of skin of scalp and neck: Secondary | ICD-10-CM | POA: Diagnosis not present

## 2019-09-07 DIAGNOSIS — Z79899 Other long term (current) drug therapy: Secondary | ICD-10-CM | POA: Diagnosis not present

## 2019-09-07 DIAGNOSIS — Z510 Encounter for antineoplastic radiation therapy: Secondary | ICD-10-CM | POA: Diagnosis not present

## 2019-09-07 DIAGNOSIS — C07 Malignant neoplasm of parotid gland: Secondary | ICD-10-CM | POA: Diagnosis not present

## 2019-09-08 ENCOUNTER — Other Ambulatory Visit: Payer: Self-pay

## 2019-09-08 ENCOUNTER — Ambulatory Visit
Admission: RE | Admit: 2019-09-08 | Discharge: 2019-09-08 | Disposition: A | Discharge status: Home / Self Care | Payer: Medicare Other | Source: Ambulatory Visit | Attending: Radiation Oncology | Admitting: Radiation Oncology

## 2019-09-08 DIAGNOSIS — Z510 Encounter for antineoplastic radiation therapy: Secondary | ICD-10-CM | POA: Diagnosis not present

## 2019-09-08 DIAGNOSIS — Z79899 Other long term (current) drug therapy: Secondary | ICD-10-CM | POA: Diagnosis not present

## 2019-09-08 DIAGNOSIS — C07 Malignant neoplasm of parotid gland: Secondary | ICD-10-CM | POA: Diagnosis not present

## 2019-09-08 DIAGNOSIS — C4440 Unspecified malignant neoplasm of skin of scalp and neck: Secondary | ICD-10-CM | POA: Diagnosis not present

## 2019-09-11 ENCOUNTER — Other Ambulatory Visit: Payer: Self-pay

## 2019-09-11 ENCOUNTER — Ambulatory Visit
Admission: RE | Admit: 2019-09-11 | Discharge: 2019-09-11 | Disposition: A | Discharge status: Home / Self Care | Payer: Medicare Other | Source: Ambulatory Visit | Attending: Radiation Oncology | Admitting: Radiation Oncology

## 2019-09-11 DIAGNOSIS — C4440 Unspecified malignant neoplasm of skin of scalp and neck: Secondary | ICD-10-CM | POA: Diagnosis not present

## 2019-09-11 DIAGNOSIS — Z79899 Other long term (current) drug therapy: Secondary | ICD-10-CM | POA: Diagnosis not present

## 2019-09-11 DIAGNOSIS — Z510 Encounter for antineoplastic radiation therapy: Secondary | ICD-10-CM | POA: Diagnosis not present

## 2019-09-11 DIAGNOSIS — C07 Malignant neoplasm of parotid gland: Secondary | ICD-10-CM | POA: Diagnosis not present

## 2019-09-11 MED ORDER — SONAFINE EX EMUL
1.0000 "application " | Freq: Two times a day (BID) | CUTANEOUS | Status: DC
  Administered 2019-09-11: 1 via TOPICAL

## 2019-09-12 ENCOUNTER — Telehealth: Payer: Self-pay | Admitting: Nutrition

## 2019-09-12 ENCOUNTER — Other Ambulatory Visit: Payer: Self-pay

## 2019-09-12 ENCOUNTER — Inpatient Hospital Stay: Payer: Medicare Other | Admitting: Nutrition

## 2019-09-12 ENCOUNTER — Ambulatory Visit
Admission: RE | Admit: 2019-09-12 | Discharge: 2019-09-12 | Disposition: A | Discharge status: Home / Self Care | Payer: Medicare Other | Source: Ambulatory Visit | Attending: Radiation Oncology | Admitting: Radiation Oncology

## 2019-09-12 DIAGNOSIS — C07 Malignant neoplasm of parotid gland: Secondary | ICD-10-CM | POA: Diagnosis not present

## 2019-09-12 DIAGNOSIS — Z79899 Other long term (current) drug therapy: Secondary | ICD-10-CM | POA: Diagnosis not present

## 2019-09-12 DIAGNOSIS — Z510 Encounter for antineoplastic radiation therapy: Secondary | ICD-10-CM | POA: Diagnosis not present

## 2019-09-12 DIAGNOSIS — C4440 Unspecified malignant neoplasm of skin of scalp and neck: Secondary | ICD-10-CM | POA: Diagnosis not present

## 2019-09-12 NOTE — Progress Notes (Signed)
Contacted patient's wife by telephone.  (Patient has dementia.) She reports patient seems to be doing well.   He has a good appetite.   She does not think he has had difficulty swallowing.   Noted weight decreased and documented as 163.8 pounds on March 29 down from 166.6 pounds March 22.   She is trying to provide oral nutrition supplements to patient.  He does not love them however she is getting some into him.  Nutrition diagnosis: Unintended weight loss continues.  Intervention: Provided additional samples of oral nutrition supplements.   Provided support and encouragement for patient's wife to continue strategies for increasing calories and protein.  Monitoring, evaluation, goals: Patient will tolerate adequate calories and protein to minimize weight loss.  Next visit: Wednesday, April 7 by telephone.  **Disclaimer: This note was dictated with voice recognition software. Similar sounding words can inadvertently be transcribed and this note may contain transcription errors which may not have been corrected upon publication of note.**

## 2019-09-12 NOTE — Telephone Encounter (Signed)
Spoke with patient's wife over the telephone.  See encounter notes for details.

## 2019-09-13 ENCOUNTER — Other Ambulatory Visit: Payer: Self-pay

## 2019-09-13 ENCOUNTER — Ambulatory Visit
Admission: RE | Admit: 2019-09-13 | Discharge: 2019-09-13 | Disposition: A | Discharge status: Home / Self Care | Payer: Medicare Other | Source: Ambulatory Visit | Attending: Radiation Oncology | Admitting: Radiation Oncology

## 2019-09-13 DIAGNOSIS — Z510 Encounter for antineoplastic radiation therapy: Secondary | ICD-10-CM | POA: Diagnosis not present

## 2019-09-13 DIAGNOSIS — C07 Malignant neoplasm of parotid gland: Secondary | ICD-10-CM | POA: Diagnosis not present

## 2019-09-13 DIAGNOSIS — Z79899 Other long term (current) drug therapy: Secondary | ICD-10-CM | POA: Diagnosis not present

## 2019-09-13 DIAGNOSIS — C4440 Unspecified malignant neoplasm of skin of scalp and neck: Secondary | ICD-10-CM | POA: Diagnosis not present

## 2019-09-14 ENCOUNTER — Ambulatory Visit
Admission: RE | Admit: 2019-09-14 | Discharge: 2019-09-14 | Disposition: A | Discharge status: Home / Self Care | Payer: Medicare Other | Source: Ambulatory Visit | Attending: Radiation Oncology | Admitting: Radiation Oncology

## 2019-09-14 ENCOUNTER — Other Ambulatory Visit: Payer: Self-pay

## 2019-09-14 DIAGNOSIS — Z510 Encounter for antineoplastic radiation therapy: Secondary | ICD-10-CM | POA: Insufficient documentation

## 2019-09-14 DIAGNOSIS — C07 Malignant neoplasm of parotid gland: Secondary | ICD-10-CM | POA: Diagnosis not present

## 2019-09-14 DIAGNOSIS — Z79899 Other long term (current) drug therapy: Secondary | ICD-10-CM | POA: Insufficient documentation

## 2019-09-14 DIAGNOSIS — C4440 Unspecified malignant neoplasm of skin of scalp and neck: Secondary | ICD-10-CM | POA: Diagnosis not present

## 2019-09-15 ENCOUNTER — Ambulatory Visit
Admission: RE | Admit: 2019-09-15 | Discharge: 2019-09-15 | Disposition: A | Discharge status: Home / Self Care | Payer: Medicare Other | Source: Ambulatory Visit | Attending: Radiation Oncology | Admitting: Radiation Oncology

## 2019-09-15 ENCOUNTER — Other Ambulatory Visit: Payer: Self-pay

## 2019-09-15 DIAGNOSIS — C4440 Unspecified malignant neoplasm of skin of scalp and neck: Secondary | ICD-10-CM | POA: Diagnosis not present

## 2019-09-15 DIAGNOSIS — Z510 Encounter for antineoplastic radiation therapy: Secondary | ICD-10-CM | POA: Diagnosis not present

## 2019-09-15 DIAGNOSIS — C07 Malignant neoplasm of parotid gland: Secondary | ICD-10-CM | POA: Diagnosis not present

## 2019-09-15 DIAGNOSIS — Z79899 Other long term (current) drug therapy: Secondary | ICD-10-CM | POA: Diagnosis not present

## 2019-09-18 ENCOUNTER — Ambulatory Visit
Admission: RE | Admit: 2019-09-18 | Discharge: 2019-09-18 | Disposition: A | Discharge status: Home / Self Care | Payer: Medicare Other | Source: Ambulatory Visit | Attending: Radiation Oncology | Admitting: Radiation Oncology

## 2019-09-18 ENCOUNTER — Other Ambulatory Visit: Payer: Self-pay

## 2019-09-18 DIAGNOSIS — Z79899 Other long term (current) drug therapy: Secondary | ICD-10-CM | POA: Diagnosis not present

## 2019-09-18 DIAGNOSIS — Z510 Encounter for antineoplastic radiation therapy: Secondary | ICD-10-CM | POA: Diagnosis not present

## 2019-09-18 DIAGNOSIS — C4440 Unspecified malignant neoplasm of skin of scalp and neck: Secondary | ICD-10-CM | POA: Diagnosis not present

## 2019-09-18 DIAGNOSIS — C07 Malignant neoplasm of parotid gland: Secondary | ICD-10-CM | POA: Diagnosis not present

## 2019-09-19 ENCOUNTER — Other Ambulatory Visit: Payer: Self-pay

## 2019-09-19 ENCOUNTER — Ambulatory Visit
Admission: RE | Admit: 2019-09-19 | Discharge: 2019-09-19 | Disposition: A | Discharge status: Home / Self Care | Payer: Medicare Other | Source: Ambulatory Visit | Attending: Radiation Oncology | Admitting: Radiation Oncology

## 2019-09-19 DIAGNOSIS — Z510 Encounter for antineoplastic radiation therapy: Secondary | ICD-10-CM | POA: Diagnosis not present

## 2019-09-19 DIAGNOSIS — C07 Malignant neoplasm of parotid gland: Secondary | ICD-10-CM | POA: Diagnosis not present

## 2019-09-19 DIAGNOSIS — C4440 Unspecified malignant neoplasm of skin of scalp and neck: Secondary | ICD-10-CM | POA: Diagnosis not present

## 2019-09-19 DIAGNOSIS — Z79899 Other long term (current) drug therapy: Secondary | ICD-10-CM | POA: Diagnosis not present

## 2019-09-20 ENCOUNTER — Ambulatory Visit: Payer: Medicare Other | Admitting: Nutrition

## 2019-09-20 ENCOUNTER — Other Ambulatory Visit: Payer: Self-pay

## 2019-09-20 ENCOUNTER — Ambulatory Visit
Admission: RE | Admit: 2019-09-20 | Discharge: 2019-09-20 | Disposition: A | Discharge status: Home / Self Care | Payer: Medicare Other | Source: Ambulatory Visit | Attending: Radiation Oncology | Admitting: Radiation Oncology

## 2019-09-20 DIAGNOSIS — C4440 Unspecified malignant neoplasm of skin of scalp and neck: Secondary | ICD-10-CM | POA: Diagnosis not present

## 2019-09-20 DIAGNOSIS — C07 Malignant neoplasm of parotid gland: Secondary | ICD-10-CM | POA: Diagnosis not present

## 2019-09-20 DIAGNOSIS — Z79899 Other long term (current) drug therapy: Secondary | ICD-10-CM | POA: Diagnosis not present

## 2019-09-20 DIAGNOSIS — Z510 Encounter for antineoplastic radiation therapy: Secondary | ICD-10-CM | POA: Diagnosis not present

## 2019-09-20 NOTE — Progress Notes (Signed)
Contacted patient for telephone follow-up.  I usually speak with patient's wife secondary to patient having dementia.  She was not available however I left a message with my name and phone number for return call.

## 2019-09-21 ENCOUNTER — Ambulatory Visit
Admission: RE | Admit: 2019-09-21 | Discharge: 2019-09-21 | Disposition: A | Discharge status: Home / Self Care | Payer: Medicare Other | Source: Ambulatory Visit | Attending: Radiation Oncology | Admitting: Radiation Oncology

## 2019-09-21 ENCOUNTER — Other Ambulatory Visit: Payer: Self-pay

## 2019-09-21 DIAGNOSIS — Z510 Encounter for antineoplastic radiation therapy: Secondary | ICD-10-CM | POA: Diagnosis not present

## 2019-09-21 DIAGNOSIS — C4440 Unspecified malignant neoplasm of skin of scalp and neck: Secondary | ICD-10-CM | POA: Diagnosis not present

## 2019-09-21 DIAGNOSIS — C07 Malignant neoplasm of parotid gland: Secondary | ICD-10-CM | POA: Diagnosis not present

## 2019-09-21 DIAGNOSIS — Z79899 Other long term (current) drug therapy: Secondary | ICD-10-CM | POA: Diagnosis not present

## 2019-09-22 ENCOUNTER — Other Ambulatory Visit: Payer: Self-pay

## 2019-09-22 ENCOUNTER — Ambulatory Visit
Admission: RE | Admit: 2019-09-22 | Discharge: 2019-09-22 | Disposition: A | Discharge status: Home / Self Care | Payer: Medicare Other | Source: Ambulatory Visit | Attending: Radiation Oncology | Admitting: Radiation Oncology

## 2019-09-22 DIAGNOSIS — C07 Malignant neoplasm of parotid gland: Secondary | ICD-10-CM | POA: Diagnosis not present

## 2019-09-22 DIAGNOSIS — Z79899 Other long term (current) drug therapy: Secondary | ICD-10-CM | POA: Diagnosis not present

## 2019-09-22 DIAGNOSIS — C4440 Unspecified malignant neoplasm of skin of scalp and neck: Secondary | ICD-10-CM | POA: Diagnosis not present

## 2019-09-22 DIAGNOSIS — Z510 Encounter for antineoplastic radiation therapy: Secondary | ICD-10-CM | POA: Diagnosis not present

## 2019-09-25 ENCOUNTER — Ambulatory Visit
Admission: RE | Admit: 2019-09-25 | Discharge: 2019-09-25 | Disposition: A | Discharge status: Home / Self Care | Payer: Medicare Other | Source: Ambulatory Visit | Attending: Radiation Oncology | Admitting: Radiation Oncology

## 2019-09-25 ENCOUNTER — Other Ambulatory Visit: Payer: Self-pay

## 2019-09-25 DIAGNOSIS — C07 Malignant neoplasm of parotid gland: Secondary | ICD-10-CM | POA: Diagnosis not present

## 2019-09-25 DIAGNOSIS — Z79899 Other long term (current) drug therapy: Secondary | ICD-10-CM | POA: Diagnosis not present

## 2019-09-25 DIAGNOSIS — C4440 Unspecified malignant neoplasm of skin of scalp and neck: Secondary | ICD-10-CM | POA: Diagnosis not present

## 2019-09-25 DIAGNOSIS — Z510 Encounter for antineoplastic radiation therapy: Secondary | ICD-10-CM | POA: Diagnosis not present

## 2019-09-26 ENCOUNTER — Other Ambulatory Visit: Payer: Self-pay

## 2019-09-26 ENCOUNTER — Ambulatory Visit
Admission: RE | Admit: 2019-09-26 | Discharge: 2019-09-26 | Disposition: A | Discharge status: Home / Self Care | Payer: Medicare Other | Source: Ambulatory Visit | Attending: Radiation Oncology | Admitting: Radiation Oncology

## 2019-09-26 DIAGNOSIS — Z79899 Other long term (current) drug therapy: Secondary | ICD-10-CM | POA: Diagnosis not present

## 2019-09-26 DIAGNOSIS — C4440 Unspecified malignant neoplasm of skin of scalp and neck: Secondary | ICD-10-CM | POA: Diagnosis not present

## 2019-09-26 DIAGNOSIS — Z510 Encounter for antineoplastic radiation therapy: Secondary | ICD-10-CM | POA: Diagnosis not present

## 2019-09-26 DIAGNOSIS — C07 Malignant neoplasm of parotid gland: Secondary | ICD-10-CM | POA: Diagnosis not present

## 2019-09-27 ENCOUNTER — Other Ambulatory Visit: Payer: Self-pay

## 2019-09-27 ENCOUNTER — Inpatient Hospital Stay: Payer: Medicare Other | Attending: Oncology | Admitting: Nutrition

## 2019-09-27 ENCOUNTER — Ambulatory Visit
Admission: RE | Admit: 2019-09-27 | Discharge: 2019-09-27 | Disposition: A | Discharge status: Home / Self Care | Payer: Medicare Other | Source: Ambulatory Visit | Attending: Radiation Oncology | Admitting: Radiation Oncology

## 2019-09-27 DIAGNOSIS — Z510 Encounter for antineoplastic radiation therapy: Secondary | ICD-10-CM | POA: Diagnosis not present

## 2019-09-27 DIAGNOSIS — C4440 Unspecified malignant neoplasm of skin of scalp and neck: Secondary | ICD-10-CM | POA: Diagnosis not present

## 2019-09-27 DIAGNOSIS — Z79899 Other long term (current) drug therapy: Secondary | ICD-10-CM | POA: Diagnosis not present

## 2019-09-27 DIAGNOSIS — C07 Malignant neoplasm of parotid gland: Secondary | ICD-10-CM | POA: Diagnosis not present

## 2019-09-27 NOTE — Progress Notes (Signed)
Nutrition follow-up was completed over the telephone.  I spoke with patient's wife. She reports patient's appetite is decreasing. She believes it is difficult for him to chew. He is drinking some Ensure and she has tried some other soft protein foods with some success. Weight decreased and documented as 159.8 pounds on April 12.  Nutrition diagnosis: Unintended weight loss continues.  Intervention: Educated patient's wife on additional high-calorie and high-protein foods that are soft to offer patient. Encouraged her to try some different recipes using Ensure Enlive. Provided additional fact sheets and recipes along with samples and coupons.  Monitoring, evaluation, goals: Patient will tolerate increased calories and protein to minimize further weight loss.  Next visit: Wednesday, April 21 by telephone.  **Disclaimer: This note was dictated with voice recognition software. Similar sounding words can inadvertently be transcribed and this note may contain transcription errors which may not have been corrected upon publication of note.**

## 2019-09-28 ENCOUNTER — Other Ambulatory Visit: Payer: Self-pay

## 2019-09-28 ENCOUNTER — Ambulatory Visit
Admission: RE | Admit: 2019-09-28 | Discharge: 2019-09-28 | Disposition: A | Discharge status: Home / Self Care | Payer: Medicare Other | Source: Ambulatory Visit | Attending: Radiation Oncology | Admitting: Radiation Oncology

## 2019-09-28 DIAGNOSIS — Z510 Encounter for antineoplastic radiation therapy: Secondary | ICD-10-CM | POA: Diagnosis not present

## 2019-09-28 DIAGNOSIS — C4440 Unspecified malignant neoplasm of skin of scalp and neck: Secondary | ICD-10-CM | POA: Diagnosis not present

## 2019-09-28 DIAGNOSIS — Z79899 Other long term (current) drug therapy: Secondary | ICD-10-CM | POA: Diagnosis not present

## 2019-09-28 DIAGNOSIS — C07 Malignant neoplasm of parotid gland: Secondary | ICD-10-CM | POA: Diagnosis not present

## 2019-09-29 ENCOUNTER — Ambulatory Visit
Admission: RE | Admit: 2019-09-29 | Discharge: 2019-09-29 | Disposition: A | Discharge status: Home / Self Care | Payer: Medicare Other | Source: Ambulatory Visit | Attending: Radiation Oncology | Admitting: Radiation Oncology

## 2019-09-29 ENCOUNTER — Other Ambulatory Visit: Payer: Self-pay

## 2019-09-29 DIAGNOSIS — Z510 Encounter for antineoplastic radiation therapy: Secondary | ICD-10-CM | POA: Diagnosis not present

## 2019-09-29 DIAGNOSIS — C07 Malignant neoplasm of parotid gland: Secondary | ICD-10-CM | POA: Diagnosis not present

## 2019-09-29 DIAGNOSIS — C4440 Unspecified malignant neoplasm of skin of scalp and neck: Secondary | ICD-10-CM | POA: Diagnosis not present

## 2019-09-29 DIAGNOSIS — Z79899 Other long term (current) drug therapy: Secondary | ICD-10-CM | POA: Diagnosis not present

## 2019-10-02 ENCOUNTER — Other Ambulatory Visit: Payer: Self-pay

## 2019-10-02 ENCOUNTER — Ambulatory Visit
Admission: RE | Admit: 2019-10-02 | Discharge: 2019-10-02 | Disposition: A | Discharge status: Home / Self Care | Payer: Medicare Other | Source: Ambulatory Visit | Attending: Radiation Oncology | Admitting: Radiation Oncology

## 2019-10-02 DIAGNOSIS — Z79899 Other long term (current) drug therapy: Secondary | ICD-10-CM | POA: Diagnosis not present

## 2019-10-02 DIAGNOSIS — Z510 Encounter for antineoplastic radiation therapy: Secondary | ICD-10-CM | POA: Diagnosis not present

## 2019-10-02 DIAGNOSIS — C4440 Unspecified malignant neoplasm of skin of scalp and neck: Secondary | ICD-10-CM | POA: Diagnosis not present

## 2019-10-02 DIAGNOSIS — C07 Malignant neoplasm of parotid gland: Secondary | ICD-10-CM | POA: Diagnosis not present

## 2019-10-02 MED ORDER — SONAFINE EX EMUL
1.0000 "application " | Freq: Two times a day (BID) | CUTANEOUS | Status: DC
  Administered 2019-10-02: 1 via TOPICAL

## 2019-10-03 ENCOUNTER — Ambulatory Visit
Admission: RE | Admit: 2019-10-03 | Discharge: 2019-10-03 | Disposition: A | Discharge status: Home / Self Care | Payer: Medicare Other | Source: Ambulatory Visit | Attending: Radiation Oncology | Admitting: Radiation Oncology

## 2019-10-03 ENCOUNTER — Other Ambulatory Visit: Payer: Self-pay

## 2019-10-03 DIAGNOSIS — Z79899 Other long term (current) drug therapy: Secondary | ICD-10-CM | POA: Diagnosis not present

## 2019-10-03 DIAGNOSIS — Z510 Encounter for antineoplastic radiation therapy: Secondary | ICD-10-CM | POA: Diagnosis not present

## 2019-10-03 DIAGNOSIS — C07 Malignant neoplasm of parotid gland: Secondary | ICD-10-CM | POA: Diagnosis not present

## 2019-10-03 DIAGNOSIS — C4440 Unspecified malignant neoplasm of skin of scalp and neck: Secondary | ICD-10-CM | POA: Diagnosis not present

## 2019-10-04 ENCOUNTER — Inpatient Hospital Stay: Payer: Medicare Other | Admitting: Nutrition

## 2019-10-04 ENCOUNTER — Other Ambulatory Visit: Payer: Self-pay

## 2019-10-04 ENCOUNTER — Ambulatory Visit
Admission: RE | Admit: 2019-10-04 | Discharge: 2019-10-04 | Disposition: A | Discharge status: Home / Self Care | Payer: Medicare Other | Source: Ambulatory Visit | Attending: Radiation Oncology | Admitting: Radiation Oncology

## 2019-10-04 DIAGNOSIS — Z510 Encounter for antineoplastic radiation therapy: Secondary | ICD-10-CM | POA: Diagnosis not present

## 2019-10-04 DIAGNOSIS — C07 Malignant neoplasm of parotid gland: Secondary | ICD-10-CM | POA: Diagnosis not present

## 2019-10-04 DIAGNOSIS — C4440 Unspecified malignant neoplasm of skin of scalp and neck: Secondary | ICD-10-CM | POA: Diagnosis not present

## 2019-10-04 DIAGNOSIS — Z79899 Other long term (current) drug therapy: Secondary | ICD-10-CM | POA: Diagnosis not present

## 2019-10-04 NOTE — Progress Notes (Signed)
Contacted patient's wife by telephone for nutrition follow-up.  She was not available however I left a message with my contact information and requested a return call.  Next follow-up has been scheduled for patient's final radiation treatment day on Monday, April 26.

## 2019-10-05 ENCOUNTER — Other Ambulatory Visit: Payer: Self-pay

## 2019-10-05 ENCOUNTER — Ambulatory Visit
Admission: RE | Admit: 2019-10-05 | Discharge: 2019-10-05 | Disposition: A | Discharge status: Home / Self Care | Payer: Medicare Other | Source: Ambulatory Visit | Attending: Radiation Oncology | Admitting: Radiation Oncology

## 2019-10-05 DIAGNOSIS — Z79899 Other long term (current) drug therapy: Secondary | ICD-10-CM | POA: Diagnosis not present

## 2019-10-05 DIAGNOSIS — C4440 Unspecified malignant neoplasm of skin of scalp and neck: Secondary | ICD-10-CM | POA: Diagnosis not present

## 2019-10-05 DIAGNOSIS — C07 Malignant neoplasm of parotid gland: Secondary | ICD-10-CM | POA: Diagnosis not present

## 2019-10-05 DIAGNOSIS — Z510 Encounter for antineoplastic radiation therapy: Secondary | ICD-10-CM | POA: Diagnosis not present

## 2019-10-06 ENCOUNTER — Other Ambulatory Visit: Payer: Self-pay

## 2019-10-06 ENCOUNTER — Ambulatory Visit
Admission: RE | Admit: 2019-10-06 | Discharge: 2019-10-06 | Disposition: A | Discharge status: Home / Self Care | Payer: Medicare Other | Source: Ambulatory Visit | Attending: Radiation Oncology | Admitting: Radiation Oncology

## 2019-10-06 DIAGNOSIS — C4440 Unspecified malignant neoplasm of skin of scalp and neck: Secondary | ICD-10-CM | POA: Diagnosis not present

## 2019-10-06 DIAGNOSIS — C07 Malignant neoplasm of parotid gland: Secondary | ICD-10-CM | POA: Diagnosis not present

## 2019-10-06 DIAGNOSIS — Z510 Encounter for antineoplastic radiation therapy: Secondary | ICD-10-CM | POA: Diagnosis not present

## 2019-10-06 DIAGNOSIS — Z79899 Other long term (current) drug therapy: Secondary | ICD-10-CM | POA: Diagnosis not present

## 2019-10-09 ENCOUNTER — Inpatient Hospital Stay: Payer: Medicare Other | Admitting: Nutrition

## 2019-10-09 ENCOUNTER — Encounter: Payer: Self-pay | Admitting: Radiation Oncology

## 2019-10-09 ENCOUNTER — Ambulatory Visit
Admission: RE | Admit: 2019-10-09 | Discharge: 2019-10-09 | Disposition: A | Discharge status: Home / Self Care | Payer: Medicare Other | Source: Ambulatory Visit | Attending: Radiation Oncology | Admitting: Radiation Oncology

## 2019-10-09 ENCOUNTER — Other Ambulatory Visit: Payer: Self-pay

## 2019-10-09 DIAGNOSIS — Z510 Encounter for antineoplastic radiation therapy: Secondary | ICD-10-CM | POA: Diagnosis not present

## 2019-10-09 DIAGNOSIS — C4440 Unspecified malignant neoplasm of skin of scalp and neck: Secondary | ICD-10-CM

## 2019-10-09 DIAGNOSIS — Z79899 Other long term (current) drug therapy: Secondary | ICD-10-CM | POA: Diagnosis not present

## 2019-10-09 DIAGNOSIS — C07 Malignant neoplasm of parotid gland: Secondary | ICD-10-CM | POA: Diagnosis not present

## 2019-10-09 MED ORDER — SONAFINE EX EMUL
1.0000 "application " | Freq: Two times a day (BID) | CUTANEOUS | Status: DC
  Administered 2019-10-09: 1 via TOPICAL

## 2019-10-09 NOTE — Progress Notes (Signed)
Talked with patient's wife over the telephone.  Patient has completed his radiation therapy today.  Weight was documented as 158.6 pounds on April 26 down from 159.8 pounds April 12.  Patient's appetite is still decreased but patient's wife continues to offer high-calorie high-protein foods.  She tries to get him to drink at least 1 Ensure [place] a day.  She has no new concerns.  Nutrition diagnosis: Unintended weight loss continues but has slowed down.  Intervention: Provided support and encouragement to patient's wife to continue to offer high-calorie high-protein foods.  Offer Ensure Enlive 1-2 bottles daily.  Monitoring, evaluation, goals: Patient will tolerate increased calories and protein to minimize weight loss.  Patient's wife to contact me with any questions or concerns no follow-up has been scheduled.  **Disclaimer: This note was dictated with voice recognition software. Similar sounding words can inadvertently be transcribed and this note may contain transcription errors which may not have been corrected upon publication of note.**

## 2019-10-09 NOTE — Progress Notes (Signed)
Oncology Nurse Navigator Documentation  Met with Mr. Vold and his wife, [name] after final RT to offer support and to celebrate end of radiation treatment.   Provided verbal/written post-RT guidance:  Importance of keeping all follow-up appts, including Nutrition.  Importance of protecting treatment area from sun.  Continuation of Sonafine application 2-3 times daily, application of antibiotic ointment to areas of raw skin; when supply of Sonafine exhausted transition to OTC lotion with vitamin E. Provided/reviewed Epic calendar of upcoming appts. Explained my role as navigator will continue for several more months, encouraged them to call me with needs/concerns.    Harlow Asa RN, BSN, OCN Head & Neck Oncology Nurse [place] at [facility] Phone # [phone]  Fax # [phone]

## 2019-10-10 DIAGNOSIS — C678 Malignant neoplasm of overlapping sites of bladder: Secondary | ICD-10-CM | POA: Diagnosis not present

## 2019-10-10 DIAGNOSIS — R8271 Bacteriuria: Secondary | ICD-10-CM | POA: Diagnosis not present

## 2019-10-10 DIAGNOSIS — N131 Hydronephrosis with ureteral stricture, not elsewhere classified: Secondary | ICD-10-CM | POA: Diagnosis not present

## 2019-10-11 DIAGNOSIS — L570 Actinic keratosis: Secondary | ICD-10-CM | POA: Diagnosis not present

## 2019-10-11 DIAGNOSIS — Z85828 Personal history of other malignant neoplasm of skin: Secondary | ICD-10-CM | POA: Diagnosis not present

## 2019-10-11 DIAGNOSIS — D692 Other nonthrombocytopenic purpura: Secondary | ICD-10-CM | POA: Diagnosis not present

## 2019-10-11 DIAGNOSIS — D1801 Hemangioma of skin and subcutaneous tissue: Secondary | ICD-10-CM | POA: Diagnosis not present

## 2019-10-12 ENCOUNTER — Other Ambulatory Visit: Payer: Self-pay | Admitting: Urology

## 2019-10-18 DIAGNOSIS — I10 Essential (primary) hypertension: Secondary | ICD-10-CM | POA: Diagnosis not present

## 2019-10-18 DIAGNOSIS — E1165 Type 2 diabetes mellitus with hyperglycemia: Secondary | ICD-10-CM | POA: Diagnosis not present

## 2019-10-18 DIAGNOSIS — E118 Type 2 diabetes mellitus with unspecified complications: Secondary | ICD-10-CM | POA: Diagnosis not present

## 2019-10-19 NOTE — Patient Instructions (Addendum)
DUE TO COVID-19 ONLY ONE VISITOR IS ALLOWED TO COME WITH YOU AND STAY IN THE WAITING ROOM ONLY DURING PRE OP AND PROCEDURE DAY OF SURGERY. THE 1 VISITOR MAY VISIT WITH YOU AFTER SURGERY IN YOUR PRIVATE ROOM DURING VISITING HOURS ONLY!  YOU NEED TO HAVE A COVID 19 TEST ON__5/13_____ @_10 :00______, THIS TEST MUST BE DONE BEFORE SURGERY, COME  [place] , [address].  ([place]) ONCE YOUR COVID TEST IS COMPLETED, PLEASE BEGIN THE QUARANTINE INSTRUCTIONS AS OUTLINED IN YOUR HANDOUT.                Ernesto Rutherford    Your procedure is scheduled on: 10/30/19   Report to [facility] Main  Entrance   Report to admitting at   11:00 AM     Call this number if you have problems the morning of surgery [phone]    . BRUSH YOUR TEETH MORNING OF SURGERY AND RINSE YOUR MOUTH OUT, NO CHEWING GUM CANDY OR MINTS.     Take these medicines the morning of surgery with A SIP OF WATER: Metoprolol, Memantine  How to Manage Your Diabetes Before and After Surgery  Why is it important to control my blood sugar before and after surgery? . Improving blood sugar levels before and after surgery helps healing and can limit problems. . A way of improving blood sugar control is eating a healthy diet by: o  Eating less sugar and carbohydrates o  Increasing activity/exercise o  Talking with your doctor about reaching your blood sugar goals . High blood sugars (greater than 180 mg/dL) can raise your risk of infections and slow your recovery, so you will need to focus on controlling your diabetes during the weeks before surgery. . Make sure that the doctor who takes care of your diabetes knows about your planned surgery including the date and location.  How do I manage my blood sugar before surgery? . Check your blood sugar at least 4 times a day, starting 2 days before surgery, to make sure that the level is not too high or low. o Check your blood sugar the morning of  your surgery when you wake up and every 2 hours until you get to the Short Stay unit. . If your blood sugar is less than 70 mg/dL, you will need to treat for low blood sugar: o Do not take insulin. o Treat a low blood sugar (less than 70 mg/dL) with  cup of clear juice (cranberry or apple), 4 glucose tablets, OR glucose gel. o Recheck blood sugar in 15 minutes after treatment (to make sure it is greater than 70 mg/dL). If your blood sugar is not greater than 70 mg/dL on recheck, call [phone] for further instructions. . Report your blood sugar to the short stay nurse when you get to Short Stay.  . If you are admitted to the hospital after surgery: o Your blood sugar will be checked by the staff and you will probably be given insulin after surgery (instead of oral diabetes medicines) to make sure you have good blood sugar levels. o The goal for blood sugar control after surgery is 80-180 mg/dL.   WHAT DO I DO ABOUT MY DIABETES MEDICATION?  [name] Do not take oral diabetes medicines (pills) the morning of surgery.  .       You may not have any metal on your body including  piercings  Do not wear jewelry, lotions, powders or  deodorant              Men may shave face and neck.   Do not bring valuables to the hospital. [place].  Contacts, dentures or bridgework may not be worn into surgery.       Patients discharged the day of surgery will not be allowed to drive home  . IF YOU ARE HAVING SURGERY AND GOING HOME THE SAME DAY, YOU MUST HAVE AN ADULT TO DRIVE YOU HOME AND BE WITH YOU FOR 24 HOURS.   YOU MAY GO HOME BY TAXI OR UBER OR ORTHERWISE, BUT AN ADULT MUST ACCOMPANY YOU HOME AND STAY WITH YOU FOR 24 HOURS.  Name and phone number of your driver:  Special Instructions: N/A              Please read over the following fact sheets you were  given: _____________________________________________________________________             [facility] - Preparing for Surgery Before surgery, you can play an important role.   Because skin is not sterile, your skin needs to be as free of germs as possible.   You can reduce the number of germs on your skin by washing with CHG (chlorahexidine gluconate) soap before surgery.   CHG is an antiseptic cleaner which kills germs and bonds with the skin to continue killing germs even after washing. Please DO NOT use if you have an allergy to CHG or antibacterial soaps.   If your skin becomes reddened/irritated stop using the CHG and inform your nurse when you arrive at Short Stay. .  You may shave your face/neck.  Please follow these instructions carefully:  1.  Shower with CHG Soap the night before surgery and the  morning of Surgery.  2.  If you choose to wash your hair, wash your hair first as usual with your  normal  shampoo.  3.  After you shampoo, rinse your hair and body thoroughly to remove the  shampoo.                                        4.  Use CHG as you would any other liquid soap.  You can apply chg directly  to the skin and wash                       Gently with a scrungie or clean washcloth.  5.  Apply the CHG Soap to your body ONLY FROM THE NECK DOWN.   Do not use on face/ open                           Wound or open sores. Avoid contact with eyes, ears mouth and genitals (private parts).                       Wash face,  Genitals (private parts) with your normal soap.             6.  Wash thoroughly, paying special attention to the area where your surgery  will be performed.  7.  Thoroughly rinse your body with warm water from the  neck down.  8.  DO NOT shower/wash with your normal soap after using and rinsing off  the CHG Soap.             9.  Pat yourself dry with a clean towel.            10.  Wear clean pajamas.            11.  Place clean sheets on your bed the night of  your first shower and do not  sleep with pets. Day of Surgery : Do not apply any lotions/deodorants the morning of surgery.  Please wear clean clothes to the hospital/surgery center.  FAILURE TO FOLLOW THESE INSTRUCTIONS MAY RESULT IN THE CANCELLATION OF YOUR SURGERY PATIENT SIGNATURE_________________________________  NURSE SIGNATURE__________________________________  ________________________________________________________________________

## 2019-10-20 ENCOUNTER — Encounter (HOSPITAL_COMMUNITY): Payer: Self-pay

## 2019-10-20 ENCOUNTER — Inpatient Hospital Stay (HOSPITAL_COMMUNITY)
Admission: RE | Admit: 2019-10-20 | Discharge: 2019-10-20 | Disposition: A | Discharge status: Home / Self Care | Payer: Federal, State, Local not specified - PPO | Source: Ambulatory Visit

## 2019-10-20 ENCOUNTER — Other Ambulatory Visit: Payer: Self-pay

## 2019-10-20 NOTE — Progress Notes (Signed)
PCP - Dr. Roselle Locus Cardiologist - no  Chest x-ray - 07/13/19 EKG - 06/05/19 Stress Test -  ECHO - 2014 Cardiac Cath - no  Sleep Study - no CPAP -   Fasting Blood Sugar - wife is care giver and doesn't know Checks Blood Sugar _____ times a day. Once every 2 weeks  Blood Thinner Instructions:NA Aspirin Instructions: Last Dose:  Anesthesia review:   Patient denies shortness of breath, fever, cough and chest pain at PAT appointment yes  Patient verbalized understanding of instructions that were given to them at the PAT appointment. Patient was also instructed that they will need to review over the PAT instructions again at home before surgery. Yes PAT phone interview was with wife.

## 2019-10-23 ENCOUNTER — Other Ambulatory Visit: Payer: Self-pay

## 2019-10-23 ENCOUNTER — Encounter (HOSPITAL_COMMUNITY)
Admission: RE | Admit: 2019-10-23 | Discharge: 2019-10-23 | Disposition: A | Discharge status: Home / Self Care | Payer: Medicare Other | Source: Ambulatory Visit | Attending: Urology | Admitting: Urology

## 2019-10-23 DIAGNOSIS — Z01812 Encounter for preprocedural laboratory examination: Secondary | ICD-10-CM | POA: Insufficient documentation

## 2019-10-23 LAB — BASIC METABOLIC PANEL
Anion gap: 13 (ref 5–15)
BUN: 36 mg/dL — ABNORMAL HIGH (ref 8–23)
CO2: 26 mmol/L (ref 22–32)
Calcium: 9.4 mg/dL (ref 8.9–10.3)
Chloride: 102 mmol/L (ref 98–111)
Creatinine, Ser: 1.28 mg/dL — ABNORMAL HIGH (ref 0.61–1.24)
GFR calc Af Amer: >60 mL/min (ref >60)
GFR calc non Af Amer: 54 mL/min — ABNORMAL LOW (ref >60)
Glucose, Bld: 205 mg/dL — ABNORMAL HIGH (ref 70–99)
Potassium: 4.5 mmol/L (ref 3.5–5.1)
Sodium: 141 mmol/L (ref 135–145)

## 2019-10-23 LAB — CBC
HCT: 33.8 % — ABNORMAL LOW (ref 39.0–52.0)
Hemoglobin: 10.9 g/dL — ABNORMAL LOW (ref 13.0–17.0)
MCH: 27.5 pg (ref 26.0–34.0)
MCHC: 32.2 g/dL (ref 30.0–36.0)
MCV: 85.1 fL (ref 80.0–100.0)
Platelets: 229 10*3/uL (ref 150–400)
RBC: 3.97 MIL/uL — ABNORMAL LOW (ref 4.22–5.81)
RDW: 14.3 % (ref 11.5–15.5)
WBC: 9.6 10*3/uL (ref 4.0–10.5)
nRBC: 0 % (ref 0.0–0.2)

## 2019-10-25 DIAGNOSIS — I10 Essential (primary) hypertension: Secondary | ICD-10-CM | POA: Diagnosis not present

## 2019-10-25 DIAGNOSIS — E118 Type 2 diabetes mellitus with unspecified complications: Secondary | ICD-10-CM | POA: Diagnosis not present

## 2019-10-25 DIAGNOSIS — E782 Mixed hyperlipidemia: Secondary | ICD-10-CM | POA: Diagnosis not present

## 2019-10-25 DIAGNOSIS — F0390 Unspecified dementia without behavioral disturbance: Secondary | ICD-10-CM | POA: Diagnosis not present

## 2019-10-26 ENCOUNTER — Other Ambulatory Visit (HOSPITAL_COMMUNITY)
Admission: RE | Admit: 2019-10-26 | Discharge: 2019-10-26 | Disposition: A | Discharge status: Home / Self Care | Payer: Medicare Other | Source: Ambulatory Visit | Attending: Urology | Admitting: Urology

## 2019-10-26 DIAGNOSIS — Z20822 Contact with and (suspected) exposure to covid-19: Secondary | ICD-10-CM | POA: Diagnosis not present

## 2019-10-26 DIAGNOSIS — Z01812 Encounter for preprocedural laboratory examination: Secondary | ICD-10-CM | POA: Insufficient documentation

## 2019-10-26 DIAGNOSIS — D485 Neoplasm of uncertain behavior of skin: Secondary | ICD-10-CM | POA: Diagnosis not present

## 2019-10-26 DIAGNOSIS — C4442 Squamous cell carcinoma of skin of scalp and neck: Secondary | ICD-10-CM | POA: Diagnosis not present

## 2019-10-26 DIAGNOSIS — Z85828 Personal history of other malignant neoplasm of skin: Secondary | ICD-10-CM | POA: Diagnosis not present

## 2019-10-26 LAB — SARS CORONAVIRUS 2 (TAT 6-24 HRS): SARS Coronavirus 2: NEGATIVE

## 2019-10-26 NOTE — Progress Notes (Signed)
[name] [name] presents today for his 2 week follow up after completing radiation to the left parotid on 10/09/2019  Pain issues, if any: Patient denies; per wife: patient often places his hands on the side of his face/jaw like he's in discomfort, but when she asks him if he's ok he simply says his skin is itchy. Using a feeding tube?: N/A Weight changes, if any:  Wt Readings from Last 3 Encounters:  10/27/19 152 lb 4 oz (69.1 kg)  10/23/19 154 lb 8 oz (70.1 kg)  08/01/19 163 lb 4 oz (74 kg)   Swallowing issues, if any: Per wife: patient doesn't complain or exhibit any signs, but patient does eat very slowly and doesn't seem very interested in eating Smoking or chewing tobacco? None Using fluoride trays daily? Used sporadically  Last ENT visit was on: Not since 08/15/2019 with Dr. Francina Ames; no other follow-ups scheduled. Other notable issues, if any: Frequently coughs/clears throat, but wife states symptoms seem to be improving. Wife continues to apply Sonafine cream to treatment field, and notes that skin looks like it is slowly getting back to normal  Vitals:   10/27/19 1109  BP: 121/63  Pulse: 91  Resp: 18  Temp: 98 F (36.7 C)  SpO2: 100%

## 2019-10-27 ENCOUNTER — Other Ambulatory Visit: Payer: Self-pay

## 2019-10-27 ENCOUNTER — Ambulatory Visit
Admission: RE | Admit: 2019-10-27 | Discharge: 2019-10-27 | Disposition: A | Discharge status: Home / Self Care | Payer: Medicare Other | Source: Ambulatory Visit | Attending: Radiation Oncology | Admitting: Radiation Oncology

## 2019-10-27 ENCOUNTER — Encounter: Payer: Self-pay | Admitting: Radiation Oncology

## 2019-10-27 VITALS — BP 121/63 | HR 91 | Temp 98.0°F | Resp 18 | Ht 69.0 in | Wt 152.2 lb

## 2019-10-27 DIAGNOSIS — Z79899 Other long term (current) drug therapy: Secondary | ICD-10-CM | POA: Insufficient documentation

## 2019-10-27 DIAGNOSIS — R630 Anorexia: Secondary | ICD-10-CM | POA: Diagnosis not present

## 2019-10-27 DIAGNOSIS — Z7984 Long term (current) use of oral hypoglycemic drugs: Secondary | ICD-10-CM | POA: Insufficient documentation

## 2019-10-27 DIAGNOSIS — C07 Malignant neoplasm of parotid gland: Secondary | ICD-10-CM | POA: Diagnosis not present

## 2019-10-27 DIAGNOSIS — R634 Abnormal weight loss: Secondary | ICD-10-CM | POA: Insufficient documentation

## 2019-10-27 DIAGNOSIS — C4440 Unspecified malignant neoplasm of skin of scalp and neck: Secondary | ICD-10-CM

## 2019-10-27 NOTE — Progress Notes (Signed)
Oncology Nurse Navigator Documentation  At Dr. Pearlie Oyster request, I called Dr. Servando Salina office to set up a an appointment for Mr. Space in 3 weeks. I spoke with scheduling and he is scheduled for 11/15/19 at 2:00. I called and spoke to Mr. Treiber wife and she is agreeable to the appointment. She knows to call me with any further concerns/questions.  Harlow Asa RN, BSN, OCN Head & Neck Oncology Nurse [place] at [facility] Phone # [phone]  Fax # [phone]

## 2019-10-27 NOTE — H&P (Signed)
Office Visit Report     10/10/2019   --------------------------------------------------------------------------------   [name] [name]  [id number]  DOB: Jun 02, 1941, 79 year old Male  SSN: [id number]   PRIMARY CARE:  [name] Gareth Eagle, MD  REFERRING:  [name] Creek, MD  PROVIDER:  Raynelle [name], M.D.  LOCATION:  [facility] [phone]     --------------------------------------------------------------------------------   CC/HPI: 1. Urothelial carcinoma of the bladder  2. Right ureteral obstruction   [name] [name] follows up today for continued evaluation of his right ureteral obstruction and history of bladder cancer. He is scheduled to be seen by [name] [name] this summer for his next staging studies. He has had no signs of local recurrence on recent cystoscopies. He most recently underwent cystoscopy in February when he underwent his ureteral stent change on 08/07/2019. 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 He just completed his radiation therapy yesterday and has tolerated this relatively well. He currently denies any hematuria. He does have stable urgency and frequency of urination.     ALLERGIES: Aricept Nitrofurantoin Monohyd Macro CAPS Sulfa Drugs Tetracyclines    MEDICATIONS: Metoprolol Succinate 25 mg tablet, extended release 24 hr  Avodart 0.5 mg capsule 0 Oral  Fish Oil CAPS Oral  Glipizide  Hydrochlorothiazide 25 mg tablet Oral  MetFORMIN HCl - 500 MG Oral Tablet Oral  Namenda Xr 28 mg capsule sprinkle, extended release 24 hr Oral  Onglyza 5 mg tablet Oral  Perindopril Erbumine 4 MG Oral Tablet Oral  Trospium Chloride 20 mg tablet 1 tablet PO BID  Vitamin B-12 100 mcg tablet Oral     GU PSH: Bladder Instill AntiCA Agent - 2016 Catheterize For Residual - 03/23/2018 Cysto Bladder Ureth Biopsy - 2018 Cysto Uretero Lithotripsy - 2010 Cystoscopy - 08/10/2018, 03/23/2018, 2019, 05/21/2017,  2018, 2018 Cystoscopy Fulguration - 2010 Cystoscopy Insert Stent, Right - 08/07/2019, Right - 05/08/2019, Right - 01/26/2019, Right - 10/17/2018, Right - 08/29/2018, 2015, 2010, 2010, 2008 Cystoscopy TURBT <2 cm - 08/29/2018, 11/11/2017, 2018, 2017, 2015, 2013 Cystoscopy TURBT 2-5 cm - 10/17/2018, 2017, 2016, 2016 Cystoscopy Ureteroscopy, Right - 10/17/2018, Right - 08/29/2018, Left - 2018, 2015 Locm 300-399Mg /Ml Iodine,1Ml - 09/21/2018 Percut Stone Removal >2cm - 2015 Ureteroscopic stone removal - 2008       PSH Notes: Cystoscopy With Fulguration Medium Lesion (2-5cm), Cystoscopy With Fulguration Medium Lesion (2-5cm), Bladder Injection Of Cancer Treatment, Percutaneous Lithotomy For Stone Over 2cm., Cystoscopy With Insertion Of Ureteral Stent Left, Cystoscopy With Ureteroscopy Left, Cystoscopy With Fulguration Small Lesion (5-25mm), Cystoscopy With Fulguration Small Lesion (5-45mm), Cystoscopy With Pyeloscopy With Lithotripsy, Cystoscopy With Insertion Of Ureteral Stent Right, Cystoscopy With Fulguration, Cystoscopy With Insertion Of Ureteral Stent Right, Cystoscopy With Ureteroscopy With Removal Of Calculus, Cystoscopy With Insertion Of Ureteral Stent Left   NON-GU PSH: No Non-GU PSH    GU PMH: Urinary Urgency (Stable) - 07/18/2019, Urinary urgency, - 2016 Ureteral obstruction - 09/20/2018 Urinary Frequency - 09/20/2018 Bladder Cancer overlapping sites - 2017, Malignant neoplasm of overlapping sites of bladder, - 2017 BPH [name]/LUTS, Benign prostatic hyperplasia (BPH) with straining on urination - 2016 Renal calculus, Nephrolithiasis - 2016      PMH Notes:   [name] [name] is a patient previously followed by [name] [name]. He has the following urologic history:    1) Bladder cancer: He was initially diagnosed with a low-grade Ta urothelial carcinoma of the bladder in 2007 and was treated with a  TURBT. He was noted to have a recurrence in 2013 and underwent TURBT in July 2013. He was also incidentally noted to  have a stricture of the distal left ureter on RPG studies at that time and a brush biopsy was performed. He was found to have high grade Ta urothelial carcinoma in February 2016.    Oct 2007: TURBT, Low grade Ta  Jul 2013: TURBT (right bladder neck 1.5 cm tumor, low grade Ta),brush biopsy of distal left ureteral stricture (no malignancy)  Mar 2015: TURBT - Low grade Ta  Feb 2016: TURBT - High grade Ta, [facility]  Mar-Apr 2016: Induction BCG  Jul 2016: Maintenance BCG  Oct 2016: Multiple recurrent tumors noted on surveillance cystoscopy  Dec 2016: TUR - Multiple tumors over right posterior bladder - High grade, Ta  Dec 2016 - Feb 2017: 6 week induction BCG  May 2017: TURBT - High grade, Ta urothelial carcinoma (two tumors)  Oct 2017: Completed induction 6 week course of intravesical gemcitabine [facility] - [name] [name])  Apr 2018: Two small recurrences (each < 1 cm)  Apr 2018: TURBT- High grade Ta, concern for possible lamina propria invasion for tumor at dome  May 2018: Repeat TURBT - No residual malignancy  Jul-Aug 2018: Repeat induction 6 week course of intravesical gemcitabine [facility]- [name] [name])  Sep 2018 - Apr 2019: Monthly maintenance gemcitabine  May 2019: Blue light cystoscopy with TURBT for 2 recurrent tumors posteriorly - High grade, Ta  Jun-Jul 2019: Repeat induction intravesical gemcitabine  Mar 2020: TURBT for two recurrent tumors - High grade, muscle invasive urothelial carcinoma with invasion into distal right ureter, right ureteral stent  Apr 2020: Patient and wife elected trimodality therapy (refused cystectomy due to severe dementia), repeat completion TURBT and right ureteral stent change  May-Jun 2020: XRT and carboplatin    2) Nephrolithiasis: He has a long standing history of calcium oxalate stones. He was incidentally noted to have a large partial staghorn calculus during retrograde pyelography in February 2015.  Apr 2015: L PCNL   3) Prostate cancer screening: We discontinued  screening in 2014 due to his low risk for prostate cancer at that time and progressive dementia.  Last PSA: 0.35 (June 2014)   4) BPH/LUTS: He is treated with Avodart.   5) Right ureteral obstruction: This was noted in March 2020 when he developed hydronephrosis due to progression of bladder cancer and development of muscle invasive disease. He began management with right ureteral stent in March 2020:   Mar 2020: R ureteral stent placement (encrusted within 6 weeks  Apr 2020: R ureteral stent change (6 x 26 [employer])  Aug 2020: R ureteral stent change (6 x 26 [employer])  Nov 2020: R ureteral stent change (6 x 26 [employer])  Feb 2020: R ureteral stent change (6 x 26 [employer])   NON-GU PMH: Bacteriuria (Stable) - 12/14/2018, (Stable), - 09/20/2018 (Stable), - 09/02/2018 (Stable), - 2018 (Stable), - 2018, - 2018 Diabetes Type 2 Hypertension Unspecified dementia without behavioral disturbance    FAMILY HISTORY: No pertinent family history - Other Prostate Cancer - No Family History   SOCIAL HISTORY: Marital Status: Married Preferred Language: English; Ethnicity: Not Hispanic Or Latino; Race: White Current Smoking Status: Patient has never smoked.  Has never drank.  Drinks 2 caffeinated drinks per day. Has not had a blood transfusion.     Notes: Never A Smoker   REVIEW OF SYSTEMS:    GU Review Male:  Patient reports get up at night to urinate. Patient denies frequent urination, hard to postpone urination, burning/ pain with urination, leakage of urine, stream starts and stops, trouble starting your streams, and have to strain to urinate .  Gastrointestinal (Upper):   Patient denies nausea and vomiting.  Gastrointestinal (Lower):   Patient reports diarrhea. Patient denies constipation.  Constitutional:   Patient denies fever, night sweats, weight loss, and fatigue.  Skin:   Patient denies skin rash/ lesion and itching.  Eyes:   Patient denies blurred  vision and double vision.  Ears/ Nose/ Throat:   Patient denies sore throat and sinus problems.  Hematologic/Lymphatic:   Patient denies swollen glands and easy bruising.  Cardiovascular:   Patient denies leg swelling and chest pains.  Respiratory:   Patient denies cough and shortness of breath.  Endocrine:   Patient denies excessive thirst.  Musculoskeletal:   Patient denies back pain and joint pain.  Neurological:   Patient denies headaches and dizziness.  Psychologic:   Patient denies depression and anxiety.   VITAL SIGNS:      10/10/2019 08:58 AM  Weight 159 lb / 72.12 kg  Height 69 in / 175.26 cm  BP 127/65 mmHg  Pulse 93 /min  Temperature 97.5 F / 36.3 C  BMI 23.5 kg/m   MULTI-SYSTEM PHYSICAL EXAMINATION:    Constitutional: Well-nourished. No physical deformities. Normally developed. Good grooming.  Respiratory: No labored breathing, no use of accessory muscles. Clear bilaterally.  Cardiovascular: Normal temperature, normal extremity pulses, no swelling, no varicosities. Regular rate and rhythm.     Complexity of Data:  Records Review:   Previous Patient Records  Urine Test Review:   Urinalysis   11/23/12 11/18/11 11/28/10 01/08/10 12/20/03  PSA  Total PSA 0.35  0.42  0.46  0.49  2.42     PROCEDURES:          Urinalysis [name]/Scope Dipstick Dipstick Cont'd Micro  Color: Yellow Bilirubin: Neg mg/dL WBC/hpf: >60/hpf  Appearance: Slightly Cloudy Ketones: Neg mg/dL RBC/hpf: 3 - 10/hpf  Specific Gravity: 1.015 Blood: 1+ ery/uL Bacteria: Rare (0-9/hpf)  pH: 8.0 Protein: 2+ mg/dL Cystals: NS (Not Seen)  Glucose: Neg mg/dL Urobilinogen: 0.2 mg/dL Casts: NS (Not Seen)    Nitrites: Neg Trichomonas: Not Present    Leukocyte Esterase: 3+ leu/uL Mucous: Not Present      Epithelial Cells: NS (Not Seen)      Yeast: NS (Not Seen)      Sperm: Not Present    ASSESSMENT:      ICD-10 Details  1 GU:   Ureteral obstruction - N13.1   2   Bladder Cancer overlapping sites - C67.8     PLAN:           Orders Labs Urine Culture          Schedule Return Visit/Planned Activity: Other See Visit Notes             Note: Will call to schedule surgery.          Document Letter(s):  Created for Patient: Clinical Summary         Notes:   1. Right ureteral obstruction: We will plan to proceed with cystoscopy and right ureteral stent change at the end of May. This would be an appropriate time considering his propensity for early incrustation of his stents. Furthermore, his wife is planning on having prolapse surgery in early June so this time and will work out well for them. We have reviewed this  procedure in detail today. Urine culture has been obtained. He will be treated preoperatively with antibiotic therapy if indicated.   2. Bladder cancer: His bladder will be re-evaluated his upcoming procedure. He otherwise will keep his appointment with [name] [name] this summer. I have told Ms Lubin to touch base with the nurse navigator for the head and neck program and see if they can discuss what imaging may be necessary per Dr. should I have considering his recent events.   Cc: Dr. [name]  Dr. Zola Button    * Signed by [name] [name], M.D. on 10/10/19 at 12:03 PM (EDT)*

## 2019-10-27 NOTE — Progress Notes (Signed)
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 Cancer of skin of neck  C44.40     CHIEF COMPLAINT: Here for follow-up and surveillance of left parotid cancer  Narrative:  The patient returns today for routine follow-up.  His wife feels that he is doing better overall.  The main issue is that he continues to eat less.  He is drinking at least 1 Ensure per day and eating very small meals.  She tries to give him peanut butter as snacks.  Because he is not communicating she is not sure why he is eating less. She is applying Sonafine to his skin                              Next week he undergoes cystoscopy with stent revision  ALLERGIES:  is allergic to sulfa antibiotics; tetracyclines & related; and aricept [donepezil hcl].  Meds: Current Outpatient Medications  Medication Sig Dispense Refill  . dutasteride (AVODART) 0.5 MG capsule Take 0.5 mg by mouth daily.     . hydrochlorothiazide (HYDRODIURIL) 25 MG tablet Take 25 mg by mouth daily.     . memantine (NAMENDA XR) 28 MG CP24 24 hr capsule Take 28 mg by mouth every morning.     . metFORMIN (GLUCOPHAGE-XR) 750 MG 24 hr tablet Take 750 mg by mouth daily.    . metoprolol succinate (TOPROL-XL) 25 MG 24 hr tablet Take 25 mg by mouth daily.     . Multiple Vitamin (MULTIVITAMIN WITH MINERALS) TABS tablet Take 1 tablet by mouth every evening.    . Omega-3 Fatty Acids (FISH OIL) 1200 MG CAPS Take 1,200 mg by mouth every evening.    [name] DELICA LANCETS [id number] MISC 2 (two) times daily. as directed  4  . perindopril (ACEON) 4 MG tablet Take 4 mg by mouth daily.     . sitaGLIPtin (JANUVIA) 100 MG tablet Take 100 mg by mouth daily.    . vitamin B-12  (CYANOCOBALAMIN) 500 MCG tablet Take 500 mcg by mouth every evening.    [name] glipiZIDE (GLUCOTROL) 5 MG tablet Take 2.5 mg by mouth daily before breakfast.      No current facility-administered medications for this encounter.    Physical Findings: The patient is in no acute distress height is 5\' 9"  (1.753 m) and weight is 152 lb 4 oz (69.1 kg). His temporal temperature is 98 F (36.7 C). His blood pressure is 121/63 and his pulse is 91. His respiration is 18 and oxygen saturation is 100%. .     Wt Readings from Last 3 Encounters:  10/27/19 152 lb 4 oz (69.1 kg)  10/23/19 154 lb 8 oz (70.1 kg)  08/01/19 163 lb 4 oz (74 kg)    He presents in a wheelchair; he is nonverbal  Skin is pink over the left neck and overall healing well; there is persistent desquamation along the pinna (where he underwent previous surgery and continues to scratch himself)  and the site of skin graft at left neck. Dry flaking of skin over face and neck and persistent evidence of chronic sun damage. There is  a new scar over his scalp where he underwent excision of lesion recently by dermatology. Oral cavity is clear and moist without thrush   Lab Findings: Lab Results  Component Value Date   WBC 9.6 10/23/2019   HGB 10.9 (L) 10/23/2019   HCT 33.8 (L) 10/23/2019   MCV 85.1 10/23/2019   PLT 229 10/23/2019    Radiographic Findings: No results found.  Impression/Plan: He is recovering from radiotherapy.  He is still losing weight (see vitals above). I suspect he has dysgeusia from the radiation therapy.  We took special care to minimize radiation exposure to his tongue, but as I had counseled the patient and his wife, even very low doses of scatter radiation can cause dysgeusia.  Luckily, he is eating small meals and drinking some Ensure and I spoke at length with the patient's wife about how to increase his Ensure intake and how to boost the calories in his food.  She will try to cook his grits in milk and add  butter to his meals when cooking.  She will offer him more Ensure throughout the day.  He likes almonds and she will try giving him almond butter since his taste for peanut butter has decreased  I will refer him back to nutrition.  We will refer him back to otolaryngology.  I will not order any follow-up imaging based on his overall medical status -unless otolaryngology disagrees, I believe physical exams are appropriate for follow-up with imaging only as clinically indicated.  I will see him back in 6 months, sooner if needed.    On date of service, in total, I spent 30 minutes on this encounter.  Patient was seen in person.     Eppie Gibson, MD

## 2019-10-29 NOTE — Anesthesia Preprocedure Evaluation (Addendum)
Anesthesia Evaluation  Patient identified by MRN, date of birth, ID band Patient awake  General Assessment Comment:Pt had almonds at 0830 will  Go after 1430  Reviewed: Allergy & Precautions, NPO status , Patient's Chart, lab work & pertinent test results  Airway Mallampati: II  TM Distance: >3 FB Neck ROM: Full    Dental no notable dental hx. (+) Teeth Intact, Dental Advisory Given   Pulmonary neg pulmonary ROS,    Pulmonary exam normal breath sounds clear to auscultation       Cardiovascular Exercise Tolerance: Good hypertension, negative cardio ROS Normal cardiovascular exam Rhythm:Regular Rate:Normal     Neuro/Psych  Neuromuscular disease negative psych ROS   GI/Hepatic negative GI ROS, Neg liver ROS,   Endo/Other  diabetes  Renal/GU      Musculoskeletal negative musculoskeletal ROS (+)   Abdominal   Peds negative pediatric ROS (+)  Hematology negative hematology ROS (+)   Anesthesia Other Findings   Reproductive/Obstetrics                           Anesthesia Physical Anesthesia Plan  ASA: III  Anesthesia Plan: General   Post-op Pain Management:    Induction: Intravenous  PONV Risk Score and Plan: 3 and Treatment may vary due to age or medical condition, Ondansetron and Dexamethasone  Airway Management Planned: LMA  Additional Equipment: None  Intra-op Plan:   Post-operative Plan:   Informed Consent: I have reviewed the patients History and Physical, chart, labs and discussed the procedure including the risks, benefits and alternatives for the proposed anesthesia with the patient or authorized representative who has indicated his/her understanding and acceptance.     Dental advisory given  Plan Discussed with: CRNA and Anesthesiologist  Anesthesia Plan Comments:        Anesthesia Quick Evaluation

## 2019-10-30 ENCOUNTER — Ambulatory Visit (HOSPITAL_COMMUNITY): Payer: Medicare Other | Admitting: Anesthesiology

## 2019-10-30 ENCOUNTER — Encounter (HOSPITAL_COMMUNITY): Payer: Self-pay | Admitting: Urology

## 2019-10-30 ENCOUNTER — Ambulatory Visit (HOSPITAL_COMMUNITY)
Admission: RE | Admit: 2019-10-30 | Discharge: 2019-10-30 | Disposition: A | Discharge status: Home / Self Care | Payer: Medicare Other | Attending: Urology | Admitting: Urology

## 2019-10-30 ENCOUNTER — Ambulatory Visit (HOSPITAL_COMMUNITY): Payer: Medicare Other

## 2019-10-30 ENCOUNTER — Encounter (HOSPITAL_COMMUNITY)
Admission: RE | Disposition: A | Discharge status: Home / Self Care | Payer: Self-pay | Source: Home / Self Care | Attending: Urology

## 2019-10-30 ENCOUNTER — Ambulatory Visit (HOSPITAL_COMMUNITY): Payer: Medicare Other | Admitting: Physician Assistant

## 2019-10-30 DIAGNOSIS — Z7984 Long term (current) use of oral hypoglycemic drugs: Secondary | ICD-10-CM | POA: Insufficient documentation

## 2019-10-30 DIAGNOSIS — N135 Crossing vessel and stricture of ureter without hydronephrosis: Secondary | ICD-10-CM | POA: Diagnosis not present

## 2019-10-30 DIAGNOSIS — Z79899 Other long term (current) drug therapy: Secondary | ICD-10-CM | POA: Insufficient documentation

## 2019-10-30 DIAGNOSIS — N131 Hydronephrosis with ureteral stricture, not elsewhere classified: Secondary | ICD-10-CM | POA: Diagnosis not present

## 2019-10-30 DIAGNOSIS — Z87442 Personal history of urinary calculi: Secondary | ICD-10-CM | POA: Diagnosis not present

## 2019-10-30 DIAGNOSIS — I10 Essential (primary) hypertension: Secondary | ICD-10-CM | POA: Insufficient documentation

## 2019-10-30 DIAGNOSIS — E119 Type 2 diabetes mellitus without complications: Secondary | ICD-10-CM | POA: Diagnosis not present

## 2019-10-30 DIAGNOSIS — Z8551 Personal history of malignant neoplasm of bladder: Secondary | ICD-10-CM | POA: Insufficient documentation

## 2019-10-30 HISTORY — PX: CYSTOSCOPY WITH STENT PLACEMENT: SHX5790

## 2019-10-30 LAB — GLUCOSE, CAPILLARY: Glucose-Capillary: 182 mg/dL — ABNORMAL HIGH (ref 70–99)

## 2019-10-30 SURGERY — CYSTOSCOPY, WITH STENT INSERTION
Anesthesia: General | Laterality: Right

## 2019-10-30 MED ORDER — PROPOFOL 10 MG/ML IV BOLUS
INTRAVENOUS | Status: DC | PRN
  Administered 2019-10-30: 80 mg via INTRAVENOUS

## 2019-10-30 MED ORDER — STERILE WATER FOR IRRIGATION IR SOLN
Status: DC | PRN
  Administered 2019-10-30: 3000 mL

## 2019-10-30 MED ORDER — PHENYLEPHRINE 40 MCG/ML (10ML) SYRINGE FOR IV PUSH (FOR BLOOD PRESSURE SUPPORT)
PREFILLED_SYRINGE | INTRAVENOUS | Status: DC | PRN
  Administered 2019-10-30: 100 ug via INTRAVENOUS
  Administered 2019-10-30: 40 ug via INTRAVENOUS

## 2019-10-30 MED ORDER — LIDOCAINE HCL (CARDIAC) PF 100 MG/5ML IV SOSY
PREFILLED_SYRINGE | INTRAVENOUS | Status: DC | PRN
  Administered 2019-10-30: 80 mg via INTRAVENOUS

## 2019-10-30 MED ORDER — FENTANYL CITRATE (PF) 100 MCG/2ML IJ SOLN
INTRAMUSCULAR | Status: AC
  Filled 2019-10-30: qty 2

## 2019-10-30 MED ORDER — PHENYLEPHRINE 40 MCG/ML (10ML) SYRINGE FOR IV PUSH (FOR BLOOD PRESSURE SUPPORT)
PREFILLED_SYRINGE | INTRAVENOUS | Status: AC
  Filled 2019-10-30: qty 10

## 2019-10-30 MED ORDER — ORAL CARE MOUTH RINSE: 15.0000 mL | Freq: Once | OROMUCOSAL | Status: AC

## 2019-10-30 MED ORDER — FENTANYL CITRATE (PF) 100 MCG/2ML IJ SOLN
INTRAMUSCULAR | Status: DC | PRN
  Administered 2019-10-30 (×2): 25 ug via INTRAVENOUS

## 2019-10-30 MED ORDER — LACTATED RINGERS IV SOLN: INTRAVENOUS | Status: DC

## 2019-10-30 MED ORDER — CHLORHEXIDINE GLUCONATE 0.12 % MT SOLN
15.0000 mL | Freq: Once | OROMUCOSAL | Status: AC
  Administered 2019-10-30: 15 mL via OROMUCOSAL

## 2019-10-30 MED ORDER — FENTANYL CITRATE (PF) 100 MCG/2ML IJ SOLN: 25.0000 ug | INTRAMUSCULAR | Status: DC | PRN

## 2019-10-30 MED ORDER — ONDANSETRON HCL 4 MG/2ML IJ SOLN
INTRAMUSCULAR | Status: DC | PRN
  Administered 2019-10-30: 4 mg via INTRAVENOUS

## 2019-10-30 MED ORDER — SODIUM CHLORIDE 0.9 % IV SOLN
2.0000 g | INTRAVENOUS | Status: AC
  Administered 2019-10-30: 2 g via INTRAVENOUS
  Filled 2019-10-30: qty 20

## 2019-10-30 MED ORDER — DEXAMETHASONE SODIUM PHOSPHATE 10 MG/ML IJ SOLN
INTRAMUSCULAR | Status: DC | PRN
  Administered 2019-10-30: 6 mg via INTRAVENOUS

## 2019-10-30 SURGICAL SUPPLY — 13 items
BAG URO CATCHER STRL LF (MISCELLANEOUS) ×3 IMPLANT
CATH INTERMIT  6FR 70CM (CATHETERS) IMPLANT
CLOTH BEACON ORANGE TIMEOUT ST (SAFETY) ×3 IMPLANT
GLOVE BIOGEL M STRL SZ7.5 (GLOVE) ×3 IMPLANT
GOWN STRL REUS W/TWL LRG LVL3 (GOWN DISPOSABLE) ×6 IMPLANT
GUIDEWIRE STR DUAL SENSOR (WIRE) ×3 IMPLANT
KIT TURNOVER KIT A (KITS) IMPLANT
MANIFOLD NEPTUNE II (INSTRUMENTS) ×3 IMPLANT
STENT URO INLAY 6FRX26CM (STENTS) ×3 IMPLANT
TRAY CYSTO PACK (CUSTOM PROCEDURE TRAY) ×3 IMPLANT
TUBING CONNECTING 10 (TUBING) ×2 IMPLANT
TUBING CONNECTING 10' (TUBING) ×1
TUBING UROLOGY SET (TUBING) IMPLANT

## 2019-10-30 NOTE — Op Note (Signed)
Preoperative diagnosis:  1. Right ureteral obstruction   Postoperative diagnosis:  1. Right ureteral obstruction   Procedure:  1. Cystoscopy 2. Righ ureteral stent placement (6 x 26 Bard Inlay Optima - no string)  Surgeon: Roxy Horseman, Brooke Bonito. M.D.  Anesthesia: General  Complications: None  Intraoperative findings: His indwelling stent was only mildly encrusted.  EBL: Minimal  Specimens: None  Indication: [name] [name] is a 79 y.o. patient with right ureteral obstruction. After reviewing the management options for treatment, he elected to proceed with the above surgical procedure(s). We have discussed the potential benefits and risks of the procedure, side effects of the proposed treatment, the likelihood of the patient achieving the goals of the procedure, and any potential problems that might occur during the procedure or recuperation. Informed consent has been obtained.  Description of procedure:  The patient was taken to the operating room and general anesthesia was induced.  The patient was placed in the dorsal lithotomy position, prepped and draped in the usual sterile fashion, and preoperative antibiotics were administered. A preoperative time-out was performed.   Cystourethroscopy was performed.  The patient's urethra was examined and was unremarkable. The bladder was then systematically examined in its entirety. There was no evidence for any bladder tumors, stones, or other mucosal pathology.    Attention then turned to the right ureteral orifice and the patient's indwelling ureteral stent was identified and brought out to the urethral meatus with the flexible graspers.  A 0.38 sensor guidewire was then advanced up the right ureter into the renal pelvis under fluoroscopic guidance.  The wire was then backloaded through the cystoscope and a ureteral stent was advance over the wire using Seldinger technique.  The stent was positioned appropriately under fluoroscopic and  cystoscopic guidance.  The wire was then removed with an adequate stent curl noted in the renal pelvis as well as in the bladder.  The bladder was then emptied and the procedure ended.  The patient appeared to tolerate the procedure well and without complications.  The patient was able to be awakened and transferred to the recovery unit in satisfactory condition.    Pryor Curia MD

## 2019-10-30 NOTE — Anesthesia Postprocedure Evaluation (Signed)
Anesthesia Post Note  Patient: [name] [name]  Procedure(s) Performed: CYSTOSCOPY WITH STENT CHANGE (Right )     Patient location during evaluation: PACU Anesthesia Type: General Level of consciousness: awake and alert Pain management: pain level controlled Vital Signs Assessment: post-procedure vital signs reviewed and stable Respiratory status: spontaneous breathing, nonlabored ventilation, respiratory function stable and patient connected to nasal cannula oxygen Cardiovascular status: blood pressure returned to baseline and stable Postop Assessment: no apparent nausea or vomiting Anesthetic complications: no    Last Vitals:  Vitals:   10/30/19 1600 10/30/19 1706  BP: 119/78 136/68  Pulse: 78 81  Resp: 20   Temp: 36.9 C 36.8 C  SpO2: 96% 99%    Last Pain:  Vitals:   10/30/19 1706  TempSrc: Oral  PainSc: 0-No pain                 Barnet Glasgow

## 2019-10-30 NOTE — Interval H&P Note (Signed)
History and Physical Interval Note:  10/30/2019 11:59 AM  [name] [name]  has presented today for surgery, with the diagnosis of RIGHT URETERAL OBSTRUCTION.  The various methods of treatment have been discussed with the patient and family. After consideration of risks, benefits and other options for treatment, the patient has consented to  Procedure(s): CYSTOSCOPY WITH STENT CHANGE (Right) as a surgical intervention.  The patient's history has been reviewed, patient examined, no change in status, stable for surgery.  I have reviewed the patient's chart and labs.  Questions were answered to the patient's satisfaction.     [employer]

## 2019-10-30 NOTE — Discharge Instructions (Signed)

## 2019-10-30 NOTE — Transfer of Care (Signed)
Immediate Anesthesia Transfer of Care Note  Patient: [name] [name]  Procedure(s) Performed: CYSTOSCOPY WITH STENT CHANGE (Right )  Patient Location: PACU  Anesthesia Type:General  Level of Consciousness: awake and patient cooperative  Airway & Oxygen Therapy: Patient Spontanous Breathing and Patient connected to face mask oxygen  Post-op Assessment: Report given to RN and Post -op Vital signs reviewed and stable  Post vital signs: Reviewed and stable  Last Vitals:  Vitals Value Taken Time  BP 134/69 10/30/19 1520  Temp    Pulse    Resp 15 10/30/19 1523  SpO2 100   Vitals shown include unvalidated device data.  Last Pain:  Vitals:   10/30/19 1045  TempSrc: Oral         Complications: No apparent anesthesia complications

## 2019-10-30 NOTE — Interval H&P Note (Signed)
History and Physical Interval Note:  10/30/2019 2:24 PM  [name] [name]  has presented today for surgery, with the diagnosis of RIGHT URETERAL OBSTRUCTION.  The various methods of treatment have been discussed with the patient and family. After consideration of risks, benefits and other options for treatment, the patient has consented to  Procedure(s): CYSTOSCOPY WITH STENT CHANGE (Right) as a surgical intervention.  The patient's history has been reviewed, patient examined, no change in status, stable for surgery.  I have reviewed the patient's chart and labs.  Questions were answered to the patient's satisfaction.     Les [employer]

## 2019-10-30 NOTE — Anesthesia Procedure Notes (Signed)
Procedure Name: LMA Insertion Date/Time: 10/30/2019 2:48 PM Performed by: Raenette Rover, CRNA Pre-anesthesia Checklist: Patient identified, Emergency Drugs available, Suction available and Patient being monitored Patient Re-evaluated:Patient Re-evaluated prior to induction Oxygen Delivery Method: Circle system utilized Preoxygenation: Pre-oxygenation with 100% oxygen Induction Type: IV induction LMA: LMA inserted LMA Size: 4.0 Number of attempts: 1 Placement Confirmation: positive ETCO2 and breath sounds checked- equal and bilateral Tube secured with: Tape Dental Injury: Teeth and Oropharynx as per pre-operative assessment

## 2019-11-06 ENCOUNTER — Telehealth: Payer: Self-pay

## 2019-11-07 ENCOUNTER — Inpatient Hospital Stay: Payer: Medicare Other | Attending: Oncology

## 2019-11-07 NOTE — Progress Notes (Signed)
Nutrition Follow-up:  Patient with left parotid cancer.  Patient has completed treatment.    Spoke with wife via phone for nutrition follow-up.  Wife reports that patient is eating a little bit more than before.  Reports that she is still giving portions on child's size plate but patient is finishing food items.  Has been drinking 2-3 ensure daily.  Yesterday was able to drink coffee mixed with ensure, grits with butter.  Lunch was bacon, lettuce tomato sandwich (ate 100%).  Supper last night was squash, peas, potatoes and corn.  Patient does not like much meat, no eggs.  Will eat peanut butter, pimento cheese, cottage cheese, ice cream.      Medications: reviewed  Labs: reviewed  Anthropometrics:   Weight per wife on home scale 156 lb today increased from 152 lb.   NUTRITION DIAGNOSIS: Untentional weight loss  improving   INTERVENTION:  Encouraged continues use of oral nutrition supplements for additional calories and protein. Discussed strategies to increase calories and protein.   Wife has contact information    MONITORING, EVALUATION, GOAL: Patient will tolerate increased calories and protein to minimize weight loss.    NEXT VISIT: June 22 phone f/u  [name] [name], [place], [place] Registered Dietitian [phone] (pager)

## 2019-11-13 NOTE — Progress Notes (Signed)
  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   Plan: The patient will follow-up with radiation oncology in 2 weeks.  -----------------------------------  Eppie Gibson, MD

## 2019-11-15 DIAGNOSIS — Z9889 Other specified postprocedural states: Secondary | ICD-10-CM | POA: Diagnosis not present

## 2019-11-15 DIAGNOSIS — Z8589 Personal history of malignant neoplasm of other organs and systems: Secondary | ICD-10-CM | POA: Diagnosis not present

## 2019-11-15 DIAGNOSIS — Z923 Personal history of irradiation: Secondary | ICD-10-CM | POA: Diagnosis not present

## 2019-11-15 DIAGNOSIS — C7989 Secondary malignant neoplasm of other specified sites: Secondary | ICD-10-CM | POA: Diagnosis not present

## 2019-11-15 DIAGNOSIS — Z08 Encounter for follow-up examination after completed treatment for malignant neoplasm: Secondary | ICD-10-CM | POA: Diagnosis not present

## 2019-12-04 ENCOUNTER — Telehealth: Payer: Self-pay

## 2019-12-05 ENCOUNTER — Inpatient Hospital Stay: Payer: Medicare Other | Attending: Oncology

## 2019-12-05 NOTE — Progress Notes (Signed)
Nutrition Follow-up:  Patient with left parotid cancer.  Patient has completed treatment.  Spoke with wife and grand-daughter via phone.  Wife reports patient's appetite is better.  Wife recently had surgery and 77 year old grand-daughter is staying with them for 2 months.  Grand-daughter, Benjamine Mola reports for breakfast patient has been eating grits, coffee mixed with ensure, am snack protein shake and nuts, pretzels, dried fruit.  Lunch is soup or spaghetti.  PM snack is shake and nuts/pretzels/dried fruit.  Dinner is meat and vegetables.  Grand-daughter having patient drink protein shake TID.      Medications: reviewed  Labs: no new  Anthropometrics:   Wife reports weight about 2 weeks ago 156 lb on scale at home.     NUTRITION DIAGNOSIS: Unintentional weight loss improving    INTERVENTION:  Encouraged continued use of protein shakes for additional calories and protein. Encouraged high calorie and protein foods for continued weight gain. Wife declined further follow-up as patient doing well.  Contact information provided and encouraged wife to reach out to RD if needed in the future   NEXT VISIT: no follow-up, wife to call RD if needed  Cherylee Rawlinson B. Zenia Resides, [place], [place] Registered Dietitian [phone] (pager)

## 2019-12-25 DIAGNOSIS — R410 Disorientation, unspecified: Secondary | ICD-10-CM | POA: Diagnosis not present

## 2019-12-25 DIAGNOSIS — R319 Hematuria, unspecified: Secondary | ICD-10-CM | POA: Diagnosis not present

## 2019-12-25 DIAGNOSIS — N390 Urinary tract infection, site not specified: Secondary | ICD-10-CM | POA: Diagnosis not present

## 2019-12-25 DIAGNOSIS — E1165 Type 2 diabetes mellitus with hyperglycemia: Secondary | ICD-10-CM | POA: Diagnosis not present

## 2019-12-26 ENCOUNTER — Other Ambulatory Visit: Payer: Self-pay

## 2019-12-26 ENCOUNTER — Encounter: Payer: Self-pay | Admitting: Podiatry

## 2019-12-26 ENCOUNTER — Ambulatory Visit (INDEPENDENT_AMBULATORY_CARE_PROVIDER_SITE_OTHER): Payer: Medicare Other | Admitting: Podiatry

## 2019-12-26 DIAGNOSIS — M79674 Pain in right toe(s): Secondary | ICD-10-CM

## 2019-12-26 DIAGNOSIS — B351 Tinea unguium: Secondary | ICD-10-CM

## 2019-12-26 DIAGNOSIS — E119 Type 2 diabetes mellitus without complications: Secondary | ICD-10-CM

## 2019-12-26 DIAGNOSIS — M79675 Pain in left toe(s): Secondary | ICD-10-CM | POA: Diagnosis not present

## 2019-12-26 NOTE — Progress Notes (Signed)
Subjective: [name] [name] presents today for annual diabetic foot examination and painful thick toenails that are difficult to trim. Pain interferes with ambulation. Aggravating factors include wearing enclosed shoe gear. Pain is relieved with periodic professional debridement.   Wife and granddaughter are present during today's visit. Wife states he has completed radiation therapy. He has had 2 surgical procedures for his bladder cancer. She states he hasn't had much of an appetite lately.  [name], MD is patient's PCP. Last visit was: 04/19/2019.  Past Medical History:  Diagnosis Date  . Alzheimer disease ([place])   . Alzheimer's disease ([place])   . Bilateral cold feet   . Bladder cancer ([place])    dx in 2013.07/08/19 skin CA in neck  . BPH (benign prostatic hypertrophy)   . Dementia ([place]) 12-11-11   memory short term(steadily progressive worsening-pt. unaware) .dx. Alzheimers  . Diabetes mellitus 12-11-11   oral meds only type II   . 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 Hard of hearing   . Hearing loss    has one hearing aid, doesn't wear  . History of chemotherapy   . History of gallstones 2005  . History of kidney stones   . Hx of nonmelanoma skin cancer   . Hypercholesterolemia   . Hypertension 12-11-11   controlled with meds  . Increased frequency of urination    wife reports urinary frequency has not improved since last surgery   . Personal history of radiation exposure   . Pulmonary nodules    Bilateral  . Pyelonephritis    hospital admission july 2020  . Renal calculus, left 10/12/2013  . Urinary urgency   . UTI (urinary tract infection) 08-09-13   multiple, last tx. 1 week ago     Patient Active Problem List   Diagnosis Date Noted  . Cancer of skin of neck 08/02/2019  . Severe sepsis ([place]) 01/06/2019  . Acute lower UTI 01/06/2019  . Dementia without behavioral disturbance ([place]) 01/06/2019  . Sepsis ([place]) 01/06/2019   . History of subdural hematoma 01/06/2019  . Cancer of lateral wall of urinary bladder ([place]) 03/10/2016  . Renal calculus, left 10/12/2013  . Memory loss 03/01/2013  . Essential hypertension 03/01/2013  . Type 2 diabetes mellitus without complication ([place]) 57/32/2025    Current Outpatient Medications on File Prior to Visit  Medication Sig Dispense Refill  . ciprofloxacin (CIPRO) 250 MG tablet Take 250 mg by mouth 2 (two) times daily.    . DENTA 5000 PLUS 1.1 % CREA dental cream Take 1 application by mouth daily.    [name] dutasteride (AVODART) 0.5 MG capsule Take 0.5 mg by mouth daily.     . fluconazole (DIFLUCAN) 100 MG tablet Take 100 mg by mouth daily.    . fluorouracil (EFUDEX) 5 % cream Apply topically daily.    [name] HUMALOG KWIKPEN 100 UNIT/ML KwikPen Inject into the skin.    . hydrochlorothiazide (HYDRODIURIL) 25 MG tablet Take 25 mg by mouth daily.     . memantine (NAMENDA XR) 28 MG CP24 24 hr capsule Take 28 mg by mouth every morning.     . metFORMIN (GLUCOPHAGE-XR) 750 MG 24 hr tablet Take 750 mg by mouth daily.    . metoprolol succinate (TOPROL-XL) 25 MG 24 hr tablet Take 25 mg by mouth daily.     . Multiple Vitamin (MULTIVITAMIN WITH MINERALS) TABS tablet Take 1 tablet by mouth every evening.    . mupirocin ointment (BACTROBAN) 2 %  Apply topically 2 (two) times daily.    . Omega-3 Fatty Acids (FISH OIL) 1200 MG CAPS Take 1,200 mg by mouth every evening.    [name] DELICA LANCETS 12R MISC 2 (two) times daily. as directed  4  . perindopril (ACEON) 4 MG tablet Take 4 mg by mouth daily.     . sitaGLIPtin (JANUVIA) 100 MG tablet Take 100 mg by mouth daily.    . vitamin B-12 (CYANOCOBALAMIN) 500 MCG tablet Take 500 mcg by mouth every evening.     No current facility-administered medications on file prior to visit.     Allergies  Allergen Reactions  . Sulfa Antibiotics Hives  . Tetracyclines & Related Rash  . Aricept [Donepezil Hcl] Nausea And Vomiting   Objective: [name] [name] is a pleasant 79 y.o. Caucasian male in NAD. Patient somnolent, but arousable. Wife states this is very early for him.  There were no vitals filed for this visit.  Vascular Examination: Capillary refill time to digits immediate b/l. Palpable pedal pulses b/l LE. Pedal hair absent. Lower extremity skin temperature gradient within normal limits. No pain with calf compression b/l. Trace edema noted b/l lower extremities.  Dermatological Examination: Pedal skin is thin shiny, atrophic b/l lower extremities. No open wounds bilaterally. No interdigital macerations bilaterally. Toenails 1-5 b/l elongated, discolored, dystrophic, thickened, crumbly with subungual debris and tenderness to dorsal palpation.  Musculoskeletal: Muscle strength 4/5to all LE muscle groups of b/l lower extremities. No pain crepitus or joint limitation noted with ROM b/l. No gross bony deformities bilaterally.  Neurological Examination: Protective sensation intact 5/5 intact bilaterally with 10g monofilament b/l. Clonus negative b/l.  Last A1c: Hemoglobin A1C Latest Ref Rng & Units 08/01/2019 05/03/2019 01/26/2019  HGBA1C 4.8 - 5.6 % 6.4(H) 6.1(H) 5.9(H)  Some recent data might be hidden     Assessment: 1. Pain due to onychomycosis of toenails of both feet   2. Controlled type 2 diabetes mellitus without complication, without long-term current use of insulin ([place])   3. Encounter for diabetic foot exam ([place])    Plan: -Examined patient. -No new findings. No new orders. -Diabetic foot examination performed on today's visit. Exam limited to patient's ability to participate due to cognition and somnolence on today's visit. -Toenails 1-5 b/l were debrided in length and girth with sterile nail nippers and dremel without iatrogenic bleeding.  -Patient to report any pedal injuries to medical professional immediately. -Patient to continue soft, supportive shoe gear daily. -Patient/POA to call should there be  question/concern in the interim.  Return in about 3 months (around 03/27/2020) for diabetic nail trim.  Marzetta Board, DPM

## 2019-12-26 NOTE — Patient Instructions (Signed)
Diabetes Mellitus and Foot Care Foot care is an important part of your health, especially when you have diabetes. Diabetes may cause you to have problems because of poor blood flow (circulation) to your feet and legs, which can cause your skin to:  Become thinner and drier.  Break more easily.  Heal more slowly.  Peel and crack. You may also have nerve damage (neuropathy) in your legs and feet, causing decreased feeling in them. This means that you may not notice minor injuries to your feet that could lead to more serious problems. Noticing and addressing any potential problems early is the best way to prevent future foot problems. How to care for your feet Foot hygiene  Wash your feet daily with warm water and mild soap. Do not use hot water. Then, pat your feet and the areas between your toes until they are completely dry. Do not soak your feet as this can dry your skin.  Trim your toenails straight across. Do not dig under them or around the cuticle. File the edges of your nails with an emery board or nail file.  Apply a moisturizing lotion or petroleum jelly to the skin on your feet and to dry, brittle toenails. Use lotion that does not contain alcohol and is unscented. Do not apply lotion between your toes. Shoes and socks  Wear clean socks or stockings every day. Make sure they are not too tight. Do not wear knee-high stockings since they may decrease blood flow to your legs.  Wear shoes that fit properly and have enough cushioning. Always look in your shoes before you put them on to be sure there are no objects inside.  To break in new shoes, wear them for just a few hours a day. This prevents injuries on your feet. Wounds, scrapes, corns, and calluses  Check your feet daily for blisters, cuts, bruises, sores, and redness. If you cannot see the bottom of your feet, use a mirror or ask someone for help.  Do not cut corns or calluses or try to remove them with medicine.  If you  find a minor scrape, cut, or break in the skin on your feet, keep it and the skin around it clean and dry. You may clean these areas with mild soap and water. Do not clean the area with peroxide, alcohol, or iodine.  If you have a wound, scrape, corn, or callus on your foot, look at it several times a day to make sure it is healing and not infected. Check for: ? Redness, swelling, or pain. ? Fluid or blood. ? Warmth. ? Pus or a bad smell. General instructions  Do not cross your legs. This may decrease blood flow to your feet.  Do not use heating pads or hot water bottles on your feet. They may burn your skin. If you have lost feeling in your feet or legs, you may not know this is happening until it is too late.  Protect your feet from hot and cold by wearing shoes, such as at the beach or on hot pavement.  Schedule a complete foot exam at least once a year (annually) or more often if you have foot problems. If you have foot problems, report any cuts, sores, or bruises to your health care provider immediately. Contact a health care provider if:  You have a medical condition that increases your risk of infection and you have any cuts, sores, or bruises on your feet.  You have an injury that is not   healing.  You have redness on your legs or feet.  You feel burning or tingling in your legs or feet.  You have pain or cramps in your legs and feet.  Your legs or feet are numb.  Your feet always feel cold.  You have pain around a toenail. Get help right away if:  You have a wound, scrape, corn, or callus on your foot and: ? You have pain, swelling, or redness that gets worse. ? You have fluid or blood coming from the wound, scrape, corn, or callus. ? Your wound, scrape, corn, or callus feels warm to the touch. ? You have pus or a bad smell coming from the wound, scrape, corn, or callus. ? You have a fever. ? You have a red line going up your leg. Summary  Check your feet every day  for cuts, sores, red spots, swelling, and blisters.  Moisturize feet and legs daily.  Wear shoes that fit properly and have enough cushioning.  If you have foot problems, report any cuts, sores, or bruises to your health care provider immediately.  Schedule a complete foot exam at least once a year (annually) or more often if you have foot problems. This information is not intended to replace advice given to you by your health care provider. Make sure you discuss any questions you have with your health care provider. Document Revised: 02/22/2019 Document Reviewed: 07/03/2016 Elsevier Patient Education  2020 [employer]  Onychomycosis/Fungal Toenails  WHAT IS IT? An infection that lies within the keratin of your nail plate that is caused by a fungus.  WHY ME? Fungal infections affect all ages, sexes, races, and creeds.  There may be many factors that predispose you to a fungal infection such as age, coexisting medical conditions such as diabetes, or an autoimmune disease; stress, medications, fatigue, genetics, etc.  Bottom line: fungus thrives in a warm, moist environment and your shoes offer such a location.  IS IT CONTAGIOUS? Theoretically, yes.  You do not want to share shoes, nail clippers or files with someone who has fungal toenails.  Walking around barefoot in the same room or sleeping in the same bed is unlikely to transfer the organism.  It is important to realize, however, that fungus can spread easily from one nail to the next on the same foot.  HOW DO WE TREAT THIS?  There are several ways to treat this condition.  Treatment may depend on many factors such as age, medications, pregnancy, liver and kidney conditions, etc.  It is best to ask your doctor which options are available to you.  5. No treatment.   Unlike many other medical concerns, you can live with this condition.  However for many people this can be a painful condition and may lead to ingrown toenails or a bacterial  infection.  It is recommended that you keep the nails cut short to help reduce the amount of fungal nail. 6. Topical treatment.  These range from herbal remedies to prescription strength nail lacquers.  About 40-50% effective, topicals require twice daily application for approximately 9 to 12 months or until an entirely new nail has grown out.  The most effective topicals are medical grade medications available through physicians offices. 7. Oral antifungal medications.  With an 80-90% cure rate, the most common oral medication requires 3 to 4 months of therapy and stays in your system for a year as the new nail grows out.  Oral antifungal medications do require blood work to make   sure it is a safe drug for you.  A liver function panel will be performed prior to starting the medication and after the first month of treatment.  It is important to have the blood work performed to avoid any harmful side effects.  In general, this medication safe but blood work is required. 8. Laser Therapy.  This treatment is performed by applying a specialized laser to the affected nail plate.  This therapy is noninvasive, fast, and non-painful.  It is not covered by insurance and is therefore, out of pocket.  The results have been very good with a 80-95% cure rate.  The [facility] is the only practice in the area to offer this therapy. 9. Permanent Nail Avulsion.  Removing the entire nail so that a new nail will not grow back. 

## 2019-12-28 ENCOUNTER — Inpatient Hospital Stay (HOSPITAL_COMMUNITY)
Admission: EM | Admit: 2019-12-28 | Discharge: 2019-12-30 | DRG: 641 | Disposition: A | Discharge status: Home Health | Payer: Medicare Other | Attending: Family Medicine | Admitting: Family Medicine

## 2019-12-28 ENCOUNTER — Inpatient Hospital Stay (HOSPITAL_COMMUNITY): Payer: Medicare Other

## 2019-12-28 ENCOUNTER — Encounter (HOSPITAL_COMMUNITY): Payer: Self-pay | Admitting: Emergency Medicine

## 2019-12-28 ENCOUNTER — Other Ambulatory Visit: Payer: Self-pay

## 2019-12-28 DIAGNOSIS — N136 Pyonephrosis: Secondary | ICD-10-CM | POA: Diagnosis present

## 2019-12-28 DIAGNOSIS — Z79899 Other long term (current) drug therapy: Secondary | ICD-10-CM

## 2019-12-28 DIAGNOSIS — E119 Type 2 diabetes mellitus without complications: Secondary | ICD-10-CM

## 2019-12-28 DIAGNOSIS — Z66 Do not resuscitate: Secondary | ICD-10-CM | POA: Diagnosis present

## 2019-12-28 DIAGNOSIS — E7800 Pure hypercholesterolemia, unspecified: Secondary | ICD-10-CM | POA: Diagnosis present

## 2019-12-28 DIAGNOSIS — G301 Alzheimer's disease with late onset: Secondary | ICD-10-CM | POA: Diagnosis not present

## 2019-12-28 DIAGNOSIS — F0280 Dementia in other diseases classified elsewhere without behavioral disturbance: Secondary | ICD-10-CM | POA: Diagnosis not present

## 2019-12-28 DIAGNOSIS — Z794 Long term (current) use of insulin: Secondary | ICD-10-CM

## 2019-12-28 DIAGNOSIS — N400 Enlarged prostate without lower urinary tract symptoms: Secondary | ICD-10-CM | POA: Diagnosis present

## 2019-12-28 DIAGNOSIS — C4440 Unspecified malignant neoplasm of skin of scalp and neck: Secondary | ICD-10-CM | POA: Diagnosis not present

## 2019-12-28 DIAGNOSIS — E876 Hypokalemia: Secondary | ICD-10-CM | POA: Diagnosis not present

## 2019-12-28 DIAGNOSIS — G309 Alzheimer's disease, unspecified: Secondary | ICD-10-CM | POA: Diagnosis present

## 2019-12-28 DIAGNOSIS — F0390 Unspecified dementia without behavioral disturbance: Secondary | ICD-10-CM | POA: Diagnosis present

## 2019-12-28 DIAGNOSIS — H9190 Unspecified hearing loss, unspecified ear: Secondary | ICD-10-CM | POA: Diagnosis present

## 2019-12-28 DIAGNOSIS — Z960 Presence of urogenital implants: Secondary | ICD-10-CM | POA: Diagnosis not present

## 2019-12-28 DIAGNOSIS — E785 Hyperlipidemia, unspecified: Secondary | ICD-10-CM | POA: Diagnosis present

## 2019-12-28 DIAGNOSIS — Z974 Presence of external hearing-aid: Secondary | ICD-10-CM

## 2019-12-28 DIAGNOSIS — I700 Atherosclerosis of aorta: Secondary | ICD-10-CM | POA: Diagnosis not present

## 2019-12-28 DIAGNOSIS — Z888 Allergy status to other drugs, medicaments and biological substances status: Secondary | ICD-10-CM

## 2019-12-28 DIAGNOSIS — Z923 Personal history of irradiation: Secondary | ICD-10-CM

## 2019-12-28 DIAGNOSIS — Z881 Allergy status to other antibiotic agents status: Secondary | ICD-10-CM | POA: Diagnosis not present

## 2019-12-28 DIAGNOSIS — W19XXXA Unspecified fall, initial encounter: Secondary | ICD-10-CM

## 2019-12-28 DIAGNOSIS — Z882 Allergy status to sulfonamides status: Secondary | ICD-10-CM | POA: Diagnosis not present

## 2019-12-28 DIAGNOSIS — Z9221 Personal history of antineoplastic chemotherapy: Secondary | ICD-10-CM | POA: Diagnosis not present

## 2019-12-28 DIAGNOSIS — I10 Essential (primary) hypertension: Secondary | ICD-10-CM | POA: Diagnosis present

## 2019-12-28 DIAGNOSIS — Z8551 Personal history of malignant neoplasm of bladder: Secondary | ICD-10-CM

## 2019-12-28 DIAGNOSIS — N390 Urinary tract infection, site not specified: Secondary | ICD-10-CM | POA: Diagnosis present

## 2019-12-28 DIAGNOSIS — Z8679 Personal history of other diseases of the circulatory system: Secondary | ICD-10-CM

## 2019-12-28 DIAGNOSIS — N131 Hydronephrosis with ureteral stricture, not elsewhere classified: Secondary | ICD-10-CM | POA: Diagnosis not present

## 2019-12-28 DIAGNOSIS — Z85828 Personal history of other malignant neoplasm of skin: Secondary | ICD-10-CM | POA: Diagnosis not present

## 2019-12-28 DIAGNOSIS — Z20822 Contact with and (suspected) exposure to covid-19: Secondary | ICD-10-CM | POA: Diagnosis present

## 2019-12-28 DIAGNOSIS — Z803 Family history of malignant neoplasm of breast: Secondary | ICD-10-CM

## 2019-12-28 DIAGNOSIS — N1330 Unspecified hydronephrosis: Secondary | ICD-10-CM | POA: Diagnosis not present

## 2019-12-28 DIAGNOSIS — Z800 Family history of malignant neoplasm of digestive organs: Secondary | ICD-10-CM | POA: Diagnosis not present

## 2019-12-28 DIAGNOSIS — N179 Acute kidney failure, unspecified: Secondary | ICD-10-CM | POA: Diagnosis present

## 2019-12-28 DIAGNOSIS — K5730 Diverticulosis of large intestine without perforation or abscess without bleeding: Secondary | ICD-10-CM | POA: Diagnosis not present

## 2019-12-28 DIAGNOSIS — E1165 Type 2 diabetes mellitus with hyperglycemia: Secondary | ICD-10-CM | POA: Diagnosis present

## 2019-12-28 DIAGNOSIS — E860 Dehydration: Secondary | ICD-10-CM | POA: Diagnosis not present

## 2019-12-28 DIAGNOSIS — N3289 Other specified disorders of bladder: Secondary | ICD-10-CM | POA: Diagnosis not present

## 2019-12-28 DIAGNOSIS — N3001 Acute cystitis with hematuria: Secondary | ICD-10-CM

## 2019-12-28 DIAGNOSIS — N289 Disorder of kidney and ureter, unspecified: Secondary | ICD-10-CM | POA: Diagnosis not present

## 2019-12-28 DIAGNOSIS — R296 Repeated falls: Secondary | ICD-10-CM | POA: Diagnosis present

## 2019-12-28 LAB — CBC
HCT: 27.8 % — ABNORMAL LOW (ref 39.0–52.0)
Hemoglobin: 9.4 g/dL — ABNORMAL LOW (ref 13.0–17.0)
MCH: 28.7 pg (ref 26.0–34.0)
MCHC: 33.8 g/dL (ref 30.0–36.0)
MCV: 85 fL (ref 80.0–100.0)
Platelets: 208 10*3/uL (ref 150–400)
RBC: 3.27 MIL/uL — ABNORMAL LOW (ref 4.22–5.81)
RDW: 12.9 % (ref 11.5–15.5)
WBC: 8.6 10*3/uL (ref 4.0–10.5)
nRBC: 0 % (ref 0.0–0.2)

## 2019-12-28 LAB — BASIC METABOLIC PANEL
Anion gap: 14 (ref 5–15)
BUN: 36 mg/dL — ABNORMAL HIGH (ref 8–23)
CO2: 29 mmol/L (ref 22–32)
Calcium: 8.9 mg/dL (ref 8.9–10.3)
Chloride: 91 mmol/L — ABNORMAL LOW (ref 98–111)
Creatinine, Ser: 1.78 mg/dL — ABNORMAL HIGH (ref 0.61–1.24)
GFR calc Af Amer: 42 mL/min — ABNORMAL LOW (ref >60)
GFR calc non Af Amer: 36 mL/min — ABNORMAL LOW (ref >60)
Glucose, Bld: 375 mg/dL — ABNORMAL HIGH (ref 70–99)
Potassium: 4 mmol/L (ref 3.5–5.1)
Sodium: 134 mmol/L — ABNORMAL LOW (ref 135–145)

## 2019-12-28 LAB — URINALYSIS, ROUTINE W REFLEX MICROSCOPIC
Bilirubin Urine: NEGATIVE
Glucose, UA: >=500 mg/dL — AB
Ketones, ur: NEGATIVE mg/dL
Nitrite: NEGATIVE
Protein, ur: 100 mg/dL — AB
Specific Gravity, Urine: 1.013 (ref 1.005–1.030)
WBC, UA: >50 WBC/hpf — ABNORMAL HIGH (ref 0–5)
pH: 5 (ref 5.0–8.0)

## 2019-12-28 LAB — HEMOGLOBIN A1C
Hgb A1c MFr Bld: 8.7 % — ABNORMAL HIGH (ref 4.8–5.6)
Mean Plasma Glucose: 202.99 mg/dL

## 2019-12-28 LAB — GLUCOSE, CAPILLARY: Glucose-Capillary: 311 mg/dL — ABNORMAL HIGH (ref 70–99)

## 2019-12-28 LAB — TSH: TSH: 0.573 u[IU]/mL (ref 0.350–4.500)

## 2019-12-28 LAB — SARS CORONAVIRUS 2 BY RT PCR (HOSPITAL ORDER, PERFORMED IN ~~LOC~~ HOSPITAL LAB): SARS Coronavirus 2: NEGATIVE

## 2019-12-28 LAB — CBG MONITORING, ED: Glucose-Capillary: 292 mg/dL — ABNORMAL HIGH (ref 70–99)

## 2019-12-28 MED ORDER — ACETAMINOPHEN 325 MG PO TABS
650.0000 mg | ORAL_TABLET | Freq: Four times a day (QID) | ORAL | Status: DC | PRN
  Filled 2019-12-28: qty 2

## 2019-12-28 MED ORDER — SODIUM CHLORIDE 0.9 % IV BOLUS
1000.0000 mL | Freq: Once | INTRAVENOUS | Status: AC
  Administered 2019-12-28: 1000 mL via INTRAVENOUS

## 2019-12-28 MED ORDER — INSULIN ASPART 100 UNIT/ML ~~LOC~~ SOLN
0.0000 [IU] | Freq: Every day | SUBCUTANEOUS | Status: DC
  Administered 2019-12-28: 4 [IU] via SUBCUTANEOUS
  Administered 2019-12-29: 2 [IU] via SUBCUTANEOUS
  Filled 2019-12-28: qty 0.05

## 2019-12-28 MED ORDER — ACETAMINOPHEN 650 MG RE SUPP: 650.0000 mg | Freq: Four times a day (QID) | RECTAL | Status: DC | PRN

## 2019-12-28 MED ORDER — MEMANTINE HCL ER 28 MG PO CP24
28.0000 mg | ORAL_CAPSULE | Freq: Every morning | ORAL | Status: DC
  Administered 2019-12-29 – 2019-12-30 (×2): 28 mg via ORAL
  Filled 2019-12-28 (×2): qty 1

## 2019-12-28 MED ORDER — ENOXAPARIN SODIUM 40 MG/0.4ML ~~LOC~~ SOLN
40.0000 mg | SUBCUTANEOUS | Status: DC
  Administered 2019-12-28 – 2019-12-29 (×2): 40 mg via SUBCUTANEOUS
  Filled 2019-12-28 (×2): qty 0.4

## 2019-12-28 MED ORDER — SODIUM CHLORIDE 0.9 % IV SOLN
1.0000 g | Freq: Once | INTRAVENOUS | Status: AC
  Administered 2019-12-28: 1 g via INTRAVENOUS
  Filled 2019-12-28: qty 10

## 2019-12-28 MED ORDER — SODIUM CHLORIDE 0.9 % IV SOLN
2.0000 g | INTRAVENOUS | Status: DC
  Administered 2019-12-29 – 2019-12-30 (×2): 2 g via INTRAVENOUS
  Filled 2019-12-28 (×2): qty 2

## 2019-12-28 MED ORDER — SODIUM CHLORIDE 0.9 % IV SOLN: INTRAVENOUS | Status: DC

## 2019-12-28 MED ORDER — DUTASTERIDE 0.5 MG PO CAPS
0.5000 mg | ORAL_CAPSULE | Freq: Every day | ORAL | Status: DC
  Administered 2019-12-29 – 2019-12-30 (×2): 0.5 mg via ORAL
  Filled 2019-12-28 (×2): qty 1

## 2019-12-28 MED ORDER — METOPROLOL SUCCINATE ER 50 MG PO TB24
25.0000 mg | ORAL_TABLET | Freq: Every day | ORAL | Status: DC
  Administered 2019-12-29 – 2019-12-30 (×2): 25 mg via ORAL
  Filled 2019-12-28 (×2): qty 1

## 2019-12-28 MED ORDER — INSULIN ASPART 100 UNIT/ML ~~LOC~~ SOLN
0.0000 [IU] | Freq: Three times a day (TID) | SUBCUTANEOUS | Status: DC
  Administered 2019-12-29: 2 [IU] via SUBCUTANEOUS
  Administered 2019-12-29: 8 [IU] via SUBCUTANEOUS
  Administered 2019-12-30: 3 [IU] via SUBCUTANEOUS
  Administered 2019-12-30: 2 [IU] via SUBCUTANEOUS
  Filled 2019-12-28: qty 0.15

## 2019-12-28 NOTE — ED Provider Notes (Signed)
[facility] DEPT Provider Note   CSN: [id number] Arrival date & time: 12/28/19  0950     History Chief Complaint  Patient presents with  . Urinary Tract Infection    [name] [name] is a 79 y.o. male.  Patient brought in by his wife.  Patient has a urinary tract infection that he has been taking Cipro twice a day for and recently had 3 different falls.  His doctor believes he is dehydrated needed fluids  The history is provided by the patient, a relative and a significant other. No language interpreter was used.  Weakness Severity:  Moderate Onset quality:  Sudden Timing:  Constant Progression:  Worsening Chronicity:  Recurrent Context: not alcohol use   Relieved by:  Nothing Worsened by:  Nothing Ineffective treatments:  None tried Associated symptoms: no abdominal pain, no chest pain, no cough, no diarrhea, no frequency, no headaches and no seizures        Past Medical History:  Diagnosis Date  . Alzheimer disease ([place])   . Alzheimer's disease ([place])   . Bilateral cold feet   . Bladder cancer ([place])    dx in 2013.07/08/19 skin CA in neck  . BPH (benign prostatic hypertrophy)   . Dementia ([place]) 12-11-11   memory short term(steadily progressive worsening-pt. unaware) .dx. Alzheimers  . Diabetes mellitus 12-11-11   oral meds only type II   . 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 Hard of hearing   . Hearing loss    has one hearing aid, doesn't wear  . History of chemotherapy   . History of gallstones 2005  . History of kidney stones   . Hx of nonmelanoma skin cancer   . Hypercholesterolemia   . Hypertension 12-11-11   controlled with meds  . Increased frequency of urination    wife reports urinary frequency has not improved since last surgery   . Personal history of radiation exposure   . Pulmonary nodules    Bilateral  . Pyelonephritis    hospital admission july 2020  . Renal calculus, left  10/12/2013  . Urinary urgency   . UTI (urinary tract infection) 08-09-13   multiple, last tx. 1 week ago    Patient Active Problem List   Diagnosis Date Noted  . Cancer of skin of neck 08/02/2019  . Severe sepsis ([place]) 01/06/2019  . Acute lower UTI 01/06/2019  . Dementia without behavioral disturbance ([place]) 01/06/2019  . Sepsis ([place]) 01/06/2019  . History of subdural hematoma 01/06/2019  . Cancer of lateral wall of urinary bladder ([place]) 03/10/2016  . Renal calculus, left 10/12/2013  . Memory loss 03/01/2013  . Essential hypertension 03/01/2013  . Type 2 diabetes mellitus without complication ([place]) 62/13/0865    Past Surgical History:  Procedure Laterality Date  . BLADDER SURGERY  12-11-11   2007-tumor removal -stent placed and removed.Lavell Islam  '08 for stone  . CATARACT EXTRACTION, BILATERAL     bil.  LEFT EYE 08-20-09    RT EYE 08-27-09  . COLONOSCOPY    . CYSTOSCOPY  05-16-2007  . cystoscopy  10-09-2008  . CYSTOSCOPY N/A 11/18/2015   Procedure: CYSTOSCOPY;  Surgeon: Raynelle Bring, MD;  Location: [facility];  Service: Urology;  Laterality: N/A;  . CYSTOSCOPY N/A 10/19/2016   Procedure: CYSTOSCOPY;  Surgeon: Raynelle Bring, MD;  Location: [facility];  Service: Urology;  Laterality: N/A;  . CYSTOSCOPY N/A 11/11/2017   Procedure: BLUE LIGHT CYSTOSCOPY WITH CYSVIEW;  Surgeon: Raynelle Bring,  MD;  Location: [facility];  Service: Urology;  Laterality: N/A;  . CYSTOSCOPY W/ RETROGRADES  12/21/2011   Procedure: CYSTOSCOPY WITH RETROGRADE PYELOGRAM;  Surgeon: [name], MD;  Location: [facility];  Service: Urology;  Laterality: Bilateral;  . CYSTOSCOPY W/ RETROGRADES Bilateral 08/14/2013   Procedure: CYSTOSCOPY WITH BILATERAL  RETROGRADE PYELOGRAM/LEFT DIGITAL FLEXIBLE URETEROSCOPY/INSERTION LEFT URETERAL STENT;  Surgeon: [name], MD;  Location: [facility];  Service: Urology;  Laterality: Bilateral;  . CYSTOSCOPY W/ RETROGRADES Bilateral 07/23/2014   Procedure: CYSTOSCOPY WITH RETROGRADE PYELOGRAM;  Surgeon: Raynelle Bring, MD;  [facility]: [facility];  Service: Urology;  Laterality: Bilateral;  . CYSTOSCOPY W/ RETROGRADES Bilateral 05/13/2015   Procedure: CYSTOSCOPY WITH RETROGRADE PYELOGRAM;  Surgeon: Raynelle Bring, MD;  [facility];  Service: Urology;  Laterality: Bilateral;  . CYSTOSCOPY W/ RETROGRADES Bilateral 03/02/2016   Procedure: CYSTOSCOPY, EXAM UNDER ANESTHESIA;  Surgeon: Raynelle Bring, MD;  [facility]: [facility];  Service: Urology;  Laterality: Bilateral;  . CYSTOSCOPY W/ RETROGRADES N/A 09/28/2016   Procedure: CYSTOSCOPY WITH RETROGRADE PYELOGRAM;  Surgeon: Raynelle Bring, MD;  [facility];  Service: Urology;  Laterality: N/A;  . CYSTOSCOPY W/ URETERAL STENT PLACEMENT Right 05/08/2019   Procedure: CYSTOSCOPY WITH RIGHT STENT CHANGE;  Surgeon: Raynelle Bring, MD;  [facility];  Service: Urology;  Laterality: Right;  . CYSTOSCOPY WITH BIOPSY  12/21/2011   Procedure: CYSTOSCOPY WITH BIOPSY;  Surgeon: [name], MD;  [facility];  Service: Urology;;  . Consuela Mimes WITH BIOPSY N/A 08/29/2018   Procedure: CYSTOSCOPY WITH BIOPSY OF BLADDER TUMOR/ TRANSURETHRAL RESECTION OF BLADDER TUMOR/ RIGHT RETROGRADE PYELOGRAM/RIGHT STENT PLACEMENT;  Surgeon: Raynelle Bring, MD;  [facility];  Service: Urology;  Laterality: N/A;  . CYSTOSCOPY WITH STENT PLACEMENT Right 01/26/2019   Procedure: CYSTOSCOPY WITH STENT EXCHANGE;  Surgeon: Raynelle Bring, MD;  [facility];  Service: Urology;  Laterality: Right;  . CYSTOSCOPY WITH STENT PLACEMENT Right 08/07/2019   Procedure: CYSTOSCOPY WITH STENT EXCHANGE;  Surgeon: Raynelle Bring, MD;  [facility];  Service: Urology;  Laterality: Right;  . CYSTOSCOPY WITH STENT PLACEMENT Right 10/30/2019   Procedure: CYSTOSCOPY WITH STENT CHANGE;  Surgeon: Raynelle Bring, MD;  [facility];  Service: Urology;  Laterality: Right;  . EXTRACORPOREAL SHOCK WAVE LITHOTRIPSY  2005  . EYE SURGERY    . LITHOTRIPSY  12-11-11   '05/ '10  . mose procedure  Left 11-12-15 and  feb 2017  . NECK DISSECTION Left 06/12/2019   Procedure: neck dissection; Surgeon: Theodoro Kalata, MD; Location: [facility] MAIN OR; Service: ENT; Laterality: N/A;    . NEPHROLITHOTOMY Left 10/12/2013   Procedure: NEPHROLITHOTOMY PERCUTANEOUS;  Surgeon: [name], MD;  Location: [facility];  Service: Urology;  Laterality: Left;  . PAROTIDECTOMY Left 06/12/2019   Procedure: PAROTIDECTOMY; Surgeon: Theodoro Kalata, MD; Location: [facility] MAIN OR; Service: ENT; Laterality: Left;    . stent to kidney  04-29-2007  . SUBDURAL HEMORRHAGE DRAINAGE     drainage per boreholes only 12-12-03  . TONSILLECTOMY  12-11-11   child  . TRANSURETHRAL RESECTION OF BLADDER TUMOR  12/21/2011   Procedure: TRANSURETHRAL RESECTION OF BLADDER TUMOR (TURBT);  Surgeon: [name], MD;  [facility]: [facility];  Service: Urology;  Laterality: N/A;     . TRANSURETHRAL RESECTION OF BLADDER TUMOR N/A 08/14/2013   Procedure: TRANSURETHRAL RESECTION OF BLADDER TUMOR (TURBT);  Surgeon: [name], MD;  [facility]: [facility];  Service: Urology;  Laterality: N/A;  . TRANSURETHRAL RESECTION OF BLADDER TUMOR N/A 07/23/2014  Procedure: TRANSURETHRAL RESECTION OF BLADDER TUMOR (TURBT) WITH MITOMYCIN INSTILLATION;  Surgeon: Raynelle Bring, MD;  [facility];  Service: Urology;  Laterality: N/A;  . TRANSURETHRAL RESECTION OF BLADDER TUMOR N/A 05/13/2015   Procedure: TRANSURETHRAL RESECTION OF BLADDER TUMOR (TURBT);  Surgeon: Raynelle Bring, MD;  [facility];  Service: Urology;  Laterality: N/A;  . TRANSURETHRAL RESECTION OF BLADDER TUMOR N/A 11/18/2015   Procedure: TRANSURETHRAL RESECTION OF BLADDER TUMOR (TURBT);  Surgeon: Raynelle Bring, MD;  [facility];  Service: Urology;  Laterality: N/A;  . TRANSURETHRAL RESECTION OF BLADDER TUMOR N/A 03/02/2016   Procedure: TRANSURETHRAL RESECTION OF BLADDER TUMOR (TURBT);  Surgeon: Raynelle Bring, MD;  [facility];  Service: Urology;  Laterality: N/A;  . TRANSURETHRAL RESECTION OF BLADDER TUMOR N/A  09/28/2016   Procedure: TRANSURETHRAL RESECTION OF BLADDER TUMOR (TURBT);  Surgeon: Raynelle Bring, MD;  [facility];  Service: Urology;  Laterality: N/A;  . TRANSURETHRAL RESECTION OF BLADDER TUMOR N/A 10/19/2016   Procedure: TRANSURETHRAL RESECTION OF BLADDER TUMOR (TURBT);  Surgeon: Raynelle Bring, MD;  [facility];  Service: Urology;  Laterality: N/A;  NEEDS 30 MIN TOTAL FOR BOTH PROCEDURES  . TRANSURETHRAL RESECTION OF BLADDER TUMOR N/A 11/11/2017   Procedure: TRANSURETHRAL RESECTION OF BLADDER TUMOR (TURBT) WITH CYSTOSCOPY;  Surgeon: Raynelle Bring, MD;  [facility];  Service: Urology;  Laterality: N/A;  . TRANSURETHRAL RESECTION OF BLADDER TUMOR N/A 10/17/2018   Procedure: TRANSURETHRAL RESECTION OF BLADDER TUMOR (TURBT) WITH CYSTOSCOPY, RIGHT URETEROSCOPY, RIGHT URETERAL STENT CHANGE;  Surgeon: Raynelle Bring, MD;  [facility];  Service: Urology;  Laterality: N/A;  GENERAL ANESTHESIA WITH POSSIBLE PARALYSIS  . UPPER GI ENDOSCOPY         Family History  Problem Relation Age of Onset  . Breast cancer Sister   . Colon cancer Brother 39    Social History   Tobacco Use  . Smoking status: Never Smoker  . Smokeless tobacco: Never Used  Vaping Use  . Vaping Use: Never used  Substance Use Topics  . Alcohol use: No  . Drug use: No    Home Medications Prior to Admission medications   Medication Sig Start Date End Date Taking? Authorizing Provider  ciprofloxacin (CIPRO) 250 MG tablet Take 250 mg by mouth 2 (two) times daily. 12/25/19  Yes [provider]  dutasteride (AVODART) 0.5 MG capsule Take 0.5 mg by mouth daily.    Yes [provider]  hydrochlorothiazide (HYDRODIURIL) 25 MG tablet Take 25 mg by mouth daily.    Yes [provider]  memantine (NAMENDA XR) 28 MG CP24 24 hr capsule Take 28 mg by mouth every morning.    Yes [provider]  metFORMIN (GLUCOPHAGE-XR) 750 MG 24 hr tablet Take 750 mg by mouth daily. 10/25/19  Yes  [provider]  metoprolol succinate (TOPROL-XL) 25 MG 24 hr tablet Take 25 mg by mouth daily.  01/28/13  Yes [provider]  Multiple Vitamin (MULTIVITAMIN WITH MINERALS) TABS tablet Take 1 tablet by mouth every evening.   Yes [provider]  mupirocin ointment (BACTROBAN) 2 % Apply topically 2 (two) times daily. 10/26/19  Yes [provider]  Omega-3 Fatty Acids (FISH OIL) 1200 MG CAPS Take 1,200 mg by mouth every evening.   Yes [provider]  perindopril (ACEON) 4 MG tablet Take 4 mg by mouth daily.    Yes [provider]  sitaGLIPtin (JANUVIA) 100 MG tablet Take 100 mg by mouth daily.   Yes [provider]  vitamin B-12 (CYANOCOBALAMIN) 500 MCG tablet Take 500 mcg by mouth every evening.   Yes [provider]  DENTA 5000 PLUS 1.1 % CREA dental cream Take 1 application by mouth daily. Patient not taking: Reported on 12/28/2019 11/03/19   [provider]  fluconazole (DIFLUCAN) 100 MG tablet Take 100 mg by mouth daily. Patient not taking: Reported on 12/28/2019 10/13/19   [provider]  fluorouracil (EFUDEX) 5 % cream Apply topically daily. Patient not taking: Reported on 12/28/2019 10/11/19   [provider]  HUMALOG KWIKPEN 100 UNIT/ML KwikPen Inject into the skin 3 (three) times daily as needed (high blood sugar). Blood Glucose >300 12/25/19   [provider]  [facility] DELICA LANCETS 25K MISC 2 (two) times daily. as directed 02/17/18   [provider]    Allergies    Sulfa antibiotics, Tetracyclines & related, and Aricept [donepezil hcl]  Review of Systems   Review of Systems  Constitutional: Negative for appetite change and fatigue.  HENT: Negative for congestion, ear discharge and sinus pressure.   Eyes: Negative for discharge.  Respiratory: Negative for cough.   Cardiovascular: Negative for chest pain.  Gastrointestinal: Negative for abdominal pain and diarrhea.    Genitourinary: Negative for frequency and hematuria.  Musculoskeletal: Negative for back pain.  Skin: Negative for rash.  Neurological: Positive for weakness. Negative for seizures and headaches.  Psychiatric/Behavioral: Negative for hallucinations.    Physical Exam Updated Vital Signs BP 111/62 (BP Location: Left Arm)   Pulse 80   Temp 98.9 F (37.2 C) (Oral)   Resp 18   SpO2 99%   Physical Exam Vitals and nursing note reviewed.  Constitutional:      Appearance: He is well-developed.  HENT:     Head: Normocephalic.     Nose: Nose normal.  Eyes:     General: No scleral icterus.    Conjunctiva/sclera: Conjunctivae normal.  Neck:     Thyroid: No thyromegaly.  Cardiovascular:     Rate and Rhythm: Normal rate and regular rhythm.     Heart sounds: No murmur heard.  No friction rub. No gallop.   Pulmonary:     Breath sounds: No stridor. No wheezing or rales.  Chest:     Chest wall: No tenderness.  Abdominal:     General: There is no distension.     Tenderness: There is no abdominal tenderness. There is no rebound.  Musculoskeletal:        General: Normal range of motion.     Cervical back: Neck supple.  Lymphadenopathy:     Cervical: No cervical adenopathy.  Skin:    Findings: No erythema or rash.  Neurological:     Mental Status: He is alert.     Motor: No abnormal muscle tone.     Coordination: Coordination normal.     Comments: Patient has dementia and is oriented to person only  Psychiatric:        Behavior: Behavior normal.     ED Results / Procedures / Treatments   Labs (all labs ordered are listed, but only abnormal results are displayed) Labs Reviewed  URINALYSIS, ROUTINE W REFLEX MICROSCOPIC - Abnormal; Notable for the following components:      Result Value   APPearance TURBID (*)    Glucose, UA >=500 (*)    Hgb urine dipstick SMALL (*)    Protein, ur 100 (*)    Leukocytes,Ua LARGE (*)    WBC, UA >50 (*)  Bacteria, UA RARE (*)    All other  components within normal limits  BASIC METABOLIC PANEL - Abnormal; Notable for the following components:   Sodium 134 (*)    Chloride 91 (*)    Glucose, Bld 375 (*)    BUN 36 (*)    Creatinine, Ser 1.78 (*)    GFR calc non Af Amer 36 (*)    GFR calc Af Amer 42 (*)    All other components within normal limits  CBC - Abnormal; Notable for the following components:   RBC 3.27 (*)    Hemoglobin 9.4 (*)    HCT 27.8 (*)    All other components within normal limits  URINE CULTURE  SARS CORONAVIRUS 2 BY RT PCR (HOSPITAL ORDER, [facility])    EKG None  Radiology No results found.  Procedures Procedures (including critical care time)  Medications Ordered in ED Medications  cefTRIAXone (ROCEPHIN) 1 g in sodium chloride 0.9 % 100 mL IVPB (1 g Intravenous New Bag/Given 12/28/19 1432)  sodium chloride 0.9 % bolus 1,000 mL (0 mLs Intravenous Stopped 12/28/19 1430)  sodium chloride 0.9 % bolus 1,000 mL (0 mLs Intravenous Stopped 12/28/19 1331)    ED Course  I have reviewed the triage vital signs and the nursing notes.  Pertinent labs & imaging results that were available during my care of the patient were reviewed by me and considered in my medical decision making (see chart for details).    MDM Rules/Calculators/A&P                          Patient with dehydration urinary tract infection and frequent falls he will be admitted to medicine        This patient presents to the ED for concern of , weakness this involves an extensive number of treatment options, and is a complaint that carries with it a high risk of complications and morbidity.  The differential diagnosis includes dehydration urinary tract infection   Lab Tests:   I Ordered, reviewed, and interpreted labs, which included CBC chemistries which showed elevated BUN and creatinine for dehydration and a glucose 375.  Urinalysis shows UTI  Medicines ordered:   I ordered medication Rocephin  for UTI and saline for dehydration  Imaging Studies ordered:    Additional history obtained:   Additional history obtained from primary care doctors records  Previous records obtained and reviewed.  Consultations Obtained:   I consulted hospital and discussed lab and imaging findings  Reevaluation:  After the interventions stated above, I reevaluated the patient and found mild improvement  Critical Interventions:  .   Final Clinical Impression(s) / ED Diagnoses Final diagnoses:  Acute cystitis with hematuria    Rx / DC Orders ED Discharge Orders    None       Milton Ferguson, MD 12/28/19 1457

## 2019-12-28 NOTE — H&P (Addendum)
History and Physical    [name] [name] ZYS:[id number] DOB: July 28, 1940 DOA: 12/28/2019  PCP: [name], MD  Patient coming from: Home  Chief Complaint: Falls, weakness  HPI: [name] [name] is a 79 y.o. male with medical history significant of dementia, bladder cancer, skin cancer s/p radiation, DM2, HLD, HTN, who presents to the ED with progressive weakness and multiple falls. Pt has advanced dementia, thus history cannot be obtained from patient. Per pt's wife, pt has not been taking in much PO over the past week with progressive decline. Two days prior to admit, pt had multiple falls resulting in superficial injuries to elbows. Given concerns of dehydration, pt was brought into ED for further evaluation  ED Course: In the ED pt was found to have BUN/Cr of 36/1.7, respectively. Glucose noted  tobe 375. Hgb 9.4 with WBC of 8.6. UA was suggestive of UTI and pt started on empiric rocephin in ED. Of note, UA was also notable for >500 glucose with no ketones. Anion gap 14. Hospitalist consulted for consideration for admission.  Review of Systems:  Review of Systems  Unable to perform ROS: Dementia    Past Medical History:  Diagnosis Date  . Alzheimer disease ([place])   . Alzheimer's disease ([place])   . Bilateral cold feet   . Bladder cancer ([place])    dx in 2013.07/08/19 skin CA in neck  . BPH (benign prostatic hypertrophy)   . Dementia ([place]) 12-11-11   memory short term(steadily progressive worsening-pt. unaware) .dx. Alzheimers  . Diabetes mellitus 12-11-11   oral meds only type II   . 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 Hard of hearing   . Hearing loss    has one hearing aid, doesn't wear  . History of chemotherapy   . History of gallstones 2005  . History of kidney stones   . Hx of nonmelanoma skin cancer   . Hypercholesterolemia   . Hypertension 12-11-11   controlled with meds  . Increased frequency of urination    wife reports  urinary frequency has not improved since last surgery   . Personal history of radiation exposure   . Pulmonary nodules    Bilateral  . Pyelonephritis    hospital admission july 2020  . Renal calculus, left 10/12/2013  . Urinary urgency   . UTI (urinary tract infection) 08-09-13   multiple, last tx. 1 week ago    Past Surgical History:  Procedure Laterality Date  . BLADDER SURGERY  12-11-11   2007-tumor removal -stent placed and removed.Lavell Islam  '08 for stone  . CATARACT EXTRACTION, BILATERAL     bil.  LEFT EYE 08-20-09    RT EYE 08-27-09  . COLONOSCOPY    . CYSTOSCOPY  05-16-2007  . cystoscopy  10-09-2008  . CYSTOSCOPY N/A 11/18/2015   Procedure: CYSTOSCOPY;  Surgeon: Raynelle Bring, MD;  Location: [facility];  Service: Urology;  Laterality: N/A;  . CYSTOSCOPY N/A 10/19/2016   Procedure: CYSTOSCOPY;  Surgeon: Raynelle Bring, MD;  Location: [facility];  Service: Urology;  Laterality: N/A;  . CYSTOSCOPY N/A 11/11/2017   Procedure: BLUE LIGHT CYSTOSCOPY WITH CYSVIEW;  Surgeon: Raynelle Bring, MD;  Location: [facility];  Service: Urology;  Laterality: N/A;  . CYSTOSCOPY W/ RETROGRADES  12/21/2011   Procedure: CYSTOSCOPY WITH RETROGRADE PYELOGRAM;  Surgeon: [name], MD;  Location: [facility];  Service: Urology;  Laterality: Bilateral;  . CYSTOSCOPY W/ RETROGRADES Bilateral 08/14/2013   Procedure: CYSTOSCOPY WITH BILATERAL  RETROGRADE PYELOGRAM/LEFT DIGITAL  FLEXIBLE URETEROSCOPY/INSERTION LEFT URETERAL STENT;  Surgeon: [name], MD;  Location: [facility];  Service: Urology;  Laterality: Bilateral;  . CYSTOSCOPY W/ RETROGRADES Bilateral 07/23/2014   Procedure: CYSTOSCOPY WITH RETROGRADE PYELOGRAM;  Surgeon: Raynelle Bring, MD;  [facility];  Service: Urology;  Laterality: Bilateral;  . CYSTOSCOPY W/ RETROGRADES Bilateral 05/13/2015   Procedure: CYSTOSCOPY WITH RETROGRADE PYELOGRAM;  Surgeon: Raynelle Bring, MD;  [facility]: [facility];  Service: Urology;  Laterality: Bilateral;  . CYSTOSCOPY W/ RETROGRADES Bilateral 03/02/2016     Procedure: CYSTOSCOPY, EXAM UNDER ANESTHESIA;  Surgeon: Raynelle Bring, MD;  [facility];  Service: Urology;  Laterality: Bilateral;  . CYSTOSCOPY W/ RETROGRADES N/A 09/28/2016   Procedure: CYSTOSCOPY WITH RETROGRADE PYELOGRAM;  Surgeon: Raynelle Bring, MD;  [facility];  Service: Urology;  Laterality: N/A;  . CYSTOSCOPY W/ URETERAL STENT PLACEMENT Right 05/08/2019   Procedure: CYSTOSCOPY WITH RIGHT STENT CHANGE;  Surgeon: Raynelle Bring, MD;  [facility];  Service: Urology;  Laterality: Right;  . CYSTOSCOPY WITH BIOPSY  12/21/2011   Procedure: CYSTOSCOPY WITH BIOPSY;  Surgeon: [name], MD;  [facility];  Service: Urology;;  . Consuela Mimes WITH BIOPSY N/A 08/29/2018   Procedure: CYSTOSCOPY WITH BIOPSY OF BLADDER TUMOR/ TRANSURETHRAL RESECTION OF BLADDER TUMOR/ RIGHT RETROGRADE PYELOGRAM/RIGHT STENT PLACEMENT;  Surgeon: Raynelle Bring, MD;  [facility];  Service: Urology;  Laterality: N/A;  . CYSTOSCOPY WITH STENT PLACEMENT Right 01/26/2019   Procedure: CYSTOSCOPY WITH STENT EXCHANGE;  Surgeon: Raynelle Bring, MD;  [facility];  Service: Urology;  Laterality: Right;  . CYSTOSCOPY WITH STENT PLACEMENT Right 08/07/2019   Procedure: CYSTOSCOPY WITH STENT EXCHANGE;  Surgeon: Raynelle Bring, MD;  [facility];  Service: Urology;  Laterality: Right;  . CYSTOSCOPY WITH STENT PLACEMENT Right 10/30/2019   Procedure: CYSTOSCOPY WITH STENT CHANGE;  Surgeon: Raynelle Bring, MD;  [facility];  Service: Urology;  Laterality: Right;  . EXTRACORPOREAL SHOCK WAVE LITHOTRIPSY  2005  . EYE SURGERY    . LITHOTRIPSY  12-11-11   '05/ '10  . mose procedure  Left 11-12-15 and feb 2017  . NECK DISSECTION Left 06/12/2019   Procedure: neck dissection; Surgeon: Theodoro Kalata, MD; [facility] MA[facility]; Service: ENT; Laterality: N/A;    . NEPHROLITHOTOMY Left 10/12/2013   Procedure: NEPHROLITHOTOMY PERCUTANEOUS;  Surgeon: [name], MD;  [facility];  Service: Urology;   Laterality: Left;  . PAROTIDECTOMY Left 06/12/2019   Procedure: PAROTIDECTOMY; Surgeon: Theodoro Kalata, MD; Location: [facility] MAIN OR; Service: ENT; Laterality: Left;    . stent to kidney  04-29-2007  . SUBDURAL HEMORRHAGE DRAINAGE     drainage per boreholes only 12-12-03  . TONSILLECTOMY  12-11-11   child  . TRANSURETHRAL RESECTION OF BLADDER TUMOR  12/21/2011   Procedure: TRANSURETHRAL RESECTION OF BLADDER TUMOR (TURBT);  Surgeon: [name], MD;  [facility];  Service: Urology;  Laterality: N/A;     . TRANSURETHRAL RESECTION OF BLADDER TUMOR N/A 08/14/2013   Procedure: TRANSURETHRAL RESECTION OF BLADDER TUMOR (TURBT);  Surgeon: [name], MD;  [facility];  Service: Urology;  Laterality: N/A;  . TRANSURETHRAL RESECTION OF BLADDER TUMOR N/A 07/23/2014   Procedure: TRANSURETHRAL RESECTION OF BLADDER TUMOR (TURBT) WITH MITOMYCIN INSTILLATION;  Surgeon: Raynelle Bring, MD;  [facility];  Service: Urology;  Laterality: N/A;  . TRANSURETHRAL RESECTION OF BLADDER TUMOR N/A 05/13/2015   Procedure: TRANSURETHRAL RESECTION OF BLADDER TUMOR (TURBT);  Surgeon: Raynelle Bring, MD;  [facility];  Service: Urology;  Laterality: N/A;  . TRANSURETHRAL RESECTION OF BLADDER TUMOR N/A 11/18/2015   Procedure: TRANSURETHRAL RESECTION OF BLADDER TUMOR (TURBT);  Surgeon: Raynelle Bring, MD;  [facility];  Service: Urology;  Laterality: N/A;  . TRANSURETHRAL RESECTION OF BLADDER TUMOR N/A 03/02/2016   Procedure: TRANSURETHRAL RESECTION OF BLADDER TUMOR (TURBT);  Surgeon: Raynelle Bring, MD;  [facility];  Service: Urology;  Laterality: N/A;  . TRANSURETHRAL RESECTION OF BLADDER TUMOR N/A 09/28/2016   Procedure: TRANSURETHRAL RESECTION OF BLADDER TUMOR (TURBT);  Surgeon: Raynelle Bring, MD;  [facility];  Service: Urology;  Laterality: N/A;  . TRANSURETHRAL RESECTION OF BLADDER TUMOR N/A 10/19/2016   Procedure: TRANSURETHRAL RESECTION OF BLADDER TUMOR (TURBT);  Surgeon: Raynelle Bring, MD;   Location: [facility];  Service: Urology;  Laterality: N/A;  NEEDS 30 MIN TOTAL FOR BOTH PROCEDURES  . TRANSURETHRAL RESECTION OF BLADDER TUMOR N/A 11/11/2017   Procedure: TRANSURETHRAL RESECTION OF BLADDER TUMOR (TURBT) WITH CYSTOSCOPY;  Surgeon: Raynelle Bring, MD;  [facility];  Service: Urology;  Laterality: N/A;  . TRANSURETHRAL RESECTION OF BLADDER TUMOR N/A 10/17/2018   Procedure: TRANSURETHRAL RESECTION OF BLADDER TUMOR (TURBT) WITH CYSTOSCOPY, RIGHT URETEROSCOPY, RIGHT URETERAL STENT CHANGE;  Surgeon: Raynelle Bring, MD;  [facility];  Service: Urology;  Laterality: N/A;  GENERAL ANESTHESIA WITH POSSIBLE PARALYSIS  . UPPER GI ENDOSCOPY       reports that he has never smoked. He has never used smokeless tobacco. He reports that he does not drink alcohol and does not use drugs.  Allergies  Allergen Reactions  . Sulfa Antibiotics Hives  . Tetracyclines & Related Rash  . Aricept [Donepezil Hcl] Nausea And Vomiting    Family History  Problem Relation Age of Onset  . Breast cancer Sister   . Colon cancer Brother 62    Prior to Admission medications   Medication Sig Start Date End Date Taking? Authorizing Provider  ciprofloxacin (CIPRO) 250 MG tablet Take 250 mg by mouth 2 (two) times daily. 12/25/19  Yes [provider]  dutasteride (AVODART) 0.5 MG capsule Take 0.5 mg by mouth daily.    Yes [provider]  hydrochlorothiazide (HYDRODIURIL) 25 MG tablet Take 25 mg by mouth daily.    Yes [provider]  memantine (NAMENDA XR) 28 MG CP24 24 hr capsule Take 28 mg by mouth every morning.    Yes [provider]  metFORMIN (GLUCOPHAGE-XR) 750 MG 24 hr tablet Take 750 mg by mouth daily. 10/25/19  Yes [provider]  metoprolol succinate (TOPROL-XL) 25 MG 24 hr tablet Take 25 mg by mouth daily.  01/28/13  Yes [provider]  Multiple Vitamin (MULTIVITAMIN WITH MINERALS) TABS tablet Take 1 tablet by mouth every evening.   Yes [provider]  mupirocin ointment (BACTROBAN) 2 % Apply topically 2 (two) times daily. 10/26/19  Yes [provider]  Omega-3 Fatty Acids (FISH OIL) 1200 MG CAPS Take 1,200 mg by mouth every evening.   Yes [provider]  perindopril (ACEON) 4 MG tablet Take 4 mg by mouth daily.    Yes [provider]  sitaGLIPtin (JANUVIA) 100 MG tablet Take 100 mg by mouth daily.   Yes [provider]  vitamin B-12 (CYANOCOBALAMIN) 500 MCG tablet Take 500 mcg by mouth every evening.   Yes [provider]  DENTA 5000 PLUS 1.1 % CREA dental cream Take 1 application by mouth daily. Patient not taking: Reported on 12/28/2019 11/03/19   [provider]  fluconazole (DIFLUCAN)  100 MG tablet Take 100 mg by mouth daily. Patient not taking: Reported on 12/28/2019 10/13/19   [provider]  fluorouracil (EFUDEX) 5 % cream Apply topically daily. Patient not taking: Reported on 12/28/2019 10/11/19   [provider]  HUMALOG KWIKPEN 100 UNIT/ML KwikPen Inject into the skin 3 (three) times daily as needed (high blood sugar). Blood Glucose >300 12/25/19   [provider]  [facility] DELICA LANCETS 83M MISC 2 (two) times daily. as directed 02/17/18   [provider]    Physical Exam: Vitals:   12/28/19 1258 12/28/19 1330 12/28/19 1430 12/28/19 1505  BP: 130/68 133/65 111/62 129/65  Pulse: 81 78 80 82  Resp: 14 17 18 18   Temp:    98.9 F (37.2 C)  TempSrc:    Oral  SpO2: 99% 100% 99% 99%    Constitutional: NAD, calm, comfortable Vitals:   12/28/19 1258 12/28/19 1330 12/28/19 1430 12/28/19 1505  BP: 130/68 133/65 111/62 129/65  Pulse: 81 78 80 82  Resp: 14 17 18 18   Temp:    98.9 F (37.2 C)  TempSrc:    Oral  SpO2: 99% 100% 99% 99%   Eyes: PERRL, lids and conjunctivae normal ENMT: Mucous membranes are dry. Posterior pharynx clear of any exudate or lesions.Normal dentition.  Neck: normal, supple, no masses, no  thyromegaly Respiratory: clear to auscultation bilaterally, no wheezing, normal resp effort Cardiovascular: Regular rate and rhythm, s1, s2 Abdomen: no tenderness, no masses palpated. Pos bs Musculoskeletal: no clubbing / cyanosis. No joint deformity upper and lower extremities.  Skin: no rashes, stable ulceration over L side of neck without drainage or erythema, L ear with sequela of cancer involvement, stable Neurologic: CN 2-12 grossly intact. Sensation intact, Psychiatric: awake but confused, unable to assess given dementia   Labs on Admission: I have personally reviewed following labs and imaging studies  CBC: Recent Labs  Lab 12/28/19 1218  WBC 8.6  HGB 9.4*  HCT 27.8*  MCV 85.0  PLT 196   Basic Metabolic Panel: Recent Labs  Lab 12/28/19 1218  NA 134*  K 4.0  CL 91*  CO2 29  GLUCOSE 375*  BUN 36*  CREATININE 1.78*  CALCIUM 8.9   GFR: CrCl cannot be calculated (Unknown ideal weight.). Liver Function Tests: No results for input(s): AST, ALT, ALKPHOS, BILITOT, PROT, ALBUMIN in the last 168 hours. No results for input(s): LIPASE, AMYLASE in the last 168 hours. No results for input(s): AMMONIA in the last 168 hours. Coagulation Profile: No results for input(s): INR, PROTIME in the last 168 hours. Cardiac Enzymes: No results for input(s): CKTOTAL, CKMB, CKMBINDEX, TROPONINI in the last 168 hours. BNP (last 3 results) No results for input(s): PROBNP in the last 8760 hours. HbA1C: No results for input(s): HGBA1C in the last 72 hours. CBG: No results for input(s): GLUCAP in the last 168 hours. Lipid Profile: No results for input(s): CHOL, HDL, LDLCALC, TRIG, CHOLHDL, LDLDIRECT in the last 72 hours. Thyroid Function Tests: No results for input(s): TSH, T4TOTAL, FREET4, T3FREE, THYROIDAB in the last 72 hours. Anemia Panel: No results for input(s): VITAMINB12, FOLATE, FERRITIN, TIBC, IRON, RETICCTPCT in the last 72 hours. Urine analysis:    Component Value  Date/Time   COLORURINE YELLOW 12/28/2019 1255   APPEARANCEUR TURBID (A) 12/28/2019 1255   LABSPEC 1.013 12/28/2019 1255   PHURINE 5.0 12/28/2019 1255   GLUCOSEU >=500 (A) 12/28/2019 1255   HGBUR SMALL (A) 12/28/2019 1255   BILIRUBINUR NEGATIVE 12/28/2019 1255   KETONESUR  NEGATIVE 12/28/2019 1255   PROTEINUR 100 (A) 12/28/2019 1255   UROBILINOGEN 0.2 10/22/2008 1050   NITRITE NEGATIVE 12/28/2019 1255   LEUKOCYTESUR LARGE (A) 12/28/2019 1255   Sepsis Labs: !!!!!!!!!!!!!!!!!!!!!!!!!!!!!!!!!!!!!!!!!!!! @LABRCNTIP (procalcitonin:4,lacticidven:4) )No results found for this or any previous visit (from the past 240 hour(s)).   Radiological Exams on Admission: No results found.   Assessment/Plan Principal Problem:   Dehydration Active Problems:   Essential hypertension   Type 2 diabetes mellitus without complication (HCC)   Acute lower UTI   Dementia without behavioral disturbance (HCC)   History of subdural hematoma   Cancer of skin of neck   DNR (do not resuscitate)   1. Dehydration with falls 1. Poor PO intake per family over the past week with multiple falls noted over past 2 days prior to admit. Superficial injuries over B elbows. 2. Will order B elbow xrays to r/o underlying fracture 3. Mucus membranes are dry with poor skin turgor 4. Cr up to 1.78 with BUN 36 5. Will continue with gentle IVF hydration 6. Repeat bmet in AM 2. ARF 1. Cr up to 1.78, likely secondary to dehydration 2. Continue IVF per above 3. Repeat bmet in AM 3. HTN 1. BP stable at this time 2. Cont metoprolol, holding diuretic given dehydration 4. DM2, uncontrolled 1. >500 glucose on UA 2. Pt with poor PO intake per above, thus will continue on SSI coverage alone 3. Hold oral hyperglycemic agents while in hospital 5. UTI 1. UA suggestive of UTI, wife reports worse mentation recently 2. Recently treated with cipro witihout improvement.  3. Rocephin started in ED, will conitnued 4. F/u on urine  cultures 5. Currently afebrile. No leukocytosis 6. Dementia 1. Seems to be stable at this time 2. Cont namenda per home regimen 3. Pt's wife reports allergy to Aricept  7. Hx subdural hematoma 1. Seems to be stable currently 8. Hx skin cancer 1. Pt is now s/p radiation tx this past spring. Followed by Oncology at [facility].  2. Pt has residual ulcer on side of neck and involvement in L ear, both stable  DVT prophylaxis: Lovenox subq  Code Status: DNR, confirmed with patient's wife at bedside Family Communication: Pt in room, pt's wife at bedside  Disposition Plan: Uncertain at this time  Consults called:  Admission status: Inpatient, as would likely require greater than 2 midnight stay for IVF hydration for dehydration and ARF and IV abx for UTI in elderly patient  Marylu Lund MD [facility] Pager On Amion  If 7PM-7AM, please contact night-coverage  12/28/2019, 3:09 PM

## 2019-12-28 NOTE — ED Notes (Signed)
Report to floor. Transfer called

## 2019-12-28 NOTE — ED Triage Notes (Signed)
Per wife, states he fell a couple of times over the weekend-took him to PCP and they did lab work and a UA-stated he had a UTI and was dehydrated-placed on Cipro-wife states poor fluid intake-states PCP wanted him to come to ED for fluids

## 2019-12-29 ENCOUNTER — Inpatient Hospital Stay (HOSPITAL_COMMUNITY): Payer: Medicare Other

## 2019-12-29 ENCOUNTER — Encounter (HOSPITAL_COMMUNITY): Payer: Self-pay | Admitting: Internal Medicine

## 2019-12-29 DIAGNOSIS — E860 Dehydration: Principal | ICD-10-CM

## 2019-12-29 LAB — COMPREHENSIVE METABOLIC PANEL
ALT: 11 U/L (ref 0–44)
AST: 12 U/L — ABNORMAL LOW (ref 15–41)
Albumin: 2.7 g/dL — ABNORMAL LOW (ref 3.5–5.0)
Alkaline Phosphatase: 64 U/L (ref 38–126)
Anion gap: 12 (ref 5–15)
BUN: 32 mg/dL — ABNORMAL HIGH (ref 8–23)
CO2: 23 mmol/L (ref 22–32)
Calcium: 8.4 mg/dL — ABNORMAL LOW (ref 8.9–10.3)
Chloride: 99 mmol/L (ref 98–111)
Creatinine, Ser: 1.44 mg/dL — ABNORMAL HIGH (ref 0.61–1.24)
GFR calc Af Amer: 54 mL/min — ABNORMAL LOW (ref >60)
GFR calc non Af Amer: 47 mL/min — ABNORMAL LOW (ref >60)
Glucose, Bld: 247 mg/dL — ABNORMAL HIGH (ref 70–99)
Potassium: 3.3 mmol/L — ABNORMAL LOW (ref 3.5–5.1)
Sodium: 134 mmol/L — ABNORMAL LOW (ref 135–145)
Total Bilirubin: 0.9 mg/dL (ref 0.3–1.2)
Total Protein: 5.8 g/dL — ABNORMAL LOW (ref 6.5–8.1)

## 2019-12-29 LAB — CBC
HCT: 26.6 % — ABNORMAL LOW (ref 39.0–52.0)
Hemoglobin: 8.9 g/dL — ABNORMAL LOW (ref 13.0–17.0)
MCH: 28.1 pg (ref 26.0–34.0)
MCHC: 33.5 g/dL (ref 30.0–36.0)
MCV: 83.9 fL (ref 80.0–100.0)
Platelets: 227 10*3/uL (ref 150–400)
RBC: 3.17 MIL/uL — ABNORMAL LOW (ref 4.22–5.81)
RDW: 12.7 % (ref 11.5–15.5)
WBC: 7.1 10*3/uL (ref 4.0–10.5)
nRBC: 0 % (ref 0.0–0.2)

## 2019-12-29 LAB — URINE CULTURE

## 2019-12-29 LAB — GLUCOSE, CAPILLARY
Glucose-Capillary: 274 mg/dL — ABNORMAL HIGH (ref 70–99)
Glucose-Capillary: 139 mg/dL — ABNORMAL HIGH (ref 70–99)
Glucose-Capillary: 118 mg/dL — ABNORMAL HIGH (ref 70–99)
Glucose-Capillary: 245 mg/dL — ABNORMAL HIGH (ref 70–99)

## 2019-12-29 MED ORDER — POTASSIUM CHLORIDE CRYS ER 20 MEQ PO TBCR
40.0000 meq | EXTENDED_RELEASE_TABLET | Freq: Once | ORAL | Status: AC
  Administered 2019-12-29: 40 meq via ORAL
  Filled 2019-12-29: qty 2

## 2019-12-29 MED ORDER — INSULIN DETEMIR 100 UNIT/ML ~~LOC~~ SOLN
5.0000 [IU] | Freq: Two times a day (BID) | SUBCUTANEOUS | Status: DC
  Administered 2019-12-29 – 2019-12-30 (×2): 5 [IU] via SUBCUTANEOUS
  Filled 2019-12-29 (×3): qty 0.05

## 2019-12-29 NOTE — Consult Note (Signed)
Urology Consult  Referring physician: Dr. Florene Glen Reason for referral: right hydronephrosis with right ureteral stent in place  Chief Complaint: fatigue  History of Present Illness: Mr [name] [name] is a 79yo with a hx of DMII, HLD, HTN, and bladder cancer who was admitted with progressive fatigue and confusion. Creatinine on admission was elevated at 1.78, UA was concerning for infection. He underwent Renal US which showed right hydronephrosis and the right ureteral stent was no clearly visualized in the renal pelvis. A followup CT stone study showed the right ureteral stent coiled in the pelvis/proximal ureter with mild hydronephrosis. Patient denies any flank pain. No worsening LUTS. No worsening LUTS. Urine culture grew multiple species His stent was last exchanged in 10/2019. Creatinine 1.4 today  Past Medical History:  Diagnosis Date  . Alzheimer disease ([place])   . Alzheimer's disease ([place])   . Bilateral cold feet   . Bladder cancer ([place])    dx in 2013.07/08/19 skin CA in neck  . BPH (benign prostatic hypertrophy)   . Dementia ([place]) 12-11-11   memory short term(steadily progressive worsening-pt. unaware) .dx. Alzheimers  . Diabetes mellitus 12-11-11   oral meds only type II   . 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 Hard of hearing   . Hearing loss    has one hearing aid, doesn't wear  . History of chemotherapy   . History of gallstones 2005  . History of kidney stones   . Hx of nonmelanoma skin cancer   . Hypercholesterolemia   . Hypertension 12-11-11   controlled with meds  . Increased frequency of urination    wife reports urinary frequency has not improved since last surgery   . Personal history of radiation exposure   . Pulmonary nodules    Bilateral  . Pyelonephritis    hospital admission july 2020  . Renal calculus, left 10/12/2013  . Urinary urgency   . UTI (urinary tract infection) 08-09-13   multiple, last tx. 1 week ago   Past Surgical  History:  Procedure Laterality Date  . BLADDER SURGERY  12-11-11   2007-tumor removal -stent placed and removed.Lavell Islam  '08 for stone  . CATARACT EXTRACTION, BILATERAL     bil.  LEFT EYE 08-20-09    RT EYE 08-27-09  . COLONOSCOPY    . CYSTOSCOPY  05-16-2007  . cystoscopy  10-09-2008  . CYSTOSCOPY N/A 11/18/2015   Procedure: CYSTOSCOPY;  Surgeon: Raynelle Bring, MD;  [facility];  Service: Urology;  Laterality: N/A;  . CYSTOSCOPY N/A 10/19/2016   Procedure: CYSTOSCOPY;  Surgeon: Raynelle Bring, MD;  [facility];  Service: Urology;  Laterality: N/A;  . CYSTOSCOPY N/A 11/11/2017   Procedure: BLUE LIGHT CYSTOSCOPY WITH CYSVIEW;  Surgeon: Raynelle Bring, MD;  [facility];  Service: Urology;  Laterality: N/A;  . CYSTOSCOPY W/ RETROGRADES  12/21/2011   Procedure: CYSTOSCOPY WITH RETROGRADE PYELOGRAM;  Surgeon: [name], MD;  [facility];  Service: Urology;  Laterality: Bilateral;  . CYSTOSCOPY W/ RETROGRADES Bilateral 08/14/2013   Procedure: CYSTOSCOPY WITH BILATERAL  RETROGRADE PYELOGRAM/LEFT DIGITAL FLEXIBLE URETEROSCOPY/INSERTION LEFT URETERAL STENT;  Surgeon: [name], MD;  [facility];  Service: Urology;  Laterality: Bilateral;  . CYSTOSCOPY W/ RETROGRADES Bilateral 07/23/2014   Procedure: CYSTOSCOPY WITH RETROGRADE PYELOGRAM;  Surgeon: Raynelle Bring, MD;  [facility];  Service: Urology;  Laterality: Bilateral;  . CYSTOSCOPY W/ RETROGRADES Bilateral 05/13/2015   Procedure: CYSTOSCOPY WITH RETROGRADE PYELOGRAM;  Surgeon: Raynelle Bring, MD;  [facility];  Service: Urology;  Laterality: Bilateral;  . CYSTOSCOPY W/ RETROGRADES Bilateral 03/02/2016   Procedure: CYSTOSCOPY, EXAM UNDER ANESTHESIA;  Surgeon: Raynelle Bring, MD;  [facility];  Service: Urology;  Laterality: Bilateral;  . CYSTOSCOPY W/ RETROGRADES N/A 09/28/2016   Procedure: CYSTOSCOPY WITH RETROGRADE PYELOGRAM;  Surgeon: Raynelle Bring, MD;  [facility];  Service: Urology;  Laterality: N/A;  . CYSTOSCOPY W/  URETERAL STENT PLACEMENT Right 05/08/2019   Procedure: CYSTOSCOPY WITH RIGHT STENT CHANGE;  Surgeon: Raynelle Bring, MD;  [facility];  Service: Urology;  Laterality: Right;  . CYSTOSCOPY WITH BIOPSY  12/21/2011   Procedure: CYSTOSCOPY WITH BIOPSY;  Surgeon: [name], MD;  [facility];  Service: Urology;;  . Consuela Mimes WITH BIOPSY N/A 08/29/2018   Procedure: CYSTOSCOPY WITH BIOPSY OF BLADDER TUMOR/ TRANSURETHRAL RESECTION OF BLADDER TUMOR/ RIGHT RETROGRADE PYELOGRAM/RIGHT STENT PLACEMENT;  Surgeon: Raynelle Bring, MD;  [facility];  Service: Urology;  Laterality: N/A;  . CYSTOSCOPY WITH STENT PLACEMENT Right 01/26/2019   Procedure: CYSTOSCOPY WITH STENT EXCHANGE;  Surgeon: Raynelle Bring, MD;  [facility];  Service: Urology;  Laterality: Right;  . CYSTOSCOPY WITH STENT PLACEMENT Right 08/07/2019   Procedure: CYSTOSCOPY WITH STENT EXCHANGE;  Surgeon: Raynelle Bring, MD;  [facility];  Service: Urology;  Laterality: Right;  . CYSTOSCOPY WITH STENT PLACEMENT Right 10/30/2019   Procedure: CYSTOSCOPY WITH STENT CHANGE;  Surgeon: Raynelle Bring, MD;  [facility];  Service: Urology;  Laterality: Right;  . EXTRACORPOREAL SHOCK WAVE LITHOTRIPSY  2005  . EYE SURGERY    . LITHOTRIPSY  12-11-11   '05/ '10  . mose procedure  Left 11-12-15 and feb 2017  . NECK DISSECTION Left 06/12/2019   Procedure: neck dissection; Surgeon: Theodoro Kalata, MD; [facility] MA[facility]; Service: ENT; Laterality: N/A;    . NEPHROLITHOTOMY Left 10/12/2013   Procedure: NEPHROLITHOTOMY PERCUTANEOUS;  Surgeon: [name], MD;  [facility];  Service: Urology;  Laterality: Left;  . PAROTIDECTOMY Left 06/12/2019   Procedure: PAROTIDECTOMY; Surgeon: Theodoro Kalata, MD; Location[facility]; Service: ENT; Laterality: Left;    . stent to kidney  04-29-2007  . SUBDURAL HEMORRHAGE DRAINAGE     drainage per boreholes only 12-12-03  . TONSILLECTOMY  12-11-11   child  . TRANSURETHRAL RESECTION  OF BLADDER TUMOR  12/21/2011   Procedure: TRANSURETHRAL RESECTION OF BLADDER TUMOR (TURBT);  Surgeon: [name], MD;  [facility];  Service: Urology;  Laterality: N/A;     . TRANSURETHRAL RESECTION OF BLADDER TUMOR N/A 08/14/2013   Procedure: TRANSURETHRAL RESECTION OF BLADDER TUMOR (TURBT);  Surgeon: [name], MD;  [facility];  Service: Urology;  Laterality: N/A;  . TRANSURETHRAL RESECTION OF BLADDER TUMOR N/A 07/23/2014   Procedure: TRANSURETHRAL RESECTION OF BLADDER TUMOR (TURBT) WITH MITOMYCIN INSTILLATION;  Surgeon: Raynelle Bring, MD;  [facility];  Service: Urology;  Laterality: N/A;  . TRANSURETHRAL RESECTION OF BLADDER TUMOR N/A 05/13/2015   Procedure: TRANSURETHRAL RESECTION OF BLADDER TUMOR (TURBT);  Surgeon: Raynelle Bring, MD;  [facility];  Service: Urology;  Laterality: N/A;  . TRANSURETHRAL RESECTION OF BLADDER TUMOR N/A 11/18/2015   Procedure: TRANSURETHRAL RESECTION OF BLADDER TUMOR (TURBT);  Surgeon: Raynelle Bring, MD;  [facility];  Service: Urology;  Laterality: N/A;  . TRANSURETHRAL RESECTION OF BLADDER TUMOR N/A 03/02/2016   Procedure: TRANSURETHRAL RESECTION OF BLADDER TUMOR (TURBT);  Surgeon: Raynelle Bring, MD;  [facility];  Service: Urology;  Laterality: N/A;  . TRANSURETHRAL RESECTION  OF BLADDER TUMOR N/A 09/28/2016   Procedure: TRANSURETHRAL RESECTION OF BLADDER TUMOR (TURBT);  Surgeon: Raynelle Bring, MD;  [facility];  Service: Urology;  Laterality: N/A;  . TRANSURETHRAL RESECTION OF BLADDER TUMOR N/A 10/19/2016   Procedure: TRANSURETHRAL RESECTION OF BLADDER TUMOR (TURBT);  Surgeon: Raynelle Bring, MD;  [facility];  Service: Urology;  Laterality: N/A;  NEEDS 30 MIN TOTAL FOR BOTH PROCEDURES  . TRANSURETHRAL RESECTION OF BLADDER TUMOR N/A 11/11/2017   Procedure: TRANSURETHRAL RESECTION OF BLADDER TUMOR (TURBT) WITH CYSTOSCOPY;  Surgeon: Raynelle Bring, MD;  [facility];  Service: Urology;  Laterality: N/A;  . TRANSURETHRAL RESECTION  OF BLADDER TUMOR N/A 10/17/2018   Procedure: TRANSURETHRAL RESECTION OF BLADDER TUMOR (TURBT) WITH CYSTOSCOPY, RIGHT URETEROSCOPY, RIGHT URETERAL STENT CHANGE;  Surgeon: Raynelle Bring, MD;  [facility];  Service: Urology;  Laterality: N/A;  GENERAL ANESTHESIA WITH POSSIBLE PARALYSIS  . UPPER GI ENDOSCOPY      Medications: I have reviewed the patient's current medications. Allergies:  Allergies  Allergen Reactions  . Sulfa Antibiotics Hives  . Tetracyclines & Related Rash  . Aricept [Donepezil Hcl] Nausea And Vomiting    Family History  Problem Relation Age of Onset  . Breast cancer Sister   . Colon cancer Brother 63   Social History:  reports that he has never smoked. He has never used smokeless tobacco. He reports that he does not drink alcohol and does not use drugs.  Review of Systems  Constitutional: Positive for fatigue.  Genitourinary: Positive for frequency.  All other systems reviewed and are negative.   Physical Exam:  Vital signs in last 24 hours: Temp:  [97.8 F (36.6 C)-98.6 F (37 C)] 97.8 F (36.6 C) (07/16 1405) Pulse Rate:  [78-108] 78 (07/16 1405) Resp:  [18-19] 19 (07/16 0455) BP: (96-134)/(63-86) 96/67 (07/16 1405) SpO2:  [97 %-100 %] 98 % (07/16 1458) Physical Exam Vitals reviewed.  Constitutional:      Appearance: Normal appearance.  HENT:     Head: Normocephalic and atraumatic.     Nose: Nose normal.  Eyes:     Extraocular Movements: Extraocular movements intact.     Pupils: Pupils are equal, round, and reactive to light.  Cardiovascular:     Rate and Rhythm: Normal rate and regular rhythm.  Pulmonary:     Effort: Pulmonary effort is normal. No respiratory distress.  Abdominal:     General: Abdomen is flat. There is no distension.  Musculoskeletal:        General: No swelling. Normal range of motion.     Cervical back: Normal range of motion and neck supple.  Skin:    General: Skin is warm and dry.  Neurological:     General: No  focal deficit present.     Mental Status: He is alert. Mental status is at baseline.  Psychiatric:        Mood and Affect: Mood normal.     Laboratory Data:  Results for orders placed or performed during the hospital encounter of 12/28/19 (from the past 72 hour(s))  Basic metabolic panel     Status: Abnormal   Collection Time: 12/28/19 12:18 PM  Result Value Ref Range   Sodium 134 (L) 135 - 145 mmol/L   Potassium 4.0 3.5 - 5.1 mmol/L   Chloride 91 (L) 98 - 111 mmol/L   CO2 29 22 - 32 mmol/L   Glucose, Bld 375 (H) 70 - 99 mg/dL    Comment: Glucose reference range applies only  to samples taken after fasting for at least 8 hours.   BUN 36 (H) 8 - 23 mg/dL   Creatinine, Ser 1.78 (H) 0.61 - 1.24 mg/dL   Calcium 8.9 8.9 - 10.3 mg/dL   GFR calc non Af Amer 36 (L) >60 mL/min   GFR calc Af Amer 42 (L) >60 mL/min   Anion gap 14 5 - 15    Comment: Performed at [facility], [place] [address], [place], [place] [address]  CBC     Status: Abnormal   Collection Time: 12/28/19 12:18 PM  Result Value Ref Range   WBC 8.6 4.0 - 10.5 K/uL   RBC 3.27 (L) 4.22 - 5.81 MIL/uL   Hemoglobin 9.4 (L) 13.0 - 17.0 g/dL   HCT 27.8 (L) 39 - 52 %   MCV 85.0 80.0 - 100.0 fL   MCH 28.7 26.0 - 34.0 pg   MCHC 33.8 30.0 - 36.0 g/dL   RDW 12.9 11.5 - 15.5 %   Platelets 208 150 - 400 K/uL   nRBC 0.0 0.0 - 0.2 %    Comment: Performed at [facility], [place] [address], [place], [place] [address]  TSH     Status: None   Collection Time: 12/28/19 12:18 PM  Result Value Ref Range   TSH 0.573 0.350 - 4.500 uIU/mL    Comment: Performed by a 3rd Generation assay with a functional sensitivity of <=0.01 uIU/mL. Performed at [facility], [place] [address], [place], [place] [address]   Hemoglobin A1c     Status: Abnormal   Collection Time: 12/28/19 12:18 PM  Result Value Ref Range   Hgb A1c MFr Bld 8.7 (H) 4.8 - 5.6 %    Comment: (NOTE) Pre diabetes:           5.7%-6.4%  Diabetes:              >6.4%  Glycemic control for   <7.0% adults with diabetes    Mean Plasma Glucose 202.99 mg/dL    Comment: Performed at [facility], [place] [address], [place], [place] [address]  Urinalysis, Routine w reflex microscopic- may I&O cath if menses     Status: Abnormal   Collection Time: 12/28/19 12:55 PM  Result Value Ref Range   Color, Urine YELLOW YELLOW   APPearance TURBID (A) CLEAR   Specific Gravity, Urine 1.013 1.005 - 1.030   pH 5.0 5.0 - 8.0   Glucose, UA >=500 (A) NEGATIVE mg/dL   Hgb urine dipstick SMALL (A) NEGATIVE   Bilirubin Urine NEGATIVE NEGATIVE   Ketones, ur NEGATIVE NEGATIVE mg/dL   Protein, ur 100 (A) NEGATIVE mg/dL   Nitrite NEGATIVE NEGATIVE   Leukocytes,Ua LARGE (A) NEGATIVE   RBC / HPF 6-10 0 - 5 RBC/hpf   WBC, UA >50 (H) 0 - 5 WBC/hpf   Bacteria, UA RARE (A) NONE SEEN   Squamous Epithelial / LPF 0-5 0 - 5   WBC Clumps PRESENT     Comment: Performed at [facility], [place] [address] [place], [place] [address]  Urine Culture     Status: Abnormal   Collection Time: 12/28/19 12:55 PM   Specimen: Urine, Random  Result Value Ref Range   Specimen Description      URINE, RANDOM Performed at [place] [address], [place],  [address]    Special Requests      NONE Performed at [facility], [address] [place], [place] [address]    Culture MULTIPLE SPECIES PRESENT, SUGGEST RECOLLECTION (A)    Report Status 12/29/2019 FINAL   SARS Coronavirus 2 by RT PCR (hospital order, performed in [facility] hospital lab) Nasopharyngeal Nasopharyngeal Swab     Status: None   Collection Time: 12/28/19  2:31 PM   Specimen: Nasopharyngeal Swab  Result Value Ref Range   SARS Coronavirus 2 NEGATIVE NEGATIVE    Comment: (NOTE) SARS-CoV-2 target nucleic acids are NOT DETECTED.  The SARS-CoV-2 RNA is generally detectable in upper and lower respiratory specimens  during the acute phase of infection. The lowest concentration of SARS-CoV-2 viral copies this assay can detect is 250 copies / mL. A negative result does not preclude SARS-CoV-2 infection and should not be used as the sole basis for treatment or other patient management decisions.  A negative result may occur with improper specimen collection / handling, submission of specimen other than nasopharyngeal swab, presence of viral mutation(s) within the areas targeted by this assay, and inadequate number of viral copies (<250 copies / mL). A negative result must be combined with clinical observations, patient history, and epidemiological information.  Fact Sheet for Patients:   StrictlyIdeas.no  Fact Sheet for Healthcare Providers: BankingDealers.co.za  This test is not yet approved or  cleared by the [place] FDA and has been authorized for detection and/or diagnosis of SARS-CoV-2 by FDA under an Emergency Use Authorization (EUA).  This EUA will remain in effect (meaning this test can be used) for the duration of the COVID-19 declaration under Section 564(b)(1) of the Act, 21 U.S.C. section 360bbb-3(b)(1), unless the authorization is terminated or revoked sooner.  Performed at [facility], [place] [address], [place], [place] [address]   CBG monitoring, ED     Status: Abnormal   Collection Time: 12/28/19  4:55 PM  Result Value Ref Range   Glucose-Capillary 292 (H) 70 - 99 mg/dL    Comment: Glucose reference range applies only to samples taken after fasting for at least 8 hours.  Glucose, capillary     Status: Abnormal   Collection Time: 12/28/19 10:02 PM  Result Value Ref Range   Glucose-Capillary 311 (H) 70 - 99 mg/dL    Comment: Glucose reference range applies only to samples taken after fasting for at least 8 hours.  Comprehensive metabolic panel     Status: Abnormal   Collection Time: 12/29/19  4:47 AM  Result  Value Ref Range   Sodium 134 (L) 135 - 145 mmol/L   Potassium 3.3 (L) 3.5 - 5.1 mmol/L    Comment: DELTA CHECK NOTED   Chloride 99 98 - 111 mmol/L   CO2 23 22 - 32 mmol/L   Glucose, Bld 247 (H) 70 - 99 mg/dL    Comment: Glucose reference range applies only to samples taken after fasting for at least 8 hours.   BUN 32 (H) 8 - 23 mg/dL   Creatinine, Ser 1.44 (H) 0.61 - 1.24 mg/dL   Calcium 8.4 (L) 8.9 - 10.3 mg/dL   Total Protein 5.8 (L) 6.5 - 8.1 g/dL   Albumin 2.7 (L) 3.5 - 5.0 g/dL   AST 12 (L) 15 - 41 U/L   ALT 11 0 - 44 U/L   Alkaline Phosphatase 64 38 - 126 U/L   Total Bilirubin 0.9 0.3 - 1.2 mg/dL   GFR calc non Af Amer 47 (L) >60 mL/min   GFR calc Af Amer 54 (L) >60 mL/min   Anion gap 12  5 - 15    Comment: Performed at [facility], [place] [address], [place], [place] [address]  CBC     Status: Abnormal   Collection Time: 12/29/19  4:47 AM  Result Value Ref Range   WBC 7.1 4.0 - 10.5 K/uL   RBC 3.17 (L) 4.22 - 5.81 MIL/uL   Hemoglobin 8.9 (L) 13.0 - 17.0 g/dL   HCT 26.6 (L) 39 - 52 %   MCV 83.9 80.0 - 100.0 fL   MCH 28.1 26.0 - 34.0 pg   MCHC 33.5 30.0 - 36.0 g/dL   RDW 12.7 11.5 - 15.5 %   Platelets 227 150 - 400 K/uL   nRBC 0.0 0.0 - 0.2 %    Comment: Performed at [facility], [place] [address], [place], [place] [address]  Glucose, capillary     Status: Abnormal   Collection Time: 12/29/19  8:08 AM  Result Value Ref Range   Glucose-Capillary 274 (H) 70 - 99 mg/dL    Comment: Glucose reference range applies only to samples taken after fasting for at least 8 hours.  Glucose, capillary     Status: Abnormal   Collection Time: 12/29/19 11:27 AM  Result Value Ref Range   Glucose-Capillary 139 (H) 70 - 99 mg/dL    Comment: Glucose reference range applies only to samples taken after fasting for at least 8 hours.  Glucose, capillary     Status: Abnormal   Collection Time: 12/29/19  4:39 PM  Result Value Ref Range   Glucose-Capillary 118  (H) 70 - 99 mg/dL    Comment: Glucose reference range applies only to samples taken after fasting for at least 8 hours.   Recent Results (from the past 240 hour(s))  Urine Culture     Status: Abnormal   Collection Time: 12/28/19 12:55 PM   Specimen: Urine, Random  Result Value Ref Range Status   Specimen Description   Final    URINE, RANDOM Performed at [place] [address], [place], [place] [address]    Special Requests   Final    NONE Performed at [facility], [place] [address] [place],  [address]    Culture MULTIPLE SPECIES PRESENT, SUGGEST RECOLLECTION (A)  Final   Report Status 12/29/2019 FINAL  Final  SARS Coronavirus 2 by RT PCR (hospital order, performed in [facility] hospital lab) Nasopharyngeal Nasopharyngeal Swab     Status: None   Collection Time: 12/28/19  2:31 PM   Specimen: Nasopharyngeal Swab  Result Value Ref Range Status   SARS Coronavirus 2 NEGATIVE NEGATIVE Final    Comment: (NOTE) SARS-CoV-2 target nucleic acids are NOT DETECTED.  The SARS-CoV-2 RNA is generally detectable in upper and lower respiratory specimens during the acute phase of infection. The lowest concentration of SARS-CoV-2 viral copies this assay can detect is 250 copies / mL. A negative result does not preclude SARS-CoV-2 infection and should not be used as the sole basis for treatment or other patient management decisions.  A negative result may occur with improper specimen collection / handling, submission of specimen other than nasopharyngeal swab, presence of viral mutation(s) within the areas targeted by this assay, and inadequate number of viral copies (<250 copies / mL). A negative result must be combined with clinical observations, patient history, and epidemiological information.  Fact Sheet for Patients:   StrictlyIdeas.no  Fact Sheet for Healthcare  Providers: BankingDealers.co.za  This test is not yet approved or  cleared by  the [employer] and has been authorized for detection and/or diagnosis of SARS-CoV-2 by FDA under an Emergency Use Authorization (EUA).  This EUA will remain in effect (meaning this test can be used) for the duration of the COVID-19 declaration under Section 564(b)(1) of the Act, 21 U.S.C. section 360bbb-3(b)(1), unless the authorization is terminated or revoked sooner.  Performed at [facility], [place] [address] [place], [place] [address]    Creatinine: Recent Labs    12/28/19 1218 12/29/19 0447  CREATININE 1.78* 1.44*   Baseline Creatinine: 1.3  Impression/Assessment:  78yo with hx of bladder cancer, right ureteral stent with right hydronephrosis, UTI  Plan:  1. Right ureteral stent with hydronephrosis: The right ureteral stent is in good position and the hydronephrosis is stable from his prior CT in 06/2019. He does not require stent exchange at this time. 2. Bladder cancer: Management per Dr. Alen Blew and Dr. Alinda Money as an outpatient 3. UTI: agree with cefdinir for 5 days  Nicolette Bang 12/29/2019, 8:11 PM

## 2019-12-29 NOTE — Progress Notes (Signed)
PROGRESS NOTE    [name] [name]  ERX:[id number] DOB: January 14, 1941 DOA: 12/28/2019 PCP: [name], MD   Chief Complaint  Patient presents with  . Urinary Tract Infection    Brief Narrative:  [name] [name] is [name] [name] 79 y.o. male with medical history significant of dementia, bladder cancer, skin cancer s/p radiation, DM2, HLD, HTN, who presents to the ED with progressive weakness and multiple falls. Pt has advanced dementia, thus history cannot be obtained from patient. Per pt's wife, pt has not been taking in much PO over the past week with progressive decline. Two days prior to admit, pt had multiple falls resulting in superficial injuries to elbows. Given concerns of dehydration, pt was brought into ED for further evaluation  ED Course: In the ED pt was found to have BUN/Cr of 36/1.7, respectively. Glucose noted  tobe 375. Hgb 9.4 with WBC of 8.6. UA was suggestive of UTI and pt started on empiric rocephin in ED. Of note, UA was also notable for >500 glucose with no ketones. Anion gap 14. Hospitalist consulted for consideration for admission.  Assessment & Plan:   Principal Problem:   Dehydration Active Problems:   Essential hypertension   Type 2 diabetes mellitus without complication (HCC)   Acute lower UTI   Dementia without behavioral disturbance (HCC)   History of subdural hematoma   Cancer of skin of neck   DNR (do not resuscitate)  1. Dehydration with falls 1. Poor PO intake per family over the past week with multiple falls noted over past 2 days prior to admit. Superficial injuries over B elbows. 2. Will order B elbow xrays to r/o underlying fracture - these have not been done yet, nursing to follow up with rads 3. Mucus membranes are dry with poor skin turgor 4. Cr up to 1.78 with BUN 36 5. Will continue with gentle IVF hydration 6. Repeat bmet in AM  2. AKI  Moderate R Hydro  S/p R Ureteral Stent Placement for R ureteral Obstruction 1. Cr up to 1.78 on  presentation, likely secondary to dehydration 2. Improved to 1.4 today (1.14 at baseline)  3. Renal US with moderate R hydro, stent within the urinary bladder emanating from R ureterovesicular junction 4. Follow CT stone study 5. Urology c/s, appreciate recommendations 6. Continue IVF per above 7. Repeat bmet in AM  3. UTI 1. UA suggestive of UTI, wife reports worse mentation recently 2. Urine cx with multiple species 3. Continue ceftriaxone  4. Urology c/s as above with moderate hydro   4. HTN 1. BP stable at this time 2. Cont metoprolol, holding diuretic given dehydration  5. DM2, uncontrolled 1. >500 glucose on UA 2. Start 5 units levemir BID and follow BG's closely  3. Continue SSI   6. Dementia 1. Seems to be stable at this time 2. Cont namenda per home regimen 3. Pt's wife reports allergy to Aricept  4. Delirium precautions  7. Hypokalemia: replace and follow  8. Hx subdural hematoma 1. Seems to be stable currently  9. Hx skin cancer 1. Pt is now s/p radiation tx this past spring. Followed by Oncology at [facility].  2. Pt has residual ulcer on side of neck and involvement in L ear, both stable   DVT prophylaxis: lovenox Code Status: DNR Family Communication: wife over phon Disposition:   Status is: Inpatient  Remains inpatient appropriate because:Inpatient level of care appropriate due to severity of illness   Dispo: The patient is from: Home  Anticipated d/c is to: Home              Anticipated d/c date is: 2 days              Patient currently is not medically stable to d/c.   Consultants:   urology  Procedures:   none  Antimicrobials:  Anti-infectives (From admission, onward)   Start     Dose/Rate Route Frequency Ordered Stop   12/29/19 0600  cefTRIAXone (ROCEPHIN) 2 g in sodium chloride 0.9 % 100 mL IVPB     Discontinue     2 g 200 mL/hr over 30 Minutes Intravenous Every 24 hours 12/28/19 2325     12/28/19 1415  cefTRIAXone  (ROCEPHIN) 1 g in sodium chloride 0.9 % 100 mL IVPB        1 g 200 mL/hr over 30 Minutes Intravenous  Once 12/28/19 1400 12/28/19 1502      Subjective: No new complaints  Objective: Vitals:   12/29/19 0455 12/29/19 0847 12/29/19 1405 12/29/19 1458  BP: 134/86 130/69 96/67   Pulse: 90 94 78   Resp: 19     Temp: 98.1 F (36.7 C)  97.8 F (36.6 C)   TempSrc: Oral     SpO2: 100%  97% 98%    Intake/Output Summary (Last 24 hours) at 12/29/2019 1641 Last data filed at 12/29/2019 1430 Gross per 24 hour  Intake 1105.16 ml  Output 500 ml  Net 605.16 ml   There were no vitals filed for this visit.  Examination:  General exam: Appears calm and comfortable  Respiratory system: Clear to auscultation. Respiratory effort normal. Cardiovascular system: S1 & S2 heard, RRR.  Gastrointestinal system: Abdomen is nondistended, soft and nontender.  Central nervous system: Alert and oriented. No focal neurological deficits.  Hard of hearing Extremities: no LEE Skin: No rashes, lesions or ulcers Psychiatry: Judgement and insight appear normal. Mood & affect appropriate.     Data Reviewed: I have personally reviewed following labs and imaging studies  CBC: Recent Labs  Lab 12/28/19 1218 12/29/19 0447  WBC 8.6 7.1  HGB 9.4* 8.9*  HCT 27.8* 26.6*  MCV 85.0 83.9  PLT 208 993    Basic Metabolic Panel: Recent Labs  Lab 12/28/19 1218 12/29/19 0447  NA 134* 134*  K 4.0 3.3*  CL 91* 99  CO2 29 23  GLUCOSE 375* 247*  BUN 36* 32*  CREATININE 1.78* 1.44*  CALCIUM 8.9 8.4*    GFR: CrCl cannot be calculated (Unknown ideal weight.).  Liver Function Tests: Recent Labs  Lab 12/29/19 0447  AST 12*  ALT 11  ALKPHOS 64  BILITOT 0.9  PROT 5.8*  ALBUMIN 2.7*    CBG: Recent Labs  Lab 12/28/19 1655 12/28/19 2202 12/29/19 0808 12/29/19 1127  GLUCAP 292* 311* 274* 139*     Recent Results (from the past 240 hour(s))  Urine Culture     Status: Abnormal   Collection Time:  12/28/19 12:55 PM   Specimen: Urine, Random  Result Value Ref Range Status   Specimen Description   Final    URINE, RANDOM Performed at [facility] [address], [place], [place] [address]    Special Requests   Final    NONE Performed at [facility], [place] [address], [place], [place] [address]    Culture MULTIPLE SPECIES PRESENT, SUGGEST RECOLLECTION (Dereonna Lensing)  Final   Report Status 12/29/2019 FINAL  Final  SARS Coronavirus 2 by RT PCR (hospital order,  performed in [facility] hospital lab) Nasopharyngeal Nasopharyngeal Swab     Status: None   Collection Time: 12/28/19  2:31 PM   Specimen: Nasopharyngeal Swab  Result Value Ref Range Status   SARS Coronavirus 2 NEGATIVE NEGATIVE Final    Comment: (NOTE) SARS-CoV-2 target nucleic acids are NOT DETECTED.  The SARS-CoV-2 RNA is generally detectable in upper and lower respiratory specimens during the acute phase of infection. The lowest concentration of SARS-CoV-2 viral copies this assay can detect is 250 copies / mL. Leiann Sporer negative result does not preclude SARS-CoV-2 infection and should not be used as the sole basis for treatment or other patient management decisions.  Adaora Mchaney negative result may occur with improper specimen collection / handling, submission of specimen other than nasopharyngeal swab, presence of viral mutation(s) within the areas targeted by this assay, and inadequate number of viral copies (<250 copies / mL). Nasteho Glantz negative result must be combined with clinical observations, patient history, and epidemiological information.  Fact Sheet for Patients:   StrictlyIdeas.no  Fact Sheet for Healthcare Providers: BankingDealers.co.za  This test is not yet approved or  cleared by the [place] FDA and has been authorized for detection and/or diagnosis of SARS-CoV-2 by FDA under an Emergency Use Authorization (EUA).  This EUA will remain in effect  (meaning this test can be used) for the duration of the COVID-19 declaration under Section 564(b)(1) of the Act, 21 U.S.C. section 360bbb-3(b)(1), unless the authorization is terminated or revoked sooner.  Performed at [facility], [place] [address], [place], [place] [address]          Radiology Studies: US RENAL  Result Date: 12/29/2019 CLINICAL DATA:  Acute kidney injury, history of TURBT, renal stent, nephrolithotomy, pyelonephritis EXAM: RENAL / URINARY TRACT ULTRASOUND COMPLETE COMPARISON:  CT abdomen pelvis, 07/13/2019 FINDINGS: Right Kidney: Renal measurements: 12.7 x 5.3 x 6.2 cm = volume: 220 mL. Increased cortical echogenicity. Moderate right hydronephrosis. Left Kidney: Renal measurements: 10.6 x 5.5 x 6.3 cm = volume: 190 mL. Increased cortical echogenicity. No mass or hydronephrosis visualized. Bladder: Debris within the urinary bladder. Stent is imaged within the urinary bladder emanating from the right ureterovesicular junction. Other: Incidental note of cholelithiasis. IMPRESSION: 1.  Moderate right hydronephrosis. 2. Roxann Vierra stent is imaged within the urinary bladder emanating from the right ureterovesicular junction, the proximal end of the stent is not noted in the right renal pelvis and may have passed into the proximal ureter. Consider CT to further evaluate for proper stent positioning. 3. Debris within the urinary bladder, suggestive of infectious or inflammatory cystitis. 4. Increased renal cortical echogenicity, in keeping with medical renal disease. 5.  Cholelithiasis. Electronically Signed   By: Eddie Candle M.D.   On: 12/29/2019 11:47        Scheduled Meds: . dutasteride  0.5 mg Oral Daily  . enoxaparin (LOVENOX) injection  40 mg Subcutaneous Q24H  . insulin aspart  0-15 Units Subcutaneous TID WC  . insulin aspart  0-5 Units Subcutaneous QHS  . memantine  28 mg Oral q morning - 10a  . metoprolol succinate  25 mg Oral Daily   Continuous  Infusions: . sodium chloride 75 mL/hr at 12/28/19 2257  . cefTRIAXone (ROCEPHIN)  IV 2 g (12/29/19 0543)     LOS: 1 day    Time spent: over 30 min    Fayrene Helper, MD [facility]   To contact the attending provider between 7A-7P or the covering provider during after hours 7P-7A, please log  into the web site [email or web address] and access using universal [place] password for that web site. If you do not have the password, please call the hospital operator.  12/29/2019, 4:41 PM

## 2019-12-29 NOTE — Care Management Important Message (Signed)
Important Message  Patient Details IM Letter presented to the Patient Name: [name] [name] MRN: [id number] Date of Birth: July 13, 1940   Medicare Important Message Given:  Yes     Kerin Salen 12/29/2019, 2:52 PM

## 2019-12-29 NOTE — Progress Notes (Signed)
Inpatient Diabetes Program Recommendations  AACE/ADA: New Consensus Statement on Inpatient Glycemic Control (2015)  Target Ranges:  Prepandial:   less than 140 mg/dL      Peak postprandial:   less than 180 mg/dL (1-2 hours)      Critically ill patients:  140 - 180 mg/dL   Lab Results  Component Value Date   GLUCAP 274 (H) 12/29/2019   HGBA1C 8.7 (H) 12/28/2019    Review of Glycemic Control  Diabetes history: type 2 Outpatient Diabetes medications: Humalog insulin (dosage?) TID if blood sugars >300 mg/dl, Januvia 100 mg daily, Metformin 750 mg daily Current orders for Inpatient glycemic control: Novolog MODERATE correction scale TID & HS scale  Inpatient Diabetes Program Recommendations:   Received diabetes coordinator consult. Noted that blood sugars have been greater than 180 mg/dl.    Recommend adding low dose basal insulin Lantus 10 units daily and continuing Novolog MODERATE correction scale TID & HS scale as ordered if blood sugars continue to be greater than 200 mg/dl.   Harvel Ricks RN BSN CDE Diabetes Coordinator Pager: [phone]  8am-5pm

## 2019-12-30 LAB — CBC WITH DIFFERENTIAL/PLATELET
Abs Immature Granulocytes: 0.11 10*3/uL — ABNORMAL HIGH (ref 0.00–0.07)
Basophils Absolute: 0 10*3/uL (ref 0.0–0.1)
Basophils Relative: 0 %
Eosinophils Absolute: 0.1 10*3/uL (ref 0.0–0.5)
Eosinophils Relative: 1 %
HCT: 28.1 % — ABNORMAL LOW (ref 39.0–52.0)
Hemoglobin: 9.1 g/dL — ABNORMAL LOW (ref 13.0–17.0)
Immature Granulocytes: 1 %
Lymphocytes Relative: 3 %
Lymphs Abs: 0.2 10*3/uL — ABNORMAL LOW (ref 0.7–4.0)
MCH: 27.7 pg (ref 26.0–34.0)
MCHC: 32.4 g/dL (ref 30.0–36.0)
MCV: 85.7 fL (ref 80.0–100.0)
Monocytes Absolute: 0.9 10*3/uL (ref 0.1–1.0)
Monocytes Relative: 12 %
Neutro Abs: 6.5 10*3/uL (ref 1.7–7.7)
Neutrophils Relative %: 83 %
Platelets: 243 10*3/uL (ref 150–400)
RBC: 3.28 MIL/uL — ABNORMAL LOW (ref 4.22–5.81)
RDW: 12.8 % (ref 11.5–15.5)
WBC: 7.8 10*3/uL (ref 4.0–10.5)
nRBC: 0 % (ref 0.0–0.2)

## 2019-12-30 LAB — COMPREHENSIVE METABOLIC PANEL
ALT: 8 U/L (ref 0–44)
AST: 14 U/L — ABNORMAL LOW (ref 15–41)
Albumin: 2.6 g/dL — ABNORMAL LOW (ref 3.5–5.0)
Alkaline Phosphatase: 60 U/L (ref 38–126)
Anion gap: 11 (ref 5–15)
BUN: 25 mg/dL — ABNORMAL HIGH (ref 8–23)
CO2: 25 mmol/L (ref 22–32)
Calcium: 8.5 mg/dL — ABNORMAL LOW (ref 8.9–10.3)
Chloride: 102 mmol/L (ref 98–111)
Creatinine, Ser: 1.4 mg/dL — ABNORMAL HIGH (ref 0.61–1.24)
GFR calc Af Amer: 56 mL/min — ABNORMAL LOW (ref >60)
GFR calc non Af Amer: 48 mL/min — ABNORMAL LOW (ref >60)
Glucose, Bld: 149 mg/dL — ABNORMAL HIGH (ref 70–99)
Potassium: 3.7 mmol/L (ref 3.5–5.1)
Sodium: 138 mmol/L (ref 135–145)
Total Bilirubin: 0.5 mg/dL (ref 0.3–1.2)
Total Protein: 5.9 g/dL — ABNORMAL LOW (ref 6.5–8.1)

## 2019-12-30 LAB — MAGNESIUM: Magnesium: 1.8 mg/dL (ref 1.7–2.4)

## 2019-12-30 LAB — PHOSPHORUS: Phosphorus: 2.6 mg/dL (ref 2.5–4.6)

## 2019-12-30 LAB — GLUCOSE, CAPILLARY
Glucose-Capillary: 133 mg/dL — ABNORMAL HIGH (ref 70–99)
Glucose-Capillary: 160 mg/dL — ABNORMAL HIGH (ref 70–99)

## 2019-12-30 MED ORDER — CEFDINIR 300 MG PO CAPS
300.0000 mg | ORAL_CAPSULE | Freq: Two times a day (BID) | ORAL | 0 refills | Status: AC
Start: 2019-12-30 — End: 2020-01-04

## 2019-12-30 NOTE — Progress Notes (Signed)
Physical Therapy Evaluation Patient Details Name: [name] [name] MRN: [id number] DOB: Jun 11, 1941 Today's Date: 12/30/2019   History of Present Illness  is a 79 y.o. male with medical history significant of dementia, bladder cancer, skin cancer s/p radiation, DM2, HLD, HTN, who presents to the ED with progressive weakness and multiple falls.  Two days prior to admit, pt had multiple falls resulting in superficial injuries to elbows. Given concerns of dehydration, pt was brought into ED for further evaluation and admitted for dehydration.   Clinical Impression  Pt admitted with above diagnosis. Pt requiring min guard-min A for transfers.  He was able to ambulate 300' with RW.  Pt with mild unsteadiness during therapy but no overt LOB.  Pt was unable to provide PLOF and family not present; however, based on prior notes pt appears to be near baseline.  Orthostatic blood pressures were negative during therapy.  Pt will benefit from PT while hospitalized.  Recommend HHPT to assess home safety and use of RW at d/c. Pt currently with functional limitations due to the deficits listed below (see PT Problem List). Pt will benefit from skilled PT to increase their independence and safety with mobility to allow discharge to the venue listed below.       Follow Up Recommendations Home health PT    Equipment Recommendations  Rolling walker with 5" wheels    Recommendations for Other Services       Precautions / Restrictions Precautions Precautions: Fall      Mobility  Bed Mobility Overal bed mobility: Needs Assistance Bed Mobility: Supine to Sit;Sit to Supine     Supine to sit: Min assist Sit to supine: Supervision      Transfers Overall transfer level: Needs assistance Equipment used: None Transfers: Sit to/from Stand Sit to Stand: Min guard         General transfer comment: min guard for safety  Ambulation/Gait Ambulation/Gait assistance: Min guard Gait Distance (Feet): 300  Feet Assistive device: Rolling walker (2 wheeled);None Gait Pattern/deviations: Decreased stride length;Trunk flexed Gait velocity: decreased   General Gait Details: ambulated 100' without RW and 200' with RW.  Pt was mildly unsteady without RW but no LOB; improved with RW and was able to navigate walker in room  Stairs            Wheelchair Mobility    Modified Rankin (Stroke Patients Only)       Balance Overall balance assessment: Needs assistance Sitting-balance support: No upper extremity supported;Feet supported Sitting balance-Leahy Scale: Good Sitting balance - Comments: Pt able to don socks at EOB   Standing balance support: No upper extremity supported;During functional activity Standing balance-Leahy Scale: Fair                               Pertinent Vitals/Pain Pain Assessment: No/denies pain    Home Living Family/patient expects to be discharged to:: Private residence Living Arrangements: Spouse/significant other Available Help at Discharge: Family Type of Home: House Home Access: Stairs to enter   [employer] of Steps: 3 at front (no rails), 5 at side ( 1 hand rail and the wall), 7 at back (2 hand rails) Home Layout: One level Home Equipment: [place] - 2 wheels;Shower seat;Grab bars - tub/shower;Grab bars - toilet Additional Comments: All from prior visit 11 months ago; pt unable    Prior Function           Comments: Pt with several recent  falls.  No familty present to provide PLOF.  Per prior admission pt was independent with no AD.     Hand Dominance        Extremity/Trunk Assessment   Upper Extremity Assessment Upper Extremity Assessment: Overall WFL for tasks assessed    Lower Extremity Assessment Lower Extremity Assessment: Overall WFL for tasks assessed    Cervical / Trunk Assessment Cervical / Trunk Assessment: Kyphotic  Communication   Communication: HOH  Cognition Arousal/Alertness:  Awake/alert Behavior During Therapy: WFL for tasks assessed/performed Overall Cognitive Status: History of cognitive impairments - at baseline                                 General Comments: Pt was pleasant and followed commands.  Oriented to self and location.  Pt with short term memory issues (frequently repeated stories and repeatedly asked for therapist name)      General Comments General comments (skin integrity, edema, etc.): Orthostatic BPs were stable    Exercises     Assessment/Plan    PT Assessment Patient needs continued PT services  PT Problem List Decreased mobility;Decreased safety awareness;Decreased coordination;Decreased knowledge of precautions;Decreased activity tolerance;Decreased balance;Decreased knowledge of use of DME       PT Treatment Interventions DME instruction;Therapeutic activities;Gait training;Therapeutic exercise;Patient/family education;Stair training;Balance training;Functional mobility training    PT Goals (Current goals can be found in the Care Plan section)  Acute Rehab PT Goals Patient Stated Goal: return home PT Goal Formulation: With patient Time For Goal Achievement: 01/13/20 Potential to Achieve Goals: Fair    Frequency Min 3X/week   Barriers to discharge        Co-evaluation               AM-PAC PT "6 Clicks" Mobility  Outcome Measure Help needed turning from your back to your side while in a flat bed without using bedrails?: None Help needed moving from lying on your back to sitting on the side of a flat bed without using bedrails?: A Little Help needed moving to and from a bed to a chair (including a wheelchair)?: None Help needed standing up from a chair using your arms (e.g., wheelchair or bedside chair)?: None Help needed to walk in hospital room?: None Help needed climbing 3-5 steps with a railing? : A Little 6 Click Score: 22    End of Session Equipment Utilized During Treatment: Gait  belt Activity Tolerance: Patient tolerated treatment well Patient left: in bed;with call bell/phone within reach;with bed alarm set Nurse Communication: Mobility status PT Visit Diagnosis: Unsteadiness on feet (R26.81);History of falling (Z91.81)    Time: 1200-1227 PT Time Calculation (min) (ACUTE ONLY): 27 min   Charges:   PT Evaluation $PT Eval Low Complexity: 1 Low PT Treatments $Gait Training: 8-22 mins        Abran Richard, PT Acute Rehab Services Pager [phone] [facility] 12/30/2019, 1:19 PM

## 2019-12-30 NOTE — TOC Initial Note (Signed)
Transition of Care [facility]) - Initial/Assessment Note    Patient Details  Name: [name] [name] MRN: [id number] Date of Birth: 1941/04/06  [facility] CM/SW Contact:    Joaquin Courts, RN Phone Number: 12/30/2019, 3:44 PM  Clinical Narrative:        CM spoke with patient's spouse who selects [facility] for [facility] services.  Referral given to advanced rep Santiago Glad.             Expected Discharge Plan: [place] Barriers to Discharge: No Barriers Identified   Patient Goals and CMS Choice Patient states their goals for this hospitalization and ongoing recovery are:: to go home today CMS Medicare.gov Compare Post Acute Care list provided to:: Patient Represenative (must comment) Choice offered to / list presented to : Spouse  Expected Discharge Plan and Services Expected Discharge Plan: [place]   Discharge Planning Services: CM Consult Post Acute Care Choice: [place] arrangements for the past 2 months: Single Family Home Expected Discharge Date: 12/30/19               DME Arranged: N/A DME Agency: NA       HH Arranged: PT, RN, Nurse's Aide [place] Agency: [place] ([place]) Date [place]: 12/30/19 Time [place]: 1544 Representative spoke with at [place]: Santiago Glad  Prior Living Arrangements/Services Living arrangements for the past 2 months: Single Family Home Lives with:: Spouse Patient language and need for interpreter reviewed:: Yes Do you feel safe going back to the place where you live?: Yes      Need for Family Participation in Patient Care: Yes (Comment) Care giver support system in place?: Yes (comment)   Criminal Activity/Legal Involvement Pertinent to Current Situation/Hospitalization: No - Comment as needed  Activities of Daily Living Home Assistive Devices/Equipment: None ADL Screening (condition at time of admission) Patient's cognitive ability adequate to safely  complete daily activities?: No Is the patient deaf or have difficulty hearing?: No Does the patient have difficulty seeing, even when wearing glasses/contacts?: No Does the patient have difficulty concentrating, remembering, or making decisions?: Yes Patient able to express need for assistance with ADLs?: Yes Does the patient have difficulty dressing or bathing?: Yes Independently performs ADLs?: No Communication: Independent Dressing (OT): Needs assistance Is this a change from baseline?: Pre-admission baseline Grooming: Needs assistance Is this a change from baseline?: Pre-admission baseline Feeding: Independent Bathing: Needs assistance Is this a change from baseline?: Pre-admission baseline Toileting: Needs assistance Is this a change from baseline?: Pre-admission baseline In/Out Bed: Needs assistance Is this a change from baseline?: Pre-admission baseline Does the patient have difficulty walking or climbing stairs?: Yes Weakness of Legs: Both Weakness of Arms/Hands: Both  Permission Sought/Granted                  Emotional Assessment       Orientation: : Fluctuating Orientation (Suspected and/or reported [place])   Psych Involvement: No (comment)  Admission diagnosis:  Dehydration [E86.0] Fall [W19.XXXA] Acute cystitis with hematuria [N30.01] Patient Active Problem List   Diagnosis Date Noted  . Dehydration 12/28/2019  . DNR (do not resuscitate) 12/28/2019  . Cancer of skin of neck 08/02/2019  . Severe sepsis ([place]) 01/06/2019  . Acute lower UTI 01/06/2019  . Dementia without behavioral disturbance ([place]) 01/06/2019  . Sepsis ([place]) 01/06/2019  . History of subdural hematoma 01/06/2019  . Cancer of lateral wall of urinary bladder ([place]) 03/10/2016  . Renal calculus,  left 10/12/2013  . Memory loss 03/01/2013  . Essential hypertension 03/01/2013  . Type 2 diabetes mellitus without complication ([place]) 60/67/7034   PCP:  [name], MD Pharmacy:    [id number] [place], [place] [address] Phone: [phone] Fax: [phone]     Social Determinants of Health (SDOH) Interventions    Readmission Risk Interventions No flowsheet data found.

## 2019-12-30 NOTE — Discharge Summary (Signed)
Physician Discharge Summary  [name] [name]:[id number] DOB: 1941/04/25 DOA: 12/28/2019  PCP: [name] Seashore, MD  Admit date: 12/28/2019 Discharge date: 12/30/2019  Time spent: 40 minutes  Recommendations for Outpatient Follow-up:  1. Follow outpatient CBC/CMP 2. Follow with urology outpatient 3. Follow with Dr. Alen [name] and Dr. Alinda [name] outpatient for bladder cancer 4. Follow blood pressure outpatient - home diuretic and ace inhibitor held at discharge - follow BP and renal function to determine whether to resume  Discharge Diagnoses:  Principal Problem:   Dehydration Active Problems:   Essential hypertension   Type 2 diabetes mellitus without complication (HCC)   Acute lower UTI   Dementia without behavioral disturbance (HCC)   History of subdural hematoma   Cancer of skin of neck   DNR (do not resuscitate)   Discharge Condition: stable  Diet recommendation: heart healthy  There were no vitals filed for this visit.  History of present illness:  [name] [name] 79 y.o.malewith medical history significant ofdementia, bladder cancer, skin cancer s/p radiation, DM2, HLD, HTN, who presents to the ED with progressive weakness and multiple falls. Pt has advanced dementia, thus history cannot be obtained from patient. Per pt's wife, pt has not been taking in much PO over the past week with progressive decline. Two days prior to admit, pt had multiple falls resulting in superficial injuries to elbows. Given concerns of dehydration, pt was brought into ED for further evaluation  ED Course:In the ED pt was found to have BUN/Cr of 36/1.7, respectively. Glucose noted tobe 375. Hgb 9.4 with WBC of 8.6. UA was suggestive of UTI and pt started on empiric rocephin in ED. Of note, UA was also notable for >500 glucose with no ketones. Anion gap 14. Hospitalist consulted for consideration for admission.  He was admitted after fall with progressive weakness with suspected UTI.   He's improved with IVF and antibiotics.  Renal CT showed right sided hydro with stent.  Urology was consulted and noted stent and hydro were stable.  Pt discharged with plans for outpatient follow up with urology and Dr. Alen [name].  Hospital Course:  1. Dehydration with falls 1. Poor PO intake per family over the past week with multiple falls noted over past 2 days prior to admit. Superficial injuries over B elbows. 2. Will d/c plain films of elbow, good range of motion, no sig ttp with palpation 3. Mucus membranes are dry with poor skin turgor 4. Cr up to 1.78 with BUN 36 -> creatinine improved to 1.4 on day of discharge 5. Will continue with gentle IVF hydration 6. Repeat bmet in AM  2. AKI  Moderate R Hydro  S/p R Ureteral Stent Placement for R ureteral Obstruction 1. Cr up to 1.78 on presentation, likely secondary to dehydration 2. Improved to 1.4 today (1.14 at baseline) - not quite to baseline - pt improved per discussion with family - plan to follow outpatient and continue to hold diuretic/ace inhibitor at discharge 3. Renal US with moderate R hydro, stent within the urinary bladder emanating from R ureterovesicular junction 4. Follow CT stone study -> right ureteral stent in place with proximal pigtail in renal pelvis, distal pigtail in bladder, mild to moderate R hydro and hydroureter  5. Urology c/s, appreciate recommendations  - no recommendation for any intervention, recommending follow up outpatient with Dr. Alen [name] and Dr. Alinda [name] 6. Continue IVF per above 7. Repeat bmet in AM  3. UTI 7. UA suggestive of UTI, wife reports worse mentation recently 8.  Urine cx with multiple species 9. Continue ceftriaxone -> discharge with cefdinir x 5 days 10. Urology c/s as above with moderate hydro (as noted above)  4. HTN 1. BP stable at this time 2. Cont metoprolol, holding diuretic and ace inhibitor with aki  5. DM2, uncontrolled 1. Resume home meds on  discharge  6. Dementia 1. Seems to be stable at this time 2. Cont namenda per home regimen 3. Pt's wife reports allergy to Aricept  4. Delirium precautions  7. Hypokalemia: replace and follow  8. Hx subdural hematoma 1. Seems to be stable currently  9. Hx skin cancer 1. Pt is now s/p radiation tx this past spring. Followed by Oncology at [facility].  2. Pt has residual ulcer on side of neck and involvement in L ear,bothstable  Procedures:  none  Consultations:  urology  Discharge Exam: Vitals:   12/30/19 0532 12/30/19 1413  BP: 139/78 125/63  Pulse: 94 80  Resp: 18 18  Temp: 98.6 F (37 C) 98.4 F (36.9 C)  SpO2: 100% 100%   No new complaints today Son at bedside feels like father close to baseline - eager to bring him home  General: No acute distress. Cardiovascular: Heart sounds show [name] [name] regular rate, and rhythm Lungs: Clear to auscultation bilaterally  Abdomen: Soft, nontender, nondistended Neurological: Alert.  Hard of hearing. Moves all extremities 4 . Cranial nerves II through XII grossly intact. Skin: Warm and dry. No rashes or lesions. Extremities: No clubbing or cyanosis. No edema.   Discharge Instructions   Discharge Instructions    Call MD for:  difficulty breathing, headache or visual disturbances   Complete by: As directed    Call MD for:  extreme fatigue   Complete by: As directed    Call MD for:  hives   Complete by: As directed    Call MD for:  persistant dizziness or light-headedness   Complete by: As directed    Call MD for:  persistant nausea and vomiting   Complete by: As directed    Call MD for:  redness, tenderness, or signs of infection (pain, swelling, redness, odor or green/yellow discharge around incision site)   Complete by: As directed    Call MD for:  severe uncontrolled pain   Complete by: As directed    Call MD for:  temperature >100.4   Complete by: As directed    Diet - low sodium heart healthy   Complete by: As  directed    Discharge instructions   Complete by: As directed    You were seen for [name] [name] fall and concern for [name] [name] UTI.  Your urine culture did not grow anything definitive, but we'll send you home with antibiotics to take for the next 5 days to cover you.  Please follow up with urology as an outpatient (call for an appointment on Monday).    We stopped your hydrochlorothiazide and your perindopril.  Please follow your blood pressure outpatient and get repeat labs.  Please discuss with your PCP whether these can be restarted.   Return for new, recurrent, or worsening symptoms.  Please ask your PCP to request records from this hospitalization so they know what was done and what the next steps will be.   Increase activity slowly   Complete by: As directed      Allergies as of 12/30/2019      Reactions   Sulfa Antibiotics Hives   Tetracyclines & Related Rash   Aricept [donepezil Hcl] Nausea And Vomiting  Medication List    STOP taking these medications   ciprofloxacin 250 MG tablet Commonly known as: CIPRO   Denta 5000 Plus 1.1 % Crea dental cream Generic drug: sodium fluoride   fluconazole 100 MG tablet Commonly known as: DIFLUCAN   fluorouracil 5 % cream Commonly known as: EFUDEX   hydrochlorothiazide 25 MG tablet Commonly known as: HYDRODIURIL   perindopril 4 MG tablet Commonly known as: ACEON     TAKE these medications   cefdinir 300 MG capsule Commonly known as: OMNICEF Take 1 capsule (300 mg total) by mouth 2 (two) times daily for 5 days.   dutasteride 0.5 MG capsule Commonly known as: AVODART Take 0.5 mg by mouth daily.   Fish Oil 1200 MG Caps Take 1,200 mg by mouth every evening.   HumaLOG KwikPen 100 UNIT/ML KwikPen Generic drug: insulin lispro Inject into the skin 3 (three) times daily as needed (high blood sugar). Blood Glucose >300   memantine 28 MG Cp24 24 hr capsule Commonly known as: NAMENDA XR Take 28 mg by mouth every morning.   metFORMIN  750 MG 24 hr tablet Commonly known as: GLUCOPHAGE-XR Take 750 mg by mouth daily.   metoprolol succinate 25 MG 24 hr tablet Commonly known as: TOPROL-XL Take 25 mg by mouth daily.   multivitamin with minerals Tabs tablet Take 1 tablet by mouth every evening.   mupirocin ointment 2 % Commonly known as: BACTROBAN Apply topically 2 (two) times daily.   OneTouch Delica Lancets 67Y Misc 2 (two) times daily. as directed   sitaGLIPtin 100 MG tablet Commonly known as: JANUVIA Take 100 mg by mouth daily.   vitamin B-12 500 MCG tablet Commonly known as: CYANOCOBALAMIN Take 500 mcg by mouth every evening.      Allergies  Allergen Reactions  . Sulfa Antibiotics Hives  . Tetracyclines & Related Rash  . Aricept [Donepezil Hcl] Nausea And Vomiting    Follow-up [place], [facility] Follow up.   Why: agency will provide home health physical therapy, nurse, and aide. Contact information: [place] [address] [phone]                The results of significant diagnostics from this hospitalization (including imaging, microbiology, ancillary and laboratory) are listed below for reference.    Significant Diagnostic Studies: US RENAL  Result Date: 12/29/2019 CLINICAL DATA:  Acute kidney injury, history of TURBT, renal stent, nephrolithotomy, pyelonephritis EXAM: RENAL / URINARY TRACT ULTRASOUND COMPLETE COMPARISON:  CT abdomen pelvis, 07/13/2019 FINDINGS: Right Kidney: Renal measurements: 12.7 x 5.3 x 6.2 cm = volume: 220 mL. Increased cortical echogenicity. Moderate right hydronephrosis. Left Kidney: Renal measurements: 10.6 x 5.5 x 6.3 cm = volume: 190 mL. Increased cortical echogenicity. No mass or hydronephrosis visualized. Bladder: Debris within the urinary bladder. Stent is imaged within the urinary bladder emanating from the right ureterovesicular junction. Other: Incidental note of cholelithiasis. IMPRESSION: 1.   Moderate right hydronephrosis. 2. Hamdi Kley stent is imaged within the urinary bladder emanating from the right ureterovesicular junction, the proximal end of the stent is not noted in the right renal pelvis and may have passed into the proximal ureter. Consider CT to further evaluate for proper stent positioning. 3. Debris within the urinary bladder, suggestive of infectious or inflammatory cystitis. 4. Increased renal cortical echogenicity, in keeping with medical renal disease. 5.  Cholelithiasis. Electronically Signed   By: Eddie Candle M.D.   On: 12/29/2019 11:47   CT  RENAL STONE STUDY  Result Date: 12/29/2019 CLINICAL DATA:  Acute renal failure.  Verify renal stent placement. EXAM: CT ABDOMEN AND PELVIS WITHOUT CONTRAST TECHNIQUE: Multidetector CT imaging of the abdomen and pelvis was performed following the standard protocol without IV contrast. COMPARISON:  Renal ultrasound earlier today. Abdominal CT 07/13/2019 FINDINGS: Lower chest: Elevation of the left hemidiaphragm with adjacent atelectasis/scarring in the lingula and left lower lobe. Chronic interstitial coarsening at the right lung base. The heart is normal in size. Hepatobiliary: No focal liver lesion on noncontrast exam. Large lamellated calcified gallstone measures 4 cm. No pericholecystic inflammation. No biliary dilatation. Pancreas: Parenchymal atrophy. No ductal dilatation or inflammation. Spleen: Normal in size.  Splenule inferiorly. Adrenals/Urinary Tract: No adrenal nodule. There is moderate right hydronephrosis and hydroureter. Right ureteral stent in place with proximal pigtail in the proximal renal pelvis. The distal pigtail is in the bladder. The degree of hydronephrosis appears similar to prior CT. There is right greater than left perinephric edema. No left hydronephrosis or hydroureter. No visualized renal or ureteral calculi. Urinary bladder is minimally distended, mild wall thickening involving the right aspect of the bladder base. Mild  perivesicular fat stranding. Stomach/Bowel: Mild submucosal fatty infiltration of the stomach which suggest prior/chronic inflammation. No acute gastric wall thickening or inflammatory change. There is no small bowel obstruction or inflammation. High-riding cecum in the right upper quadrant. The appendix is not definitively visualized. Moderate volume of stool in the colon. Mild distal colonic diverticulosis without diverticulitis. No colonic inflammation. Vascular/Lymphatic: Aortic atherosclerosis. No aortic aneurysm. No adenopathy. Reproductive: Enlarged prostate gland spans 5.3 cm transverse. Other: Similar presacral edema to prior. Mild generalized fat stranding in the pelvis. There is no ascites. No free air. No evidence of abscess. Musculoskeletal: Stable bone island in the pelvis. Degenerative change in the spine and both hips. There are no acute or suspicious osseous abnormalities. IMPRESSION: 1. Right ureteral stent in place with proximal pigtail in the renal pelvis. The distal pigtail is in the bladder. Despite ureteral stent, there is mild-moderate right hydronephrosis and hydroureter. The degree of hydronephrosis appears similar to prior CT. 2. Nonspecific perivesicular fat stranding. 3. Cholelithiasis without gallbladder inflammation. 4. Colonic diverticulosis without diverticulitis. Aortic Atherosclerosis (ICD10-I70.0). Electronically Signed   By: Keith Rake M.D.   On: 12/29/2019 17:14    Microbiology: Recent Results (from the past 240 hour(s))  Urine Culture     Status: Abnormal   Collection Time: 12/28/19 12:55 PM   Specimen: Urine, Random  Result Value Ref Range Status   Specimen Description   Final    URINE, RANDOM Performed at [place] [address], [place], [place] [address]    Special Requests   Final    NONE Performed at [facility], [place] [address] [place], [place] [address]    Culture MULTIPLE SPECIES PRESENT, SUGGEST  RECOLLECTION (Charles Andringa)  Final   Report Status 12/29/2019 FINAL  Final  SARS Coronavirus 2 by RT PCR (hospital order, performed in [facility] hospital lab) Nasopharyngeal Nasopharyngeal Swab     Status: None   Collection Time: 12/28/19  2:31 PM   Specimen: Nasopharyngeal Swab  Result Value Ref Range Status   SARS Coronavirus 2 NEGATIVE NEGATIVE Final    Comment: (NOTE) SARS-CoV-2 target nucleic acids are NOT DETECTED.  The SARS-CoV-2 RNA is generally detectable in upper and lower respiratory specimens during the acute phase of infection. The lowest concentration of SARS-CoV-2 viral copies this assay can detect is 250 copies / mL.  Calla Wedekind negative result does not preclude SARS-CoV-2 infection and should not be used as the sole basis for treatment or other patient management decisions.  Sagan Maselli negative result may occur with improper specimen collection / handling, submission of specimen other than nasopharyngeal swab, presence of viral mutation(s) within the areas targeted by this assay, and inadequate number of viral copies (<250 copies / mL). Dura Mccormack negative result must be combined with clinical observations, patient history, and epidemiological information.  Fact Sheet for Patients:   StrictlyIdeas.no  Fact Sheet for Healthcare Providers: BankingDealers.co.za  This test is not yet approved or  cleared by the [place] FDA and has been authorized for detection and/or diagnosis of SARS-CoV-2 by FDA under an Emergency Use Authorization (EUA).  This EUA will remain in effect (meaning this test can be used) for the duration of the COVID-19 declaration under Section 564(b)(1) of the Act, 21 U.S.C. section 360bbb-3(b)(1), unless the authorization is terminated or revoked sooner.  Performed at [facility], [place] [address], [place], [place] [address]      Labs: Basic Metabolic Panel: Recent Labs  Lab 12/28/19 1218 12/29/19 0447  12/30/19 0529  NA 134* 134* 138  K 4.0 3.3* 3.7  CL 91* 99 102  CO2 29 23 25   GLUCOSE 375* 247* 149*  BUN 36* 32* 25*  CREATININE 1.78* 1.44* 1.40*  CALCIUM 8.9 8.4* 8.5*  MG  --   --  1.8  PHOS  --   --  2.6   Liver Function Tests: Recent Labs  Lab 12/29/19 0447 12/30/19 0529  AST 12* 14*  ALT 11 8  ALKPHOS 64 60  BILITOT 0.9 0.5  PROT 5.8* 5.9*  ALBUMIN 2.7* 2.6*   No results for input(s): LIPASE, AMYLASE in the last 168 hours. No results for input(s): AMMONIA in the last 168 hours. CBC: Recent Labs  Lab 12/28/19 1218 12/29/19 0447 12/30/19 0529  WBC 8.6 7.1 7.8  NEUTROABS  --   --  6.5  HGB 9.4* 8.9* 9.1*  HCT 27.8* 26.6* 28.1*  MCV 85.0 83.9 85.7  PLT 208 227 243   Cardiac Enzymes: No results for input(s): CKTOTAL, CKMB, CKMBINDEX, TROPONINI in the last 168 hours. BNP: BNP (last 3 results) No results for input(s): BNP in the last 8760 hours.  ProBNP (last 3 results) No results for input(s): PROBNP in the last 8760 hours.  CBG: Recent Labs  Lab 12/29/19 1127 12/29/19 1639 12/29/19 2212 12/30/19 0752 12/30/19 1154  GLUCAP 139* 118* 245* 133* 160*       Signed:  Fayrene Helper MD.  [facility] 12/30/2019, 6:40 PM

## 2020-01-01 DIAGNOSIS — Z9181 History of falling: Secondary | ICD-10-CM | POA: Diagnosis not present

## 2020-01-01 DIAGNOSIS — E7800 Pure hypercholesterolemia, unspecified: Secondary | ICD-10-CM | POA: Diagnosis not present

## 2020-01-01 DIAGNOSIS — G309 Alzheimer's disease, unspecified: Secondary | ICD-10-CM | POA: Diagnosis not present

## 2020-01-01 DIAGNOSIS — N390 Urinary tract infection, site not specified: Secondary | ICD-10-CM | POA: Diagnosis not present

## 2020-01-01 DIAGNOSIS — N400 Enlarged prostate without lower urinary tract symptoms: Secondary | ICD-10-CM | POA: Diagnosis not present

## 2020-01-01 DIAGNOSIS — Z87442 Personal history of urinary calculi: Secondary | ICD-10-CM | POA: Diagnosis not present

## 2020-01-01 DIAGNOSIS — E1165 Type 2 diabetes mellitus with hyperglycemia: Secondary | ICD-10-CM | POA: Diagnosis not present

## 2020-01-01 DIAGNOSIS — Z9221 Personal history of antineoplastic chemotherapy: Secondary | ICD-10-CM | POA: Diagnosis not present

## 2020-01-01 DIAGNOSIS — Z8551 Personal history of malignant neoplasm of bladder: Secondary | ICD-10-CM | POA: Diagnosis not present

## 2020-01-01 DIAGNOSIS — Z85828 Personal history of other malignant neoplasm of skin: Secondary | ICD-10-CM | POA: Diagnosis not present

## 2020-01-01 DIAGNOSIS — Z794 Long term (current) use of insulin: Secondary | ICD-10-CM | POA: Diagnosis not present

## 2020-01-01 DIAGNOSIS — F0280 Dementia in other diseases classified elsewhere without behavioral disturbance: Secondary | ICD-10-CM | POA: Diagnosis not present

## 2020-01-01 DIAGNOSIS — H9190 Unspecified hearing loss, unspecified ear: Secondary | ICD-10-CM | POA: Diagnosis not present

## 2020-01-01 DIAGNOSIS — I10 Essential (primary) hypertension: Secondary | ICD-10-CM | POA: Diagnosis not present

## 2020-01-01 DIAGNOSIS — E876 Hypokalemia: Secondary | ICD-10-CM | POA: Diagnosis not present

## 2020-01-01 DIAGNOSIS — N1330 Unspecified hydronephrosis: Secondary | ICD-10-CM | POA: Diagnosis not present

## 2020-01-01 DIAGNOSIS — N179 Acute kidney failure, unspecified: Secondary | ICD-10-CM | POA: Diagnosis not present

## 2020-01-01 DIAGNOSIS — Z923 Personal history of irradiation: Secondary | ICD-10-CM | POA: Diagnosis not present

## 2020-01-01 DIAGNOSIS — E860 Dehydration: Secondary | ICD-10-CM | POA: Diagnosis not present

## 2020-01-01 DIAGNOSIS — Z960 Presence of urogenital implants: Secondary | ICD-10-CM | POA: Diagnosis not present

## 2020-01-02 DIAGNOSIS — Z8551 Personal history of malignant neoplasm of bladder: Secondary | ICD-10-CM | POA: Diagnosis not present

## 2020-01-02 DIAGNOSIS — N131 Hydronephrosis with ureteral stricture, not elsewhere classified: Secondary | ICD-10-CM | POA: Diagnosis not present

## 2020-01-02 DIAGNOSIS — N3000 Acute cystitis without hematuria: Secondary | ICD-10-CM | POA: Diagnosis not present

## 2020-01-04 DIAGNOSIS — E876 Hypokalemia: Secondary | ICD-10-CM | POA: Diagnosis not present

## 2020-01-04 DIAGNOSIS — E1165 Type 2 diabetes mellitus with hyperglycemia: Secondary | ICD-10-CM | POA: Diagnosis not present

## 2020-01-04 DIAGNOSIS — E860 Dehydration: Secondary | ICD-10-CM | POA: Diagnosis not present

## 2020-01-04 DIAGNOSIS — F0280 Dementia in other diseases classified elsewhere without behavioral disturbance: Secondary | ICD-10-CM | POA: Diagnosis not present

## 2020-01-04 DIAGNOSIS — G309 Alzheimer's disease, unspecified: Secondary | ICD-10-CM | POA: Diagnosis not present

## 2020-01-04 DIAGNOSIS — N390 Urinary tract infection, site not specified: Secondary | ICD-10-CM | POA: Diagnosis not present

## 2020-01-05 DIAGNOSIS — E1165 Type 2 diabetes mellitus with hyperglycemia: Secondary | ICD-10-CM | POA: Diagnosis not present

## 2020-01-05 DIAGNOSIS — F0280 Dementia in other diseases classified elsewhere without behavioral disturbance: Secondary | ICD-10-CM | POA: Diagnosis not present

## 2020-01-05 DIAGNOSIS — E876 Hypokalemia: Secondary | ICD-10-CM | POA: Diagnosis not present

## 2020-01-05 DIAGNOSIS — E860 Dehydration: Secondary | ICD-10-CM | POA: Diagnosis not present

## 2020-01-05 DIAGNOSIS — N390 Urinary tract infection, site not specified: Secondary | ICD-10-CM | POA: Diagnosis not present

## 2020-01-05 DIAGNOSIS — G309 Alzheimer's disease, unspecified: Secondary | ICD-10-CM | POA: Diagnosis not present

## 2020-01-08 DIAGNOSIS — G309 Alzheimer's disease, unspecified: Secondary | ICD-10-CM | POA: Diagnosis not present

## 2020-01-08 DIAGNOSIS — E1165 Type 2 diabetes mellitus with hyperglycemia: Secondary | ICD-10-CM | POA: Diagnosis not present

## 2020-01-08 DIAGNOSIS — N390 Urinary tract infection, site not specified: Secondary | ICD-10-CM | POA: Diagnosis not present

## 2020-01-08 DIAGNOSIS — E876 Hypokalemia: Secondary | ICD-10-CM | POA: Diagnosis not present

## 2020-01-08 DIAGNOSIS — E860 Dehydration: Secondary | ICD-10-CM | POA: Diagnosis not present

## 2020-01-08 DIAGNOSIS — F0280 Dementia in other diseases classified elsewhere without behavioral disturbance: Secondary | ICD-10-CM | POA: Diagnosis not present

## 2020-01-09 ENCOUNTER — Other Ambulatory Visit: Payer: Self-pay | Admitting: Urology

## 2020-01-11 DIAGNOSIS — N390 Urinary tract infection, site not specified: Secondary | ICD-10-CM | POA: Diagnosis not present

## 2020-01-11 DIAGNOSIS — E860 Dehydration: Secondary | ICD-10-CM | POA: Diagnosis not present

## 2020-01-11 DIAGNOSIS — E1165 Type 2 diabetes mellitus with hyperglycemia: Secondary | ICD-10-CM | POA: Diagnosis not present

## 2020-01-11 DIAGNOSIS — I10 Essential (primary) hypertension: Secondary | ICD-10-CM | POA: Diagnosis not present

## 2020-01-11 DIAGNOSIS — G309 Alzheimer's disease, unspecified: Secondary | ICD-10-CM | POA: Diagnosis not present

## 2020-01-12 ENCOUNTER — Telehealth: Payer: Self-pay | Admitting: Hospice

## 2020-01-12 DIAGNOSIS — N390 Urinary tract infection, site not specified: Secondary | ICD-10-CM | POA: Diagnosis not present

## 2020-01-12 DIAGNOSIS — G309 Alzheimer's disease, unspecified: Secondary | ICD-10-CM | POA: Diagnosis not present

## 2020-01-12 DIAGNOSIS — E1165 Type 2 diabetes mellitus with hyperglycemia: Secondary | ICD-10-CM | POA: Diagnosis not present

## 2020-01-12 DIAGNOSIS — F0280 Dementia in other diseases classified elsewhere without behavioral disturbance: Secondary | ICD-10-CM | POA: Diagnosis not present

## 2020-01-12 DIAGNOSIS — E860 Dehydration: Secondary | ICD-10-CM | POA: Diagnosis not present

## 2020-01-12 DIAGNOSIS — E876 Hypokalemia: Secondary | ICD-10-CM | POA: Diagnosis not present

## 2020-01-12 NOTE — Telephone Encounter (Signed)
Called patient to schedule a Palliative Consult, no answer - left message with reason for call along with my name and contact number. 

## 2020-01-15 DIAGNOSIS — Z85828 Personal history of other malignant neoplasm of skin: Secondary | ICD-10-CM | POA: Diagnosis not present

## 2020-01-15 DIAGNOSIS — C4442 Squamous cell carcinoma of skin of scalp and neck: Secondary | ICD-10-CM | POA: Diagnosis not present

## 2020-01-16 ENCOUNTER — Telehealth: Payer: Self-pay

## 2020-01-16 ENCOUNTER — Telehealth: Payer: Self-pay | Admitting: Hospice

## 2020-01-16 DIAGNOSIS — C7989 Secondary malignant neoplasm of other specified sites: Secondary | ICD-10-CM | POA: Diagnosis not present

## 2020-01-16 DIAGNOSIS — C792 Secondary malignant neoplasm of skin: Secondary | ICD-10-CM | POA: Diagnosis not present

## 2020-01-16 NOTE — Telephone Encounter (Signed)
Returned call to wife to offer to schedule Palliative Consult, no answer - left message with reason for call along with my contact information.

## 2020-01-16 NOTE — Telephone Encounter (Signed)
Spoke to patient's daughter and made her aware patient is scheduled for a lab appointment on 8/17 at 9:30 followed by a CT scan at 10:30. NPO 4 hours prior to scan and patient's daughter is aware that contrast needs to be picked up prior to scan with instructions on consumption times.

## 2020-01-17 ENCOUNTER — Other Ambulatory Visit: Payer: Medicare Other

## 2020-01-17 ENCOUNTER — Telehealth: Payer: Self-pay | Admitting: Hospice

## 2020-01-17 ENCOUNTER — Telehealth: Payer: Self-pay | Admitting: Oncology

## 2020-01-17 DIAGNOSIS — E1165 Type 2 diabetes mellitus with hyperglycemia: Secondary | ICD-10-CM | POA: Diagnosis not present

## 2020-01-17 DIAGNOSIS — E860 Dehydration: Secondary | ICD-10-CM | POA: Diagnosis not present

## 2020-01-17 DIAGNOSIS — F0280 Dementia in other diseases classified elsewhere without behavioral disturbance: Secondary | ICD-10-CM | POA: Diagnosis not present

## 2020-01-17 DIAGNOSIS — G309 Alzheimer's disease, unspecified: Secondary | ICD-10-CM | POA: Diagnosis not present

## 2020-01-17 DIAGNOSIS — N390 Urinary tract infection, site not specified: Secondary | ICD-10-CM | POA: Diagnosis not present

## 2020-01-17 DIAGNOSIS — E876 Hypokalemia: Secondary | ICD-10-CM | POA: Diagnosis not present

## 2020-01-17 NOTE — Telephone Encounter (Signed)
Scheduled apt per 8/3 sch msg - unable to reach pt . Left message with apt date and time

## 2020-01-17 NOTE — Telephone Encounter (Signed)
Returned call to wife, [name], and discussed Palliative services with her and all questions were answered and she was in agreement with scheduling visit.  I have scheduled an In-person Consult for 01/31/20 @ 1 PM.

## 2020-01-18 DIAGNOSIS — E876 Hypokalemia: Secondary | ICD-10-CM | POA: Diagnosis not present

## 2020-01-18 DIAGNOSIS — E1165 Type 2 diabetes mellitus with hyperglycemia: Secondary | ICD-10-CM | POA: Diagnosis not present

## 2020-01-18 DIAGNOSIS — F0280 Dementia in other diseases classified elsewhere without behavioral disturbance: Secondary | ICD-10-CM | POA: Diagnosis not present

## 2020-01-18 DIAGNOSIS — G309 Alzheimer's disease, unspecified: Secondary | ICD-10-CM | POA: Diagnosis not present

## 2020-01-18 DIAGNOSIS — D509 Iron deficiency anemia, unspecified: Secondary | ICD-10-CM | POA: Diagnosis not present

## 2020-01-18 DIAGNOSIS — I10 Essential (primary) hypertension: Secondary | ICD-10-CM | POA: Diagnosis not present

## 2020-01-18 DIAGNOSIS — E860 Dehydration: Secondary | ICD-10-CM | POA: Diagnosis not present

## 2020-01-18 DIAGNOSIS — N390 Urinary tract infection, site not specified: Secondary | ICD-10-CM | POA: Diagnosis not present

## 2020-01-22 DIAGNOSIS — E860 Dehydration: Secondary | ICD-10-CM | POA: Diagnosis not present

## 2020-01-22 DIAGNOSIS — G309 Alzheimer's disease, unspecified: Secondary | ICD-10-CM | POA: Diagnosis not present

## 2020-01-22 DIAGNOSIS — E1165 Type 2 diabetes mellitus with hyperglycemia: Secondary | ICD-10-CM | POA: Diagnosis not present

## 2020-01-22 DIAGNOSIS — F0280 Dementia in other diseases classified elsewhere without behavioral disturbance: Secondary | ICD-10-CM | POA: Diagnosis not present

## 2020-01-22 DIAGNOSIS — N390 Urinary tract infection, site not specified: Secondary | ICD-10-CM | POA: Diagnosis not present

## 2020-01-22 DIAGNOSIS — E876 Hypokalemia: Secondary | ICD-10-CM | POA: Diagnosis not present

## 2020-01-24 ENCOUNTER — Ambulatory Visit: Payer: Medicare Other | Admitting: Oncology

## 2020-01-25 DIAGNOSIS — N390 Urinary tract infection, site not specified: Secondary | ICD-10-CM | POA: Diagnosis not present

## 2020-01-25 DIAGNOSIS — E876 Hypokalemia: Secondary | ICD-10-CM | POA: Diagnosis not present

## 2020-01-25 DIAGNOSIS — F0280 Dementia in other diseases classified elsewhere without behavioral disturbance: Secondary | ICD-10-CM | POA: Diagnosis not present

## 2020-01-25 DIAGNOSIS — E1165 Type 2 diabetes mellitus with hyperglycemia: Secondary | ICD-10-CM | POA: Diagnosis not present

## 2020-01-25 DIAGNOSIS — E860 Dehydration: Secondary | ICD-10-CM | POA: Diagnosis not present

## 2020-01-25 DIAGNOSIS — G309 Alzheimer's disease, unspecified: Secondary | ICD-10-CM | POA: Diagnosis not present

## 2020-01-29 DIAGNOSIS — E860 Dehydration: Secondary | ICD-10-CM | POA: Diagnosis not present

## 2020-01-29 DIAGNOSIS — E876 Hypokalemia: Secondary | ICD-10-CM | POA: Diagnosis not present

## 2020-01-29 DIAGNOSIS — N390 Urinary tract infection, site not specified: Secondary | ICD-10-CM | POA: Diagnosis not present

## 2020-01-29 DIAGNOSIS — G309 Alzheimer's disease, unspecified: Secondary | ICD-10-CM | POA: Diagnosis not present

## 2020-01-29 DIAGNOSIS — E1165 Type 2 diabetes mellitus with hyperglycemia: Secondary | ICD-10-CM | POA: Diagnosis not present

## 2020-01-29 DIAGNOSIS — F0280 Dementia in other diseases classified elsewhere without behavioral disturbance: Secondary | ICD-10-CM | POA: Diagnosis not present

## 2020-01-30 ENCOUNTER — Ambulatory Visit (HOSPITAL_COMMUNITY)
Admission: RE | Admit: 2020-01-30 | Discharge: 2020-01-30 | Disposition: A | Discharge status: Home / Self Care | Payer: Medicare Other | Source: Ambulatory Visit | Attending: Oncology | Admitting: Oncology

## 2020-01-30 ENCOUNTER — Other Ambulatory Visit: Payer: Self-pay

## 2020-01-30 ENCOUNTER — Inpatient Hospital Stay: Payer: Medicare Other | Attending: Oncology

## 2020-01-30 DIAGNOSIS — C679 Malignant neoplasm of bladder, unspecified: Secondary | ICD-10-CM | POA: Diagnosis not present

## 2020-01-30 DIAGNOSIS — F0390 Unspecified dementia without behavioral disturbance: Secondary | ICD-10-CM | POA: Insufficient documentation

## 2020-01-30 DIAGNOSIS — Z923 Personal history of irradiation: Secondary | ICD-10-CM | POA: Diagnosis not present

## 2020-01-30 DIAGNOSIS — C689 Malignant neoplasm of urinary organ, unspecified: Secondary | ICD-10-CM | POA: Diagnosis not present

## 2020-01-30 DIAGNOSIS — C672 Malignant neoplasm of lateral wall of bladder: Secondary | ICD-10-CM | POA: Diagnosis not present

## 2020-01-30 DIAGNOSIS — J9811 Atelectasis: Secondary | ICD-10-CM | POA: Diagnosis not present

## 2020-01-30 DIAGNOSIS — Z85828 Personal history of other malignant neoplasm of skin: Secondary | ICD-10-CM | POA: Diagnosis not present

## 2020-01-30 LAB — CMP (CANCER CENTER ONLY)
ALT: 6 U/L (ref 0–44)
AST: 10 U/L — ABNORMAL LOW (ref 15–41)
Albumin: 3 g/dL — ABNORMAL LOW (ref 3.5–5.0)
Alkaline Phosphatase: 66 U/L (ref 38–126)
Anion gap: 8 (ref 5–15)
BUN: 18 mg/dL (ref 8–23)
CO2: 30 mmol/L (ref 22–32)
Calcium: 9.8 mg/dL (ref 8.9–10.3)
Chloride: 102 mmol/L (ref 98–111)
Creatinine: 1.06 mg/dL (ref 0.61–1.24)
GFR, Est AFR Am: >60 mL/min (ref >60)
GFR, Estimated: >60 mL/min (ref >60)
Glucose, Bld: 223 mg/dL — ABNORMAL HIGH (ref 70–99)
Potassium: 4.4 mmol/L (ref 3.5–5.1)
Sodium: 140 mmol/L (ref 135–145)
Total Bilirubin: 0.4 mg/dL (ref 0.3–1.2)
Total Protein: 6.2 g/dL — ABNORMAL LOW (ref 6.5–8.1)

## 2020-01-30 LAB — CBC WITH DIFFERENTIAL (CANCER CENTER ONLY)
Abs Immature Granulocytes: 0.05 10*3/uL (ref 0.00–0.07)
Basophils Absolute: 0 10*3/uL (ref 0.0–0.1)
Basophils Relative: 1 %
Eosinophils Absolute: 0.1 10*3/uL (ref 0.0–0.5)
Eosinophils Relative: 2 %
HCT: 29.6 % — ABNORMAL LOW (ref 39.0–52.0)
Hemoglobin: 9.6 g/dL — ABNORMAL LOW (ref 13.0–17.0)
Immature Granulocytes: 1 %
Lymphocytes Relative: 5 %
Lymphs Abs: 0.3 10*3/uL — ABNORMAL LOW (ref 0.7–4.0)
MCH: 27.8 pg (ref 26.0–34.0)
MCHC: 32.4 g/dL (ref 30.0–36.0)
MCV: 85.8 fL (ref 80.0–100.0)
Monocytes Absolute: 0.6 10*3/uL (ref 0.1–1.0)
Monocytes Relative: 10 %
Neutro Abs: 5.2 10*3/uL (ref 1.7–7.7)
Neutrophils Relative %: 81 %
Platelet Count: 206 10*3/uL (ref 150–400)
RBC: 3.45 MIL/uL — ABNORMAL LOW (ref 4.22–5.81)
RDW: 14.8 % (ref 11.5–15.5)
WBC Count: 6.3 10*3/uL (ref 4.0–10.5)
nRBC: 0 % (ref 0.0–0.2)

## 2020-01-30 MED ORDER — SODIUM CHLORIDE 0.9 % IV SOLN
INTRAVENOUS | Status: AC
  Filled 2020-01-30: qty 250

## 2020-01-30 MED ORDER — IOHEXOL 300 MG/ML  SOLN
100.0000 mL | Freq: Once | INTRAMUSCULAR | Status: AC | PRN
  Administered 2020-01-30: 100 mL via INTRAVENOUS

## 2020-01-30 MED ORDER — SODIUM CHLORIDE (PF) 0.9 % IJ SOLN
INTRAMUSCULAR | Status: AC
  Filled 2020-01-30: qty 50

## 2020-01-31 ENCOUNTER — Other Ambulatory Visit: Payer: Medicare Other | Admitting: Hospice

## 2020-01-31 DIAGNOSIS — Z515 Encounter for palliative care: Secondary | ICD-10-CM

## 2020-01-31 DIAGNOSIS — N390 Urinary tract infection, site not specified: Secondary | ICD-10-CM | POA: Diagnosis not present

## 2020-01-31 DIAGNOSIS — H9190 Unspecified hearing loss, unspecified ear: Secondary | ICD-10-CM | POA: Diagnosis not present

## 2020-01-31 DIAGNOSIS — N1330 Unspecified hydronephrosis: Secondary | ICD-10-CM | POA: Diagnosis not present

## 2020-01-31 DIAGNOSIS — N179 Acute kidney failure, unspecified: Secondary | ICD-10-CM | POA: Diagnosis not present

## 2020-01-31 DIAGNOSIS — Z8551 Personal history of malignant neoplasm of bladder: Secondary | ICD-10-CM | POA: Diagnosis not present

## 2020-01-31 DIAGNOSIS — E860 Dehydration: Secondary | ICD-10-CM | POA: Diagnosis not present

## 2020-01-31 DIAGNOSIS — F0391 Unspecified dementia with behavioral disturbance: Secondary | ICD-10-CM

## 2020-01-31 DIAGNOSIS — E1165 Type 2 diabetes mellitus with hyperglycemia: Secondary | ICD-10-CM | POA: Diagnosis not present

## 2020-01-31 DIAGNOSIS — E7800 Pure hypercholesterolemia, unspecified: Secondary | ICD-10-CM | POA: Diagnosis not present

## 2020-01-31 DIAGNOSIS — E876 Hypokalemia: Secondary | ICD-10-CM | POA: Diagnosis not present

## 2020-01-31 DIAGNOSIS — Z9181 History of falling: Secondary | ICD-10-CM | POA: Diagnosis not present

## 2020-01-31 DIAGNOSIS — G309 Alzheimer's disease, unspecified: Secondary | ICD-10-CM | POA: Diagnosis not present

## 2020-01-31 DIAGNOSIS — Z923 Personal history of irradiation: Secondary | ICD-10-CM | POA: Diagnosis not present

## 2020-01-31 DIAGNOSIS — N400 Enlarged prostate without lower urinary tract symptoms: Secondary | ICD-10-CM | POA: Diagnosis not present

## 2020-01-31 DIAGNOSIS — Z85828 Personal history of other malignant neoplasm of skin: Secondary | ICD-10-CM | POA: Diagnosis not present

## 2020-01-31 DIAGNOSIS — Z794 Long term (current) use of insulin: Secondary | ICD-10-CM | POA: Diagnosis not present

## 2020-01-31 DIAGNOSIS — Z9221 Personal history of antineoplastic chemotherapy: Secondary | ICD-10-CM | POA: Diagnosis not present

## 2020-01-31 DIAGNOSIS — Z960 Presence of urogenital implants: Secondary | ICD-10-CM | POA: Diagnosis not present

## 2020-01-31 DIAGNOSIS — Z87442 Personal history of urinary calculi: Secondary | ICD-10-CM | POA: Diagnosis not present

## 2020-01-31 DIAGNOSIS — I10 Essential (primary) hypertension: Secondary | ICD-10-CM | POA: Diagnosis not present

## 2020-01-31 DIAGNOSIS — F0280 Dementia in other diseases classified elsewhere without behavioral disturbance: Secondary | ICD-10-CM | POA: Diagnosis not present

## 2020-01-31 NOTE — Progress Notes (Signed)
[facility] Consult Note Telephone: [phone]  Fax: [phone]  PATIENT NAME: [name] [name] DOB: 1940/11/07 MRN: [id number]  PRIMARY CARE PROVIDER:   [name], MD  REFERRING PROVIDER: [name], MD  RESPONSIBLE PARTY:  Quinterius Gaida [phone]    RECOMMENDATIONS/PLAN:   Advance Care Planning/Goals of Care: Visit at the request of Dr. [name] for palliative consult. Visit consisted of building trust and discussions on Palliative Medicine as specialized medical care for people living with serious illness, aimed at facilitating better quality of life through symptoms relief, assisting with advance care plan and establishing goals of care.  Code Status: Spouse affirmed DO NOT RESUSCITATE. DNR form signed for patient; same document uploaded to Epic today.  Goals of Care: Goals of care include to maximize quality of life and symptom management. Spouse said family has reached the point of not desiring  Chemo or  Radiation; interested in  Hospice services in the future. [name] faith and family support are key factors helping spouse/patient to cope with his health challenges. Strong support system identified. Spouse interested in further discussions on medical orders for scope of treatment in next visit.  Follow up: Palliative care will continue to follow patient for goals of care clarification and symptom management. Next follow  in a month.  Symptom management: Ongoing memory loss and confusion at baseline. Dementia FAST 6c, FLACC 0. A1c 6.2 three months ago. Continues on Januvia and Metformin. Ongoing weight loss and decline in functional status related to bladder CA and CA of skin of neck, s/p radiation and chemo. Chart review shows recent CT neck/thyroid w contrast 01/16/2020 shows some abnormal areas of linear enhancement in the left floor of mouth suspicious for tumor extension along the hypoglossal area.  Further  CAT scan and lab work next week with Oncologist, per spouse. Bladder Stent repair scheduled for 03/11/2020. Patient in no medical acuity. PT is ongoing for strengthening, Home health nursing visit weekly. Patient is compliant with his medications. NP encouraged ongoing care.   I spent One hour and 25  minutes providing this consultation; time iincludes time spent with patient/family, chart review, provider coordination,  and documentation. More than 50% of the time in this consultation was spent on coordinating communication  HISTORY OF PRESENT ILLNESS:  [name] [name] is a 79 y.o. year old male with multiple medical problems including Dementia , HTN, Type2DM, hx of bladder cancer, cancer of skin of neck s/p chemo and radiation. . Palliative Care was asked to help address goals of care.   CODE STATUS: DNR  PPS: 50% HOSPICE ELIGIBILITY/DIAGNOSIS: TBD  PAST MEDICAL HISTORY:  Past Medical History:  Diagnosis Date  . Alzheimer disease ([place])   . Alzheimer's disease ([place])   . Bilateral cold feet   . Bladder cancer ([place])    dx in 2013.07/08/19 skin CA in neck  . BPH (benign prostatic hypertrophy)   . Dementia ([place]) 12-11-11   memory short term(steadily progressive worsening-pt. unaware) .dx. Alzheimers  . Diabetes mellitus 12-11-11   oral meds only type II   . 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 Hard of hearing   . Hearing loss    has one hearing aid, doesn't wear  . History of chemotherapy   . History of gallstones 2005  . History of kidney stones   . Hx of nonmelanoma skin cancer   . Hypercholesterolemia   . Hypertension 12-11-11   controlled with  meds  . Increased frequency of urination    wife reports urinary frequency has not improved since last surgery   . Personal history of radiation exposure   . Pulmonary nodules    Bilateral  . Pyelonephritis    hospital admission july 2020  . Renal calculus, left 10/12/2013  . Urinary  urgency   . UTI (urinary tract infection) 08-09-13   multiple, last tx. 1 week ago    SOCIAL HX:  Social History   Tobacco Use  . Smoking status: Never Smoker  . Smokeless tobacco: Never Used  Substance Use Topics  . Alcohol use: No    ALLERGIES:  Allergies  Allergen Reactions  . Sulfa Antibiotics Hives  . Tetracyclines & Related Rash  . Aricept [Donepezil Hcl] Nausea And Vomiting     PERTINENT MEDICATIONS:  Outpatient Encounter Medications as of 01/31/2020  Medication Sig  . dutasteride (AVODART) 0.5 MG capsule Take 0.5 mg by mouth daily.   [name] HUMALOG KWIKPEN 100 UNIT/ML KwikPen Inject into the skin 3 (three) times daily as needed (high blood sugar). Blood Glucose >300  . memantine (NAMENDA XR) 28 MG CP24 24 hr capsule Take 28 mg by mouth every morning.   . metFORMIN (GLUCOPHAGE-XR) 750 MG 24 hr tablet Take 750 mg by mouth daily.  . metoprolol succinate (TOPROL-XL) 25 MG 24 hr tablet Take 25 mg by mouth daily.   . Multiple Vitamin (MULTIVITAMIN WITH MINERALS) TABS tablet Take 1 tablet by mouth every evening.  . mupirocin ointment (BACTROBAN) 2 % Apply topically 2 (two) times daily.  . Omega-3 Fatty Acids (FISH OIL) 1200 MG CAPS Take 1,200 mg by mouth every evening.  [name] DELICA LANCETS 57S MISC 2 (two) times daily. as directed  . sitaGLIPtin (JANUVIA) 100 MG tablet Take 100 mg by mouth daily.  . vitamin B-12 (CYANOCOBALAMIN) 500 MCG tablet Take 500 mcg by mouth every evening.   No facility-administered encounter medications on file as of 01/31/2020.    PHYSICAL EXAM/ROS:  General: NAD, cooperative Cardiovascular: regular rate and rhythm; denies chest pain Pulmonary: clear ant/post fields, no cough, no SOB Abdomen: soft, nontender, + bowel sounds GU: no suprapubic tenderness Extremities: no edema, no joint deformities Skin: no rashes to exposed skin Neurological: Weakness but otherwise nonfocal  Teodoro Spray, NP

## 2020-02-02 DIAGNOSIS — E860 Dehydration: Secondary | ICD-10-CM | POA: Diagnosis not present

## 2020-02-02 DIAGNOSIS — F0280 Dementia in other diseases classified elsewhere without behavioral disturbance: Secondary | ICD-10-CM | POA: Diagnosis not present

## 2020-02-02 DIAGNOSIS — G309 Alzheimer's disease, unspecified: Secondary | ICD-10-CM | POA: Diagnosis not present

## 2020-02-02 DIAGNOSIS — N390 Urinary tract infection, site not specified: Secondary | ICD-10-CM | POA: Diagnosis not present

## 2020-02-02 DIAGNOSIS — E1165 Type 2 diabetes mellitus with hyperglycemia: Secondary | ICD-10-CM | POA: Diagnosis not present

## 2020-02-02 DIAGNOSIS — E876 Hypokalemia: Secondary | ICD-10-CM | POA: Diagnosis not present

## 2020-02-05 DIAGNOSIS — G309 Alzheimer's disease, unspecified: Secondary | ICD-10-CM | POA: Diagnosis not present

## 2020-02-05 DIAGNOSIS — E860 Dehydration: Secondary | ICD-10-CM | POA: Diagnosis not present

## 2020-02-05 DIAGNOSIS — E1165 Type 2 diabetes mellitus with hyperglycemia: Secondary | ICD-10-CM | POA: Diagnosis not present

## 2020-02-05 DIAGNOSIS — N390 Urinary tract infection, site not specified: Secondary | ICD-10-CM | POA: Diagnosis not present

## 2020-02-05 DIAGNOSIS — E876 Hypokalemia: Secondary | ICD-10-CM | POA: Diagnosis not present

## 2020-02-05 DIAGNOSIS — F0280 Dementia in other diseases classified elsewhere without behavioral disturbance: Secondary | ICD-10-CM | POA: Diagnosis not present

## 2020-02-06 ENCOUNTER — Inpatient Hospital Stay (HOSPITAL_BASED_OUTPATIENT_CLINIC_OR_DEPARTMENT_OTHER): Payer: Medicare Other | Admitting: Oncology

## 2020-02-06 ENCOUNTER — Other Ambulatory Visit: Payer: Self-pay

## 2020-02-06 VITALS — BP 100/52 | HR 78 | Temp 99.2°F | Resp 18 | Ht 69.0 in | Wt 156.6 lb

## 2020-02-06 DIAGNOSIS — Z85828 Personal history of other malignant neoplasm of skin: Secondary | ICD-10-CM | POA: Diagnosis not present

## 2020-02-06 DIAGNOSIS — C7989 Secondary malignant neoplasm of other specified sites: Secondary | ICD-10-CM | POA: Diagnosis not present

## 2020-02-06 DIAGNOSIS — C679 Malignant neoplasm of bladder, unspecified: Secondary | ICD-10-CM | POA: Diagnosis not present

## 2020-02-06 DIAGNOSIS — F0390 Unspecified dementia without behavioral disturbance: Secondary | ICD-10-CM | POA: Diagnosis not present

## 2020-02-06 DIAGNOSIS — Z923 Personal history of irradiation: Secondary | ICD-10-CM | POA: Diagnosis not present

## 2020-02-06 NOTE — Progress Notes (Signed)
Hematology and Oncology Follow Up Visit  [name] [name] [id number] 1940-10-22 79 y.o. 02/06/2020 4:03 PM [name] [name], MDRamachandran, [name], MD   Principle Diagnosis: 79 year old man with bladder cancer diagnosed in March 2020.  He was found to have T2N0 high-grade urothelial carcinoma.     Prior Therapy:   He is status post cystoscopy and repeat TURBT on May 4 of 2020 under the care of Dr. Alinda Money.  Which showed no muscle invasion.  He is status post TURBT on August 29, 2018 which showed muscle invasive high-grade urothelial carcinoma.  He is status post radiation and weekly carboplatin with cycle 1 to be given on 11/15/2018.  He completed 6 cycles of therapy on 12/27/2018.  He is status post parotidectomy, neck dissection and skin flap on June 12, 2019 after presenting with infra-auricular squamous cell carcinoma.  He status post radiation therapy to the neck and the parotid gland completed in April 2021.  He received a total of 66 Gy under the care of Dr. Isidore Moos.  Current therapy: Active surveillance.  Interim History: [name] is here for a follow-up evaluation.  Since the last visit, his wife reports improvement in his health since his last hospitalization.  He was hospitalized back in July and is recovering at this time.  He had exacerbation of his dementia which has worsened after his hospitalization and has palliative care services assisting.  He denies any specific complaints at this time.  Does report some discomfort associated with his parotid surgery and radiation.         Medications: Unchanged on review. Current Outpatient Medications  Medication Sig Dispense Refill  . dutasteride (AVODART) 0.5 MG capsule Take 0.5 mg by mouth daily.     [name] HUMALOG KWIKPEN 100 UNIT/ML KwikPen Inject into the skin 3 (three) times daily as needed (high blood sugar). Blood Glucose >300    . memantine (NAMENDA XR) 28 MG CP24 24 hr capsule Take 28 mg by mouth every morning.      . metFORMIN (GLUCOPHAGE-XR) 750 MG 24 hr tablet Take 750 mg by mouth daily.    . metoprolol succinate (TOPROL-XL) 25 MG 24 hr tablet Take 25 mg by mouth daily.     . Multiple Vitamin (MULTIVITAMIN WITH MINERALS) TABS tablet Take 1 tablet by mouth every evening.    . mupirocin ointment (BACTROBAN) 2 % Apply topically 2 (two) times daily.    . Omega-3 Fatty Acids (FISH OIL) 1200 MG CAPS Take 1,200 mg by mouth every evening.    [name] DELICA LANCETS 78L MISC 2 (two) times daily. as directed  4  . sitaGLIPtin (JANUVIA) 100 MG tablet Take 100 mg by mouth daily.    . vitamin B-12 (CYANOCOBALAMIN) 500 MCG tablet Take 500 mcg by mouth every evening.     No current facility-administered medications for this visit.     Allergies:  Allergies  Allergen Reactions  . Sulfa Antibiotics Hives  . Tetracyclines & Related Rash  . Aricept [Donepezil Hcl] Nausea And Vomiting        Physical Exam:  Blood pressure (!) 100/52, pulse 78, temperature 99.2 F (37.3 C), temperature source Tympanic, resp. rate 18, height 5\' 9"  (1.753 m), weight 156 lb 9.6 oz (71 kg), SpO2 100 %.   ECOG: 1    General appearance: Alert, awake without any distress. Head: Atraumatic without abnormalities Oropharynx: Without any thrush or ulcers. Eyes: No scleral icterus. Lymph nodes: No lymphadenopathy noted in the cervical, supraclavicular, or axillary nodes Heart:regular rate and  rhythm, without any murmurs or gallops.   Lung: Clear to auscultation without any rhonchi, wheezes or dullness to percussion. Abdomin: Soft, nontender without any shifting dullness or ascites. Musculoskeletal: No clubbing or cyanosis. Neurological: No motor or sensory deficits. Skin: Erythema noted around his left neck and a nonhealing ulcer noted.  No drainage or discharge.          Lab Results: Lab Results  Component Value Date   WBC 6.3 01/30/2020   HGB 9.6 (L) 01/30/2020   HCT 29.6 (L) 01/30/2020   MCV 85.8 01/30/2020    PLT 206 01/30/2020     Chemistry      Component Value Date/Time   NA 140 01/30/2020 0932   K 4.4 01/30/2020 0932   CL 102 01/30/2020 0932   CO2 30 01/30/2020 0932   BUN 18 01/30/2020 0932   CREATININE 1.06 01/30/2020 0932      Component Value Date/Time   CALCIUM 9.8 01/30/2020 0932   ALKPHOS 66 01/30/2020 0932   AST 10 (L) 01/30/2020 0932   ALT 6 01/30/2020 0932   BILITOT 0.4 01/30/2020 0932       IMPRESSION: 1. Filling defect within the RIGHT renal pelvis on delayed imaging is favored represent debris or clot. Malignancy is less favored. 2. Small filling defect within the bladder adjacent to the RIGHT vesicoureteral junction is indeterminate. This could also represent clot or debris. 3. No enhancing renal cortical lesion. No enhancing bladder lesion identified 4. Mild RIGHT pelvicaliectasis.  No hydroureter. 5. LEFT kidney normal. 6. No evidence of metastatic disease on chest abdomen pelvis CT.    Impression and Plan:  79 year old with:  1.  Bladder cancer diagnosed in March 2020.  He was found to have T2N0 high-grade urothelial carcinoma.   The natural course of this disease was reviewed at this time and risk of relapse was assessed.  CT scan obtained on January 30, 2020 was personally reviewed and showed no evidence of metastatic disease.  [name] utility of continued active surveillance with imaging studies were discussed today.  Given his overall cognitive decline and poor candidacy for any additional anticancer treatment, the utility of the scans are questionable.  She continues to have mental decline and failure to thrive in the future, I would suspend all cancer treatment.  His wife is favoring this approach at this time.  The plan is doing well reassess him in 6 months and decide whether further imaging will be needed.   2.  Dementia: He continues to be overall thriving but his memory and overall functional decline continues.   3.  Squamous cell carcinoma  of the skin diagnosed in December 2020: Status post surgical resection followed by radiation therapy.  Continues to follow with dermatology regarding this issue.  4.  Follow-up: He will return in 6 months for repeat evaluation.   30  minutes were dedicated to this visit.  The time was spent on reviewing imaging studies, discussing disease status and reviewing the risks and benefits of continued active surveillance.    Zola Button, MD 8/24/20214:03 PM

## 2020-02-07 ENCOUNTER — Telehealth: Payer: Self-pay | Admitting: Oncology

## 2020-02-07 NOTE — Telephone Encounter (Signed)
Scheduled appointment per 8/24 los. Left message for patient with appointment date and time.  

## 2020-02-12 DIAGNOSIS — E876 Hypokalemia: Secondary | ICD-10-CM | POA: Diagnosis not present

## 2020-02-12 DIAGNOSIS — G309 Alzheimer's disease, unspecified: Secondary | ICD-10-CM | POA: Diagnosis not present

## 2020-02-12 DIAGNOSIS — E1165 Type 2 diabetes mellitus with hyperglycemia: Secondary | ICD-10-CM | POA: Diagnosis not present

## 2020-02-12 DIAGNOSIS — F0280 Dementia in other diseases classified elsewhere without behavioral disturbance: Secondary | ICD-10-CM | POA: Diagnosis not present

## 2020-02-12 DIAGNOSIS — N390 Urinary tract infection, site not specified: Secondary | ICD-10-CM | POA: Diagnosis not present

## 2020-02-12 DIAGNOSIS — E860 Dehydration: Secondary | ICD-10-CM | POA: Diagnosis not present

## 2020-02-20 DIAGNOSIS — R8279 Other abnormal findings on microbiological examination of urine: Secondary | ICD-10-CM | POA: Diagnosis not present

## 2020-02-20 DIAGNOSIS — N131 Hydronephrosis with ureteral stricture, not elsewhere classified: Secondary | ICD-10-CM | POA: Diagnosis not present

## 2020-02-20 DIAGNOSIS — Z8551 Personal history of malignant neoplasm of bladder: Secondary | ICD-10-CM | POA: Diagnosis not present

## 2020-02-21 DIAGNOSIS — E876 Hypokalemia: Secondary | ICD-10-CM | POA: Diagnosis not present

## 2020-02-21 DIAGNOSIS — N390 Urinary tract infection, site not specified: Secondary | ICD-10-CM | POA: Diagnosis not present

## 2020-02-21 DIAGNOSIS — E860 Dehydration: Secondary | ICD-10-CM | POA: Diagnosis not present

## 2020-02-21 DIAGNOSIS — G309 Alzheimer's disease, unspecified: Secondary | ICD-10-CM | POA: Diagnosis not present

## 2020-02-21 DIAGNOSIS — E1165 Type 2 diabetes mellitus with hyperglycemia: Secondary | ICD-10-CM | POA: Diagnosis not present

## 2020-02-21 DIAGNOSIS — F0280 Dementia in other diseases classified elsewhere without behavioral disturbance: Secondary | ICD-10-CM | POA: Diagnosis not present

## 2020-02-26 ENCOUNTER — Other Ambulatory Visit: Payer: Self-pay

## 2020-02-26 ENCOUNTER — Other Ambulatory Visit: Payer: Medicare Other | Admitting: Hospice

## 2020-02-26 DIAGNOSIS — F0280 Dementia in other diseases classified elsewhere without behavioral disturbance: Secondary | ICD-10-CM | POA: Diagnosis not present

## 2020-02-26 DIAGNOSIS — E860 Dehydration: Secondary | ICD-10-CM | POA: Diagnosis not present

## 2020-02-26 DIAGNOSIS — E876 Hypokalemia: Secondary | ICD-10-CM | POA: Diagnosis not present

## 2020-02-26 DIAGNOSIS — Z515 Encounter for palliative care: Secondary | ICD-10-CM

## 2020-02-26 DIAGNOSIS — F0391 Unspecified dementia with behavioral disturbance: Secondary | ICD-10-CM

## 2020-02-26 DIAGNOSIS — N390 Urinary tract infection, site not specified: Secondary | ICD-10-CM | POA: Diagnosis not present

## 2020-02-26 DIAGNOSIS — E1165 Type 2 diabetes mellitus with hyperglycemia: Secondary | ICD-10-CM | POA: Diagnosis not present

## 2020-02-26 DIAGNOSIS — C4440 Unspecified malignant neoplasm of skin of scalp and neck: Secondary | ICD-10-CM

## 2020-02-26 DIAGNOSIS — G309 Alzheimer's disease, unspecified: Secondary | ICD-10-CM | POA: Diagnosis not present

## 2020-02-26 NOTE — Progress Notes (Signed)
[facility] Consult Note Telephone: [phone]  Fax: [phone]  PATIENT NAME: [name] [name] DOB: 01-13-1941 MRN: [id number]  PRIMARY CARE PROVIDER:   [name], MD [name], [place],  [place] [address]  REFERRING PROVIDER: [name], MD [name], [place],   [address]  RESPONSIBLE PARTY:  [name] [phone]  TELEHEALTH VISIT STATEMENT Due to the COVID-19 crisis, this visit was done via telephone from my office. It was initiated and consented to by this patient and/or family.   RECOMMENDATIONS/PLAN:   Advance Care Planning/Goals of Care: Visit to build trust and follow-up on palliative care.  Visit consisted of building trust and discussions on Palliative Medicine as specialized medical care for people living with serious illness, aimed at facilitating better quality of life through symptoms relief, assisting with advance care plan and establishing goals of care.  Code Status:  Reviewed CODE STATUS.  Patient is a DO NOT RESUSCITATE.  Signed DNR form in epic. Goals of Care: Goals of care include to maximize quality of life and symptom management.   [name] affirmed that family does not want to pursue Chemo or  Radiation; family is interested in  Hospice services in the future.  Spouse interested in further discussions on medical orders for scope of treatment in next visit.  Follow up: Palliative care will continue to follow patient for goals of care clarification and symptom management. Next follow in 6 weeks per family request to evaluate for hospice eligibility.  [name] prefers telehealth visits at this time due to the ongoing COVID-19 pandemic. Symptom management /Cognitive / Functional decline: :  Patient is currently undergoing physical therapy for weakness gait imbalance.  Spouse reports patient sleeping more than  before; he sleeps all night and a good part of the day; patient sleeping up to 16 hours a day, up from 10 hours a day in the last month.  No report of pain/discomfort, patient in no medical acuity.   Ongoing memory loss and confusion at baseline. Dementia FAST 6c, FLACC 0. A1c 6.2 four months ago. Continues on Januvia and Metformin.  Ongoing weight loss and decline in functional status related to bladder CA and CA of skin of neck, s/p radiation and chemo.  Chart review shows recent CT neck/thyroid w contrast 01/16/2020 shows some abnormal areas of linear enhancement in the left floor of mouth suspicious for tumor extension along the hypoglossal area.   Bladder Stent repair scheduled for 03/11/2020.  Patient has follow-up appointment with oncologist but spouse said patient not likely to go because family no longer interested in any aggressive treatment. Patient is compliant with his medications. NP encouraged ongoing care.  Family /Caregiver/Community Supports:  Patient lives at home with [name] who is the primary caregiver and very involved in his care.  [name] faith and family support are key factors helping spouse/patient to cope with his health challenges. Strong support system identified.  I spent  48  minutes providing this consultation; time iincludes time spent with patient/family, chart review,   and documentation. More than 50% of the time in this consultation was spent on coordinating communication  HISTORY OF PRESENT ILLNESS:  [name] [name] is a 79 y.o. year old male with multiple medical problems including Dementia , HTN, Type2DM, hx of bladder cancer, cancer of skin of neck s/p chemo and radiation. . Palliative Care was asked to help address goals of  care.   CODE STATUS: DNR  PPS: 50% HOSPICE ELIGIBILITY/DIAGNOSIS: TBD  PAST MEDICAL HISTORY:  Past Medical History:  Diagnosis Date  . Alzheimer disease ([facility])   . Alzheimer's disease ([facility])   . Bilateral cold feet   .  Bladder cancer ([facility])    dx in 2013.07/08/19 skin CA in neck  . BPH (benign prostatic hypertrophy)   . Dementia ([facility]) 12-11-11   memory short term(steadily progressive worsening-pt. unaware) .dx. Alzheimers  . Diabetes mellitus 12-11-11   oral meds only type II   . 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 Hard of hearing   . Hearing loss    has one hearing aid, doesn't wear  . History of chemotherapy   . History of gallstones 2005  . History of kidney stones   . Hx of nonmelanoma skin cancer   . Hypercholesterolemia   . Hypertension 12-11-11   controlled with meds  . Increased frequency of urination    wife reports urinary frequency has not improved since last surgery   . Personal history of radiation exposure   . Pulmonary nodules    Bilateral  . Pyelonephritis    hospital admission july 2020  . Renal calculus, left 10/12/2013  . Urinary urgency   . UTI (urinary tract infection) 08-09-13   multiple, last tx. 1 week ago    SOCIAL HX:  Social History   Tobacco Use  . Smoking status: Never Smoker  . Smokeless tobacco: Never Used  Substance Use Topics  . Alcohol use: No    ALLERGIES:  Allergies  Allergen Reactions  . Sulfa Antibiotics Hives  . Tetracyclines & Related Rash  . Aricept [Donepezil Hcl] Nausea And Vomiting     PERTINENT MEDICATIONS:  Outpatient Encounter Medications as of 02/26/2020  Medication Sig  . dutasteride (AVODART) 0.5 MG capsule Take 0.5 mg by mouth daily.   . hydrochlorothiazide (MICROZIDE) 12.5 MG capsule Take 12.5 mg by mouth daily.  . memantine (NAMENDA XR) 28 MG CP24 24 hr capsule Take 28 mg by mouth daily.   . metFORMIN (GLUCOPHAGE-XR) 750 MG 24 hr tablet Take 750 mg by mouth every evening.   . metoprolol succinate (TOPROL-XL) 25 MG 24 hr tablet Take 25 mg by mouth daily.   . Multiple Vitamin (MULTIVITAMIN WITH MINERALS) TABS tablet Take 1 tablet by mouth every evening.  . Omega-3 Fatty Acids (FISH OIL) 1200 MG  CAPS Take 1,200 mg by mouth every evening.  [name] DELICA LANCETS 16B MISC 2 (two) times daily. as directed  . perindopril (ACEON) 4 MG tablet Take 4 mg by mouth daily.  . sitaGLIPtin (JANUVIA) 100 MG tablet Take 100 mg by mouth daily.  . vitamin B-12 (CYANOCOBALAMIN) 1000 MCG tablet Take 1,000 mcg by mouth daily.   No facility-administered encounter medications on file as of 02/26/2020.    Teodoro Spray, NP

## 2020-02-27 DIAGNOSIS — C7989 Secondary malignant neoplasm of other specified sites: Secondary | ICD-10-CM | POA: Diagnosis not present

## 2020-02-27 DIAGNOSIS — Z08 Encounter for follow-up examination after completed treatment for malignant neoplasm: Secondary | ICD-10-CM | POA: Diagnosis not present

## 2020-02-27 DIAGNOSIS — Z9889 Other specified postprocedural states: Secondary | ICD-10-CM | POA: Diagnosis not present

## 2020-02-27 DIAGNOSIS — Z85818 Personal history of malignant neoplasm of other sites of lip, oral cavity, and pharynx: Secondary | ICD-10-CM | POA: Diagnosis not present

## 2020-02-28 NOTE — Progress Notes (Signed)
DUE TO COVID-19 ONLY ONE VISITOR IS ALLOWED TO COME WITH YOU AND STAY IN THE WAITING ROOM ONLY DURING PRE OP AND PROCEDURE DAY OF SURGERY. THE 1 VISITOR  MAY VISIT WITH YOU AFTER SURGERY IN YOUR PRIVATE ROOM DURING VISITING HOURS ONLY!  YOU NEED TO HAVE A COVID 19 TEST ON___9/23/21 ____ @_______ , THIS TEST MUST BE DONE BEFORE SURGERY,  COVID TESTING SITE [address] [place] [address], IT IS ON THE RIGHT GOING OUT [address] APPROXIMATELY  2 MINUTES PAST [employer] ON THE RIGHT. ONCE YOUR COVID TEST IS COMPLETED,  PLEASE BEGIN THE QUARANTINE INSTRUCTIONS AS OUTLINED IN YOUR HANDOUT.                [name] [name]  02/28/2020   Your procedure is scheduled on: 03/11/20    Report to [facility] Main  Entrance   Report to admitting at     1145 AM     Call this number if you have problems the morning of surgery [phone]    Remember: Do not eat food , candy gum or mints :After Midnight. You may have clear liquids from midnight until   1045am    CLEAR LIQUID DIET   Foods Allowed                                                                       Coffee and tea, regular and decaf                              Plain Jell-O any favor except red or purple                                            Fruit ices (not with fruit pulp)                                      Iced Popsicles                                     Carbonated beverages, regular and diet                                    Cranberry, grape and apple juices Sports drinks like Gatorade Lightly seasoned clear broth or consume(fat free) Sugar, honey syrup   _____________________________________________________________________    BRUSH YOUR TEETH MORNING OF SURGERY AND RINSE YOUR MOUTH OUT, NO CHEWING GUM CANDY OR MINTS.     Take these medicines the morning of surgery with A SIP OF WATER:  Avodart, namenda, toprol DO NOT TAKE ANY DIABETIC MEDICATIONS DAY OF YOUR SURGERY                                You may not have any  metal on your body including hair pins and              piercings  Do not wear jewelry, make-up, lotions, powders or perfumes, deodorant             Do not wear nail polish on your fingernails.  Do not shave  48 hours prior to surgery.              Men may shave face and neck.   Do not bring valuables to the hospital. [facility].  Contacts, dentures or bridgework may not be worn into surgery.  Leave suitcase in the car. After surgery it may be brought to your room.     Patients discharged the day of surgery will not be allowed to drive home. IF YOU ARE HAVING SURGERY AND GOING HOME THE SAME DAY, YOU MUST HAVE AN ADULT TO DRIVE YOU HOME AND BE WITH YOU FOR 24 HOURS. YOU MAY GO HOME BY TAXI OR UBER OR ORTHERWISE, BUT AN ADULT MUST ACCOMPANY YOU HOME AND STAY WITH YOU FOR 24 HOURS.  Name and phone number of your driver:               Please read over the following fact sheets you were given: _____________________________________________________________________  [facility] - Preparing for Surgery Before surgery, you can play an important role.  Because skin is not sterile, your skin needs to be as free of germs as possible.  You can reduce the number of germs on your skin by washing with CHG (chlorahexidine gluconate) soap before surgery.  CHG is an antiseptic cleaner which kills germs and bonds with the skin to continue killing germs even after washing. Please DO NOT use if you have an allergy to CHG or antibacterial soaps.  If your skin becomes reddened/irritated stop using the CHG and inform your nurse when you arrive at Short Stay. Do not shave (including legs and underarms) for at least 48 hours prior to the first CHG shower.  You may shave your face/neck. Please follow these instructions carefully:  1.  Shower with CHG Soap the night before surgery and the  morning of Surgery.  2.  If you choose to  wash your hair, wash your hair first as usual with your  normal  shampoo.  3.  After you shampoo, rinse your hair and body thoroughly to remove the  shampoo.                           4.  Use CHG as you would any other liquid soap.  You can apply chg directly  to the skin and wash                       Gently with a scrungie or clean washcloth.  5.  Apply the CHG Soap to your body ONLY FROM THE NECK DOWN.   Do not use on face/ open                           Wound or open sores. Avoid contact with eyes, ears mouth and genitals (private parts).                       Wash face,  Development worker, international aid (private  parts) with your normal soap.             6.  Wash thoroughly, paying special attention to the area where your surgery  will be performed.  7.  Thoroughly rinse your body with warm water from the neck down.  8.  DO NOT shower/wash with your normal soap after using and rinsing off  the CHG Soap.                9.  Pat yourself dry with a clean towel.            10.  Wear clean pajamas.            11.  Place clean sheets on your bed the night of your first shower and do not  sleep with pets. Day of Surgery : Do not apply any lotions/deodorants the morning of surgery.  Please wear clean clothes to the hospital/surgery center.  FAILURE TO FOLLOW THESE INSTRUCTIONS MAY RESULT IN THE CANCELLATION OF YOUR SURGERY PATIENT SIGNATURE_________________________________  NURSE SIGNATURE__________________________________  ________________________________________________________________________

## 2020-02-29 ENCOUNTER — Encounter (HOSPITAL_COMMUNITY)
Admission: RE | Admit: 2020-02-29 | Discharge: 2020-02-29 | Disposition: A | Discharge status: Home / Self Care | Payer: Medicare Other | Source: Ambulatory Visit | Attending: Urology | Admitting: Urology

## 2020-02-29 ENCOUNTER — Other Ambulatory Visit: Payer: Self-pay

## 2020-02-29 ENCOUNTER — Encounter (HOSPITAL_COMMUNITY): Payer: Self-pay

## 2020-02-29 ENCOUNTER — Encounter (HOSPITAL_COMMUNITY): Admission: RE | Admit: 2020-02-29 | Payer: Medicare Other | Source: Ambulatory Visit

## 2020-02-29 DIAGNOSIS — Z01812 Encounter for preprocedural laboratory examination: Secondary | ICD-10-CM | POA: Diagnosis not present

## 2020-02-29 LAB — BASIC METABOLIC PANEL
Anion gap: 11 (ref 5–15)
BUN: 28 mg/dL — ABNORMAL HIGH (ref 8–23)
CO2: 27 mmol/L (ref 22–32)
Calcium: 9 mg/dL (ref 8.9–10.3)
Chloride: 101 mmol/L (ref 98–111)
Creatinine, Ser: 1.29 mg/dL — ABNORMAL HIGH (ref 0.61–1.24)
GFR calc Af Amer: >60 mL/min (ref >60)
GFR calc non Af Amer: 53 mL/min — ABNORMAL LOW (ref >60)
Glucose, Bld: 280 mg/dL — ABNORMAL HIGH (ref 70–99)
Potassium: 4.3 mmol/L (ref 3.5–5.1)
Sodium: 139 mmol/L (ref 135–145)

## 2020-02-29 LAB — CBC
HCT: 29.9 % — ABNORMAL LOW (ref 39.0–52.0)
Hemoglobin: 9.8 g/dL — ABNORMAL LOW (ref 13.0–17.0)
MCH: 28.1 pg (ref 26.0–34.0)
MCHC: 32.8 g/dL (ref 30.0–36.0)
MCV: 85.7 fL (ref 80.0–100.0)
Platelets: 224 10*3/uL (ref 150–400)
RBC: 3.49 MIL/uL — ABNORMAL LOW (ref 4.22–5.81)
RDW: 13.9 % (ref 11.5–15.5)
WBC: 6.1 10*3/uL (ref 4.0–10.5)
nRBC: 0 % (ref 0.0–0.2)

## 2020-02-29 LAB — HEMOGLOBIN A1C
Hgb A1c MFr Bld: 7.7 % — ABNORMAL HIGH (ref 4.8–5.6)
Mean Plasma Glucose: 174.29 mg/dL

## 2020-02-29 NOTE — Progress Notes (Signed)
Cbc done 02/29/2020 faxed via epic to DR Alinda Money.

## 2020-02-29 NOTE — Progress Notes (Addendum)
Anesthesia Review:  PCP: DR Charlyn Minerva 10/2019 per wife  Cardiologist : Chest x-ray : 01/30/2020 - CT of chest  EKG : 06/05/19- have requested by fax from [facility]  Echo : Stress test: Cardiac Cath :  Activity level: - cannot do a fight of staors without difficulty  Sleep Study/ CPAP : Fasting Blood Sugar :      / Checks Blood Sugar -- times a day:   Wife reports not checking glucose daily . Patient ate lunch at 1230pm prior to preop appt on 02/29/20 at 200pm.   Blood Thinner/ Instructions Maryjane Hurter Dose: ASA / Instructions/ Last Dose :  DM- type 2 hgb a1c done 02/29/20-7.7-epic  After this upcoming surgery wife reports that pt will have ear ear surgery  CBC and BMP done 02/29/2020 faxed via epic to DR Alinda Money.

## 2020-03-01 DIAGNOSIS — Z85828 Personal history of other malignant neoplasm of skin: Secondary | ICD-10-CM | POA: Diagnosis not present

## 2020-03-01 DIAGNOSIS — N1330 Unspecified hydronephrosis: Secondary | ICD-10-CM | POA: Diagnosis not present

## 2020-03-01 DIAGNOSIS — N390 Urinary tract infection, site not specified: Secondary | ICD-10-CM | POA: Diagnosis not present

## 2020-03-01 DIAGNOSIS — Z960 Presence of urogenital implants: Secondary | ICD-10-CM | POA: Diagnosis not present

## 2020-03-01 DIAGNOSIS — E1165 Type 2 diabetes mellitus with hyperglycemia: Secondary | ICD-10-CM | POA: Diagnosis not present

## 2020-03-01 DIAGNOSIS — H9190 Unspecified hearing loss, unspecified ear: Secondary | ICD-10-CM | POA: Diagnosis not present

## 2020-03-01 DIAGNOSIS — E7800 Pure hypercholesterolemia, unspecified: Secondary | ICD-10-CM | POA: Diagnosis not present

## 2020-03-01 DIAGNOSIS — Z923 Personal history of irradiation: Secondary | ICD-10-CM | POA: Diagnosis not present

## 2020-03-01 DIAGNOSIS — Z9181 History of falling: Secondary | ICD-10-CM | POA: Diagnosis not present

## 2020-03-01 DIAGNOSIS — Z87442 Personal history of urinary calculi: Secondary | ICD-10-CM | POA: Diagnosis not present

## 2020-03-01 DIAGNOSIS — T451X5S Adverse effect of antineoplastic and immunosuppressive drugs, sequela: Secondary | ICD-10-CM | POA: Diagnosis not present

## 2020-03-01 DIAGNOSIS — Z794 Long term (current) use of insulin: Secondary | ICD-10-CM | POA: Diagnosis not present

## 2020-03-01 DIAGNOSIS — F0280 Dementia in other diseases classified elsewhere without behavioral disturbance: Secondary | ICD-10-CM | POA: Diagnosis not present

## 2020-03-01 DIAGNOSIS — Z9221 Personal history of antineoplastic chemotherapy: Secondary | ICD-10-CM | POA: Diagnosis not present

## 2020-03-01 DIAGNOSIS — G309 Alzheimer's disease, unspecified: Secondary | ICD-10-CM | POA: Diagnosis not present

## 2020-03-01 DIAGNOSIS — I10 Essential (primary) hypertension: Secondary | ICD-10-CM | POA: Diagnosis not present

## 2020-03-01 DIAGNOSIS — N179 Acute kidney failure, unspecified: Secondary | ICD-10-CM | POA: Diagnosis not present

## 2020-03-01 DIAGNOSIS — E860 Dehydration: Secondary | ICD-10-CM | POA: Diagnosis not present

## 2020-03-01 DIAGNOSIS — N400 Enlarged prostate without lower urinary tract symptoms: Secondary | ICD-10-CM | POA: Diagnosis not present

## 2020-03-01 DIAGNOSIS — Z8551 Personal history of malignant neoplasm of bladder: Secondary | ICD-10-CM | POA: Diagnosis not present

## 2020-03-01 DIAGNOSIS — E876 Hypokalemia: Secondary | ICD-10-CM | POA: Diagnosis not present

## 2020-03-01 DIAGNOSIS — T8189XD Other complications of procedures, not elsewhere classified, subsequent encounter: Secondary | ICD-10-CM | POA: Diagnosis not present

## 2020-03-05 DIAGNOSIS — E1165 Type 2 diabetes mellitus with hyperglycemia: Secondary | ICD-10-CM | POA: Diagnosis not present

## 2020-03-05 DIAGNOSIS — F0280 Dementia in other diseases classified elsewhere without behavioral disturbance: Secondary | ICD-10-CM | POA: Diagnosis not present

## 2020-03-05 DIAGNOSIS — G309 Alzheimer's disease, unspecified: Secondary | ICD-10-CM | POA: Diagnosis not present

## 2020-03-05 DIAGNOSIS — E876 Hypokalemia: Secondary | ICD-10-CM | POA: Diagnosis not present

## 2020-03-07 ENCOUNTER — Other Ambulatory Visit (HOSPITAL_COMMUNITY)
Admission: RE | Admit: 2020-03-07 | Discharge: 2020-03-07 | Disposition: A | Discharge status: Home / Self Care | Payer: Medicare Other | Source: Ambulatory Visit | Attending: Urology | Admitting: Urology

## 2020-03-07 DIAGNOSIS — Z20822 Contact with and (suspected) exposure to covid-19: Secondary | ICD-10-CM | POA: Diagnosis not present

## 2020-03-07 DIAGNOSIS — Z01812 Encounter for preprocedural laboratory examination: Secondary | ICD-10-CM | POA: Diagnosis not present

## 2020-03-07 LAB — SARS CORONAVIRUS 2 (TAT 6-24 HRS): SARS Coronavirus 2: NEGATIVE

## 2020-03-07 NOTE — H&P (Signed)
Office Visit Report     02/20/2020   --------------------------------------------------------------------------------   [name] [name]  MRN: [id number]  DOB: [birth year], 79 year old Male  SSN: [id number]   PRIMARY CARE:  W Gareth Eagle, MD  REFERRING:  Arleta Creek, MD  PROVIDER:  Raynelle Bring, M.D.  TREATING:  Jiles Crocker, NP  LOCATION:  [facility] [phone]     --------------------------------------------------------------------------------   CC/HPI: 1. Bladder cancer  2. Right ureteral obstruction  3. UTI   01/02/2020: [name] [name] follows up today for further evaluation after being hospitalized over the weekend. He presented with weakness and dehydration that was felt to be potentially related to urinary tract infection. He was seen in consultation by Dr. Alyson Ingles. He was placed on cefdinir and is now on day 3 of treatment with improvement. His urine culture demonstrated multiple species. His most recent ureteral stent was changed on 10/30/2019. Overall, he has tolerated this relatively well. He is scheduled to have upcoming Mohs surgery for a new skin cancer located on the top of his head. In addition, he has scheduled follow-up for his squamous cell cancer as well as for his bladder cancer with his various oncology physicians in the next month or so. He has not had any recent hematuria.   02/20/2020: The patient returns today for preoperative appointment prior to undergoing cystoscopy as well as right ureteral stent exchange later this month with his urologist. Fortunately the patient has had no breakthrough signs/symptoms of recurrent UTI or require treatment in the interval for infectious process. His wife is here today with him and provides most of HPI as he some underlying cognitive decline from underlying dementia diagnosis. Per her report, the patient appears to be voiding at his baseline with stable symptomatology. No interval complaints of burning/painful  urination, gross hematuria. Appetite remains stable without nausea/vomiting. He denies constipation currently. No complaints of new/worsening lower back, flank, lower abdominal pain or discomfort.   He was recently evaluated by Dr Alen Blew late last month. He had CT imaging of the chest abdomen and pelvis performed in anticipation of that appointment. That showed no evidence of metastatic disease recurrence. There is plan to continue following the patient at 6 month intervals. No new treatments were recommended.     ALLERGIES: Aricept Nitrofurantoin Monohyd Macro CAPS Sulfa Drugs Tetracyclines    MEDICATIONS: Metoprolol Succinate 25 mg tablet, extended release 24 hr  Avodart 0.5 mg capsule 0 Oral  Cefdinir  Fish Oil CAPS Oral  Glipizide  Hydrochlorothiazide 25 mg tablet Oral  Januvia 100 mg tablet  Metformin Hcl Er 750 mg tablet, extended release 24 hr  Namenda Xr 28 mg capsule sprinkle, extended release 24 hr Oral  Perindopril Erbumine 4 MG Oral Tablet Oral  Vitamin B-12 100 mcg tablet Oral     GU PSH: Bladder Instill AntiCA Agent - 2016 Catheterize For Residual - 03/23/2018 Cysto Bladder Ureth Biopsy - 2018 Cysto Uretero Lithotripsy - 2010 Cystoscopy - 2020, 03/23/2018, 2019, 2018, 2018, 2018 Cystoscopy Fulguration - 2010 Cystoscopy Insert Stent, Right - 10/30/2019, Right - 08/07/2019, Right - 05/08/2019, Right - 01/26/2019, Right - 10/17/2018, Right - 08/29/2018, 2015, 2010, 2010, 2008 Cystoscopy TURBT <2 cm - 08/29/2018, 2019, 2018, 2017, 2015, 2013 Cystoscopy TURBT 2-5 cm - 10/17/2018, 2017, 2016, 2016 Cystoscopy Ureteroscopy, Right - 10/17/2018, Right - 08/29/2018, Left - 2018, 2015 Locm 300-399Mg /Ml Iodine,1Ml - 09/21/2018 Percut Stone Removal >2cm - 2015 Ureteroscopic stone removal - 2008       PSH  Notes: Cystoscopy With Fulguration Medium Lesion (2-5cm), Cystoscopy With Fulguration Medium Lesion (2-5cm), Bladder Injection Of Cancer Treatment, Percutaneous Lithotomy For Stone Over 2cm.,  Cystoscopy With Insertion Of Ureteral Stent Left, Cystoscopy With Ureteroscopy Left, Cystoscopy With Fulguration Small Lesion (5-71mm), Cystoscopy With Fulguration Small Lesion (5-31mm), Cystoscopy With Pyeloscopy With Lithotripsy, Cystoscopy With Insertion Of Ureteral Stent Right, Cystoscopy With Fulguration, Cystoscopy With Insertion Of Ureteral Stent Right, Cystoscopy With Ureteroscopy With Removal Of Calculus, Cystoscopy With Insertion Of Ureteral Stent Left   NON-GU PSH: No Non-GU PSH    GU PMH: Acute Cystitis/UTI - 01/02/2020 History of bladder cancer - 01/02/2020 Urinary Urgency (Stable) - 07/18/2019, Urinary urgency, - 2016 Ureteral obstruction - 09/20/2018 Urinary Frequency - 09/20/2018 Bladder Cancer overlapping sites - 2017, Malignant neoplasm of overlapping sites of bladder, - 2017 BPH w/LUTS, Benign prostatic hyperplasia (BPH) with straining on urination - 2016 Renal calculus, Nephrolithiasis - 2016      PMH Notes:   [name] [name] is a patient previously followed by Dr. Tresa Endo. 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 He was noted to have a recurrence in 2013 and underwent TURBT in July 2013. He was also incidentally noted to have a stricture of the distal left ureter on RPG studies at that time and a brush biopsy was performed. He was found to have high grade Ta urothelial carcinoma in February 2016.    Oct 2007: TURBT, Low grade Ta  Jul 2013: TURBT (right bladder neck 1.5 cm tumor, low grade Ta),brush biopsy of distal left ureteral stricture (no malignancy)  Mar 2015: TURBT - Low grade Ta  Feb 2016: TURBT - High grade Ta, [facility]  Mar-Apr 2016: Induction BCG  Jul 2016: Maintenance BCG  Oct 2016: Multiple recurrent tumors noted on surveillance cystoscopy  Dec 2016: TUR - Multiple tumors over right posterior bladder - High grade, Ta  Dec 2016 -  Feb 2017: 6 week induction BCG  May 2017: TURBT - High grade, Ta urothelial carcinoma (two tumors)  Oct 2017: Completed induction 6 week course of intravesical gemcitabine [facility] - Dr. Ricky Ala)  Apr 2018: Two small recurrences (each < 1 cm)  Apr 2018: TURBT- High grade Ta, concern for possible lamina propria invasion for tumor at dome  May 2018: Repeat TURBT - No residual malignancy  Jul-Aug 2018: Repeat induction 6 week course of intravesical gemcitabine [facility]- Dr. Ricky Ala)  Sep 2018 - Apr 2019: Monthly maintenance gemcitabine  May 2019: Blue light cystoscopy with TURBT for 2 recurrent tumors posteriorly - High grade, Ta  Jun-Jul 2019: Repeat induction intravesical gemcitabine  Mar 2020: TURBT for two recurrent tumors - High grade, muscle invasive urothelial carcinoma with invasion into distal right ureter, right ureteral stent  Apr 2020: Patient and wife elected trimodality therapy (refused cystectomy due to severe dementia), repeat completion TURBT and right ureteral stent change  May-Jun 2020: XRT and carboplatin    2) Nephrolithiasis: He has a long standing history of calcium oxalate stones. He was incidentally noted to have a large partial staghorn calculus during retrograde pyelography in February 2015.  Apr 2015: L PCNL   3) Prostate cancer screening: We discontinued screening in 2014 due to his low risk for prostate cancer at that time and progressive dementia.  Last PSA: 0.35 (June 2014)   4) BPH/LUTS: He is treated with Avodart.   5) Right ureteral  obstruction: This was noted in March 2020 when he developed hydronephrosis due to progression of bladder cancer and development of muscle invasive disease. He began management with right ureteral stent in March 2020:   Mar 2020: R ureteral stent placement (encrusted within 6 weeks)  Apr 2020: R ureteral stent change (6 x 26 [employer])  Aug 2020: R ureteral stent change (6 x 26 [employer])  Nov 2020: R ureteral stent  change (6 x 26 [employer])  Feb 2021: R ureteral stent change (6 x 26 [employer])  May 2021: R ureteral stent change (6 x 26 [employer]) - only mild encrustation   NON-GU PMH: Bacteriuria (Stable) - 12/14/2018, (Stable), - 09/20/2018 (Stable), - 09/02/2018 (Stable), - 2018 (Stable), - 2018, - 2018 Diabetes Type 2 Hypertension Unspecified dementia without behavioral disturbance    FAMILY HISTORY: No pertinent family history - Other Prostate Cancer - No Family History   SOCIAL HISTORY: Marital Status: Married Preferred Language: English; Ethnicity: Not Hispanic Or Latino; Race: White Current Smoking Status: Patient has never smoked.  Has never drank.  Drinks 2 caffeinated drinks per day. Has not had a blood transfusion.     Notes: Never A Smoker   REVIEW OF SYSTEMS:    GU Review Male:   Patient denies frequent urination, hard to postpone urination, burning/ pain with urination, get up at night to urinate, leakage of urine, stream starts and stops, trouble starting your stream, have to strain to urinate , erection problems, and penile pain.  Gastrointestinal (Upper):   Patient denies nausea, vomiting, and indigestion/ heartburn.  Gastrointestinal (Lower):   Patient denies diarrhea and constipation.  Constitutional:   Patient denies fever, night sweats, weight loss, and fatigue.  Skin:   Patient denies skin rash/ lesion and itching.  Eyes:   Patient denies blurred vision and double vision.  Ears/ Nose/ Throat:   Patient denies sore throat and sinus problems.  Hematologic/Lymphatic:   Patient denies swollen glands and easy bruising.  Cardiovascular:   Patient denies leg swelling and chest pains.  Respiratory:   Patient denies cough and shortness of breath.  Endocrine:   Patient denies excessive thirst.  Musculoskeletal:   Patient denies back pain and joint pain.  Neurological:   Patient denies headaches and dizziness.  Psychologic:   Patient denies depression and  anxiety.   Notes: HPI provided by pt's wife.    VITAL SIGNS:      02/20/2020 01:02 PM  Weight 156 lb / 70.76 kg  Height 69 in / 175.26 cm  BP 98/56 mmHg  Pulse 65 /min  Temperature 97.1 F / 36.1 C  BMI 23.0 kg/m   MULTI-SYSTEM PHYSICAL EXAMINATION:    Constitutional: Well-nourished. No physical deformities. Normally developed. Good grooming.  Neck: Neck symmetrical, not swollen. Normal tracheal position.  Respiratory: No labored breathing, no use of accessory muscles.   Cardiovascular: Normal temperature, normal extremity pulses, no swelling, no varicosities.  Skin: No paleness, no jaundice, no cyanosis. No lesion, no ulcer, no rash.  Neurologic / Psychiatric: Sequelae of underlying dementia present. He appears alert and in NAD. He follows commands well. HPI provided by pt's wife.   Gastrointestinal: No mass, no tenderness, no rigidity, non obese abdomen.  Musculoskeletal: Normal gait and station of head and neck.     Complexity of Data:  Source Of History:  Patient, Family/Caregiver, Medical Record Summary  Records Review:   Previous Doctor Records, Previous Hospital Records, Previous Patient Records  Urine Test Review:   Urinalysis, Urine Culture  X-Ray Review: C.T. Abdomen/Pelvis: Reviewed Films. Reviewed Report.     11/23/12 11/18/11 11/28/10 01/08/10 12/20/03  PSA  Total PSA 0.35  0.42  0.46  0.49  2.42     02/20/20  Urinalysis  Urine Appearance Cloudy   Urine Color Yellow   Urine Glucose Trace mg/dL  Urine Bilirubin Neg mg/dL  Urine Ketones Neg mg/dL  Urine Specific Gravity 1.025   Urine Blood 2+ ery/uL  Urine pH 6.0   Urine Protein 1+ mg/dL  Urine Urobilinogen 0.2 mg/dL  Urine Nitrites Neg   Urine Leukocyte Esterase 3+ leu/uL  Urine WBC/hpf Packed/hpf   Urine RBC/hpf 10 - 20/hpf   Urine Epithelial Cells 0 - 5/hpf   Urine Bacteria Many (>50/hpf)   Urine Mucous Not Present   Urine Yeast Moderate (5 - 10/hpf)   Urine Trichomonas Not Present   Urine Cystals  NS (Not Seen)   Urine Casts NS (Not Seen)   Urine Sperm Not Present    PROCEDURES:          Urinalysis w/Scope Dipstick Dipstick Cont'd Micro  Color: Yellow Bilirubin: Neg mg/dL WBC/hpf: Packed/hpf  Appearance: Cloudy Ketones: Neg mg/dL RBC/hpf: 10 - 20/hpf  Specific Gravity: 1.025 Blood: 2+ ery/uL Bacteria: Many (>50/hpf)  pH: 6.0 Protein: 1+ mg/dL Cystals: NS (Not Seen)  Glucose: Trace mg/dL Urobilinogen: 0.2 mg/dL Casts: NS (Not Seen)    Nitrites: Neg Trichomonas: Not Present    Leukocyte Esterase: 3+ leu/uL Mucous: Not Present      Epithelial Cells: 0 - 5/hpf      Yeast: Moderate (5 - 10/hpf)      Sperm: Not Present    ASSESSMENT:      ICD-10 Details  1 GU:   History of bladder cancer - Z85.51 Chronic, Stable  2   Ureteral obstruction - N13.1 Chronic, Stable   PLAN:           Orders Labs Urine Culture          Schedule Return Visit/Planned Activity: Keep Scheduled Appointment - Follow up MD, Schedule Surgery          Document Letter(s):  Created for Patient: Clinical Summary         Notes:   Patient's baseline voiding symptoms are stable. He has not exhibiting acute signs/symptoms of an underlying infectious process. Despite some underlying dementia, his mental status and mentation remain stable. Patient had a reassuring recent exam with his oncologist. I will bring most recent CT imaging findings to the attention of his urologist prior to undergoing his planned cystoscopy and right ureteral stent exchange later this month. UA today did show some concerns for possibility of an underlying infectious process with packed pyuria, bacteria and yeast. He will likely require abx/anti-fungal treatment prior to the procedure but this may be able to be deferred until the week prior to the procedure. I'll determine appropriate prescription management once I have discussed further with his urologist.        Next Appointment:      Next Appointment: 02/20/2020 01:00 PM    Appointment  Type: Office Visit Established Patient    Location: [facility] [phone]    Provider: Jiles Crocker, NP    Reason for Visit: 6 WKS OV      * Signed by Jiles Crocker, NP on 02/20/20 at 1:30 PM (EDT)*

## 2020-03-08 DIAGNOSIS — F0280 Dementia in other diseases classified elsewhere without behavioral disturbance: Secondary | ICD-10-CM | POA: Diagnosis not present

## 2020-03-08 DIAGNOSIS — E860 Dehydration: Secondary | ICD-10-CM | POA: Diagnosis not present

## 2020-03-08 DIAGNOSIS — N390 Urinary tract infection, site not specified: Secondary | ICD-10-CM | POA: Diagnosis not present

## 2020-03-08 DIAGNOSIS — E876 Hypokalemia: Secondary | ICD-10-CM | POA: Diagnosis not present

## 2020-03-08 DIAGNOSIS — E1165 Type 2 diabetes mellitus with hyperglycemia: Secondary | ICD-10-CM | POA: Diagnosis not present

## 2020-03-08 DIAGNOSIS — G309 Alzheimer's disease, unspecified: Secondary | ICD-10-CM | POA: Diagnosis not present

## 2020-03-11 ENCOUNTER — Encounter (HOSPITAL_COMMUNITY): Payer: Self-pay | Admitting: Urology

## 2020-03-11 ENCOUNTER — Ambulatory Visit (HOSPITAL_COMMUNITY)
Admission: RE | Admit: 2020-03-11 | Discharge: 2020-03-11 | Disposition: A | Discharge status: Home / Self Care | Payer: Medicare Other | Attending: Urology | Admitting: Urology

## 2020-03-11 ENCOUNTER — Encounter (HOSPITAL_COMMUNITY)
Admission: RE | Disposition: A | Discharge status: Home / Self Care | Payer: Self-pay | Source: Home / Self Care | Attending: Urology

## 2020-03-11 ENCOUNTER — Ambulatory Visit (HOSPITAL_COMMUNITY): Payer: Medicare Other

## 2020-03-11 ENCOUNTER — Ambulatory Visit (HOSPITAL_COMMUNITY): Payer: Medicare Other | Admitting: Certified Registered Nurse Anesthetist

## 2020-03-11 ENCOUNTER — Ambulatory Visit (HOSPITAL_COMMUNITY): Payer: Medicare Other | Admitting: Physician Assistant

## 2020-03-11 DIAGNOSIS — Z7984 Long term (current) use of oral hypoglycemic drugs: Secondary | ICD-10-CM | POA: Diagnosis not present

## 2020-03-11 DIAGNOSIS — N131 Hydronephrosis with ureteral stricture, not elsewhere classified: Secondary | ICD-10-CM | POA: Diagnosis not present

## 2020-03-11 DIAGNOSIS — Z881 Allergy status to other antibiotic agents status: Secondary | ICD-10-CM | POA: Diagnosis not present

## 2020-03-11 DIAGNOSIS — F0390 Unspecified dementia without behavioral disturbance: Secondary | ICD-10-CM | POA: Diagnosis not present

## 2020-03-11 DIAGNOSIS — C672 Malignant neoplasm of lateral wall of bladder: Secondary | ICD-10-CM | POA: Diagnosis not present

## 2020-03-11 DIAGNOSIS — I10 Essential (primary) hypertension: Secondary | ICD-10-CM | POA: Diagnosis not present

## 2020-03-11 DIAGNOSIS — Z882 Allergy status to sulfonamides status: Secondary | ICD-10-CM | POA: Insufficient documentation

## 2020-03-11 DIAGNOSIS — Z923 Personal history of irradiation: Secondary | ICD-10-CM | POA: Diagnosis not present

## 2020-03-11 DIAGNOSIS — N401 Enlarged prostate with lower urinary tract symptoms: Secondary | ICD-10-CM | POA: Diagnosis not present

## 2020-03-11 DIAGNOSIS — Z8551 Personal history of malignant neoplasm of bladder: Secondary | ICD-10-CM | POA: Diagnosis not present

## 2020-03-11 DIAGNOSIS — N135 Crossing vessel and stricture of ureter without hydronephrosis: Secondary | ICD-10-CM | POA: Insufficient documentation

## 2020-03-11 DIAGNOSIS — E119 Type 2 diabetes mellitus without complications: Secondary | ICD-10-CM | POA: Diagnosis not present

## 2020-03-11 DIAGNOSIS — Z79899 Other long term (current) drug therapy: Secondary | ICD-10-CM | POA: Insufficient documentation

## 2020-03-11 DIAGNOSIS — Z888 Allergy status to other drugs, medicaments and biological substances status: Secondary | ICD-10-CM | POA: Diagnosis not present

## 2020-03-11 DIAGNOSIS — C4442 Squamous cell carcinoma of skin of scalp and neck: Secondary | ICD-10-CM | POA: Insufficient documentation

## 2020-03-11 DIAGNOSIS — R6520 Severe sepsis without septic shock: Secondary | ICD-10-CM | POA: Diagnosis not present

## 2020-03-11 DIAGNOSIS — A419 Sepsis, unspecified organism: Secondary | ICD-10-CM | POA: Diagnosis not present

## 2020-03-11 HISTORY — PX: CYSTOSCOPY WITH STENT PLACEMENT: SHX5790

## 2020-03-11 LAB — GLUCOSE, CAPILLARY
Glucose-Capillary: 214 mg/dL — ABNORMAL HIGH (ref 70–99)
Glucose-Capillary: 176 mg/dL — ABNORMAL HIGH (ref 70–99)

## 2020-03-11 LAB — BASIC METABOLIC PANEL
Anion gap: 13 (ref 5–15)
BUN: 20 mg/dL (ref 8–23)
CO2: 24 mmol/L (ref 22–32)
Calcium: 8.8 mg/dL — ABNORMAL LOW (ref 8.9–10.3)
Chloride: 100 mmol/L (ref 98–111)
Creatinine, Ser: 1.1 mg/dL (ref 0.61–1.24)
GFR calc Af Amer: >60 mL/min (ref >60)
GFR calc non Af Amer: >60 mL/min (ref >60)
Glucose, Bld: 221 mg/dL — ABNORMAL HIGH (ref 70–99)
Potassium: 3.9 mmol/L (ref 3.5–5.1)
Sodium: 137 mmol/L (ref 135–145)

## 2020-03-11 SURGERY — CYSTOSCOPY, WITH STENT INSERTION
Anesthesia: General | Laterality: Right

## 2020-03-11 MED ORDER — FENTANYL CITRATE (PF) 100 MCG/2ML IJ SOLN: 25.0000 ug | INTRAMUSCULAR | Status: DC | PRN

## 2020-03-11 MED ORDER — LACTATED RINGERS IV SOLN: INTRAVENOUS | Status: DC | PRN

## 2020-03-11 MED ORDER — EPHEDRINE SULFATE-NACL 50-0.9 MG/10ML-% IV SOSY
PREFILLED_SYRINGE | INTRAVENOUS | Status: DC | PRN
  Administered 2020-03-11 (×3): 10 mg via INTRAVENOUS

## 2020-03-11 MED ORDER — ONDANSETRON HCL 4 MG/2ML IJ SOLN
INTRAMUSCULAR | Status: DC | PRN
  Administered 2020-03-11: 4 mg via INTRAVENOUS

## 2020-03-11 MED ORDER — LIDOCAINE 2% (20 MG/ML) 5 ML SYRINGE
INTRAMUSCULAR | Status: DC | PRN
  Administered 2020-03-11: 30 mg via INTRAVENOUS
  Administered 2020-03-11: 70 mg via INTRAVENOUS

## 2020-03-11 MED ORDER — STERILE WATER FOR IRRIGATION IR SOLN
Status: DC | PRN
  Administered 2020-03-11: 3000 mL via INTRAVESICAL

## 2020-03-11 MED ORDER — ORAL CARE MOUTH RINSE: 15.0000 mL | Freq: Once | OROMUCOSAL | Status: AC

## 2020-03-11 MED ORDER — ONDANSETRON HCL 4 MG/2ML IJ SOLN
INTRAMUSCULAR | Status: AC
  Filled 2020-03-11: qty 2

## 2020-03-11 MED ORDER — SODIUM CHLORIDE 0.9 % IV SOLN
2.0000 g | Freq: Once | INTRAVENOUS | Status: AC
  Administered 2020-03-11: 2 g via INTRAVENOUS
  Filled 2020-03-11: qty 20

## 2020-03-11 MED ORDER — CHLORHEXIDINE GLUCONATE 0.12 % MT SOLN
15.0000 mL | Freq: Once | OROMUCOSAL | Status: AC
  Administered 2020-03-11: 15 mL via OROMUCOSAL

## 2020-03-11 MED ORDER — PROPOFOL 10 MG/ML IV BOLUS
INTRAVENOUS | Status: DC | PRN
  Administered 2020-03-11: 30 mg via INTRAVENOUS
  Administered 2020-03-11: 100 mg via INTRAVENOUS

## 2020-03-11 MED ORDER — LACTATED RINGERS IV SOLN: INTRAVENOUS | Status: DC

## 2020-03-11 MED ORDER — DEXAMETHASONE SODIUM PHOSPHATE 10 MG/ML IJ SOLN
INTRAMUSCULAR | Status: AC
  Filled 2020-03-11: qty 1

## 2020-03-11 MED ORDER — FENTANYL CITRATE (PF) 100 MCG/2ML IJ SOLN
INTRAMUSCULAR | Status: AC
  Filled 2020-03-11: qty 2

## 2020-03-11 MED ORDER — ONDANSETRON HCL 4 MG/2ML IJ SOLN: 4.0000 mg | Freq: Once | INTRAMUSCULAR | Status: DC | PRN

## 2020-03-11 MED ORDER — MEPERIDINE HCL 50 MG/ML IJ SOLN: 6.2500 mg | INTRAMUSCULAR | Status: DC | PRN

## 2020-03-11 MED ORDER — FENTANYL CITRATE (PF) 100 MCG/2ML IJ SOLN
INTRAMUSCULAR | Status: DC | PRN
Start: 2020-03-11 — End: 2020-03-11
  Administered 2020-03-11 (×2): 25 ug via INTRAVENOUS

## 2020-03-11 MED ORDER — LIDOCAINE 2% (20 MG/ML) 5 ML SYRINGE
INTRAMUSCULAR | Status: AC
  Filled 2020-03-11: qty 5

## 2020-03-11 MED ORDER — IOHEXOL 300 MG/ML  SOLN
INTRAMUSCULAR | Status: DC | PRN
  Administered 2020-03-11: 17 mL

## 2020-03-11 SURGICAL SUPPLY — 15 items
BAG URO CATCHER STRL LF (MISCELLANEOUS) ×3 IMPLANT
CATH INTERMIT  6FR 70CM (CATHETERS) ×3 IMPLANT
CLOTH BEACON ORANGE TIMEOUT ST (SAFETY) ×3 IMPLANT
GLOVE BIOGEL M STRL SZ7.5 (GLOVE) ×3 IMPLANT
GOWN STRL REUS W/TWL LRG LVL3 (GOWN DISPOSABLE) ×6 IMPLANT
GUIDEWIRE STR DUAL SENSOR (WIRE) ×3 IMPLANT
GUIDEWIRE ZIPWRE .038 STRAIGHT (WIRE) IMPLANT
KIT TURNOVER KIT A (KITS) IMPLANT
MANIFOLD NEPTUNE II (INSTRUMENTS) ×3 IMPLANT
PACK CYSTO (CUSTOM PROCEDURE TRAY) ×3 IMPLANT
SHEATH URETERAL 12FRX35CM (MISCELLANEOUS) ×3 IMPLANT
STENT CONTOUR NO GW 8FR 26CM (STENTS) ×3 IMPLANT
TUBING CONNECTING 10 (TUBING) ×2 IMPLANT
TUBING CONNECTING 10' (TUBING) ×1
TUBING UROLOGY SET (TUBING) IMPLANT

## 2020-03-11 NOTE — Transfer of Care (Signed)
Immediate Anesthesia Transfer of Care Note  Patient: [name] [name]  Procedure(s) Performed: CYSTOSCOPY WITH STENT CHANGE, Right Ureteroscopy (Right )  Patient Location: PACU  Anesthesia Type:General  Level of Consciousness: awake, alert , oriented and patient cooperative  Airway & Oxygen Therapy: Patient Spontanous Breathing and Patient connected to face mask oxygen  Post-op Assessment: Report given to RN, Post -op Vital signs reviewed and stable and Patient moving all extremities  Post vital signs: Reviewed and stable  Last Vitals:  Vitals Value Taken Time  BP 125/52 03/11/20 1343  Temp    Pulse 60 03/11/20 1344  Resp 13 03/11/20 1345  SpO2 100 % 03/11/20 1344  Vitals shown include unvalidated device data.  Last Pain:  Vitals:   03/11/20 1136  TempSrc: Oral         Complications: No complications documented.

## 2020-03-11 NOTE — Anesthesia Procedure Notes (Signed)
Procedure Name: LMA Insertion Date/Time: 03/11/2020 12:52 PM Performed by: Mitzie Na, CRNA Pre-anesthesia Checklist: Patient identified, Emergency Drugs available, Suction available and Patient being monitored Patient Re-evaluated:Patient Re-evaluated prior to induction Oxygen Delivery Method: Circle system utilized Preoxygenation: Pre-oxygenation with 100% oxygen Induction Type: IV induction LMA: LMA flexible inserted LMA Size: 4.0 Placement Confirmation: positive ETCO2 and breath sounds checked- equal and bilateral Tube secured with: Tape Dental Injury: Teeth and Oropharynx as per pre-operative assessment

## 2020-03-11 NOTE — Op Note (Signed)
Preoperative diagnosis: 1.  Right ureteral obstruction 2.  Bladder cancer 3.  Right renal pelvic filling defect  Postoperative diagnosis: 1.  Right ureteral obstruction 2.  Bladder cancer 3.  Probable fungal ball  Procedures: 1.  Cystoscopy 2.  Right retrograde pyelography with interpretation 3.  Right ureteroscopy 4.  Right ureteral stent placement (8 x 26-no string)  Surgeon: Pryor Curia MD  Anesthesia: General  Complications: None  EBL: Minimal  Specimens: Culture from right renal pelvis  Disposition of specimen: Microbiology lab  Intraoperative findings: The patient was noted to have some whitish material on the distal tip of his ureteral stent consistent with his known positive preoperative fungal culture.  Right retrograde pyelography did demonstrate some questionable filling defects that appeared to be mobile within the right renal collecting system.  There was mild hydronephrosis.  No ureteral abnormalities were identified.  Indication: [name] [name] is a 79 year old gentleman with a history of bladder cancer status post treatment with definitive radiation therapy.  He also has a history of right ureteral obstruction managed with a chronic indwelling right ureteral stent.  Recently, he underwent imaging per his medical oncologist.  This did demonstrate some filling defects within the right renal collecting system.  He presents today for further evaluation and for cystoscopy and stent change.  We reviewed the potential risks of the above procedures and he and his wife gave informed consent.  He was placed on preoperative antifungal treatment per his preoperative culture.  Description of procedure: The patient was taken the operating room and a general anesthetic was administered.  He was given preoperative antibiotics, placed in the dorsolithotomy position, and prepped and draped in the usual sterile fashion.  Next, a preoperative timeout was performed.  Cystourethroscopy  was then performed and revealed a normal anterior and posterior urethra.  There was evidence of moderate prostatic hypertrophy.  Inspection of the bladder revealed poor visualization due to cloudiness of the urine consistent with funguria.  His ureteral stent was easily identified and was brought out to the urethral meatus using flexible graspers.  A 0.38 sensor guidewire was then advanced through the stent and up into the renal pelvis without difficulty.  I then passed a 6 [place] ureteral catheter over the wire into the mid ureter.  Omnipaque contrast was injected with findings as dictated above.  There was a question of mobile filling defects within the right renal collecting system.  Over the wire, I passed a digital flexible ureteroscope.  However, this was not able to be advanced easily all the way up into the renal collecting system.  I then used a 12/14 ureteral access sheath and was able to place ureteroscope through the access sheath up into the renal collecting system.  Inspection did reveal cloudiness of the urine and what appeared to be 2 small fungus balls.  I was able to use a syringe and irrigate these out of the renal collecting system.  This was sent for culture.  Once no further cloudiness of the urine was noted, the wire was left in place and the ureteroscope was withdrawn.  The wire was then backloaded on a cystoscope and an 8 x 26 double-J ureteral stent was advanced over the wire using Seldinger technique.  This was positioned appropriately under fluoroscopic and cystoscopic guidance.  The wire was then removed with a good curl noted in the renal pelvis as well as within the bladder.  The patient appeared to tolerate the procedure well without complications.  He was  able to be extubated and transferred to the recovery unit in satisfactory condition.

## 2020-03-11 NOTE — Discharge Instructions (Signed)

## 2020-03-11 NOTE — Anesthesia Postprocedure Evaluation (Signed)
Anesthesia Post Note  Patient: Nishawn Rotan  Procedure(s) Performed: CYSTOSCOPY WITH STENT CHANGE, Right Ureteroscopy (Right )     Patient location during evaluation: PACU Anesthesia Type: General Level of consciousness: awake and alert Pain management: pain level controlled Vital Signs Assessment: post-procedure vital signs reviewed and stable Respiratory status: spontaneous breathing, nonlabored ventilation, respiratory function stable and patient connected to nasal cannula oxygen Cardiovascular status: blood pressure returned to baseline and stable Postop Assessment: no apparent nausea or vomiting Anesthetic complications: no   No complications documented.  Last Vitals:  Vitals:   03/11/20 1415 03/11/20 1446  BP: (!) 96/53 (!) 133/45  Pulse: 74 76  Resp: 16 15  Temp: 36.4 C 36.4 C  SpO2: 100% 99%    Last Pain:  Vitals:   03/11/20 1446  TempSrc:   PainSc: 0-No pain                 Abdulloh Ullom

## 2020-03-11 NOTE — Interval H&P Note (Signed)
History and Physical Interval Note:  03/11/2020 11:49 AM  [name] [name]  has presented today for surgery, with the diagnosis of RIGHT URETERAL OBSTRUCTION, BLADDER CANCER.  The various methods of treatment have been discussed with the patient and family. After consideration of risks, benefits and other options for treatment, the patient has consented to  Procedure(s): CYSTOSCOPY WITH STENT CHANGE (Right) as a surgical intervention.  The patient's history has been reviewed, patient examined, no change in status, stable for surgery.  I have reviewed the patient's chart and labs.  Questions were answered to the patient's satisfaction.     [employer]

## 2020-03-11 NOTE — Anesthesia Preprocedure Evaluation (Signed)
Anesthesia Evaluation  Patient identified by MRN, date of birth, ID band Patient awake  General Assessment Comment:Pt had almonds at 0830 will  Go after 1430  Reviewed: Allergy & Precautions, NPO status , Patient's Chart, lab work & pertinent test results, reviewed documented beta blocker date and time   Airway Mallampati: II  TM Distance: >3 FB Neck ROM: Full    Dental  (+) Teeth Intact, Dental Advisory Given   Pulmonary neg pulmonary ROS,    Pulmonary exam normal breath sounds clear to auscultation       Cardiovascular Exercise Tolerance: Good hypertension, Pt. on medications and Pt. on home beta blockers Normal cardiovascular exam Rhythm:Regular Rate:Normal     Neuro/Psych  Neuromuscular disease negative psych ROS   GI/Hepatic negative GI ROS, Neg liver ROS,   Endo/Other  diabetes  Renal/GU Renal disease     Musculoskeletal negative musculoskeletal ROS (+)   Abdominal   Peds negative pediatric ROS (+)  Hematology negative hematology ROS (+)   Anesthesia Other Findings   Reproductive/Obstetrics                             Anesthesia Physical  Anesthesia Plan  ASA: III  Anesthesia Plan: General   Post-op Pain Management:    Induction: Intravenous  PONV Risk Score and Plan: 4 or greater and Treatment may vary due to age or medical condition, Ondansetron and Dexamethasone  Airway Management Planned: LMA  Additional Equipment: None  Intra-op Plan:   Post-operative Plan: Extubation in OR  Informed Consent: I have reviewed the patients History and Physical, chart, labs and discussed the procedure including the risks, benefits and alternatives for the proposed anesthesia with the patient or authorized representative who has indicated his/her understanding and acceptance.     Dental advisory given  Plan Discussed with: CRNA  Anesthesia Plan Comments:          Anesthesia Quick Evaluation

## 2020-03-12 ENCOUNTER — Encounter (HOSPITAL_COMMUNITY): Payer: Self-pay | Admitting: Urology

## 2020-03-12 LAB — URINE CULTURE: Culture: 10000 — AB

## 2020-03-14 DIAGNOSIS — F0390 Unspecified dementia without behavioral disturbance: Secondary | ICD-10-CM | POA: Diagnosis not present

## 2020-03-14 DIAGNOSIS — D649 Anemia, unspecified: Secondary | ICD-10-CM | POA: Diagnosis not present

## 2020-03-14 DIAGNOSIS — Z794 Long term (current) use of insulin: Secondary | ICD-10-CM | POA: Diagnosis not present

## 2020-03-14 DIAGNOSIS — D2320 Other benign neoplasm of skin of unspecified ear and external auricular canal: Secondary | ICD-10-CM | POA: Diagnosis not present

## 2020-03-14 DIAGNOSIS — I10 Essential (primary) hypertension: Secondary | ICD-10-CM | POA: Diagnosis not present

## 2020-03-14 DIAGNOSIS — Z85828 Personal history of other malignant neoplasm of skin: Secondary | ICD-10-CM | POA: Diagnosis not present

## 2020-03-14 DIAGNOSIS — E119 Type 2 diabetes mellitus without complications: Secondary | ICD-10-CM | POA: Diagnosis not present

## 2020-03-14 DIAGNOSIS — Z8551 Personal history of malignant neoplasm of bladder: Secondary | ICD-10-CM | POA: Diagnosis not present

## 2020-03-15 DIAGNOSIS — G309 Alzheimer's disease, unspecified: Secondary | ICD-10-CM | POA: Diagnosis not present

## 2020-03-15 DIAGNOSIS — F0280 Dementia in other diseases classified elsewhere without behavioral disturbance: Secondary | ICD-10-CM | POA: Diagnosis not present

## 2020-03-15 DIAGNOSIS — E1165 Type 2 diabetes mellitus with hyperglycemia: Secondary | ICD-10-CM | POA: Diagnosis not present

## 2020-03-15 DIAGNOSIS — E876 Hypokalemia: Secondary | ICD-10-CM | POA: Diagnosis not present

## 2020-03-15 DIAGNOSIS — N390 Urinary tract infection, site not specified: Secondary | ICD-10-CM | POA: Diagnosis not present

## 2020-03-15 DIAGNOSIS — E860 Dehydration: Secondary | ICD-10-CM | POA: Diagnosis not present

## 2020-03-18 DIAGNOSIS — E119 Type 2 diabetes mellitus without complications: Secondary | ICD-10-CM | POA: Diagnosis not present

## 2020-03-18 DIAGNOSIS — C44229 Squamous cell carcinoma of skin of left ear and external auricular canal: Secondary | ICD-10-CM | POA: Diagnosis not present

## 2020-03-18 DIAGNOSIS — Z8551 Personal history of malignant neoplasm of bladder: Secondary | ICD-10-CM | POA: Diagnosis not present

## 2020-03-18 DIAGNOSIS — I10 Essential (primary) hypertension: Secondary | ICD-10-CM | POA: Diagnosis not present

## 2020-03-18 DIAGNOSIS — F0390 Unspecified dementia without behavioral disturbance: Secondary | ICD-10-CM | POA: Diagnosis not present

## 2020-03-18 DIAGNOSIS — C7989 Secondary malignant neoplasm of other specified sites: Secondary | ICD-10-CM | POA: Diagnosis not present

## 2020-03-18 DIAGNOSIS — D2320 Other benign neoplasm of skin of unspecified ear and external auricular canal: Secondary | ICD-10-CM | POA: Diagnosis not present

## 2020-03-18 DIAGNOSIS — Z85828 Personal history of other malignant neoplasm of skin: Secondary | ICD-10-CM | POA: Diagnosis not present

## 2020-03-22 DIAGNOSIS — E860 Dehydration: Secondary | ICD-10-CM | POA: Diagnosis not present

## 2020-03-22 DIAGNOSIS — E876 Hypokalemia: Secondary | ICD-10-CM | POA: Diagnosis not present

## 2020-03-22 DIAGNOSIS — F0280 Dementia in other diseases classified elsewhere without behavioral disturbance: Secondary | ICD-10-CM | POA: Diagnosis not present

## 2020-03-22 DIAGNOSIS — E1165 Type 2 diabetes mellitus with hyperglycemia: Secondary | ICD-10-CM | POA: Diagnosis not present

## 2020-03-22 DIAGNOSIS — N390 Urinary tract infection, site not specified: Secondary | ICD-10-CM | POA: Diagnosis not present

## 2020-03-22 DIAGNOSIS — G309 Alzheimer's disease, unspecified: Secondary | ICD-10-CM | POA: Diagnosis not present

## 2020-03-26 DIAGNOSIS — F0280 Dementia in other diseases classified elsewhere without behavioral disturbance: Secondary | ICD-10-CM | POA: Diagnosis not present

## 2020-03-26 DIAGNOSIS — N390 Urinary tract infection, site not specified: Secondary | ICD-10-CM | POA: Diagnosis not present

## 2020-03-26 DIAGNOSIS — E876 Hypokalemia: Secondary | ICD-10-CM | POA: Diagnosis not present

## 2020-03-26 DIAGNOSIS — G309 Alzheimer's disease, unspecified: Secondary | ICD-10-CM | POA: Diagnosis not present

## 2020-03-26 DIAGNOSIS — E860 Dehydration: Secondary | ICD-10-CM | POA: Diagnosis not present

## 2020-03-26 DIAGNOSIS — E1165 Type 2 diabetes mellitus with hyperglycemia: Secondary | ICD-10-CM | POA: Diagnosis not present

## 2020-03-28 DIAGNOSIS — Z23 Encounter for immunization: Secondary | ICD-10-CM | POA: Diagnosis not present

## 2020-03-31 DIAGNOSIS — H9190 Unspecified hearing loss, unspecified ear: Secondary | ICD-10-CM | POA: Diagnosis not present

## 2020-03-31 DIAGNOSIS — E876 Hypokalemia: Secondary | ICD-10-CM | POA: Diagnosis not present

## 2020-03-31 DIAGNOSIS — I10 Essential (primary) hypertension: Secondary | ICD-10-CM | POA: Diagnosis not present

## 2020-03-31 DIAGNOSIS — N1330 Unspecified hydronephrosis: Secondary | ICD-10-CM | POA: Diagnosis not present

## 2020-03-31 DIAGNOSIS — Z87442 Personal history of urinary calculi: Secondary | ICD-10-CM | POA: Diagnosis not present

## 2020-03-31 DIAGNOSIS — E860 Dehydration: Secondary | ICD-10-CM | POA: Diagnosis not present

## 2020-03-31 DIAGNOSIS — N390 Urinary tract infection, site not specified: Secondary | ICD-10-CM | POA: Diagnosis not present

## 2020-03-31 DIAGNOSIS — N179 Acute kidney failure, unspecified: Secondary | ICD-10-CM | POA: Diagnosis not present

## 2020-03-31 DIAGNOSIS — E7800 Pure hypercholesterolemia, unspecified: Secondary | ICD-10-CM | POA: Diagnosis not present

## 2020-03-31 DIAGNOSIS — Z85828 Personal history of other malignant neoplasm of skin: Secondary | ICD-10-CM | POA: Diagnosis not present

## 2020-03-31 DIAGNOSIS — T8189XD Other complications of procedures, not elsewhere classified, subsequent encounter: Secondary | ICD-10-CM | POA: Diagnosis not present

## 2020-03-31 DIAGNOSIS — E1165 Type 2 diabetes mellitus with hyperglycemia: Secondary | ICD-10-CM | POA: Diagnosis not present

## 2020-03-31 DIAGNOSIS — N400 Enlarged prostate without lower urinary tract symptoms: Secondary | ICD-10-CM | POA: Diagnosis not present

## 2020-03-31 DIAGNOSIS — T451X5S Adverse effect of antineoplastic and immunosuppressive drugs, sequela: Secondary | ICD-10-CM | POA: Diagnosis not present

## 2020-03-31 DIAGNOSIS — Z960 Presence of urogenital implants: Secondary | ICD-10-CM | POA: Diagnosis not present

## 2020-03-31 DIAGNOSIS — G309 Alzheimer's disease, unspecified: Secondary | ICD-10-CM | POA: Diagnosis not present

## 2020-03-31 DIAGNOSIS — Z794 Long term (current) use of insulin: Secondary | ICD-10-CM | POA: Diagnosis not present

## 2020-03-31 DIAGNOSIS — Z8551 Personal history of malignant neoplasm of bladder: Secondary | ICD-10-CM | POA: Diagnosis not present

## 2020-03-31 DIAGNOSIS — Z9181 History of falling: Secondary | ICD-10-CM | POA: Diagnosis not present

## 2020-03-31 DIAGNOSIS — F0280 Dementia in other diseases classified elsewhere without behavioral disturbance: Secondary | ICD-10-CM | POA: Diagnosis not present

## 2020-04-02 ENCOUNTER — Ambulatory Visit: Payer: Medicare Other | Admitting: Podiatry

## 2020-04-02 DIAGNOSIS — Z08 Encounter for follow-up examination after completed treatment for malignant neoplasm: Secondary | ICD-10-CM | POA: Diagnosis not present

## 2020-04-02 DIAGNOSIS — Z85828 Personal history of other malignant neoplasm of skin: Secondary | ICD-10-CM | POA: Diagnosis not present

## 2020-04-04 DIAGNOSIS — L570 Actinic keratosis: Secondary | ICD-10-CM | POA: Diagnosis not present

## 2020-04-04 DIAGNOSIS — Z85828 Personal history of other malignant neoplasm of skin: Secondary | ICD-10-CM | POA: Diagnosis not present

## 2020-04-05 ENCOUNTER — Telehealth: Payer: Self-pay | Admitting: Radiation Oncology

## 2020-04-05 NOTE — Telephone Encounter (Signed)
I've cancelled Mr. Soderquist 6 month appt on 11/19 @ 10:20a with Dr. Isidore Moos and will be adding a Recon appt for a sooner date once we speak w/patient or a family member. This will be for a new diagnosis of SCC of the scalp. The referral and pathology are scanned into the chart. I have left a voicemail for Dr. Ronnald Ramp' office to please send colored pictures. Sent in basket msg to Dr. Eppie Gibson and nurse Egbert Garibaldi.

## 2020-04-08 ENCOUNTER — Other Ambulatory Visit: Payer: Self-pay

## 2020-04-08 ENCOUNTER — Other Ambulatory Visit: Payer: Medicare Other | Admitting: Hospice

## 2020-04-08 DIAGNOSIS — C4440 Unspecified malignant neoplasm of skin of scalp and neck: Secondary | ICD-10-CM | POA: Diagnosis not present

## 2020-04-08 DIAGNOSIS — Z515 Encounter for palliative care: Secondary | ICD-10-CM | POA: Diagnosis not present

## 2020-04-08 DIAGNOSIS — F0391 Unspecified dementia with behavioral disturbance: Secondary | ICD-10-CM

## 2020-04-08 NOTE — Progress Notes (Signed)
[facility] Consult Note Telephone: [phone]  Fax: [phone]  PATIENT NAME: [name] [name] DOB: 05/25/1941 MRN: [id number]  PRIMARY CARE PROVIDER:   [name], MD [name] [name], [name] [name],  [name] [name] [address]  REFERRING PROVIDER: [name], MD [name] [name], [address] [place],  [place] [address]  RESPONSIBLE PARTY:[name] [name] [phone] c  TELEHEALTH VISIT STATEMENT Due to the COVID-19 crisis, this visit was done via telephone from my office. It was initiated and consented to by this patient and/or family.   RECOMMENDATIONS/PLAN:  Advance Care Planning/Code Status: Visit to build trust and follow-up on palliative care.  Reviewed CODE STATUS.  Patient is a DO NOT RESUSCITATE.  Goals of Care: Goals of care include to maximize quality of life and symptom management.Family considering patient's quality of life;  does not want to pursueChemo orRadiation; family is interested inHospice services in the future.  Follow EY:[id number] care will continue to follow patient for goals of care clarification and symptom management. [name] [name] in person visit 04/15/2020 for evaluation of patient for hospice eligibility.  Symptom management: [name] [name] reported patient has gone for several surgeries on head, left ear  because of spreading skin CA surgery. Chart review from [facility] and Neck surgery indicates invasive squamous cell carcinoma diffusely involving the resections margins. The tumor involves the underlying skeletal muscle and cartilage. Perineural invasion is identified involving multiple nerves. Pain is well managed;  not an issue for patient at this time.  Ongoing memory loss and confusion related to DementiaFAST 6c, FLACC 0. Ongoing weakness and fatigue  related to bladder CA and CA of skin of neck, s/p radiation and chemo.  Patient completed PT. Continue with balance of rest and activity performance.  Chart review shows recent CT neck/thyroid w contrast 01/16/2020 shows someabnormal areas oflinearenhancement in the left floor of mouth suspicious for tumor extension along the hypoglossal area.    Family /Caregiver/Community Supports: Patient lives at home with his wife [name] [name] who is the primary caregiver and very involved in his care.  [name] [name] faith and family support are key factors helping spouse/patient to cope with his health challenges.Strong support system identified.  I spent62minutes providing this consultation; time iincludes time spent with patient/family, chart review,  and documentation. More than 50% of the time in this consultation was spent on coordinating communication  [place] Smithis a 79 y.o.year oldmalewith multiple medical problems including Dementia , HTN, Type2DM, hx of bladder cancer, cancer of skin of neck s/p chemo and radiation.Palliative Care was asked to help address goals of care.   CODE STATUS:DNR  PPS:50%  HOSPICE ELIGIBILITY/DIAGNOSIS: TBD  PAST MEDICAL HISTORY:  Past Medical History:  Diagnosis Date  . Alzheimer disease ([place])   . Alzheimer's disease ([place])   . Bilateral cold feet   . Bladder cancer ([place])    dx in 2013.07/08/19 skin CA in neck  . BPH (benign prostatic hypertrophy)   . Dementia ([place]) 12-11-11   memory short term(steadily progressive worsening-pt. unaware) .dx. Alzheimers  . Diabetes mellitus 12-11-11   oral meds only type II   . 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 Hard of hearing   . Hearing loss    has one hearing aid, doesn't wear  . History of chemotherapy   . History of gallstones 2005  . History of kidney stones   . Hx of nonmelanoma skin cancer   .  Hypercholesterolemia   . Hypertension 12-11-11   controlled with meds  . Increased frequency of urination     wife reports urinary frequency has not improved since last surgery   . Personal history of radiation exposure   . Pulmonary nodules    Bilateral  . Pyelonephritis    hospital admission july 2020  . Renal calculus, left 10/12/2013  . Urinary urgency   . UTI (urinary tract infection) 08-09-13   multiple, last tx. 1 week ago    SOCIAL HX:  Social History   Tobacco Use  . Smoking status: Never Smoker  . Smokeless tobacco: Never Used  Substance Use Topics  . Alcohol use: No    ALLERGIES:  Allergies  Allergen Reactions  . Sulfa Antibiotics Hives  . Tetracyclines & Related Rash  . Aricept [Donepezil Hcl] Nausea And Vomiting     PERTINENT MEDICATIONS:  Outpatient Encounter Medications as of 04/08/2020  Medication Sig  . dutasteride (AVODART) 0.5 MG capsule Take 0.5 mg by mouth daily.   . hydrochlorothiazide (MICROZIDE) 12.5 MG capsule Take 12.5 mg by mouth daily.  . memantine (NAMENDA XR) 28 MG CP24 24 hr capsule Take 28 mg by mouth daily.   . metFORMIN (GLUCOPHAGE-XR) 750 MG 24 hr tablet Take 750 mg by mouth every evening.   . metoprolol succinate (TOPROL-XL) 25 MG 24 hr tablet Take 25 mg by mouth daily.   . Multiple Vitamin (MULTIVITAMIN WITH MINERALS) TABS tablet Take 1 tablet by mouth every evening.  . Omega-3 Fatty Acids (FISH OIL) 1200 MG CAPS Take 1,200 mg by mouth every evening.  [name] DELICA LANCETS 58N MISC 2 (two) times daily. as directed  . perindopril (ACEON) 4 MG tablet Take 4 mg by mouth daily.  . sitaGLIPtin (JANUVIA) 100 MG tablet Take 100 mg by mouth daily.  . vitamin B-12 (CYANOCOBALAMIN) 1000 MCG tablet Take 1,000 mcg by mouth daily.   No facility-administered encounter medications on file as of 04/08/2020.     Teodoro Spray, NP

## 2020-04-15 ENCOUNTER — Other Ambulatory Visit: Payer: Medicare Other | Admitting: Hospice

## 2020-04-15 ENCOUNTER — Other Ambulatory Visit: Payer: Self-pay

## 2020-04-15 DIAGNOSIS — Z515 Encounter for palliative care: Secondary | ICD-10-CM

## 2020-04-15 DIAGNOSIS — C4440 Unspecified malignant neoplasm of skin of scalp and neck: Secondary | ICD-10-CM

## 2020-04-15 NOTE — Progress Notes (Signed)
[facility] Consult Note Telephone: [phone]  Fax: [phone]  PATIENT NAME: [name] [name] DOB: 1941-05-06 MRN: [id number]  PRIMARY CARE PROVIDER:   [name], MD [name] Seashore, MD [address] [place],  [place] [address]  REFERRING PROVIDER: [name], MD [name] Seashore, MD [place],  [name] [address]  RESPONSIBLE PARTY:[name] [name] [phone]   RECOMMENDATIONS/PLAN:  Advance Care Planning/Code Status: Visit to build trust and follow-up on palliative care.  Visit consisted of building trust and discussions on Palliative Medicine as specialized medical care for people living with serious illness, aimed at facilitating better quality of life through symptoms relief, assisting with advance care plan and establishing goals of care.   CODE STATUS: Reviewed CODE STATUS. Patient is a DO NOT RESUSCITATE.  Goals of Care: Extensive discussion on goals of care. Family continuing to consider patient's quality of life vis--vis aggressive treatment like radiation/chemotherapy. Plan is to see Oncologists this month and decide whether to embark on radiation or not. Goals of care include to maximize quality of life and symptom management. Family isinterested inHospice services in the future.  Therapeutic listening and ample emotional support provided. Follow WC:[id number] care will continue to follow patient for goals of care clarification and symptom management.  Follow-up in a month.   Symptom management: Pain: patient denies pain; pain is not an issue for patient at this time. FLACC 0. Cancer of skin and neck continues to spread/erupt n top of head, left ear. [name] [name] reported patient has gone for several surgeries on head, left ear  because of spreading skin CA surgery Chart review from [facility] and Neck surgery indicates invasive squamous cell  carcinoma diffusely involving the resections margins.   Ongoing memory loss and confusion related to DementiaFAST 6c, FLACC 0.  Fatigue: PT is ongoing. Continue activities/ambulation as tolerated; discussion on balanace of rest and performance.   Family /Caregiver/Community Supports:Patient lives at home with his wife [name] [name] who is the primary caregiver and very involved in his care. [name] faith and family support are key factors helping spouse/patient to cope with his health challenges.Strong support system identified.  I spentone hour and 23minutes providing this consultation; time iincludes time spent with patient/family, chart review, and documentation. More than 50% of the time in this consultation was spent on coordinating communication  [name] [place] [name] a 79 y.o.year oldmalewith multiple medical problems including Dementia , HTN, Type2DM, hx of bladder cancer, cancer of skin of neck s/p chemo and radiation.Palliative Care was asked to help address goals of care.   CODE STATUS:DNR  PPS:50% HOSPICE ELIGIBILITY/DIAGNOSIS: TBD  PAST MEDICAL HISTORY:  Past Medical History:  Diagnosis Date  . Alzheimer disease ([facility])   . Alzheimer's disease ([facility])   . Bilateral cold feet   . Bladder cancer ([place])    dx in 2013.07/08/19 skin CA in neck  . BPH (benign prostatic hypertrophy)   . Dementia ([facility]) 12-11-11   memory short term(steadily progressive worsening-pt. unaware) .dx. Alzheimers  . Diabetes mellitus 12-11-11   oral meds only type II   . 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 Hard of hearing   . Hearing loss    has one hearing aid, doesn't wear  . History of chemotherapy   . History of gallstones 2005  . History of kidney stones   . Hx of nonmelanoma skin cancer   . Hypercholesterolemia   .  Hypertension 12-11-11   controlled with meds  . Increased frequency of urination    wife reports urinary  frequency has not improved since last surgery   . Personal history of radiation exposure   . Pulmonary nodules    Bilateral  . Pyelonephritis    hospital admission july 2020  . Renal calculus, left 10/12/2013  . Urinary urgency   . UTI (urinary tract infection) 08-09-13   multiple, last tx. 1 week ago    SOCIAL HX:  Social History   Tobacco Use  . Smoking status: Never Smoker  . Smokeless tobacco: Never Used  Substance Use Topics  . Alcohol use: No    ALLERGIES:  Allergies  Allergen Reactions  . Sulfa Antibiotics Hives  . Tetracyclines & Related Rash  . Aricept [Donepezil Hcl] Nausea And Vomiting     PERTINENT MEDICATIONS:  Outpatient Encounter Medications as of 04/15/2020  Medication Sig  . dutasteride (AVODART) 0.5 MG capsule Take 0.5 mg by mouth daily.   . hydrochlorothiazide (MICROZIDE) 12.5 MG capsule Take 12.5 mg by mouth daily.  . memantine (NAMENDA XR) 28 MG CP24 24 hr capsule Take 28 mg by mouth daily.   . metFORMIN (GLUCOPHAGE-XR) 750 MG 24 hr tablet Take 750 mg by mouth every evening.   . metoprolol succinate (TOPROL-XL) 25 MG 24 hr tablet Take 25 mg by mouth daily.   . Multiple Vitamin (MULTIVITAMIN WITH MINERALS) TABS tablet Take 1 tablet by mouth every evening.  . Omega-3 Fatty Acids (FISH OIL) 1200 MG CAPS Take 1,200 mg by mouth every evening.  Glory [name] DELICA LANCETS 46E MISC 2 (two) times daily. as directed  . perindopril (ACEON) 4 MG tablet Take 4 mg by mouth daily.  . sitaGLIPtin (JANUVIA) 100 MG tablet Take 100 mg by mouth daily.  . vitamin B-12 (CYANOCOBALAMIN) 1000 MCG tablet Take 1,000 mcg by mouth daily.   No facility-administered encounter medications on file as of 04/15/2020.    PHYSICAL EXAM/ROS: Height 5 feet 10 Weight 153 Ibs  Down from 200 Ibs  A year ago.  General: NAD, cooperative Cardiovascular: regular rate and rhythm; denies chest pain Pulmonary: clear ant/post fields; no coughing, no shortness of breath.  Normal respiratory efforts  on room air Abdomen: soft, nontender, + bowel sounds GU: no suprapubic tenderness Extremities: no edema, no joint deformities Skin: no rashes to exposed skin Neurological: Weakness but otherwise nonfocal  [name] Spray, NP

## 2020-04-23 ENCOUNTER — Telehealth: Payer: Self-pay | Admitting: Radiation Oncology

## 2020-04-23 DIAGNOSIS — Z9889 Other specified postprocedural states: Secondary | ICD-10-CM | POA: Diagnosis not present

## 2020-04-23 DIAGNOSIS — C4442 Squamous cell carcinoma of skin of scalp and neck: Secondary | ICD-10-CM | POA: Insufficient documentation

## 2020-04-23 DIAGNOSIS — Z923 Personal history of irradiation: Secondary | ICD-10-CM | POA: Diagnosis not present

## 2020-04-23 DIAGNOSIS — Z08 Encounter for follow-up examination after completed treatment for malignant neoplasm: Secondary | ICD-10-CM | POA: Diagnosis not present

## 2020-04-23 DIAGNOSIS — Z85828 Personal history of other malignant neoplasm of skin: Secondary | ICD-10-CM | POA: Diagnosis not present

## 2020-04-23 NOTE — Telephone Encounter (Signed)
[name], wife of patient, called to confirm that we have no f/u appt for Dr. Isidore Moos. I read to her the note I made on 10/22 in cancelling the last f/u appt w/Dr. Isidore Moos: I've cancelled Mr. Noreen 6 month appt on 11/19 @ 10:20a with Dr. Isidore Moos and will be adding a Recon appt for a sooner date once we speak w/patient or a family member. This will be for a new diagnosis of SCC of the scalp. The referral and pathology are scanned into the chart. I have left a voicemail for Dr. Ronnald Ramp' office to please send colored pictures. Sent in basket msg to Dr. Eppie Gibson and nurse Egbert Garibaldi.   [name] states that she would like to keep it this way with no f/u with Dr. Isidore Moos. They are not going to pursue any further radiation.  I will let Dr. Isidore Moos and nurse, Egbert Garibaldi know.

## 2020-04-30 DIAGNOSIS — F0390 Unspecified dementia without behavioral disturbance: Secondary | ICD-10-CM | POA: Diagnosis not present

## 2020-04-30 DIAGNOSIS — Z85828 Personal history of other malignant neoplasm of skin: Secondary | ICD-10-CM | POA: Diagnosis not present

## 2020-04-30 DIAGNOSIS — L988 Other specified disorders of the skin and subcutaneous tissue: Secondary | ICD-10-CM | POA: Diagnosis not present

## 2020-04-30 DIAGNOSIS — I10 Essential (primary) hypertension: Secondary | ICD-10-CM | POA: Diagnosis not present

## 2020-04-30 DIAGNOSIS — Z923 Personal history of irradiation: Secondary | ICD-10-CM | POA: Diagnosis not present

## 2020-04-30 DIAGNOSIS — Z7984 Long term (current) use of oral hypoglycemic drugs: Secondary | ICD-10-CM | POA: Diagnosis not present

## 2020-04-30 DIAGNOSIS — E119 Type 2 diabetes mellitus without complications: Secondary | ICD-10-CM | POA: Diagnosis not present

## 2020-04-30 DIAGNOSIS — Z8551 Personal history of malignant neoplasm of bladder: Secondary | ICD-10-CM | POA: Diagnosis not present

## 2020-05-02 DIAGNOSIS — C4442 Squamous cell carcinoma of skin of scalp and neck: Secondary | ICD-10-CM | POA: Diagnosis not present

## 2020-05-02 DIAGNOSIS — Z85828 Personal history of other malignant neoplasm of skin: Secondary | ICD-10-CM | POA: Diagnosis not present

## 2020-05-02 DIAGNOSIS — D224 Melanocytic nevi of scalp and neck: Secondary | ICD-10-CM | POA: Diagnosis not present

## 2020-05-02 DIAGNOSIS — F0390 Unspecified dementia without behavioral disturbance: Secondary | ICD-10-CM | POA: Diagnosis not present

## 2020-05-02 DIAGNOSIS — Z8551 Personal history of malignant neoplasm of bladder: Secondary | ICD-10-CM | POA: Diagnosis not present

## 2020-05-02 DIAGNOSIS — E119 Type 2 diabetes mellitus without complications: Secondary | ICD-10-CM | POA: Diagnosis not present

## 2020-05-02 DIAGNOSIS — I10 Essential (primary) hypertension: Secondary | ICD-10-CM | POA: Diagnosis not present

## 2020-05-02 DIAGNOSIS — D044 Carcinoma in situ of skin of scalp and neck: Secondary | ICD-10-CM | POA: Diagnosis not present

## 2020-05-02 DIAGNOSIS — L988 Other specified disorders of the skin and subcutaneous tissue: Secondary | ICD-10-CM | POA: Diagnosis not present

## 2020-05-03 ENCOUNTER — Ambulatory Visit: Payer: Self-pay | Admitting: Radiation Oncology

## 2020-05-13 DIAGNOSIS — Z08 Encounter for follow-up examination after completed treatment for malignant neoplasm: Secondary | ICD-10-CM | POA: Diagnosis not present

## 2020-05-13 DIAGNOSIS — Z85828 Personal history of other malignant neoplasm of skin: Secondary | ICD-10-CM | POA: Diagnosis not present

## 2020-05-21 DIAGNOSIS — R8271 Bacteriuria: Secondary | ICD-10-CM | POA: Diagnosis not present

## 2020-05-21 DIAGNOSIS — Z8551 Personal history of malignant neoplasm of bladder: Secondary | ICD-10-CM | POA: Diagnosis not present

## 2020-05-21 DIAGNOSIS — N131 Hydronephrosis with ureteral stricture, not elsewhere classified: Secondary | ICD-10-CM | POA: Diagnosis not present

## 2020-05-22 DIAGNOSIS — E113291 Type 2 diabetes mellitus with mild nonproliferative diabetic retinopathy without macular edema, right eye: Secondary | ICD-10-CM | POA: Diagnosis not present

## 2020-05-22 DIAGNOSIS — H35351 Cystoid macular degeneration, right eye: Secondary | ICD-10-CM | POA: Diagnosis not present

## 2020-05-22 DIAGNOSIS — H524 Presbyopia: Secondary | ICD-10-CM | POA: Diagnosis not present

## 2020-05-22 DIAGNOSIS — H40013 Open angle with borderline findings, low risk, bilateral: Secondary | ICD-10-CM | POA: Diagnosis not present

## 2020-06-04 ENCOUNTER — Other Ambulatory Visit: Payer: Self-pay | Admitting: Urology

## 2020-06-13 ENCOUNTER — Other Ambulatory Visit: Payer: Self-pay

## 2020-06-13 ENCOUNTER — Other Ambulatory Visit: Payer: Medicare Other | Admitting: Hospice

## 2020-06-13 DIAGNOSIS — C4440 Unspecified malignant neoplasm of skin of scalp and neck: Secondary | ICD-10-CM

## 2020-06-13 DIAGNOSIS — Z515 Encounter for palliative care: Secondary | ICD-10-CM

## 2020-06-13 NOTE — Progress Notes (Signed)
[facility] Consult Note Telephone: [phone]  Fax: [phone]  PATIENT NAME: [name] [name] DOB: 1940/08/31 MRN: [id number]  PRIMARY CARE PROVIDER:   [name], MD [name] Seashore, MD [address] [place],  [place] [address]  REFERRING PROVIDER: [name], MD [name] Seashore, MD [place],  [place] [address]  RESPONSIBLE PARTY:[name] [name] [phone] c   RECOMMENDATIONS/PLAN: Visit to build trust and follow-up on palliative care. Patient's spouse, visiting son and son's wife and children were present during visit. Emotional support and therapeutic listening provided as family shared their holiday experiences. Palliative care will continue to provide support to patient, family and the medical team.  Advance Care Planning/Code Status: Reviewed CODE STATUS. Patient is a DO NOT RESUSCITATE.  Goals of Care: Extensive discussion on goals of care. [name] [name] reported family decision on No more radiation/chemotherapy; may do surgical excision of tumor where it is not a complicated procedure,  and replacement of stent in the bladder.  Goals of care include to maximize quality of life and symptom management. Family isinterested inHospice services in the future.  Therapeutic listening, validation and ample emotional support provided. Follow [id number] care will continue to follow patient for goals of care clarification and symptom management.  Follow-up in 2 months; gave [name] [name] MOST form for discussion next visit.   Symptom management: Pain: patient denies pain/discomfort. FLACC 0. Cancer of skin and neck continues to spread/erupt n top of head, left ear.  Follow up appointment with Oncologist 06/18/20; appointment with PCP in a month. Ongoing memory loss and confusionrelated toDementiaFAST 6c, FLACC 0.  Fall: Patient is a high fall risk. Fall precautions  discussed including Education on need to use rolling walker as a support; slow position changes; discussion on balance of rest and performance.  Family /Caregiver/Community Supports:Patient lives at home with his wife [name] [name] who is the primary caregiver and very involved in his care. [name] [name] faith and family support are key factors helping spouse/patient to cope with his health challenges.Son and his wife and children visiting. Strong support system identified.  I spent36minutes providing this consultation; time iincludes time spent with patient/family, chart review, and documentation. More than 50% of the time in this consultation was spent on coordinating communication  [name] Smithis a 79 y.o.year oldmalewith multiple medical problems including Dementia , HTN, Type2DM, hx of bladder cancer, cancer of skin of neck s/p chemo and radiation.Palliative Care was asked to help address goals of care.   CODE STATUS:DNR  PPS:50% HOSPICE ELIGIBILITY/DIAGNOSIS: TBD  PAST MEDICAL HISTORY:  Past Medical History:  Diagnosis Date  . Alzheimer disease ([facility])   . Alzheimer's disease ([facility])   . Bilateral cold feet   . Bladder cancer ([facility])    dx in 2013.07/08/19 skin CA in neck  . BPH (benign prostatic hypertrophy)   . Dementia ([facility]) 12-11-11   memory short term(steadily progressive worsening-pt. unaware) .dx. Alzheimers  . Diabetes mellitus 12-11-11   oral meds only type II   . 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 Hard of hearing   . Hearing loss    has one hearing aid, doesn't wear  . History of chemotherapy   . History of gallstones 2005  . History of kidney stones   . Hx of nonmelanoma skin cancer   . Hypercholesterolemia   . Hypertension 12-11-11   controlled with meds  . Increased frequency of urination  wife reports urinary frequency has not improved since last surgery   . Personal history of  radiation exposure   . Pulmonary nodules    Bilateral  . Pyelonephritis    hospital admission july 2020  . Renal calculus, left 10/12/2013  . Urinary urgency   . UTI (urinary tract infection) 08-09-13   multiple, last tx. 1 week ago    SOCIAL HX:  Social History   Tobacco Use  . Smoking status: Never Smoker  . Smokeless tobacco: Never Used  Substance Use Topics  . Alcohol use: No    ALLERGIES:  Allergies  Allergen Reactions  . Sulfa Antibiotics Hives  . Tetracyclines & Related Rash  . Aricept [Donepezil Hcl] Nausea And Vomiting     PERTINENT MEDICATIONS:  Outpatient Encounter Medications as of 06/13/2020  Medication Sig  . dutasteride (AVODART) 0.5 MG capsule Take 0.5 mg by mouth daily.   . hydrochlorothiazide (MICROZIDE) 12.5 MG capsule Take 12.5 mg by mouth daily.  . memantine (NAMENDA XR) 28 MG CP24 24 hr capsule Take 28 mg by mouth daily.   . metFORMIN (GLUCOPHAGE-XR) 750 MG 24 hr tablet Take 750 mg by mouth every evening.   . metoprolol succinate (TOPROL-XL) 25 MG 24 hr tablet Take 25 mg by mouth daily.   . Multiple Vitamin (MULTIVITAMIN WITH MINERALS) TABS tablet Take 1 tablet by mouth every evening.  . Omega-3 Fatty Acids (FISH OIL) 1200 MG CAPS Take 1,200 mg by mouth every evening.  Letta Pate DELICA LANCETS 33G MISC 2 (two) times daily. as directed  . perindopril (ACEON) 4 MG tablet Take 4 mg by mouth daily.  . sitaGLIPtin (JANUVIA) 100 MG tablet Take 100 mg by mouth daily.  . vitamin B-12 (CYANOCOBALAMIN) 1000 MCG tablet Take 1,000 mcg by mouth daily.   No facility-administered encounter medications on file as of 06/13/2020.    PHYSICAL EXAM/ROS: Height 5 feet 10 Weight 146 Ibs  Down from 200 Ibs about a year ago.  General: NAD, cooperative Cardiovascular: regular rate and rhythm; denies chest pain Pulmonary: clear ant/post fields; no coughing, no shortness of breath.  Normal respiratory efforts on room air Abdomen: soft, nontender, + bowel sounds GU: no  suprapubic tenderness Extremities: no edema, no joint deformities Neurological: Weakness but otherwise nonfocal  Note:  Portions of this note were generated with Dragon dictation software. Dictation errors may occur despite attempts at proofreading.  [name] Carpenter, NP

## 2020-06-18 ENCOUNTER — Ambulatory Visit: Payer: Medicare Other | Admitting: Podiatry

## 2020-06-18 DIAGNOSIS — C4442 Squamous cell carcinoma of skin of scalp and neck: Secondary | ICD-10-CM | POA: Diagnosis not present

## 2020-06-18 DIAGNOSIS — Z85828 Personal history of other malignant neoplasm of skin: Secondary | ICD-10-CM | POA: Diagnosis not present

## 2020-06-21 ENCOUNTER — Ambulatory Visit: Payer: Federal, State, Local not specified - PPO | Admitting: Podiatry

## 2020-06-27 DIAGNOSIS — G309 Alzheimer's disease, unspecified: Secondary | ICD-10-CM | POA: Diagnosis not present

## 2020-06-27 DIAGNOSIS — E118 Type 2 diabetes mellitus with unspecified complications: Secondary | ICD-10-CM | POA: Diagnosis not present

## 2020-06-27 DIAGNOSIS — N390 Urinary tract infection, site not specified: Secondary | ICD-10-CM | POA: Diagnosis not present

## 2020-06-27 DIAGNOSIS — Z79899 Other long term (current) drug therapy: Secondary | ICD-10-CM | POA: Diagnosis not present

## 2020-06-27 DIAGNOSIS — E1165 Type 2 diabetes mellitus with hyperglycemia: Secondary | ICD-10-CM | POA: Diagnosis not present

## 2020-06-27 DIAGNOSIS — D509 Iron deficiency anemia, unspecified: Secondary | ICD-10-CM | POA: Diagnosis not present

## 2020-06-27 DIAGNOSIS — I10 Essential (primary) hypertension: Secondary | ICD-10-CM | POA: Diagnosis not present

## 2020-07-03 NOTE — Patient Instructions (Addendum)
DUE TO COVID-19 ONLY ONE VISITOR IS ALLOWED TO COME WITH YOU AND STAY IN THE WAITING ROOM ONLY DURING PRE OP AND PROCEDURE DAY OF SURGERY. THE 1 VISITOR  MAY VISIT WITH YOU AFTER SURGERY IN YOUR PRIVATE ROOM DURING VISITING HOURS ONLY!  YOU NEED TO HAVE A COVID 19 TEST ON: 07/08/20 @ 11:00 AM , THIS TEST MUST BE DONE BEFORE SURGERY,  COVID TESTING SITE [place] [address], IT IS ON THE RIGHT GOING OUT [address] APPROXIMATELY  2 MINUTES PAST [employer] ON THE RIGHT. ONCE YOUR COVID TEST IS COMPLETED,  PLEASE BEGIN THE QUARANTINE INSTRUCTIONS AS OUTLINED IN YOUR HANDOUT.                Ernesto Rutherford   Your procedure is scheduled on: 07/11/20   Report to [facility] Main  Entrance   Report to admitting at: 10:00 AM     Call this number if you have problems the morning of surgery [phone]    Remember: Do not eat solid food :After Midnight. Clear liquid until: 9:00 am.  CLEAR LIQUID DIET  Foods Allowed                                                                     Foods Excluded  Coffee and tea, regular and decaf                             liquids that you cannot  Plain Jell-O any favor except red or purple                                           see through such as: Fruit ices (not with fruit pulp)                                     milk, soups, orange juice  Iced Popsicles                                    All solid food Carbonated beverages, regular and diet                                    Cranberry, grape and apple juices Sports drinks like Gatorade Lightly seasoned clear broth or consume(fat free) Sugar, honey syrup  Sample Menu Breakfast                                Lunch                                     Supper Cranberry juice  Beef broth                            Chicken broth Jell-O                                     Grape juice                           Apple juice Coffee or tea                         Jell-O                                      Popsicle                                                Coffee or tea                        Coffee or tea  _____________________________________________________________________  BRUSH YOUR TEETH MORNING OF SURGERY AND RINSE YOUR MOUTH OUT, NO CHEWING GUM CANDY OR MINTS.    Take these medicines the morning of surgery with A SIP OF WATER: metoprolol,dutasteride,memantine.     How to Manage Your Diabetes Before and After Surgery  Why is it important to control my blood sugar before and after surgery? . Improving blood sugar levels before and after surgery helps healing and can limit problems. . A way of improving blood sugar control is eating a healthy diet by: o  Eating less sugar and carbohydrates o  Increasing activity/exercise o  Talking with your doctor about reaching your blood sugar goals . High blood sugars (greater than 180 mg/dL) can raise your risk of infections and slow your recovery, so you will need to focus on controlling your diabetes during the weeks before surgery. . Make sure that the doctor who takes care of your diabetes knows about your planned surgery including the date and location.  How do I manage my blood sugar before surgery? . Check your blood sugar at least 4 times a day, starting 2 days before surgery, to make sure that the level is not too high or low. o Check your blood sugar the morning of your surgery when you wake up and every 2 hours until you get to the Short Stay unit. . If your blood sugar is less than 70 mg/dL, you will need to treat for low blood sugar: o Do not take insulin. o Treat a low blood sugar (less than 70 mg/dL) with  cup of clear juice (cranberry or apple), 4 glucose tablets, OR glucose gel. o Recheck blood sugar in 15 minutes after treatment (to make sure it is greater than 70 mg/dL). If your blood sugar is not greater than 70 mg/dL on recheck, call [phone] for further  instructions. . Report your blood sugar to the short stay nurse when you get to Short Stay.  . If you are admitted to the hospital after surgery: o Your blood sugar will be checked by the staff and you will probably  be given insulin after surgery (instead of oral diabetes medicines) to make sure you have good blood sugar levels. o The goal for blood sugar control after surgery is 80-180 mg/dL.   WHAT DO I DO ABOUT MY DIABETES MEDICATION?  [name] Do not take oral diabetes medicines (pills) the morning of surgery.  . THE DAY BEFORE SURGERY, take Metformin and Januvia as usual.      . THE MORNING OF SURGERY, DO NOT take any diabetes medicine.                           You may not have any metal on your body including hair pins and              piercings  Do not wear jewelry, lotions, powders or perfumes, deodorant             Men may shave face and neck.   Do not bring valuables to the hospital. [place].  Contacts, dentures or bridgework may not be worn into surgery.  Leave suitcase in the car. After surgery it may be brought to your room.     Patients discharged the day of surgery will not be allowed to drive home. IF YOU ARE HAVING SURGERY AND GOING HOME THE SAME DAY, YOU MUST HAVE AN ADULT TO DRIVE YOU HOME AND BE WITH YOU FOR 24 HOURS. YOU MAY GO HOME BY TAXI OR UBER OR ORTHERWISE, BUT AN ADULT MUST ACCOMPANY YOU HOME AND STAY WITH YOU FOR 24 HOURS.  Name and phone number of your driver:  Special Instructions: N/A              Please read over the following fact sheets you were given: _____________________________________________________________________          [facility] - Preparing for Surgery Before surgery, you can play an important role.  Because skin is not sterile, your skin needs to be as free of germs as possible.  You can reduce the number of germs on your skin by washing with CHG (chlorahexidine gluconate) soap before  surgery.  CHG is an antiseptic cleaner which kills germs and bonds with the skin to continue killing germs even after washing. Please DO NOT use if you have an allergy to CHG or antibacterial soaps.  If your skin becomes reddened/irritated stop using the CHG and inform your nurse when you arrive at Short Stay. Do not shave (including legs and underarms) for at least 48 hours prior to the first CHG shower.  You may shave your face/neck. Please follow these instructions carefully:  1.  Shower with CHG Soap the night before surgery and the  morning of Surgery.  2.  If you choose to wash your hair, wash your hair first as usual with your  normal  shampoo.  3.  After you shampoo, rinse your hair and body thoroughly to remove the  shampoo.                           4.  Use CHG as you would any other liquid soap.  You can apply chg directly  to the skin and wash                       Gently with a scrungie or  clean washcloth.  5.  Apply the CHG Soap to your body ONLY FROM THE NECK DOWN.   Do not use on face/ open                           Wound or open sores. Avoid contact with eyes, ears mouth and genitals (private parts).                       Wash face,  Genitals (private parts) with your normal soap.             6.  Wash thoroughly, paying special attention to the area where your surgery  will be performed.  7.  Thoroughly rinse your body with warm water from the neck down.  8.  DO NOT shower/wash with your normal soap after using and rinsing off  the CHG Soap.                9.  Pat yourself dry with a clean towel.            10.  Wear clean pajamas.            11.  Place clean sheets on your bed the night of your first shower and do not  sleep with pets. Day of Surgery : Do not apply any lotions/deodorants the morning of surgery.  Please wear clean clothes to the hospital/surgery center.  FAILURE TO FOLLOW THESE INSTRUCTIONS MAY RESULT IN THE CANCELLATION OF YOUR SURGERY PATIENT  SIGNATURE_________________________________  NURSE SIGNATURE__________________________________  ________________________________________________________________________

## 2020-07-04 DIAGNOSIS — Z0000 Encounter for general adult medical examination without abnormal findings: Secondary | ICD-10-CM | POA: Diagnosis not present

## 2020-07-04 DIAGNOSIS — F0280 Dementia in other diseases classified elsewhere without behavioral disturbance: Secondary | ICD-10-CM | POA: Diagnosis not present

## 2020-07-04 DIAGNOSIS — I10 Essential (primary) hypertension: Secondary | ICD-10-CM | POA: Diagnosis not present

## 2020-07-04 DIAGNOSIS — E1165 Type 2 diabetes mellitus with hyperglycemia: Secondary | ICD-10-CM | POA: Diagnosis not present

## 2020-07-04 DIAGNOSIS — E782 Mixed hyperlipidemia: Secondary | ICD-10-CM | POA: Diagnosis not present

## 2020-07-04 DIAGNOSIS — G309 Alzheimer's disease, unspecified: Secondary | ICD-10-CM | POA: Diagnosis not present

## 2020-07-05 ENCOUNTER — Encounter (HOSPITAL_COMMUNITY): Payer: Self-pay

## 2020-07-05 ENCOUNTER — Other Ambulatory Visit: Payer: Self-pay

## 2020-07-05 ENCOUNTER — Encounter (HOSPITAL_COMMUNITY)
Admission: RE | Admit: 2020-07-05 | Discharge: 2020-07-05 | Disposition: A | Discharge status: Home / Self Care | Payer: Medicare Other | Source: Ambulatory Visit | Attending: Urology | Admitting: Urology

## 2020-07-05 DIAGNOSIS — Z01818 Encounter for other preprocedural examination: Secondary | ICD-10-CM | POA: Diagnosis not present

## 2020-07-05 HISTORY — DX: Anemia, unspecified: D64.9

## 2020-07-05 NOTE — Progress Notes (Signed)
COVID Vaccine Completed: Yes Date COVID Vaccine completed:07/26/19 COVID vaccine manufacturer: [employer]      PCP - Dr. [name] Cardiologist - NO  Chest x-ray - 01/30/20 EKG -  Stress Test -  ECHO -  Cardiac Cath -  Pacemaker/ICD device last checked:  Sleep Study -  CPAP -   Fasting Blood Sugar - N/A Checks Blood Sugar __0___ times a day  Blood Thinner Instructions: Aspirin Instructions: Last Dose:  Anesthesia review: Hx: HTN,DIA,Alzheimer.   Patient denies shortness of breath, fever, cough and chest pain at PAT appointment   Patient verbalized understanding of instructions that were given to them at the PAT appointment. Patient was also instructed that they will need to review over the PAT instructions again at home before surgery.

## 2020-07-08 ENCOUNTER — Encounter (HOSPITAL_COMMUNITY)
Admission: RE | Admit: 2020-07-08 | Discharge: 2020-07-08 | Disposition: A | Discharge status: Home / Self Care | Payer: Medicare Other | Source: Ambulatory Visit | Attending: Urology | Admitting: Urology

## 2020-07-08 ENCOUNTER — Other Ambulatory Visit: Payer: Self-pay

## 2020-07-08 ENCOUNTER — Other Ambulatory Visit (HOSPITAL_COMMUNITY)
Admission: RE | Admit: 2020-07-08 | Discharge: 2020-07-08 | Disposition: A | Discharge status: Home / Self Care | Payer: Medicare Other | Source: Ambulatory Visit | Attending: Urology | Admitting: Urology

## 2020-07-08 DIAGNOSIS — Z01812 Encounter for preprocedural laboratory examination: Secondary | ICD-10-CM | POA: Insufficient documentation

## 2020-07-08 DIAGNOSIS — Z20822 Contact with and (suspected) exposure to covid-19: Secondary | ICD-10-CM | POA: Diagnosis not present

## 2020-07-08 LAB — GLUCOSE, CAPILLARY: Glucose-Capillary: 206 mg/dL — ABNORMAL HIGH (ref 70–99)

## 2020-07-08 LAB — SARS CORONAVIRUS 2 (TAT 6-24 HRS): SARS Coronavirus 2: NEGATIVE

## 2020-07-10 NOTE — H&P (Signed)
1. Bladder cancer  2. Right ureteral obstruction  3. Chronic bacteriuria   Mr. Kuligowski returns today for further evaluation of his right ureteral obstruction secondary to bladder cancer. Currently, he is scheduled follow-up with Dr. Alen Blew in February. However, he is now scheduled to begin palliative care therapy and possibly will and to hospice treatment at the beginning of next year. He has had continued cognitive decline. He has not been hospitalized since his last stent change in late September. He denies any recent hematuria. He has undergone removal of 2 superficial skin cancers on his scalp recently.     ALLERGIES: Aricept Nitrofurantoin Monohyd Macro CAPS Sulfa Drugs Tetracyclines    MEDICATIONS: Metoprolol Succinate 25 mg tablet, extended release 24 hr  Avodart 0.5 mg capsule 0 Oral  Cefdinir  Fish Oil CAPS Oral  Glipizide  Hydrochlorothiazide 25 mg tablet Oral  Januvia 100 mg tablet  Metformin Hcl Er 750 mg tablet, extended release 24 hr  Namenda Xr 28 mg capsule sprinkle, extended release 24 hr Oral  Perindopril Erbumine 4 MG Oral Tablet Oral  Vitamin B-12 100 mcg tablet Oral     GU PSH: Bladder Instill AntiCA Agent - 2016 Catheterize For Residual - 2019 Cysto Bladder Ureth Biopsy - 2018 Cysto Uretero Lithotripsy - 2010 Cystoscopy - 2020, 2019, 2019, 2018, 2018, 2018 Cystoscopy Fulguration - 2010 Cystoscopy Insert Stent, Right - 03/11/2020, Right - 10/30/2019, Right - 08/07/2019, Right - 05/08/2019, Right - 01/26/2019, Right - 2020, Right - 2020, 2015, 2010, 2010, 2008 Cystoscopy TURBT <2 cm - 2020, 2019, 2018, 2017, 2015, 2013 Cystoscopy TURBT 2-5 cm - 2020, 2017, 2016, 2016 Cystoscopy Ureteroscopy, Right - 03/11/2020, Right - 2020, Right - 2020, Left - 2018, 2015 Locm 300-399Mg /Ml Iodine,1Ml - 2020 Percut Stone Removal >2cm - 2015 Ureteroscopic stone removal - 2008       PSH Notes: Cystoscopy With Fulguration Medium Lesion (2-5cm), Cystoscopy With Fulguration Medium  Lesion (2-5cm), Bladder Injection Of Cancer Treatment, Percutaneous Lithotomy For Stone Over 2cm., Cystoscopy With Insertion Of Ureteral Stent Left, Cystoscopy With Ureteroscopy Left, Cystoscopy With Fulguration Small Lesion (5-28mm), Cystoscopy With Fulguration Small Lesion (5-65mm), Cystoscopy With Pyeloscopy With Lithotripsy, Cystoscopy With Insertion Of Ureteral Stent Right, Cystoscopy With Fulguration, Cystoscopy With Insertion Of Ureteral Stent Right, Cystoscopy With Ureteroscopy With Removal Of Calculus, Cystoscopy With Insertion Of Ureteral Stent Left   NON-GU PSH: No Non-GU PSH    GU PMH: History of bladder cancer - 02/20/2020, - 01/02/2020 Ureteral obstruction - 02/20/2020, - 2020 Acute Cystitis/UTI - 01/02/2020 Urinary Urgency (Stable) - 07/18/2019, Urinary urgency, - 2016 Urinary Frequency - 2020 Bladder Cancer overlapping sites - 2017, Malignant neoplasm of overlapping sites of bladder, - 2017 BPH w/LUTS, Benign prostatic hyperplasia (BPH) with straining on urination - 2016 Renal calculus, Nephrolithiasis - 2016      PMH Notes:   Horice Borseth is a patient previously followed by Dr. Tresa Endo. 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 He was noted to have a recurrence in 2013 and underwent TURBT in July 2013. He was also incidentally noted to have a stricture of the distal left ureter on RPG studies at that time and a brush biopsy was performed. He was found to have high grade Ta urothelial carcinoma in February 2016.    Oct 2007: TURBT, Low grade Ta  Jul 2013: TURBT (right bladder neck  1.5 cm tumor, low grade Ta),brush biopsy of distal left ureteral stricture (no malignancy)  Mar 2015: TURBT - Low grade Ta  Feb 2016: TURBT - High grade Ta, [facility]  Mar-Apr 2016: Induction BCG  Jul 2016: Maintenance BCG  Oct 2016: Multiple recurrent tumors noted on  surveillance cystoscopy  Dec 2016: TUR - Multiple tumors over right posterior bladder - High grade, Ta  Dec 2016 - Feb 2017: 6 week induction BCG  May 2017: TURBT - High grade, Ta urothelial carcinoma (two tumors)  Oct 2017: Completed induction 6 week course of intravesical gemcitabine [facility] - Dr. Ricky Ala)  Apr 2018: Two small recurrences (each < 1 cm)  Apr 2018: TURBT- High grade Ta, concern for possible lamina propria invasion for tumor at dome  May 2018: Repeat TURBT - No residual malignancy  Jul-Aug 2018: Repeat induction 6 week course of intravesical gemcitabine [facility]- Dr. Ricky Ala)  Sep 2018 - Apr 2019: Monthly maintenance gemcitabine  May 2019: Blue light cystoscopy with TURBT for 2 recurrent tumors posteriorly - High grade, Ta  Jun-Jul 2019: Repeat induction intravesical gemcitabine  Mar 2020: TURBT for two recurrent tumors - High grade, muscle invasive urothelial carcinoma with invasion into distal right ureter, right ureteral stent  Apr 2020: Patient and wife elected trimodality therapy (refused cystectomy due to severe dementia), repeat completion TURBT and right ureteral stent change  May-Jun 2020: XRT and carboplatin    2) Nephrolithiasis: He has a long standing history of calcium oxalate stones. He was incidentally noted to have a large partial staghorn calculus during retrograde pyelography in February 2015.  Apr 2015: L PCNL   3) Prostate cancer screening: We discontinued screening in 2014 due to his low risk for prostate cancer at that time and progressive dementia.  Last PSA: 0.35 (June 2014)   4) BPH/LUTS: He is treated with Avodart.   5) Right ureteral obstruction: This was noted in March 2020 when he developed hydronephrosis due to progression of bladder cancer and development of muscle invasive disease. He began management with right ureteral stent in March 2020:   Mar 2020: R ureteral stent placement (encrusted within 6 weeks)  Apr 2020: R ureteral stent change (6 x 26  [employer])  Aug 2020: R ureteral stent change (6 x 26 [employer])  Nov 2020: R ureteral stent change (6 x 26 [employer])  Feb 2021: R ureteral stent change (6 x 26 [employer])  May 2021: R ureteral stent change (6 x 26 [employer]) - only mild encrustation  Sep 2021: R ureteral stent change (8 x 26) - fungal infection on stent and possible early fungal ball formation   NON-GU PMH: Bacteriuria (Stable) - 12/14/2018, (Stable), - 2020 (Stable), - 2020 (Stable), - 2018 (Stable), - 2018, - 2018 Diabetes Type 2 Hypertension Unspecified dementia without behavioral disturbance    FAMILY HISTORY: No pertinent family history - Other Prostate Cancer - No Family History   SOCIAL HISTORY: Marital Status: Married Preferred Language: English; Ethnicity: Not Hispanic Or Latino; Race: White Current Smoking Status: Patient has never smoked.  Has never drank.  Drinks 2 caffeinated drinks per day. Has not had a blood transfusion.     Notes: Never A Smoker   REVIEW OF SYSTEMS:    GU Review Male:   Patient denies frequent urination, hard to postpone urination, burning/ pain with urination, get up at night to urinate, leakage of urine, stream starts and stops, trouble starting your streams, and have to  strain to urinate .  Gastrointestinal (Lower):   Patient denies diarrhea and constipation.  Gastrointestinal (Upper):   Patient denies nausea and vomiting.  Constitutional:   Patient denies fever, night sweats, weight loss, and fatigue.  Skin:   Patient denies skin rash/ lesion and itching.  Eyes:   Patient denies blurred vision and double vision.  Ears/ Nose/ Throat:   Patient denies sore throat and sinus problems.  Hematologic/Lymphatic:   Patient denies swollen glands and easy bruising.  Cardiovascular:   Patient denies leg swelling and chest pains.  Respiratory:   Patient denies cough and shortness of breath.  Endocrine:   Patient denies excessive thirst.   Musculoskeletal:   Patient denies back pain and joint pain.  Neurological:   Patient denies dizziness and headaches.  Psychologic:   Patient denies depression and anxiety.   VITAL SIGNS:     Weight 148 lb / 67.13 kg  Height 69 in / 175.26 cm  Temperature 96.8 F / 36 C  BMI 21.9 kg/m   MULTI-SYSTEM PHYSICAL EXAMINATION:    Constitutional: Well-nourished. No physical deformities. Normally developed. Good grooming.  Respiratory: No labored breathing, no use of accessory muscles. Clear bilaterally.  Cardiovascular: Normal temperature, normal extremity pulses, no swelling, no varicosities. Regular rate and rhythm.     Complexity of Data:  Records Review:   Previous Patient Records   11/23/12 11/18/11 11/28/10 01/08/10 12/20/03  PSA  Total PSA 0.35  0.42  0.46  0.49  2.42            ASSESSMENT:      ICD-10 Details  1 GU:   History of bladder cancer - Z85.51   2   Ureteral obstruction - N13.1    PLAN:         1. History of bladder cancer: He will tentatively keep his appointment with Dr. Alen Blew in February for further evaluation. His bladder will be re-evaluated on his next stent change.   2. Right ureteral obstruction: Despite current plans to proceed with palliative care, I do think that continuing with right ureteral stent management would appear to be appropriate to decrease his risk of requiring hospitalization. As such, we will tentatively plan to perform cystoscopy and right ureteral stent change in late January. His urine will be cultured today so that he can be treated with appropriate antibiotic prophylaxis perioperatively. Certainly, we can change this plan should his wife no further decline in his overall health. Furthermore, should he develop signs or symptoms of urinary tract infection clinically, she will notify me so we can begin treatment sooner with plans to then proceed with an additional preoperative urinalysis and culture after treatment.   Cc: Dr. [name]

## 2020-07-11 ENCOUNTER — Encounter (HOSPITAL_COMMUNITY)
Admission: RE | Disposition: A | Discharge status: Home / Self Care | Payer: Self-pay | Source: Home / Self Care | Attending: Urology

## 2020-07-11 ENCOUNTER — Ambulatory Visit (HOSPITAL_COMMUNITY)
Admission: RE | Admit: 2020-07-11 | Discharge: 2020-07-11 | Disposition: A | Discharge status: Home / Self Care | Payer: Medicare Other | Attending: Urology | Admitting: Urology

## 2020-07-11 ENCOUNTER — Ambulatory Visit (HOSPITAL_COMMUNITY): Payer: Medicare Other | Admitting: Physician Assistant

## 2020-07-11 ENCOUNTER — Other Ambulatory Visit: Payer: Self-pay

## 2020-07-11 ENCOUNTER — Ambulatory Visit (HOSPITAL_COMMUNITY): Payer: Medicare Other

## 2020-07-11 ENCOUNTER — Ambulatory Visit (HOSPITAL_COMMUNITY): Payer: Medicare Other | Admitting: Anesthesiology

## 2020-07-11 ENCOUNTER — Encounter (HOSPITAL_COMMUNITY): Payer: Self-pay | Admitting: Urology

## 2020-07-11 DIAGNOSIS — Z882 Allergy status to sulfonamides status: Secondary | ICD-10-CM | POA: Diagnosis not present

## 2020-07-11 DIAGNOSIS — N135 Crossing vessel and stricture of ureter without hydronephrosis: Secondary | ICD-10-CM | POA: Diagnosis not present

## 2020-07-11 DIAGNOSIS — Z881 Allergy status to other antibiotic agents status: Secondary | ICD-10-CM | POA: Diagnosis not present

## 2020-07-11 DIAGNOSIS — I10 Essential (primary) hypertension: Secondary | ICD-10-CM | POA: Diagnosis not present

## 2020-07-11 DIAGNOSIS — E119 Type 2 diabetes mellitus without complications: Secondary | ICD-10-CM | POA: Diagnosis not present

## 2020-07-11 DIAGNOSIS — C679 Malignant neoplasm of bladder, unspecified: Secondary | ICD-10-CM | POA: Insufficient documentation

## 2020-07-11 DIAGNOSIS — Z7984 Long term (current) use of oral hypoglycemic drugs: Secondary | ICD-10-CM | POA: Insufficient documentation

## 2020-07-11 DIAGNOSIS — Z79899 Other long term (current) drug therapy: Secondary | ICD-10-CM | POA: Diagnosis not present

## 2020-07-11 HISTORY — PX: CYSTOSCOPY WITH STENT PLACEMENT: SHX5790

## 2020-07-11 LAB — BASIC METABOLIC PANEL
Anion gap: 13 (ref 5–15)
BUN: 32 mg/dL — ABNORMAL HIGH (ref 8–23)
CO2: 24 mmol/L (ref 22–32)
Calcium: 9 mg/dL (ref 8.9–10.3)
Chloride: 103 mmol/L (ref 98–111)
Creatinine, Ser: 1.19 mg/dL (ref 0.61–1.24)
GFR, Estimated: >60 mL/min (ref >60)
Glucose, Bld: 197 mg/dL — ABNORMAL HIGH (ref 70–99)
Potassium: 4.1 mmol/L (ref 3.5–5.1)
Sodium: 140 mmol/L (ref 135–145)

## 2020-07-11 LAB — CBC
HCT: 32.3 % — ABNORMAL LOW (ref 39.0–52.0)
Hemoglobin: 10.1 g/dL — ABNORMAL LOW (ref 13.0–17.0)
MCH: 26.6 pg (ref 26.0–34.0)
MCHC: 31.3 g/dL (ref 30.0–36.0)
MCV: 85.2 fL (ref 80.0–100.0)
Platelets: 207 10*3/uL (ref 150–400)
RBC: 3.79 MIL/uL — ABNORMAL LOW (ref 4.22–5.81)
RDW: 15.1 % (ref 11.5–15.5)
WBC: 6.3 10*3/uL (ref 4.0–10.5)
nRBC: 0 % (ref 0.0–0.2)

## 2020-07-11 LAB — GLUCOSE, CAPILLARY
Glucose-Capillary: 185 mg/dL — ABNORMAL HIGH (ref 70–99)
Glucose-Capillary: 172 mg/dL — ABNORMAL HIGH (ref 70–99)

## 2020-07-11 SURGERY — CYSTOSCOPY, WITH STENT INSERTION
Anesthesia: General | Laterality: Right

## 2020-07-11 MED ORDER — LIDOCAINE 2% (20 MG/ML) 5 ML SYRINGE
INTRAMUSCULAR | Status: DC | PRN
  Administered 2020-07-11: 60 mg via INTRAVENOUS

## 2020-07-11 MED ORDER — PHENYLEPHRINE HCL (PRESSORS) 10 MG/ML IV SOLN
INTRAVENOUS | Status: AC
  Filled 2020-07-11: qty 1

## 2020-07-11 MED ORDER — FENTANYL CITRATE (PF) 100 MCG/2ML IJ SOLN
INTRAMUSCULAR | Status: DC | PRN
  Administered 2020-07-11: 25 ug via INTRAVENOUS

## 2020-07-11 MED ORDER — DEXAMETHASONE SODIUM PHOSPHATE 10 MG/ML IJ SOLN
INTRAMUSCULAR | Status: AC
  Filled 2020-07-11: qty 1

## 2020-07-11 MED ORDER — LIDOCAINE HCL (PF) 2 % IJ SOLN
INTRAMUSCULAR | Status: AC
  Filled 2020-07-11: qty 5

## 2020-07-11 MED ORDER — SODIUM CHLORIDE 0.9 % IV SOLN
2.0000 g | Freq: Once | INTRAVENOUS | Status: AC
  Administered 2020-07-11: 2 g via INTRAVENOUS
  Filled 2020-07-11: qty 20

## 2020-07-11 MED ORDER — MIDAZOLAM HCL 2 MG/2ML IJ SOLN
INTRAMUSCULAR | Status: AC
  Filled 2020-07-11: qty 2

## 2020-07-11 MED ORDER — FENTANYL CITRATE (PF) 100 MCG/2ML IJ SOLN
INTRAMUSCULAR | Status: AC
  Filled 2020-07-11: qty 2

## 2020-07-11 MED ORDER — ONDANSETRON HCL 4 MG/2ML IJ SOLN
INTRAMUSCULAR | Status: DC | PRN
  Administered 2020-07-11: 4 mg via INTRAVENOUS

## 2020-07-11 MED ORDER — SODIUM CHLORIDE 0.9 % IR SOLN
Status: DC | PRN
  Administered 2020-07-11: 3000 mL

## 2020-07-11 MED ORDER — ONDANSETRON HCL 4 MG/2ML IJ SOLN
INTRAMUSCULAR | Status: AC
  Filled 2020-07-11: qty 2

## 2020-07-11 MED ORDER — DEXAMETHASONE SODIUM PHOSPHATE 10 MG/ML IJ SOLN
INTRAMUSCULAR | Status: DC | PRN
  Administered 2020-07-11: 5 mg via INTRAVENOUS

## 2020-07-11 MED ORDER — LACTATED RINGERS IV SOLN: INTRAVENOUS | Status: DC

## 2020-07-11 MED ORDER — ACETAMINOPHEN 500 MG PO TABS
1000.0000 mg | ORAL_TABLET | Freq: Once | ORAL | Status: AC
  Administered 2020-07-11: 1000 mg via ORAL
  Filled 2020-07-11: qty 2

## 2020-07-11 MED ORDER — EPHEDRINE SULFATE-NACL 50-0.9 MG/10ML-% IV SOSY
PREFILLED_SYRINGE | INTRAVENOUS | Status: DC | PRN
  Administered 2020-07-11: 5 mg via INTRAVENOUS
  Administered 2020-07-11: 10 mg via INTRAVENOUS

## 2020-07-11 MED ORDER — PROPOFOL 10 MG/ML IV BOLUS
INTRAVENOUS | Status: AC
  Filled 2020-07-11: qty 20

## 2020-07-11 MED ORDER — CHLORHEXIDINE GLUCONATE 0.12 % MT SOLN: 15.0000 mL | Freq: Once | OROMUCOSAL | Status: AC

## 2020-07-11 MED ORDER — PROPOFOL 10 MG/ML IV BOLUS
INTRAVENOUS | Status: DC | PRN
  Administered 2020-07-11: 100 mg via INTRAVENOUS

## 2020-07-11 MED ORDER — ONDANSETRON HCL 4 MG/2ML IJ SOLN: 4.0000 mg | Freq: Once | INTRAMUSCULAR | Status: DC | PRN

## 2020-07-11 MED ORDER — ORAL CARE MOUTH RINSE
15.0000 mL | Freq: Once | OROMUCOSAL | Status: AC
  Administered 2020-07-11: 15 mL via OROMUCOSAL

## 2020-07-11 MED ORDER — FENTANYL CITRATE (PF) 100 MCG/2ML IJ SOLN: 25.0000 ug | INTRAMUSCULAR | Status: DC | PRN

## 2020-07-11 MED ORDER — PHENYLEPHRINE HCL-NACL 10-0.9 MG/250ML-% IV SOLN
INTRAVENOUS | Status: DC | PRN
  Administered 2020-07-11: 35 ug/min via INTRAVENOUS

## 2020-07-11 SURGICAL SUPPLY — 15 items
BAG URO CATCHER STRL LF (MISCELLANEOUS) ×3 IMPLANT
CATH INTERMIT  6FR 70CM (CATHETERS) ×3 IMPLANT
CLOTH BEACON ORANGE TIMEOUT ST (SAFETY) ×3 IMPLANT
GLOVE SURG ENC TEXT LTX SZ7.5 (GLOVE) ×3 IMPLANT
GOWN STRL REUS W/TWL LRG LVL3 (GOWN DISPOSABLE) ×6 IMPLANT
GUIDEWIRE STR DUAL SENSOR (WIRE) ×3 IMPLANT
GUIDEWIRE ZIPWRE .038 STRAIGHT (WIRE) IMPLANT
KIT TURNOVER KIT A (KITS) IMPLANT
MANIFOLD NEPTUNE II (INSTRUMENTS) ×3 IMPLANT
PACK CYSTO (CUSTOM PROCEDURE TRAY) ×3 IMPLANT
STENT CONTOUR NO GW 8FR 26CM (STENTS) ×3 IMPLANT
STENT POLARIS LOOP 8FR X 26 CM (STENTS) ×3 IMPLANT
TUBING CONNECTING 10 (TUBING) ×2 IMPLANT
TUBING CONNECTING 10' (TUBING) ×1
TUBING UROLOGY SET (TUBING) IMPLANT

## 2020-07-11 NOTE — Transfer of Care (Signed)
Immediate Anesthesia Transfer of Care Note  Patient: Cebert Dettmann  Procedure(s) Performed: CYSTOSCOPY WITH STENT CHANGE (Right )  Patient Location: PACU  Anesthesia Type:General  Level of Consciousness: awake, alert  and oriented  Airway & Oxygen Therapy: Patient Spontanous Breathing and Patient connected to face mask oxygen  Post-op Assessment: Report given to RN and Post -op Vital signs reviewed and stable  Post vital signs: Reviewed and stable  Last Vitals:  Vitals Value Taken Time  BP 125/52 07/11/20 1004  Temp    Pulse 62 07/11/20 1006  Resp 12 07/11/20 1006  SpO2 100 % 07/11/20 1006  Vitals shown include unvalidated device data.  Last Pain:  Vitals:   07/11/20 0905  PainSc: 0-No pain         Complications: No complications documented.

## 2020-07-11 NOTE — Discharge Instructions (Signed)

## 2020-07-11 NOTE — Op Note (Signed)
Preoperative diagnosis:  1. Right ureteral obstruction   Postoperative diagnosis:  1. Right ureteral obstruction   Procedure:  1. Cystoscopy 2. Right ureteral stent placement (8 x 26 with no string)  Surgeon: Roxy Horseman, Brooke Bonito. M.D.  Anesthesia: General  Complications: None  Intraoperative findings: His indwelling ureteral stent did demonstrate a large amount of distal purulent material although no encrustation.  EBL: Minimal  Specimens: None  Indication: [name] [name] is a 80 y.o. patient with right ureteral obstruction. After reviewing the management options for treatment, he elected to proceed with the above surgical procedure(s). We have discussed the potential benefits and risks of the procedure, side effects of the proposed treatment, the likelihood of the patient achieving the goals of the procedure, and any potential problems that might occur during the procedure or recuperation. Informed consent has been obtained.  Description of procedure:  The patient was taken to the operating room and general anesthesia was induced.  The patient was placed in the dorsal lithotomy position, prepped and draped in the usual sterile fashion, and preoperative antibiotics were administered. A preoperative time-out was performed.   Cystourethroscopy was performed.  The patient's urethra was examined and was unremarkable. The bladder was then systematically examined in its entirety. There was no evidence for any bladder tumors, stones, or other mucosal pathology.    Attention then turned to the right ureteral orifice and the patient's indwelling ureteral stent was identified and brought out to the urethral meatus with the flexible graspers.  A 0.38 sensor guidewire was then advanced up the right ureter into the renal pelvis under fluoroscopic guidance.  The wire was then backloaded through the cystoscope and a ureteral stent was advance over the wire using Seldinger technique.  The stent  was positioned appropriately under fluoroscopic and cystoscopic guidance.  The wire was then removed with an adequate stent curl noted in the renal pelvis as well as in the bladder.  The bladder was then emptied and the procedure ended.  The patient appeared to tolerate the procedure well and without complications.  The patient was able to be awakened and transferred to the recovery unit in satisfactory condition.    Pryor Curia MD

## 2020-07-11 NOTE — Anesthesia Postprocedure Evaluation (Signed)
Anesthesia Post Note  Patient: [name] [name]  Procedure(s) Performed: CYSTOSCOPY WITH STENT CHANGE (Right )     Patient location during evaluation: PACU Anesthesia Type: General Level of consciousness: awake and alert Pain management: pain level controlled Vital Signs Assessment: post-procedure vital signs reviewed and stable Respiratory status: spontaneous breathing, nonlabored ventilation and respiratory function stable Cardiovascular status: blood pressure returned to baseline and stable Postop Assessment: no apparent nausea or vomiting Anesthetic complications: no   No complications documented.  Last Vitals:  Vitals:   07/11/20 1015 07/11/20 1030  BP: 116/61 125/60  Pulse: 60 65  Resp: 15   Temp:  36.4 C  SpO2: 93% 98%    Last Pain:  Vitals:   07/11/20 1015  PainSc: 0-No pain                 Catalina Gravel

## 2020-07-11 NOTE — Anesthesia Procedure Notes (Signed)
Procedure Name: LMA Insertion Date/Time: 07/11/2020 9:38 AM Performed by: Maxwell Caul, CRNA Pre-anesthesia Checklist: Patient identified, Emergency Drugs available, Suction available and Patient being monitored Patient Re-evaluated:Patient Re-evaluated prior to induction Oxygen Delivery Method: Circle system utilized Preoxygenation: Pre-oxygenation with 100% oxygen Induction Type: IV induction LMA: LMA inserted LMA Size: 4.0 Number of attempts: 1 Placement Confirmation: positive ETCO2 and breath sounds checked- equal and bilateral Tube secured with: Tape Dental Injury: Teeth and Oropharynx as per pre-operative assessment  Comments: Upon assessment prior to induction, patient noted to have an injury to left lower lip. After placement of LMA, small amount of blood noted in area of pre-existing injury.

## 2020-07-11 NOTE — Anesthesia Preprocedure Evaluation (Signed)
Anesthesia Evaluation  Patient identified by MRN, date of birth, ID band Patient awake    Reviewed: Allergy & Precautions, NPO status , Patient's Chart, lab work & pertinent test results, reviewed documented beta blocker date and time   Airway Mallampati: II  TM Distance: >3 FB Neck ROM: Full    Dental  (+) Teeth Intact, Dental Advisory Given   Pulmonary neg pulmonary ROS,    Pulmonary exam normal breath sounds clear to auscultation       Cardiovascular hypertension, Pt. on medications and Pt. on home beta blockers Normal cardiovascular exam Rhythm:Regular Rate:Normal     Neuro/Psych PSYCHIATRIC DISORDERS Dementia Dementia   Neuromuscular disease    GI/Hepatic negative GI ROS, Neg liver ROS,   Endo/Other  diabetes, Type 2, Oral Hypoglycemic Agents  Renal/GU Renal disease (RIGHT URETERAL OBSTRUCTION)   Bladder cancer     Musculoskeletal negative musculoskeletal ROS (+)   Abdominal   Peds  Hematology negative hematology ROS (+)   Anesthesia Other Findings Day of surgery medications reviewed with the patient.  Reproductive/Obstetrics                             Anesthesia Physical Anesthesia Plan  ASA: III  Anesthesia Plan: General   Post-op Pain Management:    Induction: Intravenous  PONV Risk Score and Plan: 3 and Treatment may vary due to age or medical condition, Dexamethasone and Ondansetron  Airway Management Planned: LMA  Additional Equipment:   Intra-op Plan:   Post-operative Plan: Extubation in OR  Informed Consent: I have reviewed the patients History and Physical, chart, labs and discussed the procedure including the risks, benefits and alternatives for the proposed anesthesia with the patient or authorized representative who has indicated his/her understanding and acceptance.     Dental advisory given  Plan Discussed with: CRNA  Anesthesia Plan Comments:          Anesthesia Quick Evaluation

## 2020-07-12 ENCOUNTER — Encounter (HOSPITAL_COMMUNITY): Payer: Self-pay | Admitting: Urology

## 2020-07-12 ENCOUNTER — Ambulatory Visit (INDEPENDENT_AMBULATORY_CARE_PROVIDER_SITE_OTHER): Payer: Medicare Other | Admitting: Podiatry

## 2020-07-12 ENCOUNTER — Other Ambulatory Visit: Payer: Self-pay

## 2020-07-12 DIAGNOSIS — E119 Type 2 diabetes mellitus without complications: Secondary | ICD-10-CM

## 2020-07-12 DIAGNOSIS — Z0000 Encounter for general adult medical examination without abnormal findings: Secondary | ICD-10-CM | POA: Insufficient documentation

## 2020-07-12 DIAGNOSIS — B351 Tinea unguium: Secondary | ICD-10-CM

## 2020-07-12 DIAGNOSIS — E782 Mixed hyperlipidemia: Secondary | ICD-10-CM | POA: Insufficient documentation

## 2020-07-12 DIAGNOSIS — M79675 Pain in left toe(s): Secondary | ICD-10-CM

## 2020-07-12 DIAGNOSIS — D509 Iron deficiency anemia, unspecified: Secondary | ICD-10-CM | POA: Insufficient documentation

## 2020-07-12 DIAGNOSIS — M79674 Pain in right toe(s): Secondary | ICD-10-CM | POA: Diagnosis not present

## 2020-07-12 DIAGNOSIS — E11319 Type 2 diabetes mellitus with unspecified diabetic retinopathy without macular edema: Secondary | ICD-10-CM | POA: Insufficient documentation

## 2020-07-14 NOTE — Progress Notes (Signed)
Subjective: [name] [name] presents today painful thick toenails that are difficult to trim. Pain interferes with ambulation. Aggravating factors include wearing enclosed shoe gear. Pain is relieved with periodic professional debridement.   Wife is present during today's visit. Wife states [name] [name] has had several skin cancers removed. They voice no new pedal problems on today's visit.  [name] Seashore, MD is patient's PCP. Last visit was: 07/04/2020.  Past Medical History:  Diagnosis Date  . Alzheimer disease ([place])   . Alzheimer's disease ([place])   . Anemia   . Bilateral cold feet   . Bladder cancer ([place])    dx in 2013.07/08/19 skin CA in neck  . BPH (benign prostatic hypertrophy)   . Dementia ([place]) 12-11-11   memory short term(steadily progressive worsening-pt. unaware) .dx. Alzheimers  . Diabetes mellitus 12-11-11   oral meds only type II   . 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 Hard of hearing   . Hearing loss    has one hearing aid, doesn't wear  . History of chemotherapy   . History of gallstones 2005  . History of kidney stones   . Hx of nonmelanoma skin cancer   . Hypercholesterolemia   . Hypertension 12-11-11   controlled with meds  . Increased frequency of urination    wife reports urinary frequency has not improved since last surgery   . Personal history of radiation exposure   . Pulmonary nodules    Bilateral  . Pyelonephritis    hospital admission july 2020  . Renal calculus, left 10/12/2013  . Urinary urgency   . UTI (urinary tract infection) 08-09-13   multiple, last tx. 1 week ago     Patient Active Problem List   Diagnosis Date Noted  . Diabetic retinopathy associated with type 2 diabetes mellitus ([place]) 07/12/2020  . Iron deficiency anemia 07/12/2020  . Mixed hyperlipidemia 07/12/2020  . Encounter for general adult medical examination without abnormal findings 07/12/2020  . Dehydration 12/28/2019  . DNR (do not  resuscitate) 12/28/2019  . Cancer of skin of neck 08/02/2019  . 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 Severe sepsis ([place]) 01/06/2019  . Acute lower UTI 01/06/2019  . Dementia without behavioral disturbance ([place]) 01/06/2019  . Sepsis ([place]) 01/06/2019  . History of subdural hematoma 01/06/2019  . Cancer of lateral wall of urinary bladder ([place]) 03/10/2016  . Renal calculus, left 10/12/2013  . Memory loss 03/01/2013  . Essential hypertension 03/01/2013  . Type 2 diabetes mellitus without complication ([place]) 16/03/9603    Current Outpatient Medications on File Prior to Visit  Medication Sig Dispense Refill  . dutasteride (AVODART) 0.5 MG capsule Take 1 capsule by mouth daily.    . hydrochlorothiazide (HYDRODIURIL) 12.5 MG tablet 1 tablet in the morning    . insulin lispro (HUMALOG KWIKPEN) 100 UNIT/ML KwikPen 6 units    . Insulin Pen Needle (PEN NEEDLES) 31G X 5 MM MISC See admin instructions.    . metFORMIN (GLUCOPHAGE-XR) 750 MG 24 hr tablet 1 tablet with evening meal    . sitaGLIPtin (JANUVIA) 100 MG tablet 1 tablet    . Cyanocobalamin (VITAMIN B-12 CR PO) 1 tablet    . Fish Oil-Cholecalciferol (FISH OIL + D3 PO) 1 capsule    . fluconazole (DIFLUCAN) 100 MG tablet Take 100 mg by mouth daily.    . memantine (NAMENDA XR) 28 MG CP24 24 hr capsule 1 capsule    . metoprolol succinate (TOPROL-XL)  25 MG 24 hr tablet Take 1 tablet by mouth daily.    . Multiple Vitamin (MULTI-VITAMIN DAILY PO) 1 tablet    . Multiple Vitamin (MULTIVITAMIN WITH MINERALS) TABS tablet Take 1 tablet by mouth every evening.    . Omega-3 Fatty Acids (RA FISH OIL) 1000 MG CAPS Take by mouth.    [name] [name] DELICA LANCETS 11X MISC 2 (two) times daily. as directed  4  . perindopril (ACEON) 4 MG tablet Take 1 tablet by mouth daily.     No current facility-administered medications on file prior to visit.     Allergies  Allergen Reactions  . Sulfa Antibiotics Hives  . Tetracyclines &  Related Rash  . Aricept [Donepezil Hcl] Nausea And Vomiting  . Other Other (See Comments)  . Tetracycline Hcl Other (See Comments)   Objective: [name] [name] is a pleasant 80 y.o. Caucasian male in NAD. Patient somnolent, but arousable. Wife states this is very early for him.  There were no vitals filed for this visit.  Vascular Examination: Capillary refill time to digits immediate b/l. Palpable pedal pulses b/l LE. Pedal hair absent. Lower extremity skin temperature gradient within normal limits. No pain with calf compression b/l. Trace edema noted b/l lower extremities.  Dermatological Examination: Pedal skin is thin shiny, atrophic b/l lower extremities. No open wounds bilaterally. No interdigital macerations bilaterally. Toenails 1-5 b/l elongated, discolored, dystrophic, thickened, crumbly with subungual debris and tenderness to dorsal palpation.  Musculoskeletal: Muscle strength 4/5to all LE muscle groups of b/l lower extremities. No pain crepitus or joint limitation noted with ROM b/l. No gross bony deformities bilaterally.  Neurological Examination: Protective sensation intact 5/5 intact bilaterally with 10g monofilament b/l. Clonus negative b/l.  Last A1c: Hemoglobin A1C Latest Ref Rng & Units 02/29/2020 12/28/2019 08/01/2019  HGBA1C 4.8 - 5.6 % 7.7(H) 8.7(H) 6.4(H)  Some recent data might be hidden    Assessment: 1. Pain due to onychomycosis of toenails of both feet   2. Controlled type 2 diabetes mellitus without complication, without long-term current use of insulin ([place])    Plan: -Examined patient. -No new findings. No new orders. -Toenails 1-5 b/l were debrided in length and girth with sterile nail nippers and dremel without iatrogenic bleeding.  -Patient to report any pedal injuries to medical professional immediately. -Patient to continue soft, supportive shoe gear daily. -Patient/POA to call should there be question/concern in the interim.  Return in about 3  months (around 10/10/2020).  Marzetta Board, DPM

## 2020-07-16 DIAGNOSIS — C4442 Squamous cell carcinoma of skin of scalp and neck: Secondary | ICD-10-CM | POA: Diagnosis not present

## 2020-07-16 DIAGNOSIS — Z483 Aftercare following surgery for neoplasm: Secondary | ICD-10-CM | POA: Diagnosis not present

## 2020-08-08 ENCOUNTER — Other Ambulatory Visit: Payer: Medicare Other | Admitting: Hospice

## 2020-08-08 ENCOUNTER — Other Ambulatory Visit: Payer: Self-pay

## 2020-08-08 DIAGNOSIS — F0390 Unspecified dementia without behavioral disturbance: Secondary | ICD-10-CM

## 2020-08-08 DIAGNOSIS — Z515 Encounter for palliative care: Secondary | ICD-10-CM | POA: Diagnosis not present

## 2020-08-08 DIAGNOSIS — E119 Type 2 diabetes mellitus without complications: Secondary | ICD-10-CM

## 2020-08-08 NOTE — Progress Notes (Addendum)
[facility] Consult Note Telephone: [phone]  Fax: [phone]  PATIENT NAME: [name] [name] DOB: Jul 22, 1940 MRN: [id number]  PRIMARY CARE PROVIDER:   [name], MD [name], [name] [place] [place],  [place] [address]  REFERRING PROVIDER: [name], MD [name], [place],  [place] [address]  RESPONSIBLE PARTY:[name] [name] [phone] c   Visit is to build trust and highlight Palliative Medicine as specialized medical care for people living with serious illness, aimed at facilitating better quality of life through symptoms relief, assisting with advance care plan and establishing goals of care.   CHIEF COMPLAINT: Follow up palliative visit/Type 2 DM  RECOMMENDATIONS/PLAN:   1. Advance Care Planning/Code Status: CODE STATUS reviewed today.  Patient is a DO NOT RESUSCITATE.  2. Goals of Care: Goals of care include to maximize quality of life and symptom management. No more appointments with Oncology; family has decided on nore chemotherapy or radiation.   Visit consisted of counseling and education dealing with the complex and emotionally intense issues of symptom management and palliative care in the setting of serious and potentially life-threatening illness.  Gardening, reading and christian faith help [name] [name] cope with the burden of caregiving. Palliative care team will continue to support patient, patient's family, and medical team.  I spent  20 minutes providing this consultation. More than 50% of the time in this consultation was spent on coordinating communication.  -------------------------------------------------------------------------------------------------------------------------------------------------- 3. Symptom management/Plan:  Type 2 DM. No report of hypoglycemic/hyperglycemic incidents. Continue Januvia and Metformin. No  concentrated sweets. Last A1c Jan 2022 is 8.2.  Repeat A1c in 3 months. Dementia:Ongoing memory loss and confusionrelated toDementiaFAST 6c, FLACC 0. Continue ongoing supportive care.  Palliative will continue to monitor for symptom management/decline and make recommendations as needed. Return 2 months or prn. Encouraged to call provider sooner with any concerns.   Family /Caregiver/Community Supports:Patient lives at home with his wife [name] [name] who is the primary caregiver and involved in his care. [name] [name] faith and family support are key factors helping spouse/patient to cope with his health challenges.Strong support system identified.  HISTORY OF PRESENT ILLNESS:  [name] [name] is a 80 y.o. male with multiple medical problems including Type 2 DM chronic, currently managed with Metformin Januvia; no neuropathic involvement at this time, no report of hypoglycemic/hyperglycemic episodes.  History of Alzheimer dementia- FAST 6c, HTN, hx of bladder cancer, cancer of skin of neck s/p chemo and radiation.Palliative Care was asked to help address goals of care. History obtained from review of EMR, discussion with patient/family. Review and summarization of Epic records shows history from other than patient. Rest of 10 point ROS asked and negative. Palliative Care was asked to follow this patient by consultation request of [name], MD to help address complex decision making in the context of goals of care.   CODE STATUS: DNR  PPS: 50%  HOSPICE ELIGIBILITY/DIAGNOSIS: TBD  PAST MEDICAL HISTORY:  Past Medical History:  Diagnosis Date  . Alzheimer disease ([place])   . Alzheimer's disease ([place])   . Anemia   . Bilateral cold feet   . Bladder cancer ([place])    dx in 2013.07/08/19 skin CA in neck  . BPH (benign prostatic hypertrophy)   . Dementia ([place]) 12-11-11   memory short term(steadily progressive worsening-pt. unaware) .dx. Alzheimers  . Diabetes mellitus 12-11-11   oral meds only  type II   . 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   moderate  left hemidiaphragm elevation  . Hard of hearing   . Hearing loss    has one hearing aid, doesn't wear  . History of chemotherapy   . History of gallstones 2005  . History of kidney stones   . Hx of nonmelanoma skin cancer   . Hypercholesterolemia   . Hypertension 12-11-11   controlled with meds  . Increased frequency of urination    wife reports urinary frequency has not improved since last surgery   . Personal history of radiation exposure   . Pulmonary nodules    Bilateral  . Pyelonephritis    hospital admission july 2020  . Renal calculus, left 10/12/2013  . Urinary urgency   . UTI (urinary tract infection) 08-09-13   multiple, last tx. 1 week ago    SOCIAL HX: @SOCX  Patient in lives at home with his spouse for ongoing care   FAMILY HX:  Family History  Problem Relation Age of Onset  . Breast cancer Sister   . Colon cancer Brother 42    Review lab tests/diagnostics Results for KAELAN, AMBLE" (MRN [id number]) as of 09/25/2020 14:58  Ref. Range 02/29/2020 14:09  Hemoglobin A1C Latest Ref Range: 4.8 - 5.6 % 7.7 (H)   No results for input(s): WBC, HGB, HCT, PLT, MCV in the last 168 hours. No results for input(s): NA, K, CL, CO2, BUN, CREATININE, GLUCOSE in the last 168 hours. Latest GFR by Cockcroft Gault (not valid in AKI or ESRD) CrCl cannot be calculated (Patient's most recent lab result is older than the maximum 21 days allowed.). No results for input(s): AST, ALT, ALKPHOS, GGT in the last 168 hours.  Invalid input(s): TBILI, CONJBILI, ALB, TOTALPROTEIN No components found for: ALB No results for input(s): APTT, INR in the last 168 hours.  Invalid input(s): PTPATIENT No results for input(s): BNP, PROBNP in the last 168 hours.  ALLERGIES:  Allergies  Allergen Reactions  . Sulfa Antibiotics Hives  . Tetracyclines & Related Rash  . Aricept [Donepezil Hcl] Nausea And Vomiting  . Other Other (See  Comments)  . Tetracycline Hcl Other (See Comments)      PERTINENT MEDICATIONS:  Outpatient Encounter Medications as of 08/08/2020  Medication Sig  . Cyanocobalamin (VITAMIN B-12 CR PO) 1 tablet  . dutasteride (AVODART) 0.5 MG capsule Take 1 capsule by mouth daily.  . Fish Oil-Cholecalciferol (FISH OIL + D3 PO) 1 capsule  . fluconazole (DIFLUCAN) 100 MG tablet Take 100 mg by mouth daily.  . hydrochlorothiazide (HYDRODIURIL) 12.5 MG tablet 1 tablet in the morning  . insulin lispro (HUMALOG KWIKPEN) 100 UNIT/ML KwikPen 6 units  . Insulin Pen Needle (PEN NEEDLES) 31G X 5 MM MISC See admin instructions.  . memantine (NAMENDA XR) 28 MG CP24 24 hr capsule 1 capsule  . metFORMIN (GLUCOPHAGE-XR) 750 MG 24 hr tablet 1 tablet with evening meal  . metoprolol succinate (TOPROL-XL) 25 MG 24 hr tablet Take 1 tablet by mouth daily.  . Multiple Vitamin (MULTI-VITAMIN DAILY PO) 1 tablet  . Multiple Vitamin (MULTIVITAMIN WITH MINERALS) TABS tablet Take 1 tablet by mouth every evening.  . Omega-3 Fatty Acids (RA FISH OIL) 1000 MG CAPS Take by mouth.  [name] DELICA LANCETS 79G MISC 2 (two) times daily. as directed  . perindopril (ACEON) 4 MG tablet Take 1 tablet by mouth daily.  . sitaGLIPtin (JANUVIA) 100 MG tablet 1 tablet   No facility-administered encounter medications on file as of 08/08/2020.    ROS  General: NAD EYES: denies  vision changes ENMT: denies dysphagia no xerostomia Cardiovascular: denies chest pain Pulmonary: denies  cough, denies SOB  Abdomen: endorses fair appetite, no constipation or diarrhea GU: denies dysuria or urinary frquency MSK:  endorses ROM limitations, no falls reported Skin: denies rashes or wounds Neurological: endorses weakness, denies pain, denies insomnia Endocrinal: Denies lethargy, no cold/heat intolerance Psych: Endorses positive mood Heme/lymph/immuno: denies bruises, abnormal bleeding   PHYSICAL EXAM  General: In no acute distress,  cooperative Cardiovascular: regular rate and rhythm Pulmonary: no cough, no increased work of breathing, normal respiratory effort Abdomen: soft, non tender, positive bowel sounds in all quadrants GU:  no suprapubic tenderness Eyes: Normal lids, no discharge, sclera anicteric ENMT: Moist mucous membranes Musculoskeletal:  no edema in BLE Skin: dry thin skin that bruises easily, warm without cyanosis Psych: non-anxious affect Neurological: Alert and oriented x2.  Weakness but otherwise non focal Heme/lymph/immuno: no bruises, no bleeding  Thank you for the opportunity to participate in the care of Isacc Turney Please call our office at [phone] if we can be of additional assistance.  Note: Portions of this note were generated with Lobbyist. Dictation errors may occur despite best attempts at proofreading.  Teodoro Spray, NP

## 2020-08-09 ENCOUNTER — Ambulatory Visit: Payer: Federal, State, Local not specified - PPO | Admitting: Oncology

## 2020-08-14 DIAGNOSIS — C44329 Squamous cell carcinoma of skin of other parts of face: Secondary | ICD-10-CM | POA: Diagnosis not present

## 2020-08-14 DIAGNOSIS — C44399 Other specified malignant neoplasm of skin of other parts of face: Secondary | ICD-10-CM | POA: Diagnosis not present

## 2020-08-14 DIAGNOSIS — Z85828 Personal history of other malignant neoplasm of skin: Secondary | ICD-10-CM | POA: Diagnosis not present

## 2020-08-14 DIAGNOSIS — D485 Neoplasm of uncertain behavior of skin: Secondary | ICD-10-CM | POA: Diagnosis not present

## 2020-08-14 DIAGNOSIS — L570 Actinic keratosis: Secondary | ICD-10-CM | POA: Diagnosis not present

## 2020-09-24 DIAGNOSIS — Z8551 Personal history of malignant neoplasm of bladder: Secondary | ICD-10-CM | POA: Diagnosis not present

## 2020-09-24 DIAGNOSIS — N131 Hydronephrosis with ureteral stricture, not elsewhere classified: Secondary | ICD-10-CM | POA: Diagnosis not present

## 2020-09-24 DIAGNOSIS — R8271 Bacteriuria: Secondary | ICD-10-CM | POA: Diagnosis not present

## 2020-09-25 ENCOUNTER — Other Ambulatory Visit: Payer: Self-pay

## 2020-09-25 ENCOUNTER — Other Ambulatory Visit: Payer: Medicare Other | Admitting: Hospice

## 2020-09-25 DIAGNOSIS — F0390 Unspecified dementia without behavioral disturbance: Secondary | ICD-10-CM | POA: Diagnosis not present

## 2020-09-25 DIAGNOSIS — Z515 Encounter for palliative care: Secondary | ICD-10-CM | POA: Diagnosis not present

## 2020-09-25 DIAGNOSIS — C672 Malignant neoplasm of lateral wall of bladder: Secondary | ICD-10-CM

## 2020-09-25 DIAGNOSIS — E119 Type 2 diabetes mellitus without complications: Secondary | ICD-10-CM

## 2020-09-25 NOTE — Progress Notes (Signed)
[facility] Consult Note Telephone: [phone]  Fax: [phone]  PATIENT NAME: [name] [name] DOB: Nov 09, 1940 MRN: [id number]  PRIMARY CARE PROVIDER:   [name], [address], [place], [place],  [place] [address]  REFERRING PROVIDER: [name], MD [name], [place],  [place] [address]  RESPONSIBLE PARTYKalib Bhagat [phone]    Name Relation Home Work [place] Spouse [phone]  [phone]   Luis, Nickles [phone]  [phone]     Visit is to build trust and highlight Palliative Medicine as specialized medical care for people living with serious illness, aimed at facilitating better quality of life through symptoms relief, assisting with advance care plan and establishing goals of care.   RECOMMENDATIONS/PLAN:   1. Advance Care Planning/Code Status: Patient is a DO NOT RESUSCITATE.  2. Goals of Care: Goals of care include to maximize quality of life and symptom management. No more appointments with Oncology; family has decided on no more chemotherapy or radiation.  Symptom management/Plan:  Type 2 DM: Stable Continue Januvia and Metformin. No concentrated sweets. Last A1c Jan 2022 is 8.2.  Repeat A1c in 2 months with PCP scheduled visit for June 2022. Dementia: Ongoing memory loss and confusionrelated toDementiaFAST 6c, FLACC 0. Patient's appetite is fair to good, sleeping more than before, up to 18 hours a day. Continue ongoing supportive care.  Bladder CA: No more chemo, no appointments with Oncology. Plan to stent with Urologist May 2022.  Follow up Palliative Care Visit: Palliative care will continue to follow for complex medical decision making, advance care planning, and clarification of goals. Return 8 weeks or prn.Encouraged to call provider sooner with any concerns.  CHIEF  COMPLAINT: Palliative follow up on Type 2 DM  HISTORY OF PRESENT ILLNESS:  [name] [name] is a 80 y.o. male with multiple medical problems including Type 2 DM chronic,  Last A1c Jan 2022 is 8.2. Type 2 DM impairs quality of life; recently no report of hypoglycemic/hyperglycemic episodes. Spouse reports no concentrated sweets which increases hyperglycemia. Adherence to Metformin Januvia has been helpful in maintaining control.  History of Alzheimer dementia- FAST 6c, HTN, hx of bladder cancer, cancer of skin of neck s/p chemo and radiation.History obtained from review of EMR, discussion with primary team, and  interview with family, caregiver  and/or patient. Records reviewed and summarized above. All 10 point systems reviewed and are negative except as documented in history of present illness above  Review and summarization of Epic records shows history from other than patient.   Palliative Care was asked to follow this patient by consultation request of [name], MD to help address complex decision making in the context of advance care planning and goals of care clarification.   CODE STATUS: DNR  PPS: 50%  HOSPICE ELIGIBILITY/DIAGNOSIS: TBD  PAST MEDICAL HISTORY:  Past Medical History:  Diagnosis Date  . Alzheimer disease ([place])   . Alzheimer's disease ([place])   . Anemia   . Bilateral cold feet   . Bladder cancer ([place])    dx in 2013.07/08/19 skin CA in neck  . BPH (benign prostatic hypertrophy)   . Dementia ([place]) 12-11-11   memory short term(steadily progressive worsening-pt. unaware) .dx. Alzheimers  . Diabetes mellitus 12-11-11   oral meds only type II   . 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 Hard of hearing   .  Hearing loss    has one hearing aid, doesn't wear  . History of chemotherapy   . History of gallstones 2005  . History of kidney stones   . Hx of nonmelanoma skin cancer   . Hypercholesterolemia   . Hypertension 12-11-11    controlled with meds  . Increased frequency of urination    wife reports urinary frequency has not improved since last surgery   . Personal history of radiation exposure   . Pulmonary nodules    Bilateral  . Pyelonephritis    hospital admission july 2020  . Renal calculus, left 10/12/2013  . Urinary urgency   . UTI (urinary tract infection) 08-09-13   multiple, last tx. 1 week ago     SOCIAL HX: @SOCX  Patient lives at home for ongoing care   FAMILY HX:  Family History  Problem Relation Age of Onset  . Breast cancer Sister   . Colon cancer Brother 42    Review lab tests/diagnostics Results for ZELMA, MAZARIEGO" (MRN [id number]) as of 09/25/2020 16:38  Ref. Range 07/11/2020 09:15  Sodium Latest Ref Range: 135 - 145 mmol/L 140  Potassium Latest Ref Range: 3.5 - 5.1 mmol/L 4.1  Chloride Latest Ref Range: 98 - 111 mmol/L 103  CO2 Latest Ref Range: 22 - 32 mmol/L 24  Glucose Latest Ref Range: 70 - 99 mg/dL 197 (H)  BUN Latest Ref Range: 8 - 23 mg/dL 32 (H)  Creatinine Latest Ref Range: 0.61 - 1.24 mg/dL 1.19  Calcium Latest Ref Range: 8.9 - 10.3 mg/dL 9.0  Anion gap Latest Ref Range: 5 - 15  13  GFR, Estimated Latest Ref Range: >60 mL/min >60   Results for TADEO, BESECKER" (MRN [id number]) as of 09/25/2020 16:38  Ref. Range 02/29/2020 14:09  Hemoglobin A1C Latest Ref Range: 4.8 - 5.6 % 7.7 (H)    ALLERGIES:  Allergies  Allergen Reactions  . Sulfa Antibiotics Hives  . Tetracyclines & Related Rash  . Aricept [Donepezil Hcl] Nausea And Vomiting  . Other Other (See Comments)  . Tetracycline Hcl Other (See Comments)      PERTINENT MEDICATIONS:  Outpatient Encounter Medications as of 09/25/2020  Medication Sig  . Cyanocobalamin (VITAMIN B-12 CR PO) 1 tablet  . dutasteride (AVODART) 0.5 MG capsule Take 1 capsule by mouth daily.  . Fish Oil-Cholecalciferol (FISH OIL + D3 PO) 1 capsule  . fluconazole (DIFLUCAN) 100 MG tablet Take 100 mg by mouth daily.  .  hydrochlorothiazide (HYDRODIURIL) 12.5 MG tablet 1 tablet in the morning  . insulin lispro (HUMALOG KWIKPEN) 100 UNIT/ML KwikPen 6 units  . Insulin Pen Needle (PEN NEEDLES) 31G X 5 MM MISC See admin instructions.  . memantine (NAMENDA XR) 28 MG CP24 24 hr capsule 1 capsule  . metFORMIN (GLUCOPHAGE-XR) 750 MG 24 hr tablet 1 tablet with evening meal  . metoprolol succinate (TOPROL-XL) 25 MG 24 hr tablet Take 1 tablet by mouth daily.  . Multiple Vitamin (MULTI-VITAMIN DAILY PO) 1 tablet  . Multiple Vitamin (MULTIVITAMIN WITH MINERALS) TABS tablet Take 1 tablet by mouth every evening.  . Omega-3 Fatty Acids (RA FISH OIL) 1000 MG CAPS Take by mouth.  [name] DELICA LANCETS 49Q MISC 2 (two) times daily. as directed  . perindopril (ACEON) 4 MG tablet Take 1 tablet by mouth daily.  . sitaGLIPtin (JANUVIA) 100 MG tablet 1 tablet   No facility-administered encounter medications on file as of 09/25/2020.      ROS  General: NAD EYES:  denies vision changes ENMT: denies dysphagia no xerostomia Cardiovascular: denies chest pain Pulmonary: denies cough, denies SOB  Abdomen: endorses fair appetite, no constipation or diarrhea GU: denies dysuria or urinary frequency MSK: endorses ROM limitations, no falls reported Skin: healing scars and wounds in L ear, head and neck areas from CA Neurological: endorses weakness, denies pain, denies insomnia Endocrinal: Denies lethargy, no cold/heat intolerance Psych: Endorses positive mood Heme/lymph/immuno: denies bruises, abnormal bleeding  I spent 45 minutes providing this consultation; time includes spent with patient/family, chart review and documentation. More than 50% of the time in this consultation was spent on counseling and coordinating communication   Thank you for the opportunity to participate in the care of Helio Lack Please call our office at [phone] if we can be of additional assistance.  Note: Portions of this note were  generated with Lobbyist. Dictation errors may occur despite best attempts at proofreading.  Teodoro Spray, NP

## 2020-10-10 DIAGNOSIS — D1801 Hemangioma of skin and subcutaneous tissue: Secondary | ICD-10-CM | POA: Diagnosis not present

## 2020-10-10 DIAGNOSIS — L570 Actinic keratosis: Secondary | ICD-10-CM | POA: Diagnosis not present

## 2020-10-10 DIAGNOSIS — L853 Xerosis cutis: Secondary | ICD-10-CM | POA: Diagnosis not present

## 2020-10-10 DIAGNOSIS — Z85828 Personal history of other malignant neoplasm of skin: Secondary | ICD-10-CM | POA: Diagnosis not present

## 2020-10-15 ENCOUNTER — Other Ambulatory Visit: Payer: Self-pay | Admitting: Urology

## 2020-10-25 ENCOUNTER — Ambulatory Visit: Payer: Federal, State, Local not specified - PPO | Admitting: Podiatry

## 2020-11-08 ENCOUNTER — Other Ambulatory Visit: Payer: Medicare Other | Admitting: Hospice

## 2020-11-08 ENCOUNTER — Other Ambulatory Visit: Payer: Self-pay

## 2020-11-08 DIAGNOSIS — Z515 Encounter for palliative care: Secondary | ICD-10-CM | POA: Diagnosis not present

## 2020-11-08 DIAGNOSIS — C672 Malignant neoplasm of lateral wall of bladder: Secondary | ICD-10-CM

## 2020-11-08 DIAGNOSIS — F0390 Unspecified dementia without behavioral disturbance: Secondary | ICD-10-CM

## 2020-11-08 NOTE — Progress Notes (Signed)
[facility] Consult Note Telephone: [phone]  Fax: [phone]  PATIENT NAME: [name] [name] DOB: [birth year] MRN: [id number]  PRIMARY CARE PROVIDER:   [name], [place], [place], [place],  [place] [address]  REFERRING PROVIDER: [name], [place], [place], [place],  [place] [address]  RESPONSIBLE PARTY:   Contact Information    Name Relation Home Work Mobile   [place] Spouse [phone]  [phone]   [place], [place] [phone]  [phone]     TELEHEALTH VISIT STATEMENT Due to the COVID-19 crisis, this visit was done via telemedicine and it was initiated and consent by this patient and or family. Video-audio (telehealth) contact was unable to be done due to technical barriers from the patient's side.  Visit is to build trust and highlight Palliative Medicine as specialized medical care for people living with serious illness, aimed at facilitating better quality of life through symptoms relief, assisting with advance care planning and complex medical decision making.   RECOMMENDATIONS/PLAN:   Advance Care Planning/Code Status: Patient is a Do Not Resuscitate  Goals of Care: Goals of care include to maximize quality of life and symptom management.  Visit consisted of counseling and education dealing with the complex and emotionally intense issues of symptom management and palliative care in the setting of serious and potentially life-threatening illness. Palliative care team will continue to support patient, patient's family, and medical team.  Symptom management/Plan:  Dementia: At baseline memory loss and confusion. Continue ongoing supportive care. Type 2 DM: Stable. Continue Januvia and Metformin. No concentrated sweets. Last A1c Jan 2022 is 8.2.Repeat A1c with PCP scheduled visit  June 2022. Bladder cancer: No plan  for chemo.  Plan with urologist 12/02/2020 for cystoscopy with stent change Follow up: Palliative care will continue to follow for complex medical decision making, advance care planning, and clarification of goals. Return 6 weeks or prn. Encouraged to call provider sooner with any concerns.  CHIEF COMPLAINT: Palliative follow up visit/dementia  HISTORY OF PRESENT ILLNESS:  [name] [name] a 80 y.o. male with multiple medical problems including bladder cancer, dementia, type 2 diabetes mellitus, hypertension. Ongoing memory loss and confusion at baseline, per spouse [name] [name]; incontinent of bladder, continent of bowel, fast 6C, FLACC 0. Patient denies pain/discomfort. Review and summarization of Epic records shows history from other than patient.   Palliative Care was asked to follow this patient to help address complex decision making in the context of advance care planning and goals of care clarification.   ROS  General: NAD EYES: denies vision changes ENMT: denies dysphagia no xerostomia Cardiovascular: denies chest pain Pulmonary: deniescough, denies SOB  Abdomen: endorses fair appetite, no constipation or diarrhea GU: denies dysuria, endorses urinary incontinence SNK:[id number] ROM limitations, no falls reported Skin: healing scars and wounds in L ear, head and neck areas from CA Neurological: endorses weakness, denies pain, denies insomnia Endocrinal: Denies lethargy, no cold/heat intolerance Psych: Endorses positive mood Heme/lymph/immuno: denies bruises, abnormal bleeding  PPS: 50%   PERTINENT MEDICATIONS:  Outpatient Encounter Medications as of 11/08/2020  Medication Sig  . Cyanocobalamin (VITAMIN B-12 CR PO) 1 tablet  . dutasteride (AVODART) 0.5 MG capsule Take 1 capsule by mouth daily.  . Fish Oil-Cholecalciferol (FISH OIL + D3 PO) 1 capsule  . fluconazole (DIFLUCAN) 100 MG tablet Take 100 mg by mouth daily.  . hydrochlorothiazide (HYDRODIURIL) 12.5 MG tablet 1 tablet in  the morning  . insulin lispro (  HUMALOG KWIKPEN) 100 UNIT/ML KwikPen 6 units  . Insulin Pen Needle (PEN NEEDLES) 31G X 5 MM MISC See admin instructions.  . memantine (NAMENDA XR) 28 MG CP24 24 hr capsule 1 capsule  . metFORMIN (GLUCOPHAGE-XR) 750 MG 24 hr tablet 1 tablet with evening meal  . metoprolol succinate (TOPROL-XL) 25 MG 24 hr tablet Take 1 tablet by mouth daily.  . Multiple Vitamin (MULTI-VITAMIN DAILY PO) 1 tablet  . Multiple Vitamin (MULTIVITAMIN WITH MINERALS) TABS tablet Take 1 tablet by mouth every evening.  . Omega-3 Fatty Acids (RA FISH OIL) 1000 MG CAPS Take by mouth.  [name] DELICA LANCETS 99I MISC 2 (two) times daily. as directed  . perindopril (ACEON) 4 MG tablet Take 1 tablet by mouth daily.  . sitaGLIPtin (JANUVIA) 100 MG tablet 1 tablet   No facility-administered encounter medications on file as of 11/08/2020.    HOSPICE ELIGIBILITY/DIAGNOSIS: TBD  PAST MEDICAL HISTORY:  Past Medical History:  Diagnosis Date  . Alzheimer disease ([place])   . Alzheimer's disease ([place])   . Anemia   . Bilateral cold feet   . Bladder cancer ([place])    dx in 2013.07/08/19 skin CA in neck  . BPH (benign prostatic hypertrophy)   . Dementia ([place]) 12-11-11   memory short term(steadily progressive worsening-pt. unaware) .dx. Alzheimers  . Diabetes mellitus 12-11-11   oral meds only type II   . 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 Hard of hearing   . Hearing loss    has one hearing aid, doesn't wear  . History of chemotherapy   . History of gallstones 2005  . History of kidney stones   . Hx of nonmelanoma skin cancer   . Hypercholesterolemia   . Hypertension 12-11-11   controlled with meds  . Increased frequency of urination    wife reports urinary frequency has not improved since last surgery   . Personal history of radiation exposure   . Pulmonary nodules    Bilateral  . Pyelonephritis    hospital admission july 2020  . Renal calculus,  left 10/12/2013  . Urinary urgency   . UTI (urinary tract infection) 08-09-13   multiple, last tx. 1 week ago    ALLERGIES:  Allergies  Allergen Reactions  . Sulfa Antibiotics Hives  . Tetracyclines & Related Rash  . Aricept [Donepezil Hcl] Nausea And Vomiting  . Other Other (See Comments)  . Tetracycline Hcl Other (See Comments)      I spent 40 minutes providing this consultation; this includes time spent with patient/family, chart review and documentation. More than 50% of the time in this consultation was spent on counseling and coordinating communication   Thank you for the opportunity to participate in the care of [name] [name] Please call our office at [phone] if we can be of additional assistance.  Note: Portions of this note were generated with Lobbyist. Dictation errors may occur despite best attempts at proofreading.  Teodoro Spray, NP

## 2020-11-19 NOTE — Patient Instructions (Signed)
DUE TO COVID-19 ONLY ONE VISITOR IS ALLOWED TO COME WITH YOU AND STAY IN THE WAITING ROOM ONLY DURING PRE OP AND PROCEDURE DAY OF SURGERY. THE 1 VISITOR  MAY VISIT WITH YOU AFTER SURGERY IN YOUR PRIVATE ROOM DURING VISITING HOURS ONLY!                [name] [name]   Your procedure is scheduled on: 12/02/20   Report to [facility] Main  Entrance   Report to admitting at: 9:15 AM     Call this number if you have problems the morning of surgery [phone]    Remember: Do not eat food :After Midnight. Clear liquids until: 8:15 am   CLEAR LIQUID DIET  Foods Allowed                                                                     Foods Excluded  Coffee and tea, regular and decaf                             liquids that you cannot  Plain Jell-O any favor except red or purple                                           see through such as: Fruit ices (not with fruit pulp)                                     milk, soups, orange juice  Iced Popsicles                                    All solid food Carbonated beverages, regular and diet                                    Cranberry, grape and apple juices Sports drinks like Gatorade Lightly seasoned clear broth or consume(fat free) Sugar, honey syrup  Sample Menu Breakfast                                Lunch                                     Supper Cranberry juice                    Beef broth                            Chicken broth Jell-O  Grape juice                           Apple juice Coffee or tea                        Jell-O                                      Popsicle                                                Coffee or tea                        Coffee or tea  _____________________________________________________________________  BRUSH YOUR TEETH MORNING OF SURGERY AND RINSE YOUR MOUTH OUT, NO CHEWING GUM CANDY OR MINTS.    Take these medicines the morning of surgery  with A SIP OF WATER: metoprolol,nameda,avodart.  How to Manage Your Diabetes Before and After Surgery  Why is it important to control my blood sugar before and after surgery? . Improving blood sugar levels before and after surgery helps healing and can limit problems. . A way of improving blood sugar control is eating a healthy diet by: o  Eating less sugar and carbohydrates o  Increasing activity/exercise o  Talking with your doctor about reaching your blood sugar goals . High blood sugars (greater than 180 mg/dL) can raise your risk of infections and slow your recovery, so you will need to focus on controlling your diabetes during the weeks before surgery. . Make sure that the doctor who takes care of your diabetes knows about your planned surgery including the date and location.  How do I manage my blood sugar before surgery? . Check your blood sugar at least 4 times a day, starting 2 days before surgery, to make sure that the level is not too high or low. o Check your blood sugar the morning of your surgery when you wake up and every 2 hours until you get to the Short Stay unit. . If your blood sugar is less than 70 mg/dL, you will need to treat for low blood sugar: o Do not take insulin. o Treat a low blood sugar (less than 70 mg/dL) with  cup of clear juice (cranberry or apple), 4 glucose tablets, OR glucose gel. o Recheck blood sugar in 15 minutes after treatment (to make sure it is greater than 70 mg/dL). If your blood sugar is not greater than 70 mg/dL on recheck, call [phone] for further instructions. . Report your blood sugar to the short stay nurse when you get to Short Stay.  . If you are admitted to the hospital after surgery: o Your blood sugar will be checked by the staff and you will probably be given insulin after surgery (instead of oral diabetes medicines) to make sure you have good blood sugar levels. o The goal for blood sugar control after surgery is 80-180  mg/dL.   WHAT DO I DO ABOUT MY DIABETES MEDICATION?  [name] Do not take oral diabetes medicines (pills) the morning of surgery.  . THE DAY BEFORE SURGERY, take metformin and [place] as usual.  THE MORNING OF SURGERY, DO NOT TAKE ANY DIABETIC MEDICATIONS DAY OF YOUR SURGERY                               You may not have any metal on your body including hair pins and              piercings  Do not wear jewelry, lotions, powders or perfumes, deodorant             Men may shave face and neck.   Do not bring valuables to the hospital. [place].  Contacts, dentures or bridgework may not be worn into surgery.  Leave suitcase in the car. After surgery it may be brought to your room.     Patients discharged the day of surgery will not be allowed to drive home. IF YOU ARE HAVING SURGERY AND GOING HOME THE SAME DAY, YOU MUST HAVE AN ADULT TO DRIVE YOU HOME AND BE WITH YOU FOR 24 HOURS. YOU MAY GO HOME BY TAXI OR UBER OR ORTHERWISE, BUT AN ADULT MUST ACCOMPANY YOU HOME AND STAY WITH YOU FOR 24 HOURS.  Name and phone number of your driver:  Special Instructions: N/A              Please read over the following fact sheets you were given: _____________________________________________________________________          [facility] - Preparing for Surgery Before surgery, you can play an important role.  Because skin is not sterile, your skin needs to be as free of germs as possible.  You can reduce the number of germs on your skin by washing with CHG (chlorahexidine gluconate) soap before surgery.  CHG is an antiseptic cleaner which kills germs and bonds with the skin to continue killing germs even after washing. Please DO NOT use if you have an allergy to CHG or antibacterial soaps.  If your skin becomes reddened/irritated stop using the CHG and inform your nurse when you arrive at Short Stay. Do not shave (including legs and underarms) for at least 48  hours prior to the first CHG shower.  You may shave your face/neck. Please follow these instructions carefully:  1.  Shower with CHG Soap the night before surgery and the  morning of Surgery.  2.  If you choose to wash your hair, wash your hair first as usual with your  normal  shampoo.  3.  After you shampoo, rinse your hair and body thoroughly to remove the  shampoo.                           4.  Use CHG as you would any other liquid soap.  You can apply chg directly  to the skin and wash                       Gently with a scrungie or clean washcloth.  5.  Apply the CHG Soap to your body ONLY FROM THE NECK DOWN.   Do not use on face/ open                           Wound or open sores. Avoid contact with eyes, ears mouth and genitals (private  parts).                       Wash face,  Genitals (private parts) with your normal soap.             6.  Wash thoroughly, paying special attention to the area where your surgery  will be performed.  7.  Thoroughly rinse your body with warm water from the neck down.  8.  DO NOT shower/wash with your normal soap after using and rinsing off  the CHG Soap.                9.  Pat yourself dry with a clean towel.            10.  Wear clean pajamas.            11.  Place clean sheets on your bed the night of your first shower and do not  sleep with pets. Day of Surgery : Do not apply any lotions/deodorants the morning of surgery.  Please wear clean clothes to the hospital/surgery center.  FAILURE TO FOLLOW THESE INSTRUCTIONS MAY RESULT IN THE CANCELLATION OF YOUR SURGERY PATIENT SIGNATURE_________________________________  NURSE SIGNATURE__________________________________  ________________________________________________________________________

## 2020-11-20 ENCOUNTER — Encounter (HOSPITAL_COMMUNITY)
Admission: RE | Admit: 2020-11-20 | Discharge: 2020-11-20 | Disposition: A | Discharge status: Home / Self Care | Payer: Medicare Other | Source: Ambulatory Visit | Attending: Urology | Admitting: Urology

## 2020-11-20 ENCOUNTER — Other Ambulatory Visit: Payer: Self-pay

## 2020-11-20 ENCOUNTER — Encounter (HOSPITAL_COMMUNITY): Payer: Self-pay

## 2020-11-20 DIAGNOSIS — Z7984 Long term (current) use of oral hypoglycemic drugs: Secondary | ICD-10-CM | POA: Insufficient documentation

## 2020-11-20 DIAGNOSIS — G309 Alzheimer's disease, unspecified: Secondary | ICD-10-CM | POA: Diagnosis not present

## 2020-11-20 DIAGNOSIS — Z794 Long term (current) use of insulin: Secondary | ICD-10-CM | POA: Diagnosis not present

## 2020-11-20 DIAGNOSIS — E118 Type 2 diabetes mellitus with unspecified complications: Secondary | ICD-10-CM | POA: Diagnosis not present

## 2020-11-20 DIAGNOSIS — Z01818 Encounter for other preprocedural examination: Secondary | ICD-10-CM | POA: Insufficient documentation

## 2020-11-20 DIAGNOSIS — I10 Essential (primary) hypertension: Secondary | ICD-10-CM | POA: Insufficient documentation

## 2020-11-20 DIAGNOSIS — Z7901 Long term (current) use of anticoagulants: Secondary | ICD-10-CM | POA: Diagnosis not present

## 2020-11-20 DIAGNOSIS — N135 Crossing vessel and stricture of ureter without hydronephrosis: Secondary | ICD-10-CM | POA: Insufficient documentation

## 2020-11-20 DIAGNOSIS — E1165 Type 2 diabetes mellitus with hyperglycemia: Secondary | ICD-10-CM | POA: Diagnosis not present

## 2020-11-20 DIAGNOSIS — F0390 Unspecified dementia without behavioral disturbance: Secondary | ICD-10-CM | POA: Diagnosis not present

## 2020-11-20 DIAGNOSIS — E782 Mixed hyperlipidemia: Secondary | ICD-10-CM | POA: Diagnosis not present

## 2020-11-20 DIAGNOSIS — Z79899 Other long term (current) drug therapy: Secondary | ICD-10-CM | POA: Diagnosis not present

## 2020-11-20 DIAGNOSIS — D649 Anemia, unspecified: Secondary | ICD-10-CM | POA: Insufficient documentation

## 2020-11-20 DIAGNOSIS — R5383 Other fatigue: Secondary | ICD-10-CM | POA: Diagnosis not present

## 2020-11-20 HISTORY — DX: Paralytic syndrome, unspecified: G83.9

## 2020-11-20 LAB — BASIC METABOLIC PANEL
Anion gap: 8 (ref 5–15)
BUN: 33 mg/dL — ABNORMAL HIGH (ref 8–23)
CO2: 27 mmol/L (ref 22–32)
Calcium: 8.5 mg/dL — ABNORMAL LOW (ref 8.9–10.3)
Chloride: 99 mmol/L (ref 98–111)
Creatinine, Ser: 1.65 mg/dL — ABNORMAL HIGH (ref 0.61–1.24)
GFR, Estimated: 43 mL/min — ABNORMAL LOW (ref >60)
Glucose, Bld: 260 mg/dL — ABNORMAL HIGH (ref 70–99)
Potassium: 4.1 mmol/L (ref 3.5–5.1)
Sodium: 134 mmol/L — ABNORMAL LOW (ref 135–145)

## 2020-11-20 LAB — CBC
HCT: 30.2 % — ABNORMAL LOW (ref 39.0–52.0)
Hemoglobin: 9.8 g/dL — ABNORMAL LOW (ref 13.0–17.0)
MCH: 27.1 pg (ref 26.0–34.0)
MCHC: 32.5 g/dL (ref 30.0–36.0)
MCV: 83.7 fL (ref 80.0–100.0)
Platelets: 207 10*3/uL (ref 150–400)
RBC: 3.61 MIL/uL — ABNORMAL LOW (ref 4.22–5.81)
RDW: 13.7 % (ref 11.5–15.5)
WBC: 7.7 10*3/uL (ref 4.0–10.5)
nRBC: 0 % (ref 0.0–0.2)

## 2020-11-20 NOTE — Progress Notes (Signed)
Lab. Results: Hmg: 9.8

## 2020-11-20 NOTE — Progress Notes (Signed)
COVID Vaccine Completed: Yes Date COVID Vaccine completed: 11/19/20 [place] COVID vaccine manufacturer: [place]     PCP -  Cardiologist -   Chest x-ray -  EKG - 07/08/20 Stress Test -  ECHO -  Cardiac Cath -  Pacemaker/ICD device last checked:  Sleep Study -  CPAP -   Fasting Blood Sugar - N/A Checks Blood Sugar ___0__ times a day  Blood Thinner Instructions: Aspirin Instructions: Last Dose:  Anesthesia review: Hx: HTN,DIA  Patient denies shortness of breath, fever, cough and chest pain at PAT appointment   Patient verbalized understanding of instructions that were given to them at the PAT appointment. Patient was also instructed that they will need to review over the PAT instructions again at home before surgery.

## 2020-11-21 ENCOUNTER — Other Ambulatory Visit: Payer: Medicare Other | Admitting: Hospice

## 2020-11-21 DIAGNOSIS — R5383 Other fatigue: Secondary | ICD-10-CM | POA: Diagnosis not present

## 2020-11-21 DIAGNOSIS — F0390 Unspecified dementia without behavioral disturbance: Secondary | ICD-10-CM

## 2020-11-21 DIAGNOSIS — Z515 Encounter for palliative care: Secondary | ICD-10-CM | POA: Diagnosis not present

## 2020-11-21 LAB — HEMOGLOBIN A1C
Hgb A1c MFr Bld: 8.5 % — ABNORMAL HIGH (ref 4.8–5.6)
Mean Plasma Glucose: 197 mg/dL

## 2020-11-21 NOTE — Progress Notes (Signed)
[facility] Consult Note Telephone: [phone]  Fax: [phone]  PATIENT NAME: [name] [name] DOB: 1940-07-27 MRN: [id number]  PRIMARY CARE PROVIDER:   [name], [place], [place], [place],  [place] [address]  REFERRING PROVIDER: [name], [place], [place], [place],  [place] [address]  RESPONSIBLE PARTY: Spouse Contact Information     Name Relation Home Work [name] [name] Spouse [phone]  [phone]   [name] [name], [name] [name] [phone]  [phone]       Visit requested by spouse is to build trust and highlight Palliative Medicine as specialized medical care for people living with serious illness, aimed at facilitating better quality of life through symptoms relief, assisting with advance care planning and complex medical decision making.   RECOMMENDATIONS/PLAN:   Advance Care Planning/Code Status: Patient is a DO NOT RESUSCITATE  Goals of Care: Goals of care include to maximize quality of life and symptom management.  Visit consisted of counseling and education dealing with the complex and emotionally intense issues of symptom management and palliative care in the setting of serious and potentially life-threatening illness. Palliative care team will continue to support patient, patient's family, and medical team.  Symptom management/Plan:  Lethargy: Improved.  Pending results for UA/C&S.  Education provided on need to take antibiotics to completion, if indicated.  Check CBG daily and report hypoglycemic/hyper glycemic incidents. Dementia: Memory loss/confusion at baseline.  Continue ongoing supportive care. Type II DM: Last A1c January 2022 is 8.2.  Appointment with PCP tomorrow.  Repeat A1c.  Follow up: Palliative care will continue to follow for complex medical decision making, advance care planning, and clarification of  goals. Return 6 weeks or prn. Encouraged to call provider sooner with any concerns.  CHIEF COMPLAINT: Palliative follow up visit/lethargy  HISTORY OF PRESENT ILLNESS:  [name] [name] a 80 y.o. male with multiple medical problems including new onset lethargy, worse yesterday during the day impairing his ability to be alert and interacts.  He he was lethargic most of the day; sleeping and waking up this morning was helpful.  Today, patient is alert and interactive today, denies pain/discomfort; he is nontoxic.  Report of cloudy and smelly urine collected for UA and CNS.  Results pending.  History of bladder cancer, dementia, type 2 diabetes mellitus, hypertension, cancer of skin of the neck.  History obtained from review of EMR, discussion with primary team, family and/or patient. Records reviewed and summarized above. All 10 point systems reviewed and are negative except as documented in history of present illness above  Review and summarization of Epic records shows history from other than patient.   Palliative Care was asked to follow this patient o help address complex decision making in the context of advance care planning and goals of care clarification.   PPS: 50%  ROS General: NAD EYES: denies vision changes ENMT: denies dysphagia Cardiovascular: denies chest pain/discomfort Pulmonary: denies cough, denies SOB Abdomen: endorses good appetite, denies constipation/diarrhea GU: denies dysuria, urinary frequency MSK:  denies weakness,  no falls reported Skin: denies rashes or wounds Neurological: denies pain, denies insomnia Psych: Endorses positive mood Heme/lymph/immuno: denies bruises, abnormal bleeding  PHYSICAL EXAM  General: In no acute distress, appropriately dressed, alert, interactive Cardiovascular: regular rate and rhythm; no edema in BLE Pulmonary: no cough, no increased work of breathing, normal respiratory effort Abdomen: soft, non tender, no guarding, positive bowel  sounds in all quadrants GU:  no suprapubic tenderness Eyes: Normal lids, no discharge ENMT: Moist mucous membranes Musculoskeletal:  weakness Skin: no rash to visible skin, warm without cyanosis,  Psych: non-anxious affect Neurological: Weakness but otherwise non focal Heme/lymph/immuno: no bruises, no bleeding  PERTINENT MEDICATIONS:  Outpatient Encounter Medications as of 11/21/2020  Medication Sig   Cyanocobalamin (VITAMIN B-12 CR PO) Take 1 tablet by mouth every evening.   dutasteride (AVODART) 0.5 MG capsule Take 0.5 mg by mouth in the morning.   hydrochlorothiazide (HYDRODIURIL) 12.5 MG tablet Take 12.5 mg by mouth in the morning.   Insulin Pen Needle (PEN NEEDLES) 31G X 5 MM MISC See admin instructions.   memantine (NAMENDA XR) 28 MG CP24 24 hr capsule Take 28 mg by mouth in the morning.   metFORMIN (GLUCOPHAGE-XR) 750 MG 24 hr tablet Take 750 mg by mouth every evening.   metoprolol succinate (TOPROL-XL) 25 MG 24 hr tablet Take 25 mg by mouth in the morning.   Multiple Vitamin (MULTIVITAMIN WITH MINERALS) TABS tablet Take 1 tablet by mouth every evening.   ONETOUCH DELICA LANCETS 07P MISC 2 (two) times daily. as directed   perindopril (ACEON) 4 MG tablet Take 4 mg by mouth in the morning.   sitaGLIPtin (JANUVIA) 100 MG tablet Take 100 mg by mouth in the morning.   No facility-administered encounter medications on file as of 11/21/2020.    HOSPICE ELIGIBILITY/DIAGNOSIS: TBD  PAST MEDICAL HISTORY:  Past Medical History:  Diagnosis Date   Alzheimer disease ([place])    Alzheimer's disease ([place])    Anemia    Bilateral cold feet    Bladder cancer ([place])    dx in 2013.07/08/19 skin CA in neck   BPH (benign prostatic hypertrophy)    Dementia ([place]) 12-11-11   memory short term(steadily progressive worsening-pt. unaware) .dx. 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   moderate left hemidiaphragm elevation   Hard of hearing     Hearing loss    has one hearing aid, doesn't wear   History of chemotherapy    History of gallstones 2005   History of kidney stones    Hx of nonmelanoma skin cancer    Hypercholesterolemia    Hypertension 12-11-11   controlled with meds   Increased frequency of urination    wife reports urinary frequency has not improved since last surgery    Paralysis [facility])    Personal history of radiation exposure    Pulmonary nodules    Bilateral   Pyelonephritis    hospital admission july 2020   Renal calculus, left 10/12/2013   Urinary urgency    UTI (urinary tract infection) 08-09-13   multiple, last tx. 1 week ago     Review lab tests/diagnostics Recent Labs  Lab 11/20/20 1310  WBC 7.7  HGB 9.8*  HCT 30.2*  PLT 207  MCV 83.7   Recent Labs  Lab 11/20/20 1310  NA 134*  K 4.1  CL 99  CO2 27  BUN 33*  CREATININE 1.65*  GLUCOSE 260*    ALLERGIES:  Allergies  Allergen Reactions   Sulfa Antibiotics Hives   Tetracyclines & Related Rash   Aricept [Donepezil Hcl] Nausea And Vomiting       Thank you for the opportunity to participate in the care of [name] [name] Please call our office at [phone] if we can be of additional assistance.  Note: Portions of this note were generated with Dragon dictation  software. Dictation errors may occur despite best attempts at proofreading.  Teodoro Spray, NP

## 2020-11-21 NOTE — Anesthesia Preprocedure Evaluation (Addendum)
Anesthesia Evaluation  Patient identified by MRN, date of birth, ID bandGeneral Assessment Comment:Patient non-verbal  Reviewed: Allergy & Precautions, NPO status , Patient's Chart, lab work & pertinent test results  Airway Mallampati: III  TM Distance: >3 FB Neck ROM: Full    Dental no notable dental hx.    Pulmonary neg pulmonary ROS,    Pulmonary exam normal breath sounds clear to auscultation       Cardiovascular hypertension, Pt. on medications and Pt. on home beta blockers Normal cardiovascular exam Rhythm:Regular Rate:Normal  ECG: NSR, rate 61. LAD   Neuro/Psych PSYCHIATRIC DISORDERS Dementia Alzheimersnegative neurological ROS     GI/Hepatic negative GI ROS, Neg liver ROS,   Endo/Other  diabetes  Renal/GU Renal InsufficiencyRenal disease     Musculoskeletal negative musculoskeletal ROS (+)   Abdominal   Peds  Hematology  (+) anemia , Hypercholesterolemia   Anesthesia Other Findings RIGHT URETERAL OBSTRUCTION  Reproductive/Obstetrics                            Anesthesia Physical Anesthesia Plan  ASA: 3  Anesthesia Plan: General   Post-op Pain Management:    Induction: Intravenous  PONV Risk Score and Plan: 3 and Ondansetron, Dexamethasone and Treatment may vary due to age or medical condition  Airway Management Planned: LMA  Additional Equipment:   Intra-op Plan:   Post-operative Plan: Extubation in OR  Informed Consent: I have reviewed the patients History and Physical, chart, labs and discussed the procedure including the risks, benefits and alternatives for the proposed anesthesia with the patient or authorized representative who has indicated his/her understanding and acceptance.     Dental advisory given and Consent reviewed with POA  Plan Discussed with: CRNA  Anesthesia Plan Comments: (Reviewed APP note by Durel Salts, FNP )       Anesthesia Quick  Evaluation

## 2020-11-21 NOTE — Progress Notes (Deleted)
[facility] Consult Note Telephone: [phone]  Fax: [phone]  PATIENT NAME: [name] [name] [address] [place] [address] [phone] (home)  DOB: October 10, 1940 MRN: [id number]  PRIMARY CARE PROVIDER:    [name], MD,  [place] [address] [phone]  REFERRING PROVIDER:   [name], [place] [address] [place],  [place] [address] [phone]  RESPONSIBLE PARTY:    Contact Information     Name Relation Home Work [place] Spouse [phone]  [phone]   Sabre, Leonetti [phone]  [phone]        I met face to face with patient and family at home or ***facility. Palliative Care was asked to follow this patient by consultation request of  [name], MD to address advance care planning, complex medical decision making and goals of care clarification. This is the initial visit.    ASSESSMENT AND / RECOMMENDATIONS:   Advance Care Planning: Our advance care planning conversation included a discussion about:    The value and importance of advance care planning  Difference between Hospice and Palliative care Exploration of goals of care in the event of a sudden injury or illness  Identification and preparation of a healthcare agent  Review and updating or creation of an  advance directive document . Decision not to resuscitate or to de-escalate disease focused treatments due to poor prognosis.  CODE STATUS:  Goals of Care: Goals include to maximize quality of life and symptom management  I spent   minutes providing this initial consultation. More than 50% of the time in this consultation was spent on counseling patient and coordinating communication. --------------------------------------------------------------------------------------------------------------------------------------  Symptom Management/Plan: Cloudy  smelly urine. Awaiting the result  Follow up: Palliative care will continue to follow for complex medical decision making, advance care planning, and clarification of goals. Return 6 weeks or prn.Encouraged to call provider sooner with any concerns.   Family /Caregiver/Community Supports:   PPS: ***0%  HOSPICE ELIGIBILITY/DIAGNOSIS: TBD  Chief Complaint: Initial Palliative care visit:***  HISTORY OF PRESENT ILLNESS:  [name] [name] is a 80 y.o. year old male  with multiple medical conditions including  acute lethargy with associated poor appetite, poor oral intake, fall. Patient urine was cloudy and smelly, awaiting result, to see PCP tomorrow at 10.30am *** .   History obtained from review of EMR, discussion with primary team, caregiver, family and/or [name] [name].  Review and summarization of Epic records shows history from other than patient. Rest of 10 point ROS asked and negative.     Review of lab tests/diagnostics   Recent Labs  Lab 11/20/20 1310  WBC 7.7  HGB 9.8*  HCT 30.2*  PLT 207  MCV 83.7   Recent Labs  Lab 11/20/20 1310  NA 134*  K 4.1  CL 99  CO2 27  BUN 33*  CREATININE 1.65*  GLUCOSE 260*  Results for [name], [name]" (MRN [id number]) as of 11/21/2020 10:44  Ref. Range 11/20/2020 13:10  Sodium Latest Ref Range: 135 - 145 mmol/L 134 (L)  Potassium Latest Ref Range: 3.5 - 5.1 mmol/L 4.1  Chloride Latest Ref Range: 98 - 111 mmol/L 99  CO2 Latest Ref Range: 22 - 32 mmol/L 27  Glucose Latest Ref Range: 70 - 99 mg/dL 260 (H)  Mean Plasma Glucose Latest Units: mg/dL 197  BUN Latest Ref Range: 8 - 23 mg/dL 33 (H)  Creatinine Latest Ref Range: 0.61 - 1.24  mg/dL 1.65 (H)  Calcium Latest Ref Range: 8.9 - 10.3 mg/dL 8.5 (L)  Anion gap Latest Ref Range: 5 - 15  8  GFR, Estimated Latest Ref Range: >60 mL/min 43 (L)  WBC Latest Ref Range: 4.0 - 10.5 K/uL 7.7  RBC Latest Ref Range: 4.22 - 5.81 MIL/uL 3.61 (L)  Hemoglobin Latest Ref Range: 13.0 - 17.0 g/dL  9.8 (L)  HCT Latest Ref Range: 39.0 - 52.0 % 30.2 (L)  MCV Latest Ref Range: 80.0 - 100.0 fL 83.7  MCH Latest Ref Range: 26.0 - 34.0 pg 27.1  MCHC Latest Ref Range: 30.0 - 36.0 g/dL 32.5  RDW Latest Ref Range: 11.5 - 15.5 % 13.7  Platelets Latest Ref Range: 150 - 400 K/uL 207  nRBC Latest Ref Range: 0.0 - 0.2 % 0.0  Hemoglobin A1C Latest Ref Range: 4.8 - 5.6 % 8.5 (H)   Latest GFR by Cockcroft Gault (not valid in AKI or ESRD) CrCl cannot be calculated (Unknown ideal weight.). No results for input(s): AST, ALT, ALKPHOS, GGT in the last 168 hours.  Invalid input(s): TBILI, CONJBILI, ALB, TOTALPROTEIN No components found for: ALB No results for input(s): APTT, INR in the last 168 hours.  Invalid input(s): PTPATIENT No results for input(s): BNP, PROBNP in the last 168 hours.  ROS*** General: NAD EYES: denies vision changes ENMT: denies dysphagia Cardiovascular: denies chest pain/discomfort Pulmonary: denies cough, denies SOB Abdomen: endorses good appetite, denies constipation/diarrhea GU: denies dysuria, urinary frequency MSK:  denies weakness,  no falls reported Skin: denies rashes or wounds Neurological: denies pain, denies insomnia Psych: Endorses positive mood Heme/lymph/immuno: denies bruises, abnormal bleeding  Physical Exam: BP120/60 02 93% RA P81 R18 Height/Weight Constitutional: NAD General: Well groomed, cooperative EYES: anicteric sclera, lids intact, no discharge  ENMT: Moist mucous membrane CV: S1 S2, RRR, no LE edema Pulmonary: LCTA, no increased work of breathing, no cough, Abdomen: active BS + 4 quadrants, soft and non tender GU: no suprapubic tenderness MSK: weakness, sarcopenia, limited ROM Skin: warm and dry, no rashes or wounds on visible skin Neuro:  weakness, otherwise non focal Psych: non-anxious affect Hem/lymph/immuno: no widespread bruising   PAST MEDICAL HISTORY:  Active Ambulatory Problems    Diagnosis Date Noted   Memory loss  03/01/2013   Essential hypertension 03/01/2013   Type 2 diabetes mellitus without complication ([facility]) 40/97/3532   Renal calculus, left 10/12/2013   Cancer of lateral wall of urinary bladder ([facility]) 03/10/2016   Severe sepsis ([facility]) 01/06/2019   Acute lower UTI 01/06/2019   Dementia without behavioral disturbance ([facility]) 01/06/2019   Sepsis ([facility]) 01/06/2019   History of subdural hematoma 01/06/2019   Cancer of skin of neck 08/02/2019   Dehydration 12/28/2019   DNR (do not resuscitate) 12/28/2019   Diabetic retinopathy associated with type 2 diabetes mellitus ([facility]) 07/12/2020   Iron deficiency anemia 07/12/2020   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 ([facility]) 05/17/2019   Mixed hyperlipidemia 07/12/2020   Encounter for general adult medical examination without abnormal findings 07/12/2020   Resolved Ambulatory Problems    Diagnosis Date Noted   No Resolved Ambulatory Problems   Past Medical History:  Diagnosis Date   Alzheimer disease ([facility])    Alzheimer's disease ([facility])    Anemia    Bilateral cold feet    Bladder cancer ([facility])    BPH (benign prostatic hypertrophy)    Dementia ([facility]) 12-11-11   Diabetes mellitus 12-11-11   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   Hard of hearing  Hearing loss    History of chemotherapy    History of gallstones 2005   History of kidney stones    Hx of nonmelanoma skin cancer    Hypercholesterolemia    Hypertension 12-11-11   Increased frequency of urination    Paralysis ([facility])    Personal history of radiation exposure    Pulmonary nodules    Pyelonephritis    Urinary urgency    UTI (urinary tract infection) 08-09-13    SOCIAL HX:  Social History   Tobacco Use   Smoking status: Never   Smokeless tobacco: Never  Substance Use Topics   Alcohol use: No     FAMILY HX:  Family History  Problem Relation Age of Onset   Breast cancer Sister    Colon cancer Brother 28      ALLERGIES:  Allergies  Allergen Reactions   Sulfa Antibiotics  Hives   Tetracyclines & Related Rash   Aricept [Donepezil Hcl] Nausea And Vomiting      PERTINENT MEDICATIONS:  Outpatient Encounter Medications as of 11/21/2020  Medication Sig   Cyanocobalamin (VITAMIN B-12 CR PO) Take 1 tablet by mouth every evening.   dutasteride (AVODART) 0.5 MG capsule Take 0.5 mg by mouth in the morning.   hydrochlorothiazide (HYDRODIURIL) 12.5 MG tablet Take 12.5 mg by mouth in the morning.   Insulin Pen Needle (PEN NEEDLES) 31G X 5 MM MISC See admin instructions.   memantine (NAMENDA XR) 28 MG CP24 24 hr capsule Take 28 mg by mouth in the morning.   metFORMIN (GLUCOPHAGE-XR) 750 MG 24 hr tablet Take 750 mg by mouth every evening.   metoprolol succinate (TOPROL-XL) 25 MG 24 hr tablet Take 25 mg by mouth in the morning.   Multiple Vitamin (MULTIVITAMIN WITH MINERALS) TABS tablet Take 1 tablet by mouth every evening.   ONETOUCH DELICA LANCETS 26W MISC 2 (two) times daily. as directed   perindopril (ACEON) 4 MG tablet Take 4 mg by mouth in the morning.   sitaGLIPtin (JANUVIA) 100 MG tablet Take 100 mg by mouth in the morning.   No facility-administered encounter medications on file as of 11/21/2020.     Thank you for the opportunity to participate in the care of Mr. Bradt.  The palliative care team will continue to follow. Please call our office at [phone] if we can be of additional assistance.   Note: Portions of this note were generated with [employer]. Dictation errors may occur despite best attempts at proofreading.  Teodoro Spray, NP

## 2020-11-21 NOTE — Progress Notes (Signed)
Anesthesia Chart Review:   Case: [id number] Date/Time: 12/02/20 1100   Procedure: CYSTOSCOPY WITH STENT CHANGE (Right)   Anesthesia type: General   Pre-op diagnosis: RIGHT URETERAL OBSTRUCTION   [facility]   Surgeons: Raynelle Bring, MD       DISCUSSION: Pt is 80 years old with hx HTN, DM, dementia, pulmonary nodules, anemia, bladder cancer, infra-auricular squamous cell carcinoma (s/p parotidectomy, neck dissection and skin flap 2020; s/p neck and parotid gland radiation therapy 2021)  VS: BP 126/66   Pulse 93   Temp 37.6 C (Oral)   Ht 5\' 8"  (1.727 m)   BMI 24.02 kg/m   PROVIDERS: - PCP is [name], MD - Oncologist is Zola Button, MD. Last office visit 02/06/20 discusses transition to palliative care   LABS:  - HbA1c 8.5 - Glucose 260 - Cr 1.65/BUN 33. This is higher than usual baseline ~1.1; may be due to ongoing ureteral obstruction. Will route results to Dr. Alinda Money - Hgb 9.8. This is consistent with recent prior results  (all labs ordered are listed, but only abnormal results are displayed)  Labs Reviewed  BASIC METABOLIC PANEL - Abnormal; Notable for the following components:      Result Value   Sodium 134 (*)    Glucose, Bld 260 (*)    BUN 33 (*)    Creatinine, Ser 1.65 (*)    Calcium 8.5 (*)    GFR, Estimated 43 (*)    All other components within normal limits  CBC - Abnormal; Notable for the following components:   RBC 3.61 (*)    Hemoglobin 9.8 (*)    HCT 30.2 (*)    All other components within normal limits  HEMOGLOBIN A1C - Abnormal; Notable for the following components:   Hgb A1c MFr Bld 8.5 (*)    All other components within normal limits     IMAGES: CT chest/abd/pelvis 01/30/20:  1. Filling defect within the RIGHT renal pelvis on delayed imaging is favored represent debris or clot. Malignancy is less favored. 2. Small filling defect within the bladder adjacent to the RIGHT vesicoureteral junction is indeterminate.  This could also represent clot or debris. 3. No enhancing renal cortical lesion. No enhancing bladder lesion identified 4. Mild RIGHT pelvicaliectasis.  No hydroureter. 5. LEFT kidney normal. 6. No evidence of metastatic disease on chest abdomen pelvis CT.   EKG 07/08/20: NSR. Incomplete LBBB   CV: Echo 08/18/12 - Left ventricle: The cavity size was normal. Wall thickness was increased in a pattern of mild LVH. Systolic function was normal. The estimated ejection fraction was in the range of 55% to 65%. Wall motion was normal; there were no regional wall motion abnormalities. Doppler parameters are consistent with abnormal left ventricular relaxation (grade 1 diastolic dysfunction).  - Atrial septum: No defect or patent foramen ovale was identified.   Past Medical History:  Diagnosis Date   Alzheimer disease ([facility])    Alzheimer's disease ([facility])    Anemia    Bilateral cold feet    Bladder cancer ([facility])    dx in 2013.07/08/19 skin CA in neck   BPH (benign prostatic hypertrophy)    Dementia ([facility]) 12-11-11   memory short term(steadily progressive worsening-pt. unaware) .dx. 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   moderate left hemidiaphragm elevation   Hard of hearing    Hearing loss    has one hearing aid, doesn't  wear   History of chemotherapy    History of gallstones 2005   History of kidney stones    Hx of nonmelanoma skin cancer    Hypercholesterolemia    Hypertension 12-11-11   controlled with meds   Increased frequency of urination    wife reports urinary frequency has not improved since last surgery    Paralysis [facility])    Personal history of radiation exposure    Pulmonary nodules    Bilateral   Pyelonephritis    hospital admission july 2020   Renal calculus, left 10/12/2013   Urinary urgency    UTI (urinary tract infection) 08-09-13   multiple, last tx. 1 week ago    Past Surgical History:  Procedure  Laterality Date   BLADDER SURGERY  12-11-11   2007-tumor removal -stent placed and removed./stent  '08 for stone   CATARACT EXTRACTION, BILATERAL     bil.  LEFT EYE 08-20-09    RT EYE 08-27-09   COLONOSCOPY     CYSTOSCOPY  05-16-2007   cystoscopy  10-09-2008   CYSTOSCOPY N/A 11/18/2015   Procedure: CYSTOSCOPY;  Surgeon: Raynelle Bring, MD;  [facility];  Service: Urology;  Laterality: N/A;   CYSTOSCOPY N/A 10/19/2016   Procedure: CYSTOSCOPY;  Surgeon: Raynelle Bring, MD;  [facility];  Service: Urology;  Laterality: N/A;   CYSTOSCOPY N/A 11/11/2017   Procedure: BLUE LIGHT CYSTOSCOPY WITH CYSVIEW;  Surgeon: Raynelle Bring, MD;  [facility];  Service: Urology;  Laterality: N/A;   CYSTOSCOPY W/ RETROGRADES  12/21/2011   Procedure: CYSTOSCOPY WITH RETROGRADE PYELOGRAM;  Surgeon: [name], MD;  [facility];  Service: Urology;  Laterality: Bilateral;   CYSTOSCOPY W/ RETROGRADES Bilateral 08/14/2013   Procedure: CYSTOSCOPY WITH BILATERAL  RETROGRADE PYELOGRAM/LEFT DIGITAL FLEXIBLE URETEROSCOPY/INSERTION LEFT URETERAL STENT;  Surgeon: [name], MD;  [facility];  Service: Urology;  Laterality: Bilateral;   CYSTOSCOPY W/ RETROGRADES Bilateral 07/23/2014   Procedure: CYSTOSCOPY WITH RETROGRADE PYELOGRAM;  Surgeon: Raynelle Bring, MD;  [facility];  Service: Urology;  Laterality: Bilateral;   CYSTOSCOPY W/ RETROGRADES Bilateral 05/13/2015   Procedure: CYSTOSCOPY WITH RETROGRADE PYELOGRAM;  Surgeon: Raynelle Bring, MD;  [facility];  Service: Urology;  Laterality: Bilateral;   CYSTOSCOPY W/ RETROGRADES Bilateral 03/02/2016   Procedure: Rolly Salter UNDER ANESTHESIA;  Surgeon: Raynelle Bring, MD;  [facility];  Service: Urology;  Laterality: Bilateral;   CYSTOSCOPY W/ RETROGRADES N/A 09/28/2016   Procedure: CYSTOSCOPY WITH RETROGRADE PYELOGRAM;  Surgeon: Raynelle Bring, MD;  [facility];  Service: Urology;  Laterality: N/A;   CYSTOSCOPY W/ URETERAL STENT PLACEMENT Right  05/08/2019   Procedure: CYSTOSCOPY WITH RIGHT STENT CHANGE;  Surgeon: Raynelle Bring, MD;  [facility];  Service: Urology;  Laterality: Right;   CYSTOSCOPY WITH BIOPSY  12/21/2011   Procedure: CYSTOSCOPY WITH BIOPSY;  Surgeon: [name], MD;  [facility];  Service: Urology;;   CYSTOSCOPY WITH BIOPSY N/A 08/29/2018   Procedure: CYSTOSCOPY WITH BIOPSY OF BLADDER TUMOR/ TRANSURETHRAL RESECTION OF BLADDER TUMOR/ RIGHT RETROGRADE PYELOGRAM/RIGHT STENT PLACEMENT;  Surgeon: Raynelle Bring, MD;  [facility];  Service: Urology;  Laterality: N/A;   CYSTOSCOPY WITH STENT PLACEMENT Right 01/26/2019   Procedure: CYSTOSCOPY WITH STENT EXCHANGE;  Surgeon: Raynelle Bring, MD;  [facility];  Service: Urology;  Laterality: Right;   CYSTOSCOPY WITH STENT PLACEMENT Right 08/07/2019   Procedure: CYSTOSCOPY WITH STENT EXCHANGE;  Surgeon: Raynelle Bring, MD;  [facility];  Service: Urology;  Laterality: Right;  CYSTOSCOPY WITH STENT PLACEMENT Right 10/30/2019   Procedure: CYSTOSCOPY WITH STENT CHANGE;  Surgeon: Raynelle Bring, MD;  [facility];  Service: Urology;  Laterality: Right;   CYSTOSCOPY WITH STENT PLACEMENT Right 03/11/2020   Procedure: CYSTOSCOPY WITH STENT CHANGE, Right Ureteroscopy;  Surgeon: Raynelle Bring, MD;  [facility];  Service: Urology;  Laterality: Right;   CYSTOSCOPY WITH STENT PLACEMENT Right 07/11/2020   Procedure: CYSTOSCOPY WITH STENT CHANGE;  Surgeon: Raynelle Bring, MD;  [facility];  Service: Urology;  Laterality: Right;   EXTRACORPOREAL SHOCK WAVE LITHOTRIPSY  2005   EYE SURGERY     LITHOTRIPSY  12-11-11   '05/ '10   MOHS SURGERY     several    mose procedure  Left 11-12-15 and feb 2017   NECK DISSECTION Left 06/12/2019   Procedure: neck dissection; Surgeon: Theodoro Kalata, MD; Location[facility] MA[facility] OR; Service: ENT; Laterality: N/A;     NEPHROLITHOTOMY Left 10/12/2013   Procedure: NEPHROLITHOTOMY PERCUTANEOUS;  Surgeon: [name], MD;  [facility];  Service: Urology;  Laterality: Left;   PAROTIDECTOMY Left 06/12/2019   Procedure: PAROTIDECTOMY; Surgeon: Theodoro Kalata, MD; [facility]; Service: ENT; Laterality: Left;     removed cancer from left side of neck at parotid     stent to kidney  04-29-2007   SUBDURAL HEMORRHAGE DRAINAGE     drainage per boreholes only 12-12-03   TONSILLECTOMY  12-11-11   child   TRANSURETHRAL RESECTION OF BLADDER TUMOR  12/21/2011   Procedure: TRANSURETHRAL RESECTION OF BLADDER TUMOR (TURBT);  Surgeon: [name], MD;  [facility];  Service: Urology;  Laterality: N/A;      TRANSURETHRAL RESECTION OF BLADDER TUMOR N/A 08/14/2013   Procedure: TRANSURETHRAL RESECTION OF BLADDER TUMOR (TURBT);  Surgeon: [name], MD;  [facility];  Service: Urology;  Laterality: N/A;   TRANSURETHRAL RESECTION OF BLADDER TUMOR N/A 07/23/2014   Procedure: TRANSURETHRAL RESECTION OF BLADDER TUMOR (TURBT) WITH MITOMYCIN INSTILLATION;  Surgeon: Raynelle Bring, MD;  [facility];  Service: Urology;  Laterality: N/A;   TRANSURETHRAL RESECTION OF BLADDER TUMOR N/A 05/13/2015   Procedure: TRANSURETHRAL RESECTION OF BLADDER TUMOR (TURBT);  Surgeon: Raynelle Bring, MD;  [facility];  Service: Urology;  Laterality: N/A;   TRANSURETHRAL RESECTION OF BLADDER TUMOR N/A 11/18/2015   Procedure: TRANSURETHRAL RESECTION OF BLADDER TUMOR (TURBT);  Surgeon: Raynelle Bring, MD;  [facility];  Service: Urology;  Laterality: N/A;   TRANSURETHRAL RESECTION OF BLADDER TUMOR N/A 03/02/2016   Procedure: TRANSURETHRAL RESECTION OF BLADDER TUMOR (TURBT);  Surgeon: Raynelle Bring, MD;  [facility];  Service: Urology;  Laterality: N/A;   TRANSURETHRAL RESECTION OF BLADDER TUMOR N/A 09/28/2016   Procedure: TRANSURETHRAL RESECTION OF BLADDER TUMOR (TURBT);  Surgeon: Raynelle Bring, MD;  [facility];  Service: Urology;  Laterality: N/A;   TRANSURETHRAL RESECTION OF BLADDER TUMOR N/A 10/19/2016   Procedure: TRANSURETHRAL  RESECTION OF BLADDER TUMOR (TURBT);  Surgeon: Raynelle Bring, MD;  [facility];  Service: Urology;  Laterality: N/A;  NEEDS 30 MIN TOTAL FOR BOTH PROCEDURES   TRANSURETHRAL RESECTION OF BLADDER TUMOR N/A 11/11/2017   Procedure: TRANSURETHRAL RESECTION OF BLADDER TUMOR (TURBT) WITH CYSTOSCOPY;  Surgeon: Raynelle Bring, MD;  [facility];  Service: Urology;  Laterality: N/A;   TRANSURETHRAL RESECTION OF BLADDER TUMOR N/A 10/17/2018   Procedure: TRANSURETHRAL RESECTION OF BLADDER TUMOR (TURBT) WITH CYSTOSCOPY, RIGHT URETEROSCOPY, RIGHT URETERAL STENT CHANGE;  Surgeon: Raynelle Bring, MD;  [facility];  Service: Urology;  Laterality: N/A;  GENERAL ANESTHESIA WITH POSSIBLE PARALYSIS   UPPER GI ENDOSCOPY      MEDICATIONS:  Cyanocobalamin (VITAMIN B-12 CR PO)   dutasteride (AVODART) 0.5 MG capsule   hydrochlorothiazide (HYDRODIURIL) 12.5 MG tablet   Insulin Pen Needle (PEN NEEDLES) 31G X 5 MM MISC   memantine (NAMENDA XR) 28 MG CP24 24 hr capsule   metFORMIN (GLUCOPHAGE-XR) 750 MG 24 hr tablet   metoprolol succinate (TOPROL-XL) 25 MG 24 hr tablet   Multiple Vitamin (MULTIVITAMIN WITH MINERALS) TABS tablet   ONETOUCH DELICA LANCETS 38U MISC   perindopril (ACEON) 4 MG tablet   sitaGLIPtin (JANUVIA) 100 MG tablet   No current facility-administered medications for this encounter.    If no changes, I anticipate pt can proceed with surgery as scheduled.   Willeen Cass, PhD, FNP-BC [facility] Short Stay Surgical Center/Anesthesiology Phone: [phone] 11/21/2020 4:00 PM

## 2020-11-21 NOTE — Progress Notes (Signed)
Lab results: A1C: 8.5 

## 2020-11-22 DIAGNOSIS — R5383 Other fatigue: Secondary | ICD-10-CM | POA: Diagnosis not present

## 2020-11-29 NOTE — H&P (Signed)
1. Bladder cancer  2. Right ureteral obstruction  3. Chronic bacteriuria   [name] [name] returns today for continued management of his right ureteral obstruction secondary to bladder cancer and radiation therapy to the bladder. At his last visit, he was likely going to proceed with hospice treatment due to significant decline in his functional status. However, his wife states that he has had 2 very good months. He is only had to visit the Dr. once each month. He denies any recent hematuria. He has not had any problems of urinary tract infections. He has no recurrent skin cancers.     ALLERGIES: Aricept Nitrofurantoin Monohyd Macro CAPS Sulfa Drugs Tetracyclines    MEDICATIONS: Metoprolol Succinate 25 mg tablet, extended release 24 hr  Avodart 0.5 mg capsule 0 Oral  Cefdinir  Fish Oil CAPS Oral  Glipizide  Hydrochlorothiazide 25 mg tablet Oral  Januvia 100 mg tablet  Metformin Hcl Er 750 mg tablet, extended release 24 hr  Namenda Xr 28 mg capsule sprinkle, extended release 24 hr Oral  Perindopril Erbumine 4 MG Oral Tablet Oral  Vitamin B-12 100 mcg tablet Oral     GU PSH: Bladder Instill AntiCA Agent - 2016 Catheterize For Residual - 2019 Cysto Bladder Ureth Biopsy - 2018 Cysto Uretero Lithotripsy - 2010 Cystoscopy - 2020, 2019, 2019, 2018, 2018, 2018 Cystoscopy Fulguration - 2010 Cystoscopy Insert Stent, Right - 07/11/2020, Right - 03/11/2020, Right - 10/30/2019, Right - 08/07/2019, Right - 05/08/2019, Right - 01/26/2019, Right - 10/17/2018, Right - 2020, 2015, 2010, 2010, 2008 Cystoscopy TURBT <2 cm - 2020, 2019, 2018, 2017, 2015, 2013 Cystoscopy TURBT 2-5 cm - 10/17/2018, 2017, 2016, 2016 Cystoscopy Ureteroscopy, Right - 03/11/2020, Right - 10/17/2018, Right - 2020, Left - 2018, 2015 Locm 300-399Mg /Ml Iodine,1Ml - 2020 Percut Stone Removal >2cm - 2015 Ureteroscopic stone removal - 2008       PSH Notes: Cystoscopy With Fulguration Medium Lesion (2-5cm), Cystoscopy With Fulguration Medium  Lesion (2-5cm), Bladder Injection Of Cancer Treatment, Percutaneous Lithotomy For Stone Over 2cm., Cystoscopy With Insertion Of Ureteral Stent Left, Cystoscopy With Ureteroscopy Left, Cystoscopy With Fulguration Small Lesion (5-64mm), Cystoscopy With Fulguration Small Lesion (5-5mm), Cystoscopy With Pyeloscopy With Lithotripsy, Cystoscopy With Insertion Of Ureteral Stent Right, Cystoscopy With Fulguration, Cystoscopy With Insertion Of Ureteral Stent Right, Cystoscopy With Ureteroscopy With Removal Of Calculus, Cystoscopy With Insertion Of Ureteral Stent Left   NON-GU PSH: No Non-GU PSH    GU PMH: History of bladder cancer - 05/21/2020, - 02/20/2020, - 01/02/2020 Ureteral obstruction - 05/21/2020, - 02/20/2020, - 2020 Acute Cystitis/UTI - 01/02/2020 Urinary Urgency (Stable) - 07/18/2019, Urinary urgency, - 2016 Urinary Frequency - 2020 Bladder Cancer overlapping sites - 2017, Malignant neoplasm of overlapping sites of bladder, - 2017 BPH w/LUTS, Benign prostatic hyperplasia (BPH) with straining on urination - 2016 Renal calculus, Nephrolithiasis - 2016      PMH Notes:   [name] [name] is a patient previously followed by Dr. Tresa Endo. 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 He was noted to have a recurrence in 2013 and underwent TURBT in July 2013. He was also incidentally noted to have a stricture of the distal left ureter on RPG studies at that time and a brush biopsy was performed. He was found to have high grade Ta urothelial carcinoma in February 2016.    Oct 2007: TURBT, Low grade Ta  Jul 2013: TURBT (right bladder neck 1.5 cm tumor, low grade Ta),brush biopsy of distal left ureteral stricture (no malignancy)  Mar 2015: TURBT - Low grade Ta  Feb 2016: TURBT - High grade Ta, [facility]  Mar-Apr 2016: Induction BCG  Jul 2016: Maintenance BCG  Oct 2016: Multiple  recurrent tumors noted on surveillance cystoscopy  Dec 2016: TUR - Multiple tumors over right posterior bladder - High grade, Ta  Dec 2016 - Feb 2017: 6 week induction BCG  May 2017: TURBT - High grade, Ta urothelial carcinoma (two tumors)  Oct 2017: Completed induction 6 week course of intravesical gemcitabine [facility] - Dr. Ricky Ala)  Apr 2018: Two small recurrences (each < 1 cm)  Apr 2018: TURBT- High grade Ta, concern for possible lamina propria invasion for tumor at dome  May 2018: Repeat TURBT - No residual malignancy  Jul-Aug 2018: Repeat induction 6 week course of intravesical gemcitabine [facility]- Dr. Ricky Ala)  Sep 2018 - Apr 2019: Monthly maintenance gemcitabine  May 2019: Blue light cystoscopy with TURBT for 2 recurrent tumors posteriorly - High grade, Ta  Jun-Jul 2019: Repeat induction intravesical gemcitabine  Mar 2020: TURBT for two recurrent tumors - High grade, muscle invasive urothelial carcinoma with invasion into distal right ureter, right ureteral stent  Apr 2020: Patient and wife elected trimodality therapy (refused cystectomy due to severe dementia), repeat completion TURBT and right ureteral stent change  May-Jun 2020: XRT and carboplatin    2) Nephrolithiasis: He has a long standing history of calcium oxalate stones. He was incidentally noted to have a large partial staghorn calculus during retrograde pyelography in February 2015.  Apr 2015: L PCNL   3) Prostate cancer screening: We discontinued screening in 2014 due to his low risk for prostate cancer at that time and progressive dementia.  Last PSA: 0.35 (June 2014)   4) BPH/LUTS: He is treated with Avodart.   5) Right ureteral obstruction: This was noted in March 2020 when he developed hydronephrosis due to progression of bladder cancer and development of muscle invasive disease. He began management with right ureteral stent in March 2020:   Mar 2020: R ureteral stent placement (encrusted within 6 weeks)  Apr 2020: R  ureteral stent change (6 x 26 [employer])  Aug 2020: R ureteral stent change (6 x 26 [employer])  Nov 2020: R ureteral stent change (6 x 26 [employer])  Feb 2021: R ureteral stent change (6 x 26 [employer])  May 2021: R ureteral stent change (6 x 26 [employer]) - only mild encrustation  Sep 2021: R ureteral stent change (8 x 26) - fungal infection on stent and possible early fungal ball formation  Jan 2022: R ureteral stent change (8 x 26) - purulent material but no enrustation   NON-GU PMH: Bacteriuria (Stable) - 12/14/2018, (Stable), - 2020 (Stable), - 2020 (Stable), - 2018 (Stable), - 2018, - 2018 Diabetes Type 2 Hypertension Unspecified dementia without behavioral disturbance    FAMILY HISTORY: No pertinent family history - Other Prostate Cancer - No Family History   SOCIAL HISTORY: Marital Status: Married Preferred Language: English; Ethnicity: Not Hispanic Or Latino; Race: White Current Smoking Status: Patient has never smoked.  Has never drank.  Drinks 2 caffeinated drinks per day. Has not had a blood transfusion.     Notes: Never A Smoker   REVIEW OF SYSTEMS:    GU Review Male:   Patient denies frequent urination, hard to postpone urination, burning/ pain  with urination, get up at night to urinate, leakage of urine, stream starts and stops, trouble starting your streams, and have to strain to urinate .  Gastrointestinal (Upper):   Patient denies nausea and vomiting.  Gastrointestinal (Lower):   Patient denies diarrhea and constipation.  Constitutional:   Patient denies fever, night sweats, weight loss, and fatigue.  Skin:   Patient denies skin rash/ lesion and itching.  Eyes:   Patient denies blurred vision and double vision.  Ears/ Nose/ Throat:   Patient denies sore throat and sinus problems.  Hematologic/Lymphatic:   Patient denies swollen glands and easy bruising.  Cardiovascular:   Patient denies leg swelling and chest pains.   Respiratory:   Patient denies cough and shortness of breath.  Endocrine:   Patient denies excessive thirst.  Musculoskeletal:   Patient denies back pain and joint pain.  Neurological:   Patient denies headaches and dizziness.  Psychologic:   Patient denies depression and anxiety.   VITAL SIGNS:     Weight 156 lb / 70.76 kg  Height 69 in / 175.26 cm  BMI 23.0 kg/m   MULTI-SYSTEM PHYSICAL EXAMINATION:    Constitutional: Well-nourished. No physical deformities. Normally developed. Good grooming.  Respiratory: No labored breathing, no use of accessory muscles. Clear bilaterally.  Cardiovascular: Normal temperature, normal extremity pulses, no swelling, no varicosities. Regular rate and rhythm.     Complexity of Data:  Records Review:   Previous Patient Records   11/23/12 11/18/11 11/28/10 01/08/10 12/20/03  PSA  Total PSA 0.35  0.42  0.46  0.49  2.42      ASSESSMENT:      ICD-10 Details  1 GU:   History of bladder cancer - Z85.51   2   Ureteral obstruction - N13.1    PLAN:     1. Bladder cancer: He will continue follow-up with Dr. Alen Blew. He has not had any signs of recurrence since definitive therapy.   2. Right ureteral obstruction: His urinalysis actually appears better than it has in quite some time. This will be cultured in preparation for cystoscopy and right ureteral stent exchange which will be scheduled for late May.

## 2020-12-02 ENCOUNTER — Encounter (HOSPITAL_COMMUNITY)
Admission: RE | Disposition: A | Discharge status: Home / Self Care | Payer: Self-pay | Source: Home / Self Care | Attending: Urology

## 2020-12-02 ENCOUNTER — Ambulatory Visit (HOSPITAL_COMMUNITY): Payer: Medicare Other | Admitting: Emergency Medicine

## 2020-12-02 ENCOUNTER — Ambulatory Visit (HOSPITAL_COMMUNITY): Payer: Medicare Other

## 2020-12-02 ENCOUNTER — Ambulatory Visit (HOSPITAL_COMMUNITY): Payer: Medicare Other | Admitting: Certified Registered"

## 2020-12-02 ENCOUNTER — Ambulatory Visit (HOSPITAL_COMMUNITY)
Admission: RE | Admit: 2020-12-02 | Discharge: 2020-12-02 | Disposition: A | Discharge status: Home / Self Care | Payer: Medicare Other | Attending: Urology | Admitting: Urology

## 2020-12-02 ENCOUNTER — Encounter (HOSPITAL_COMMUNITY): Payer: Self-pay | Admitting: Urology

## 2020-12-02 DIAGNOSIS — Z8551 Personal history of malignant neoplasm of bladder: Secondary | ICD-10-CM | POA: Diagnosis not present

## 2020-12-02 DIAGNOSIS — Z79899 Other long term (current) drug therapy: Secondary | ICD-10-CM | POA: Diagnosis not present

## 2020-12-02 DIAGNOSIS — Z881 Allergy status to other antibiotic agents status: Secondary | ICD-10-CM | POA: Diagnosis not present

## 2020-12-02 DIAGNOSIS — Z792 Long term (current) use of antibiotics: Secondary | ICD-10-CM | POA: Diagnosis not present

## 2020-12-02 DIAGNOSIS — Z923 Personal history of irradiation: Secondary | ICD-10-CM | POA: Diagnosis not present

## 2020-12-02 DIAGNOSIS — N135 Crossing vessel and stricture of ureter without hydronephrosis: Secondary | ICD-10-CM | POA: Diagnosis not present

## 2020-12-02 DIAGNOSIS — I10 Essential (primary) hypertension: Secondary | ICD-10-CM | POA: Diagnosis not present

## 2020-12-02 DIAGNOSIS — E7800 Pure hypercholesterolemia, unspecified: Secondary | ICD-10-CM | POA: Diagnosis not present

## 2020-12-02 DIAGNOSIS — Z7984 Long term (current) use of oral hypoglycemic drugs: Secondary | ICD-10-CM | POA: Insufficient documentation

## 2020-12-02 DIAGNOSIS — N131 Hydronephrosis with ureteral stricture, not elsewhere classified: Secondary | ICD-10-CM | POA: Diagnosis not present

## 2020-12-02 DIAGNOSIS — Z888 Allergy status to other drugs, medicaments and biological substances status: Secondary | ICD-10-CM | POA: Diagnosis not present

## 2020-12-02 DIAGNOSIS — D509 Iron deficiency anemia, unspecified: Secondary | ICD-10-CM | POA: Diagnosis not present

## 2020-12-02 DIAGNOSIS — Z882 Allergy status to sulfonamides status: Secondary | ICD-10-CM | POA: Insufficient documentation

## 2020-12-02 HISTORY — PX: CYSTOSCOPY WITH STENT PLACEMENT: SHX5790

## 2020-12-02 LAB — GLUCOSE, CAPILLARY
Glucose-Capillary: 169 mg/dL — ABNORMAL HIGH (ref 70–99)
Glucose-Capillary: 166 mg/dL — ABNORMAL HIGH (ref 70–99)

## 2020-12-02 SURGERY — CYSTOSCOPY, WITH STENT INSERTION
Anesthesia: General | Laterality: Right

## 2020-12-02 MED ORDER — ORAL CARE MOUTH RINSE: 15.0000 mL | Freq: Once | OROMUCOSAL | Status: DC

## 2020-12-02 MED ORDER — AMISULPRIDE (ANTIEMETIC) 5 MG/2ML IV SOLN: 10.0000 mg | Freq: Once | INTRAVENOUS | Status: DC | PRN

## 2020-12-02 MED ORDER — FENTANYL CITRATE (PF) 100 MCG/2ML IJ SOLN
INTRAMUSCULAR | Status: DC | PRN
  Administered 2020-12-02: 25 ug via INTRAVENOUS

## 2020-12-02 MED ORDER — EPHEDRINE 5 MG/ML INJ
INTRAVENOUS | Status: AC
  Filled 2020-12-02: qty 10

## 2020-12-02 MED ORDER — ACETAMINOPHEN 10 MG/ML IV SOLN: 1000.0000 mg | Freq: Once | INTRAVENOUS | Status: DC | PRN

## 2020-12-02 MED ORDER — LIDOCAINE 2% (20 MG/ML) 5 ML SYRINGE
INTRAMUSCULAR | Status: AC
  Filled 2020-12-02: qty 5

## 2020-12-02 MED ORDER — CHLORHEXIDINE GLUCONATE 0.12 % MT SOLN: 15.0000 mL | Freq: Once | OROMUCOSAL | Status: DC

## 2020-12-02 MED ORDER — SODIUM CHLORIDE 0.9 % IV SOLN
2.0000 g | Freq: Once | INTRAVENOUS | Status: AC
  Administered 2020-12-02: 2 g via INTRAVENOUS
  Filled 2020-12-02: qty 20

## 2020-12-02 MED ORDER — ONDANSETRON HCL 4 MG/2ML IJ SOLN
INTRAMUSCULAR | Status: DC | PRN
  Administered 2020-12-02: 4 mg via INTRAVENOUS

## 2020-12-02 MED ORDER — DEXAMETHASONE SODIUM PHOSPHATE 4 MG/ML IJ SOLN
INTRAMUSCULAR | Status: DC | PRN
  Administered 2020-12-02: 4 mg via INTRAVENOUS

## 2020-12-02 MED ORDER — PHENYLEPHRINE 40 MCG/ML (10ML) SYRINGE FOR IV PUSH (FOR BLOOD PRESSURE SUPPORT)
PREFILLED_SYRINGE | INTRAVENOUS | Status: DC | PRN
  Administered 2020-12-02: 80 ug via INTRAVENOUS

## 2020-12-02 MED ORDER — STERILE WATER FOR IRRIGATION IR SOLN
Status: DC | PRN
  Administered 2020-12-02: 3000 mL

## 2020-12-02 MED ORDER — ONDANSETRON HCL 4 MG/2ML IJ SOLN: 4.0000 mg | Freq: Once | INTRAMUSCULAR | Status: DC | PRN

## 2020-12-02 MED ORDER — PROPOFOL 10 MG/ML IV BOLUS
INTRAVENOUS | Status: DC | PRN
  Administered 2020-12-02: 100 mg via INTRAVENOUS

## 2020-12-02 MED ORDER — PHENYLEPHRINE 40 MCG/ML (10ML) SYRINGE FOR IV PUSH (FOR BLOOD PRESSURE SUPPORT)
PREFILLED_SYRINGE | INTRAVENOUS | Status: AC
  Filled 2020-12-02: qty 10

## 2020-12-02 MED ORDER — FENTANYL CITRATE (PF) 100 MCG/2ML IJ SOLN: 25.0000 ug | INTRAMUSCULAR | Status: DC | PRN

## 2020-12-02 MED ORDER — PROPOFOL 10 MG/ML IV BOLUS
INTRAVENOUS | Status: AC
  Filled 2020-12-02: qty 20

## 2020-12-02 MED ORDER — LACTATED RINGERS IV SOLN: INTRAVENOUS | Status: DC

## 2020-12-02 MED ORDER — FENTANYL CITRATE (PF) 100 MCG/2ML IJ SOLN
INTRAMUSCULAR | Status: AC
  Filled 2020-12-02: qty 2

## 2020-12-02 MED ORDER — LIDOCAINE HCL (CARDIAC) PF 100 MG/5ML IV SOSY
PREFILLED_SYRINGE | INTRAVENOUS | Status: DC | PRN
  Administered 2020-12-02: 60 mg via INTRAVENOUS

## 2020-12-02 SURGICAL SUPPLY — 13 items
BAG URO CATCHER STRL LF (MISCELLANEOUS) ×2 IMPLANT
CATH INTERMIT  6FR 70CM (CATHETERS) ×2 IMPLANT
CLOTH BEACON ORANGE TIMEOUT ST (SAFETY) ×2 IMPLANT
GLOVE SURG ENC TEXT LTX SZ7.5 (GLOVE) ×2 IMPLANT
GOWN STRL REUS W/TWL LRG LVL3 (GOWN DISPOSABLE) ×4 IMPLANT
GUIDEWIRE STR DUAL SENSOR (WIRE) ×2 IMPLANT
GUIDEWIRE ZIPWRE .038 STRAIGHT (WIRE) IMPLANT
KIT TURNOVER KIT A (KITS) ×2 IMPLANT
MANIFOLD NEPTUNE II (INSTRUMENTS) ×2 IMPLANT
PACK CYSTO (CUSTOM PROCEDURE TRAY) ×2 IMPLANT
STENT CONTOUR NO GW 8FR 26CM (STENTS) ×2 IMPLANT
TUBING CONNECTING 10 (TUBING) ×2 IMPLANT
TUBING UROLOGY SET (TUBING) IMPLANT

## 2020-12-02 NOTE — Op Note (Signed)
Preoperative diagnosis:  Right ureteral obstruction   Postoperative diagnosis:  Right ureteral obstruction   Procedure:  Cystoscopy Right ureteral stent placement (8 x 26 - no string)  Surgeon: Roxy Horseman, Brooke Bonito. M.D.  Anesthesia: General  Complications: None  Intraoperative findings: His indwelling stent was moderately encrusted.  EBL: Minimal  Specimens: None  Indication: [name] [name] is a 80 y.o. patient with right ureteral obstruction. After reviewing the management options for treatment, he elected to proceed with the above surgical procedure(s). We have discussed the potential benefits and risks of the procedure, side effects of the proposed treatment, the likelihood of the patient achieving the goals of the procedure, and any potential problems that might occur during the procedure or recuperation. Informed consent has been obtained.  Description of procedure:  The patient was taken to the operating room and general anesthesia was induced.  The patient was placed in the dorsal lithotomy position, prepped and draped in the usual sterile fashion, and preoperative antibiotics were administered. A preoperative time-out was performed.   Cystourethroscopy was performed.  The patient's urethra was examined and was unremarkable. The bladder was then systematically examined in its entirety. There was no evidence for any bladder tumors, stones, or other mucosal pathology.    Attention then turned to the right ureteral orifice and the patient's indwelling ureteral stent was identified and brought out to the urethral meatus with the flexible graspers.  A 0.38 sensor guidewire was then advanced up the right ureter into the renal pelvis under fluoroscopic guidance.  The wire was then backloaded through the cystoscope and a ureteral stent was advance over the wire using Seldinger technique.  The stent was positioned appropriately under fluoroscopic and cystoscopic guidance.  The  wire was then removed with an adequate stent curl noted in the renal pelvis as well as in the bladder.  The bladder was then emptied and the procedure ended.  The patient appeared to tolerate the procedure well and without complications.  The patient was able to be awakened and transferred to the recovery unit in satisfactory condition.    Pryor Curia MD

## 2020-12-02 NOTE — Discharge Instructions (Signed)

## 2020-12-02 NOTE — Anesthesia Postprocedure Evaluation (Signed)
Anesthesia Post Note  Patient: [name] [name]  Procedure(s) Performed: CYSTOSCOPY WITH STENT CHANGE (Right)     Patient location during evaluation: PACU Anesthesia Type: General Level of consciousness: awake Pain management: pain level controlled Vital Signs Assessment: post-procedure vital signs reviewed and stable Respiratory status: spontaneous breathing, nonlabored ventilation, respiratory function stable and patient connected to nasal cannula oxygen Cardiovascular status: blood pressure returned to baseline and stable Postop Assessment: no apparent nausea or vomiting Anesthetic complications: no   No notable events documented.  Last Vitals:  Vitals:   12/02/20 1157 12/02/20 1212  BP: (!) 151/65 139/76  Pulse: 65 64  Resp: 14 14  Temp: 36.6 C   SpO2: 100% 100%    Last Pain:  Vitals:   12/02/20 1212  TempSrc:   PainSc: 0-No pain                 Shakeisha Horine P Pasquale Matters

## 2020-12-02 NOTE — Transfer of Care (Signed)
Immediate Anesthesia Transfer of Care Note  Patient: [name] [name]  Procedure(s) Performed: CYSTOSCOPY WITH STENT CHANGE (Right)  Patient Location: PACU  Anesthesia Type:General  Level of Consciousness: drowsy  Airway & Oxygen Therapy: Patient Spontanous Breathing and Patient connected to face mask oxygen  Post-op Assessment: Report given to RN and Post -op Vital signs reviewed and stable  Post vital signs: Reviewed and stable  Last Vitals:  Vitals Value Taken Time  BP 112/52 12/02/20 1115  Temp    Pulse    Resp 12 12/02/20 1115  SpO2    Vitals shown include unvalidated device data.  Last Pain:  Vitals:   12/02/20 0936  TempSrc: Oral         Complications: No notable events documented.

## 2020-12-03 ENCOUNTER — Encounter (HOSPITAL_COMMUNITY): Payer: Self-pay | Admitting: Urology

## 2020-12-18 ENCOUNTER — Other Ambulatory Visit: Payer: Medicare Other | Admitting: Hospice

## 2020-12-18 ENCOUNTER — Other Ambulatory Visit: Payer: Self-pay

## 2020-12-18 DIAGNOSIS — E119 Type 2 diabetes mellitus without complications: Secondary | ICD-10-CM

## 2020-12-18 DIAGNOSIS — Z515 Encounter for palliative care: Secondary | ICD-10-CM

## 2020-12-18 DIAGNOSIS — F0390 Unspecified dementia without behavioral disturbance: Secondary | ICD-10-CM | POA: Diagnosis not present

## 2020-12-18 NOTE — Progress Notes (Signed)
[facility] Consult Note Telephone: [phone]  Fax: [phone]  PATIENT NAME: [name] [name] DOB: 1940-11-05 MRN: [id number]  PRIMARY CARE PROVIDER:   [name], [place], [place], [address] [place],  [place] [address]  REFERRING PROVIDER: [name], MD [name], [place] [address] [place] [place],  [place] [address]  RESPONSIBLE PARTY:  spouse Contact Information     Name Relation Home Work Mobile   [place] Spouse [phone]  [phone]   [place], [place] [phone]  [phone]     TELEHEALTH VISIT STATEMENT Due to the COVID-19 crisis, this visit was done via telemedicine and it was initiated and consent by this patient and or family. Video-audio (telehealth) contact was unable to be done due to technical barriers from the patient's side.   Visit is to build trust and highlight Palliative Medicine as specialized medical care for people living with serious illness, aimed at facilitating better quality of life through symptoms relief, assisting with advance care planning and complex medical decision making. This is a follow up visit.  RECOMMENDATIONS/PLAN:   Advance Care Planning/Code Status: Patient is a Do Not Resuscitate.   Goals of Care: Goals of care include to maximize quality of life and symptom management.  Visit consisted of counseling and education dealing with the complex and emotionally intense issues of symptom management and palliative care in the setting of serious and potentially life-threatening illness. Spouse reports patient continues to decline in functional status and family is open to hospice service in the future. NP assured her of smooth transition when the time comes. Palliative care team will continue to support patient, patient's family, and medical team.  Symptom management/Plan:  Dementia: Worsening dementia in line with  Dementia disease trajectory. Decline from bowel continence to now bowel incontinence, with limited language usage, FAST 7A Lethargy: Resolved after completion of antibiotics for UTI Type 2 DM: Continue with Metformin. Give with apple sauce for easier swallowing. Aspiration precautions discussed, especially as patient dementia worsens.  Follow up: Palliative care will continue to follow for complex medical decision making, advance care planning, and clarification of goals. Return 6 weeks or prn. Encouraged to call provider sooner with any concerns.  CHIEF COMPLAINT: Palliative follow up  HISTORY OF PRESENT ILLNESS:  [name] [name] a 80 y.o. male with multiple medical problems including worsening Dementia; patient is now incontinent of bowel and bladder, with limited language ability, not able to engage in meaningful conversation. History of bladder cancer, dementia, type 2 diabetes mellitus, hypertension, cancer of skin of the neck. History obtained from review of EMR, discussion with primary team, family and/or patient. Records reviewed and summarized above. All 10 point systems reviewed and are negative except as documented in history of present illness above  Review and summarization of Epic records shows history from other than patient.   Palliative Care was asked to follow this patient o help address complex decision making in the context of advance care planning and goals of care clarification.    PERTINENT MEDICATIONS:  Outpatient Encounter Medications as of 12/18/2020  Medication Sig   Cyanocobalamin (VITAMIN B-12 CR PO) Take 1 tablet by mouth every evening.   dutasteride (AVODART) 0.5 MG capsule Take 0.5 mg by mouth in the morning.   hydrochlorothiazide (HYDRODIURIL) 12.5 MG tablet Take 12.5 mg by mouth in the morning.   Insulin Pen Needle (PEN NEEDLES) 31G X 5 MM MISC See admin instructions.   memantine (NAMENDA XR) 28 MG  CP24 24 hr capsule Take 28 mg by mouth in the morning.    metFORMIN (GLUCOPHAGE-XR) 750 MG 24 hr tablet Take 750 mg by mouth every evening.   metoprolol succinate (TOPROL-XL) 25 MG 24 hr tablet Take 25 mg by mouth in the morning.   Multiple Vitamin (MULTIVITAMIN WITH MINERALS) TABS tablet Take 1 tablet by mouth every evening.   ONETOUCH DELICA LANCETS 24M MISC 2 (two) times daily. as directed   perindopril (ACEON) 4 MG tablet Take 4 mg by mouth in the morning.   sitaGLIPtin (JANUVIA) 100 MG tablet Take 100 mg by mouth in the morning.   No facility-administered encounter medications on file as of 12/18/2020.    HOSPICE ELIGIBILITY/DIAGNOSIS: TBD  PAST MEDICAL HISTORY:  Past Medical History:  Diagnosis Date   Alzheimer disease ([facility])    Alzheimer's disease ([facility])    Anemia    Bilateral cold feet    Bladder cancer ([facility])    dx in 2013.07/08/19 skin CA in neck   BPH (benign prostatic hypertrophy)    Dementia ([facility]) 12-11-11   memory short term(steadily progressive worsening-pt. unaware) .dx. 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   moderate left hemidiaphragm elevation   Hard of hearing    Hearing loss    has one hearing aid, doesn't wear   History of chemotherapy    History of gallstones 2005   History of kidney stones    Hx of nonmelanoma skin cancer    Hypercholesterolemia    Hypertension 12-11-11   controlled with meds   Increased frequency of urination    wife reports urinary frequency has not improved since last surgery    Paralysis [facility])    Personal history of radiation exposure    Pulmonary nodules    Bilateral   Pyelonephritis    hospital admission july 2020   Renal calculus, left 10/12/2013   Urinary urgency    UTI (urinary tract infection) 08-09-13   multiple, last tx. 1 week ago     ALLERGIES:  Allergies  Allergen Reactions   Sulfa Antibiotics Hives   Tetracyclines & Related Rash   Aricept [Donepezil Hcl] Nausea And Vomiting      I spent 50 minutes providing  this consultation; this includes time spent with patient/family, chart review and documentation. More than 50% of the time in this consultation was spent on counseling and coordinating communication   Thank you for the opportunity to participate in the care of [name] [name] Please call our office at [phone] if we can be of additional assistance.  Note: Portions of this note were generated with [employer]. Dictation errors may occur despite best attempts at proofreading.  Teodoro Spray, NP

## 2020-12-19 DIAGNOSIS — E118 Type 2 diabetes mellitus with unspecified complications: Secondary | ICD-10-CM | POA: Diagnosis not present

## 2020-12-19 DIAGNOSIS — E782 Mixed hyperlipidemia: Secondary | ICD-10-CM | POA: Diagnosis not present

## 2020-12-26 DIAGNOSIS — E1165 Type 2 diabetes mellitus with hyperglycemia: Secondary | ICD-10-CM | POA: Diagnosis not present

## 2020-12-26 DIAGNOSIS — F0390 Unspecified dementia without behavioral disturbance: Secondary | ICD-10-CM | POA: Diagnosis not present

## 2020-12-26 DIAGNOSIS — I10 Essential (primary) hypertension: Secondary | ICD-10-CM | POA: Diagnosis not present

## 2020-12-26 DIAGNOSIS — E782 Mixed hyperlipidemia: Secondary | ICD-10-CM | POA: Diagnosis not present

## 2021-01-07 ENCOUNTER — Other Ambulatory Visit: Payer: Self-pay

## 2021-01-07 ENCOUNTER — Encounter: Payer: Self-pay | Admitting: Podiatry

## 2021-01-07 ENCOUNTER — Ambulatory Visit (INDEPENDENT_AMBULATORY_CARE_PROVIDER_SITE_OTHER): Payer: Medicare Other | Admitting: Podiatry

## 2021-01-07 DIAGNOSIS — M79674 Pain in right toe(s): Secondary | ICD-10-CM | POA: Diagnosis not present

## 2021-01-07 DIAGNOSIS — B351 Tinea unguium: Secondary | ICD-10-CM | POA: Diagnosis not present

## 2021-01-07 DIAGNOSIS — E1165 Type 2 diabetes mellitus with hyperglycemia: Secondary | ICD-10-CM | POA: Insufficient documentation

## 2021-01-07 DIAGNOSIS — M79675 Pain in left toe(s): Secondary | ICD-10-CM

## 2021-01-08 DIAGNOSIS — Z9889 Other specified postprocedural states: Secondary | ICD-10-CM | POA: Diagnosis not present

## 2021-01-08 DIAGNOSIS — C44229 Squamous cell carcinoma of skin of left ear and external auricular canal: Secondary | ICD-10-CM | POA: Diagnosis not present

## 2021-01-08 DIAGNOSIS — C7989 Secondary malignant neoplasm of other specified sites: Secondary | ICD-10-CM | POA: Diagnosis not present

## 2021-01-08 DIAGNOSIS — R29810 Facial weakness: Secondary | ICD-10-CM | POA: Diagnosis not present

## 2021-01-08 DIAGNOSIS — Z85828 Personal history of other malignant neoplasm of skin: Secondary | ICD-10-CM | POA: Diagnosis not present

## 2021-01-08 DIAGNOSIS — C4442 Squamous cell carcinoma of skin of scalp and neck: Secondary | ICD-10-CM | POA: Diagnosis not present

## 2021-01-11 ENCOUNTER — Encounter: Payer: Self-pay | Admitting: Podiatry

## 2021-01-11 NOTE — Progress Notes (Signed)
  Subjective:  Patient ID: [name] [name], male    DOB: 1941/05/22,  MRN: [id number]  [name] [name] presents to clinic today for preventative diabetic foot care and thick, elongated toenails b/l feet which are tender when wearing enclosed shoe gear.  He hs h/o dementia. His wife is present during today's visit. She relates she, herself, has had a stroke since their last visit here. She has recovered and is doing well.  Wife relates no new pedal concerns for Mr. Redington on today's visit.  Patient did not check blood glucose today.  PCP is [name], MD , and last visit was 12/26/2020.  Allergies  Allergen Reactions   Sulfa Antibiotics Hives   Tetracyclines & Related Rash   Aricept [Donepezil Hcl] Nausea And Vomiting   Donepezil     Other reaction(s): Unknown   Rosanil Cleanser [Sulfacetamide Sodium-Sulfur]     Other reaction(s): Unknown    Review of Systems: Negative except as noted in the HPI. Objective:   Constitutional [name] [name] is a pleasant 80 y.o. Caucasian male, WD, WN in NAD. Somnolent and in NAD on today's visit.  Vascular Capillary refill time to digits immediate b/l. Palpable DP pulse(s) b/l lower extremities Palpable PT pulse(s) b/l lower extremities Pedal hair absent. Lower extremity skin temperature gradient within normal limits. No pain with calf compression b/l. Trace edema noted b/l lower extremities. No cyanosis or clubbing noted.  Neurologic Normal speech. Oriented to person, place, and time. Protective sensation intact 5/5 intact bilaterally with 10g monofilament b/l. Vibratory sensation intact b/l.  Dermatologic Pedal skin is thin shiny, atrophic b/l lower extremities. No open wounds b/l lower extremities. No interdigital macerations b/l lower extremities. Toenails 1-5 b/l elongated, discolored, dystrophic, thickened, crumbly with subungual debris and tenderness to dorsal palpation.  Orthopedic: Normal muscle strength 5/5 to all lower  extremity muscle groups bilaterally. No pain crepitus or joint limitation noted with ROM b/l lower extremities. No gross bony deformities b/l lower extremities.   Radiographs: None Assessment:   1. Pain due to onychomycosis of toenails of both feet    Plan:  -No new findings. No new orders. -Continue diabetic foot care principles. -Patient to continue soft, supportive shoe gear daily. -Toenails 1-5 b/l were debrided in length and girth with sterile nail nippers and dremel without iatrogenic bleeding.  -Patient to report any pedal injuries to medical professional immediately. -Patient/POA to call should there be question/concern in the interim.  Return in about 3 months (around 04/09/2021).  Marzetta Board, DPM

## 2021-01-16 DIAGNOSIS — Z85828 Personal history of other malignant neoplasm of skin: Secondary | ICD-10-CM | POA: Diagnosis not present

## 2021-01-16 DIAGNOSIS — L858 Other specified epidermal thickening: Secondary | ICD-10-CM | POA: Diagnosis not present

## 2021-01-16 DIAGNOSIS — L570 Actinic keratosis: Secondary | ICD-10-CM | POA: Diagnosis not present

## 2021-01-16 DIAGNOSIS — D692 Other nonthrombocytopenic purpura: Secondary | ICD-10-CM | POA: Diagnosis not present

## 2021-02-04 DIAGNOSIS — N131 Hydronephrosis with ureteral stricture, not elsewhere classified: Secondary | ICD-10-CM | POA: Diagnosis not present

## 2021-02-04 DIAGNOSIS — Z8551 Personal history of malignant neoplasm of bladder: Secondary | ICD-10-CM | POA: Diagnosis not present

## 2021-02-05 ENCOUNTER — Other Ambulatory Visit: Payer: Self-pay

## 2021-02-05 ENCOUNTER — Other Ambulatory Visit: Payer: Medicare Other | Admitting: Hospice

## 2021-02-05 DIAGNOSIS — C4440 Unspecified malignant neoplasm of skin of scalp and neck: Secondary | ICD-10-CM | POA: Diagnosis not present

## 2021-02-05 DIAGNOSIS — F0390 Unspecified dementia without behavioral disturbance: Secondary | ICD-10-CM | POA: Diagnosis not present

## 2021-02-05 DIAGNOSIS — E119 Type 2 diabetes mellitus without complications: Secondary | ICD-10-CM | POA: Diagnosis not present

## 2021-02-05 DIAGNOSIS — R531 Weakness: Secondary | ICD-10-CM

## 2021-02-05 DIAGNOSIS — Z515 Encounter for palliative care: Secondary | ICD-10-CM | POA: Diagnosis not present

## 2021-02-05 NOTE — Progress Notes (Signed)
[facility] Consult Note Telephone: [phone]  Fax: [phone]  PATIENT NAME: [name] [name] DOB: Nov 08, 1940 MRN: [id number]  PRIMARY CARE PROVIDER:   [name], [place], [place], [place] [place],  [place] [address]  REFERRING PROVIDER: [name], [place], [place], [place] [address] [place],  [place] [address]  RESPONSIBLE PARTY: Spouse Contact Information     Name Relation Home Work [place] Spouse [phone]  [phone]   Isaid, [place] [phone]  [phone]       Visit is to build trust and highlight Palliative Medicine as specialized medical care for people living with serious illness, aimed at facilitating better quality of life through symptoms relief, assisting with advance care planning and complex medical decision making. This is a follow up visit.  RECOMMENDATIONS/PLAN:   Advance Care Planning/Code Status: Patient is a DO NOT RESUSCITATE  Goals of Care: Goals of care include to maximize quality of life and symptom management.  Visit consisted of counseling and education dealing with the complex and emotionally intense issues of symptom management and palliative care in the setting of serious and potentially life-threatening illness. Palliative care team will continue to support patient, patient's family, and medical team.  Symptom management/Plan:   Dementia: Ongoing decline in functional status in line with dementia disease trajectory.  FAST 7A PPS 50% down from 60% 6 months ago Weakness: Ongoing.  Patient still ambulatory.  Encouraged physical activity as tolerated, balance of rest and performance activity Cancer of skin of neck: No plan for chemo/radiation.  Type 2 DM: Managed with [place]  CBG this morning is 116.  Check A1c, routine CBC BMP. Aspiration precautions discussed, especially as patient dementia worsens.   Follow up: Palliative care will continue to follow for complex medical decision making, advance care planning, and clarification of goals. Return 6 weeks or prn. Encouraged to call provider sooner with any concerns.   CHIEF COMPLAINT: Palliative follow up   HISTORY OF PRESENT ILLNESS:  [name] [name] a 80 y.o. 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 History of bladder cancer, dementia, type 2 diabetes mellitus, hypertension, cancer of skin of the neck.History obtained from review of EMR, discussion with primary team, family and/or patient. Records reviewed and summarized above. All 10 point systems reviewed and are negative except as documented in history of present illness above  Review and summarization of Epic records shows history from other than patient.   Palliative Care was asked to follow this patient o help address complex decision making in the context of advance care planning and goals of care clarification.   PHYSICAL EXAM  General: In no acute distress, appropriately dressed, alert, interactive, unilateral facial asymmetry and weakness Cardiovascular: regular rate and rhythm; no edema in BLE Pulmonary: no cough, no increased work of breathing, normal respiratory effort, no adventitious lung sounds on auscultation Abdomen: soft, non tender, no guarding, positive bowel sounds in all quadrants GU:  no suprapubic tenderness Eyes: Normal lids, no discharge ENMT: Moist mucous membranes Musculoskeletal:  weakness, ambulatory Skin: healing scars and wounds in L ear, head and neck areas from CA Psych: non-anxious affect Neurological: Weakness but otherwise non focal, memory loss/confusion Heme/lymph/immuno: no bruises, no bleeding  PERTINENT MEDICATIONS:  Outpatient Encounter Medications as of  02/05/2021  Medication Sig   dutasteride (AVODART) 0.5 MG capsule Take 0.5 mg by mouth in the morning.   glimepiride (AMARYL) 2 MG tablet Take 2 mg by mouth every morning.   hydrochlorothiazide (HYDRODIURIL) 12.5 MG tablet Take 12.5 mg by mouth in the morning.   Insulin Pen Needle (PEN NEEDLES) 31G X 5 MM MISC See admin instructions.   memantine (NAMENDA XR) 28 MG CP24 24 hr capsule Take 28 mg by mouth in the morning.   metoprolol succinate (TOPROL-XL) 25 MG 24 hr tablet Take 25 mg by mouth in the morning.   ONETOUCH DELICA LANCETS 99991111 MISC 2 (two) times daily. as directed   perindopril (ACEON) 4 MG tablet Take 4 mg by mouth in the morning.   sitaGLIPtin (JANUVIA) 100 MG tablet Take 100 mg by mouth in the morning.   No facility-administered encounter medications on file as of 02/05/2021.    HOSPICE ELIGIBILITY/DIAGNOSIS: TBD  PAST MEDICAL HISTORY:  Past Medical History:  Diagnosis Date   Alzheimer disease ([facility])    Alzheimer's disease ([facility])    Anemia    Bilateral cold feet    Bladder cancer ([facility])    dx in 2013.07/08/19 skin CA in neck   BPH (benign prostatic hypertrophy)    Dementia ([facility]) 12-11-11   memory short term(steadily progressive worsening-pt. unaware) .dx. 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   moderate left hemidiaphragm elevation   Hard of hearing    Hearing loss    has one hearing aid, doesn't wear   History of chemotherapy    History of gallstones 2005   History of kidney stones    Hx of nonmelanoma skin cancer    Hypercholesterolemia    Hypertension 12-11-11   controlled with meds   Increased frequency of urination    wife reports urinary frequency has not improved since last surgery    Paralysis [facility])    Personal history of radiation exposure    Pulmonary nodules    Bilateral   Pyelonephritis    hospital admission july 2020   Renal calculus, left 10/12/2013   Urinary urgency    UTI (urinary tract  infection) 08-09-13   multiple, last tx. 1 week ago      ALLERGIES:  Allergies  Allergen Reactions   Sulfa Antibiotics Hives   Tetracyclines & Related Rash   Aricept [Donepezil Hcl] Nausea And Vomiting   Donepezil     Other reaction(s): Unknown   Rosanil Cleanser [Sulfacetamide Sodium-Sulfur]     Other reaction(s): Unknown      I spent  40 minutes providing this consultation; this includes time spent with patient/family, chart review and documentation. More than 50% of the time in this consultation was spent on counseling and coordinating communication   Thank you for the opportunity to participate in the care of Ackeem Bailin Please call our office at [phone] if we can be of additional assistance.  Note: Portions of this note were generated with Lobbyist. Dictation errors may occur despite best attempts at proofreading.  Teodoro Spray, NP

## 2021-02-06 DIAGNOSIS — I10 Essential (primary) hypertension: Secondary | ICD-10-CM | POA: Diagnosis not present

## 2021-02-06 DIAGNOSIS — E782 Mixed hyperlipidemia: Secondary | ICD-10-CM | POA: Diagnosis not present

## 2021-02-06 DIAGNOSIS — E1165 Type 2 diabetes mellitus with hyperglycemia: Secondary | ICD-10-CM | POA: Diagnosis not present

## 2021-02-10 ENCOUNTER — Other Ambulatory Visit: Payer: Medicare Other | Admitting: Hospice

## 2021-02-10 ENCOUNTER — Other Ambulatory Visit: Payer: Self-pay

## 2021-02-10 DIAGNOSIS — C7989 Secondary malignant neoplasm of other specified sites: Secondary | ICD-10-CM

## 2021-02-10 DIAGNOSIS — F0390 Unspecified dementia without behavioral disturbance: Secondary | ICD-10-CM

## 2021-02-10 DIAGNOSIS — F03C0 Unspecified dementia, severe, without behavioral disturbance, psychotic disturbance, mood disturbance, and anxiety: Secondary | ICD-10-CM

## 2021-02-10 DIAGNOSIS — C4440 Unspecified malignant neoplasm of skin of scalp and neck: Secondary | ICD-10-CM

## 2021-02-10 NOTE — Progress Notes (Signed)
[facility] Consult Note Telephone: [phone]  Fax: [phone]  PATIENT NAME: [name] [name] DOB: 03-Sep-1940 MRN: [id number]  PRIMARY CARE PROVIDER:   Merrilee [name], [place], [place], [place],  [place] [address]  REFERRING PROVIDER: [name], [place] [name] [name], [place],  [place] [address]  RESPONSIBLE PARTY:  spouse Contact Information     Name Relation Home Work Mobile   [name] [name] Spouse [phone]  [phone]   [name] [name] [phone]  [phone]      TELEHEALTH VISIT STATEMENT Due to the COVID-19 crisis, this visit was done via telemedicine and it was initiated and consent by this patient and or family. Video-audio (telehealth) contact was unable to be done due to technical barriers from the patient's side.   Visit at the request of spouse reporting patient's significant decline in functional status. This is a follow up visit.  RECOMMENDATIONS/PLAN:   Advance Care Planning/Code Status:Patient is a DO NOT RESUSCITATE  Goals of Care: Goals of care include to maximize quality of life and symptom management. Discussion on de-escalation of care from disease focused treatment to comfort measures due to poor prognosis.  Spouse elects hospice service at this time. NP collaborated with [facility] supervising physician who endorsed patient's hospice eligibility.  PCP also contacted accordingly.  Hospice referral initiated.   Visit consisted of counseling and education dealing with the complex and emotionally intense issues of symptom management and palliative care in the setting of serious and potentially life-threatening illness. Palliative care team will continue to support patient, patient's family, and medical team.  Symptom management/Plan:  Significant rapid decline in functional status, patient unable to walk, falling out of bed  twice in the past 3 days, no injuries, sleeping up to 15 hours a day, declined oral intake down to sips and bites. 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   Patient with underlining advanced dementia, fast 7C.  Family desiring hospice care.  Follow up: Palliative care will continue to follow for complex medical decision making, advance care planning, and clarification of goals. Return 6 weeks or prn. Encouraged to call provider sooner with any concerns.  CHIEF COMPLAINT: Palliative follow up  HISTORY OF PRESENT ILLNESS:  [name] [name] a 80 y.o. 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  Patient with worsening weakness and overall decline in functional status; now nonambulatory, bedbound requiring max assist times in activities of daily living.  He denies pain/discomfort, endorses worsening weakness.  History obtained from review of EMR, discussion with primary team, family and/or patient. Records reviewed and summarized above. All 10 point systems reviewed and are negative except as documented in history of present illness above  Review and summarization of Epic records shows history from other than patient.   Palliative Care was asked to follow this patient o help address complex decision making in the context of advance care planning and goals of care clarification.   PERTINENT MEDICATIONS:  Outpatient Encounter Medications as of 02/10/2021  Medication Sig   dutasteride (AVODART) 0.5 MG capsule Take 0.5 mg by mouth in the morning.   glimepiride (AMARYL) 2 MG tablet Take 2 mg by mouth every morning.   hydrochlorothiazide (HYDRODIURIL) 12.5 MG tablet Take 12.5 mg by mouth in the morning.   Insulin Pen  Needle (PEN NEEDLES) 31G X 5 MM MISC See admin instructions.   memantine (NAMENDA XR) 28 MG CP24 24 hr  capsule Take 28 mg by mouth in the morning.   metoprolol succinate (TOPROL-XL) 25 MG 24 hr tablet Take 25 mg by mouth in the morning.   ONETOUCH DELICA LANCETS 99991111 MISC 2 (two) times daily. as directed   perindopril (ACEON) 4 MG tablet Take 4 mg by mouth in the morning.   sitaGLIPtin (JANUVIA) 100 MG tablet Take 100 mg by mouth in the morning.   No facility-administered encounter medications on file as of 02/10/2021.    HOSPICE ELIGIBILITY/DIAGNOSIS: TBD  PAST MEDICAL HISTORY:  Past Medical History:  Diagnosis Date   Alzheimer disease ([facility])    Alzheimer's disease ([facility])    Anemia    Bilateral cold feet    Bladder cancer ([facility])    dx in 2013.07/08/19 skin CA in neck   BPH (benign prostatic hypertrophy)    Dementia ([facility]) 12-11-11   memory short term(steadily progressive worsening-pt. unaware) .dx. 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   moderate left hemidiaphragm elevation   Hard of hearing    Hearing loss    has one hearing aid, doesn't wear   History of chemotherapy    History of gallstones 2005   History of kidney stones    Hx of nonmelanoma skin cancer    Hypercholesterolemia    Hypertension 12-11-11   controlled with meds   Increased frequency of urination    wife reports urinary frequency has not improved since last surgery    Paralysis [facility])    Personal history of radiation exposure    Pulmonary nodules    Bilateral   Pyelonephritis    hospital admission july 2020   Renal calculus, left 10/12/2013   Urinary urgency    UTI (urinary tract infection) 08-09-13   multiple, last tx. 1 week ago      ALLERGIES:  Allergies  Allergen Reactions   Sulfa Antibiotics Hives   Tetracyclines & Related Rash   Aricept [Donepezil Hcl] Nausea And Vomiting   Donepezil     Other reaction(s): Unknown   Rosanil Cleanser [Sulfacetamide Sodium-Sulfur]     Other reaction(s): Unknown      I spent 60 minutes providing this  consultation; this includes time spent with patient/family, chart review and documentation. More than 50% of the time in this consultation was spent on counseling and coordinating communication   Thank you for the opportunity to participate in the care of [name] [name] Please call our office at [phone] if we can be of additional assistance.  Note: Portions of this note were generated with Lobbyist. Dictation errors may occur despite best attempts at proofreading.  Teodoro Spray, NP

## 2021-02-11 DIAGNOSIS — R918 Other nonspecific abnormal finding of lung field: Secondary | ICD-10-CM | POA: Diagnosis not present

## 2021-02-11 DIAGNOSIS — Z960 Presence of urogenital implants: Secondary | ICD-10-CM | POA: Diagnosis not present

## 2021-02-11 DIAGNOSIS — R634 Abnormal weight loss: Secondary | ICD-10-CM | POA: Diagnosis not present

## 2021-02-11 DIAGNOSIS — G309 Alzheimer's disease, unspecified: Secondary | ICD-10-CM | POA: Diagnosis not present

## 2021-02-11 DIAGNOSIS — G510 Bell's palsy: Secondary | ICD-10-CM | POA: Diagnosis not present

## 2021-02-11 DIAGNOSIS — Z87448 Personal history of other diseases of urinary system: Secondary | ICD-10-CM | POA: Diagnosis not present

## 2021-02-11 DIAGNOSIS — N400 Enlarged prostate without lower urinary tract symptoms: Secondary | ICD-10-CM | POA: Diagnosis not present

## 2021-02-11 DIAGNOSIS — E785 Hyperlipidemia, unspecified: Secondary | ICD-10-CM | POA: Diagnosis not present

## 2021-02-11 DIAGNOSIS — Z8551 Personal history of malignant neoplasm of bladder: Secondary | ICD-10-CM | POA: Diagnosis not present

## 2021-02-11 DIAGNOSIS — N1832 Chronic kidney disease, stage 3b: Secondary | ICD-10-CM | POA: Diagnosis not present

## 2021-02-11 DIAGNOSIS — I129 Hypertensive chronic kidney disease with stage 1 through stage 4 chronic kidney disease, or unspecified chronic kidney disease: Secondary | ICD-10-CM | POA: Diagnosis not present

## 2021-02-11 DIAGNOSIS — F0280 Dementia in other diseases classified elsewhere without behavioral disturbance: Secondary | ICD-10-CM | POA: Diagnosis not present

## 2021-02-11 DIAGNOSIS — C4442 Squamous cell carcinoma of skin of scalp and neck: Secondary | ICD-10-CM | POA: Diagnosis not present

## 2021-02-11 DIAGNOSIS — D509 Iron deficiency anemia, unspecified: Secondary | ICD-10-CM | POA: Diagnosis not present

## 2021-02-11 DIAGNOSIS — T8189XD Other complications of procedures, not elsewhere classified, subsequent encounter: Secondary | ICD-10-CM | POA: Diagnosis not present

## 2021-02-11 DIAGNOSIS — C7989 Secondary malignant neoplasm of other specified sites: Secondary | ICD-10-CM | POA: Diagnosis not present

## 2021-02-11 DIAGNOSIS — Z9221 Personal history of antineoplastic chemotherapy: Secondary | ICD-10-CM | POA: Diagnosis not present

## 2021-02-11 DIAGNOSIS — Z923 Personal history of irradiation: Secondary | ICD-10-CM | POA: Diagnosis not present

## 2021-02-11 DIAGNOSIS — Z9089 Acquired absence of other organs: Secondary | ICD-10-CM | POA: Diagnosis not present

## 2021-02-11 DIAGNOSIS — Z8679 Personal history of other diseases of the circulatory system: Secondary | ICD-10-CM | POA: Diagnosis not present

## 2021-02-11 DIAGNOSIS — E1122 Type 2 diabetes mellitus with diabetic chronic kidney disease: Secondary | ICD-10-CM | POA: Diagnosis not present

## 2021-02-12 DIAGNOSIS — C4442 Squamous cell carcinoma of skin of scalp and neck: Secondary | ICD-10-CM | POA: Diagnosis not present

## 2021-02-12 DIAGNOSIS — I129 Hypertensive chronic kidney disease with stage 1 through stage 4 chronic kidney disease, or unspecified chronic kidney disease: Secondary | ICD-10-CM | POA: Diagnosis not present

## 2021-02-12 DIAGNOSIS — C7989 Secondary malignant neoplasm of other specified sites: Secondary | ICD-10-CM | POA: Diagnosis not present

## 2021-02-12 DIAGNOSIS — E785 Hyperlipidemia, unspecified: Secondary | ICD-10-CM | POA: Diagnosis not present

## 2021-02-12 DIAGNOSIS — E1122 Type 2 diabetes mellitus with diabetic chronic kidney disease: Secondary | ICD-10-CM | POA: Diagnosis not present

## 2021-02-12 DIAGNOSIS — N1832 Chronic kidney disease, stage 3b: Secondary | ICD-10-CM | POA: Diagnosis not present

## 2021-02-13 DIAGNOSIS — R634 Abnormal weight loss: Secondary | ICD-10-CM | POA: Diagnosis not present

## 2021-02-13 DIAGNOSIS — D509 Iron deficiency anemia, unspecified: Secondary | ICD-10-CM | POA: Diagnosis not present

## 2021-02-13 DIAGNOSIS — C7989 Secondary malignant neoplasm of other specified sites: Secondary | ICD-10-CM | POA: Diagnosis not present

## 2021-02-13 DIAGNOSIS — N1832 Chronic kidney disease, stage 3b: Secondary | ICD-10-CM | POA: Diagnosis not present

## 2021-02-13 DIAGNOSIS — I129 Hypertensive chronic kidney disease with stage 1 through stage 4 chronic kidney disease, or unspecified chronic kidney disease: Secondary | ICD-10-CM | POA: Diagnosis not present

## 2021-02-13 DIAGNOSIS — C4442 Squamous cell carcinoma of skin of scalp and neck: Secondary | ICD-10-CM | POA: Diagnosis not present

## 2021-02-13 DIAGNOSIS — Z9089 Acquired absence of other organs: Secondary | ICD-10-CM | POA: Diagnosis not present

## 2021-02-13 DIAGNOSIS — F0280 Dementia in other diseases classified elsewhere without behavioral disturbance: Secondary | ICD-10-CM | POA: Diagnosis not present

## 2021-02-13 DIAGNOSIS — N400 Enlarged prostate without lower urinary tract symptoms: Secondary | ICD-10-CM | POA: Diagnosis not present

## 2021-02-13 DIAGNOSIS — E785 Hyperlipidemia, unspecified: Secondary | ICD-10-CM | POA: Diagnosis not present

## 2021-02-13 DIAGNOSIS — Z8551 Personal history of malignant neoplasm of bladder: Secondary | ICD-10-CM | POA: Diagnosis not present

## 2021-02-13 DIAGNOSIS — Z9221 Personal history of antineoplastic chemotherapy: Secondary | ICD-10-CM | POA: Diagnosis not present

## 2021-02-13 DIAGNOSIS — G309 Alzheimer's disease, unspecified: Secondary | ICD-10-CM | POA: Diagnosis not present

## 2021-02-13 DIAGNOSIS — T8189XD Other complications of procedures, not elsewhere classified, subsequent encounter: Secondary | ICD-10-CM | POA: Diagnosis not present

## 2021-02-13 DIAGNOSIS — Z87448 Personal history of other diseases of urinary system: Secondary | ICD-10-CM | POA: Diagnosis not present

## 2021-02-13 DIAGNOSIS — Z8679 Personal history of other diseases of the circulatory system: Secondary | ICD-10-CM | POA: Diagnosis not present

## 2021-02-13 DIAGNOSIS — R918 Other nonspecific abnormal finding of lung field: Secondary | ICD-10-CM | POA: Diagnosis not present

## 2021-02-13 DIAGNOSIS — G510 Bell's palsy: Secondary | ICD-10-CM | POA: Diagnosis not present

## 2021-02-13 DIAGNOSIS — Z923 Personal history of irradiation: Secondary | ICD-10-CM | POA: Diagnosis not present

## 2021-02-13 DIAGNOSIS — E1122 Type 2 diabetes mellitus with diabetic chronic kidney disease: Secondary | ICD-10-CM | POA: Diagnosis not present

## 2021-02-13 DIAGNOSIS — Z960 Presence of urogenital implants: Secondary | ICD-10-CM | POA: Diagnosis not present

## 2021-02-17 DIAGNOSIS — I129 Hypertensive chronic kidney disease with stage 1 through stage 4 chronic kidney disease, or unspecified chronic kidney disease: Secondary | ICD-10-CM | POA: Diagnosis not present

## 2021-02-17 DIAGNOSIS — E785 Hyperlipidemia, unspecified: Secondary | ICD-10-CM | POA: Diagnosis not present

## 2021-02-17 DIAGNOSIS — C7989 Secondary malignant neoplasm of other specified sites: Secondary | ICD-10-CM | POA: Diagnosis not present

## 2021-02-17 DIAGNOSIS — C4442 Squamous cell carcinoma of skin of scalp and neck: Secondary | ICD-10-CM | POA: Diagnosis not present

## 2021-02-17 DIAGNOSIS — N1832 Chronic kidney disease, stage 3b: Secondary | ICD-10-CM | POA: Diagnosis not present

## 2021-02-17 DIAGNOSIS — E1122 Type 2 diabetes mellitus with diabetic chronic kidney disease: Secondary | ICD-10-CM | POA: Diagnosis not present

## 2021-02-18 DIAGNOSIS — C7989 Secondary malignant neoplasm of other specified sites: Secondary | ICD-10-CM | POA: Diagnosis not present

## 2021-02-18 DIAGNOSIS — N1832 Chronic kidney disease, stage 3b: Secondary | ICD-10-CM | POA: Diagnosis not present

## 2021-02-18 DIAGNOSIS — C4442 Squamous cell carcinoma of skin of scalp and neck: Secondary | ICD-10-CM | POA: Diagnosis not present

## 2021-02-18 DIAGNOSIS — I129 Hypertensive chronic kidney disease with stage 1 through stage 4 chronic kidney disease, or unspecified chronic kidney disease: Secondary | ICD-10-CM | POA: Diagnosis not present

## 2021-02-18 DIAGNOSIS — E1122 Type 2 diabetes mellitus with diabetic chronic kidney disease: Secondary | ICD-10-CM | POA: Diagnosis not present

## 2021-02-18 DIAGNOSIS — E785 Hyperlipidemia, unspecified: Secondary | ICD-10-CM | POA: Diagnosis not present

## 2021-02-19 DIAGNOSIS — E1122 Type 2 diabetes mellitus with diabetic chronic kidney disease: Secondary | ICD-10-CM | POA: Diagnosis not present

## 2021-02-19 DIAGNOSIS — C4442 Squamous cell carcinoma of skin of scalp and neck: Secondary | ICD-10-CM | POA: Diagnosis not present

## 2021-02-19 DIAGNOSIS — E785 Hyperlipidemia, unspecified: Secondary | ICD-10-CM | POA: Diagnosis not present

## 2021-02-19 DIAGNOSIS — C7989 Secondary malignant neoplasm of other specified sites: Secondary | ICD-10-CM | POA: Diagnosis not present

## 2021-02-19 DIAGNOSIS — I129 Hypertensive chronic kidney disease with stage 1 through stage 4 chronic kidney disease, or unspecified chronic kidney disease: Secondary | ICD-10-CM | POA: Diagnosis not present

## 2021-02-19 DIAGNOSIS — N1832 Chronic kidney disease, stage 3b: Secondary | ICD-10-CM | POA: Diagnosis not present

## 2021-02-20 DIAGNOSIS — C4442 Squamous cell carcinoma of skin of scalp and neck: Secondary | ICD-10-CM | POA: Diagnosis not present

## 2021-02-20 DIAGNOSIS — E785 Hyperlipidemia, unspecified: Secondary | ICD-10-CM | POA: Diagnosis not present

## 2021-02-20 DIAGNOSIS — C7989 Secondary malignant neoplasm of other specified sites: Secondary | ICD-10-CM | POA: Diagnosis not present

## 2021-02-20 DIAGNOSIS — I129 Hypertensive chronic kidney disease with stage 1 through stage 4 chronic kidney disease, or unspecified chronic kidney disease: Secondary | ICD-10-CM | POA: Diagnosis not present

## 2021-02-20 DIAGNOSIS — E1122 Type 2 diabetes mellitus with diabetic chronic kidney disease: Secondary | ICD-10-CM | POA: Diagnosis not present

## 2021-02-20 DIAGNOSIS — N1832 Chronic kidney disease, stage 3b: Secondary | ICD-10-CM | POA: Diagnosis not present

## 2021-02-21 DIAGNOSIS — E785 Hyperlipidemia, unspecified: Secondary | ICD-10-CM | POA: Diagnosis not present

## 2021-02-21 DIAGNOSIS — N1832 Chronic kidney disease, stage 3b: Secondary | ICD-10-CM | POA: Diagnosis not present

## 2021-02-21 DIAGNOSIS — E1122 Type 2 diabetes mellitus with diabetic chronic kidney disease: Secondary | ICD-10-CM | POA: Diagnosis not present

## 2021-02-21 DIAGNOSIS — I129 Hypertensive chronic kidney disease with stage 1 through stage 4 chronic kidney disease, or unspecified chronic kidney disease: Secondary | ICD-10-CM | POA: Diagnosis not present

## 2021-02-21 DIAGNOSIS — C4442 Squamous cell carcinoma of skin of scalp and neck: Secondary | ICD-10-CM | POA: Diagnosis not present

## 2021-02-21 DIAGNOSIS — C7989 Secondary malignant neoplasm of other specified sites: Secondary | ICD-10-CM | POA: Diagnosis not present

## 2021-02-22 DIAGNOSIS — N1832 Chronic kidney disease, stage 3b: Secondary | ICD-10-CM | POA: Diagnosis not present

## 2021-02-22 DIAGNOSIS — E785 Hyperlipidemia, unspecified: Secondary | ICD-10-CM | POA: Diagnosis not present

## 2021-02-22 DIAGNOSIS — I129 Hypertensive chronic kidney disease with stage 1 through stage 4 chronic kidney disease, or unspecified chronic kidney disease: Secondary | ICD-10-CM | POA: Diagnosis not present

## 2021-02-22 DIAGNOSIS — C7989 Secondary malignant neoplasm of other specified sites: Secondary | ICD-10-CM | POA: Diagnosis not present

## 2021-02-22 DIAGNOSIS — E1122 Type 2 diabetes mellitus with diabetic chronic kidney disease: Secondary | ICD-10-CM | POA: Diagnosis not present

## 2021-02-22 DIAGNOSIS — C4442 Squamous cell carcinoma of skin of scalp and neck: Secondary | ICD-10-CM | POA: Diagnosis not present

## 2021-02-23 DIAGNOSIS — C7989 Secondary malignant neoplasm of other specified sites: Secondary | ICD-10-CM | POA: Diagnosis not present

## 2021-02-23 DIAGNOSIS — I129 Hypertensive chronic kidney disease with stage 1 through stage 4 chronic kidney disease, or unspecified chronic kidney disease: Secondary | ICD-10-CM | POA: Diagnosis not present

## 2021-02-23 DIAGNOSIS — E1122 Type 2 diabetes mellitus with diabetic chronic kidney disease: Secondary | ICD-10-CM | POA: Diagnosis not present

## 2021-02-23 DIAGNOSIS — C4442 Squamous cell carcinoma of skin of scalp and neck: Secondary | ICD-10-CM | POA: Diagnosis not present

## 2021-02-23 DIAGNOSIS — E785 Hyperlipidemia, unspecified: Secondary | ICD-10-CM | POA: Diagnosis not present

## 2021-02-23 DIAGNOSIS — N1832 Chronic kidney disease, stage 3b: Secondary | ICD-10-CM | POA: Diagnosis not present

## 2021-02-24 DIAGNOSIS — E1122 Type 2 diabetes mellitus with diabetic chronic kidney disease: Secondary | ICD-10-CM | POA: Diagnosis not present

## 2021-02-24 DIAGNOSIS — I129 Hypertensive chronic kidney disease with stage 1 through stage 4 chronic kidney disease, or unspecified chronic kidney disease: Secondary | ICD-10-CM | POA: Diagnosis not present

## 2021-02-24 DIAGNOSIS — C7989 Secondary malignant neoplasm of other specified sites: Secondary | ICD-10-CM | POA: Diagnosis not present

## 2021-02-24 DIAGNOSIS — E785 Hyperlipidemia, unspecified: Secondary | ICD-10-CM | POA: Diagnosis not present

## 2021-02-24 DIAGNOSIS — C4442 Squamous cell carcinoma of skin of scalp and neck: Secondary | ICD-10-CM | POA: Diagnosis not present

## 2021-02-24 DIAGNOSIS — N1832 Chronic kidney disease, stage 3b: Secondary | ICD-10-CM | POA: Diagnosis not present

## 2021-02-25 DIAGNOSIS — E1122 Type 2 diabetes mellitus with diabetic chronic kidney disease: Secondary | ICD-10-CM | POA: Diagnosis not present

## 2021-02-25 DIAGNOSIS — N1832 Chronic kidney disease, stage 3b: Secondary | ICD-10-CM | POA: Diagnosis not present

## 2021-02-25 DIAGNOSIS — E785 Hyperlipidemia, unspecified: Secondary | ICD-10-CM | POA: Diagnosis not present

## 2021-02-25 DIAGNOSIS — C7989 Secondary malignant neoplasm of other specified sites: Secondary | ICD-10-CM | POA: Diagnosis not present

## 2021-02-25 DIAGNOSIS — I129 Hypertensive chronic kidney disease with stage 1 through stage 4 chronic kidney disease, or unspecified chronic kidney disease: Secondary | ICD-10-CM | POA: Diagnosis not present

## 2021-02-25 DIAGNOSIS — C4442 Squamous cell carcinoma of skin of scalp and neck: Secondary | ICD-10-CM | POA: Diagnosis not present

## 2021-03-15 DEATH — deceased

## 2021-04-08 ENCOUNTER — Encounter: Payer: Self-pay | Admitting: Oncology

## 2021-04-15 ENCOUNTER — Ambulatory Visit: Payer: Medicare Other | Admitting: Podiatry

## 2021-10-31 IMAGING — CT CT CHEST W/ CM
3 of 8 series · 14 of 46 positions shown, 16 images · IV contrast (OMNIPAQUE)
Comparison: CT abdomen pelvis, 01/06/2019, 09/21/2018

CLINICAL DATA: Bladder cancer staging, status post chemotherapy and
radiation therapy

EXAM:
CT CHEST, ABDOMEN, AND PELVIS WITH CONTRAST
TECHNIQUE: Multidetector CT imaging of the chest, abdomen and pelvis was
performed following the standard protocol during bolus
administration of intravenous contrast.
CONTRAST:  100mL OMNIPAQUE IOHEXOL 300 MG/ML SOLN, additional oral
enteric contrast

[Series 2: axial post · axial · 0.76mm/px · z∈[-525,-15]mm · 9 of 128 slices shown, 11 images]
[im 13/128  soft-tissue]
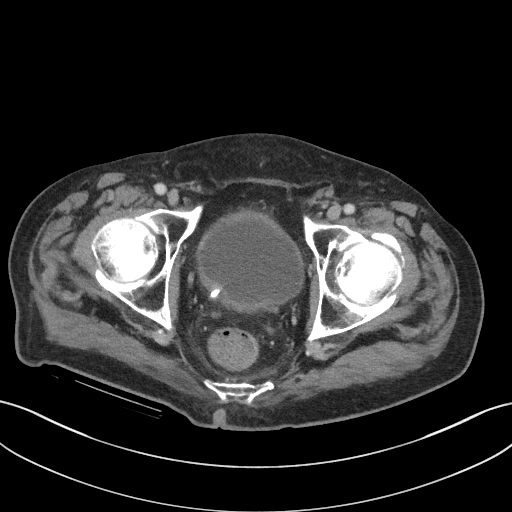
[im 13/128  bone]
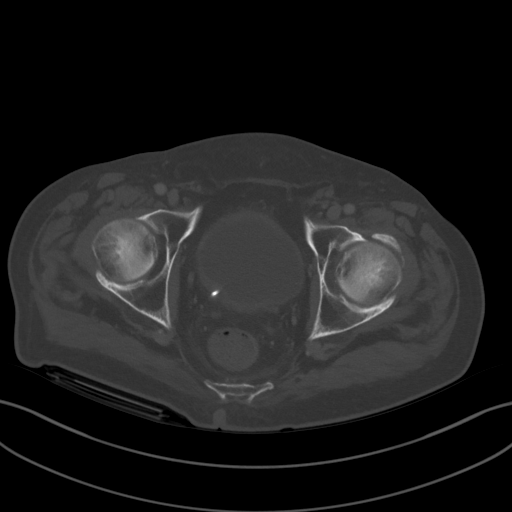
[im 26/128  soft-tissue]
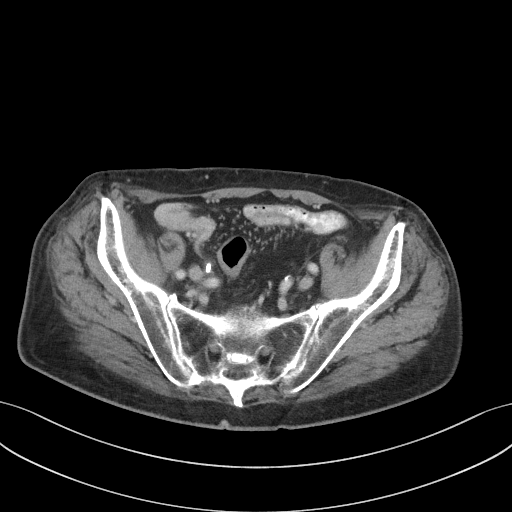
[im 39/128  soft-tissue]
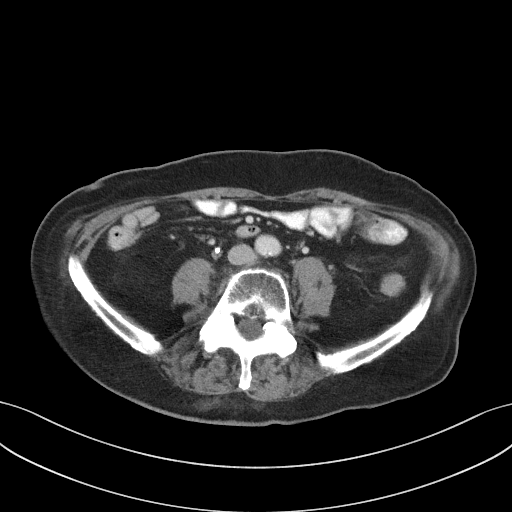
[im 51/128  soft-tissue]
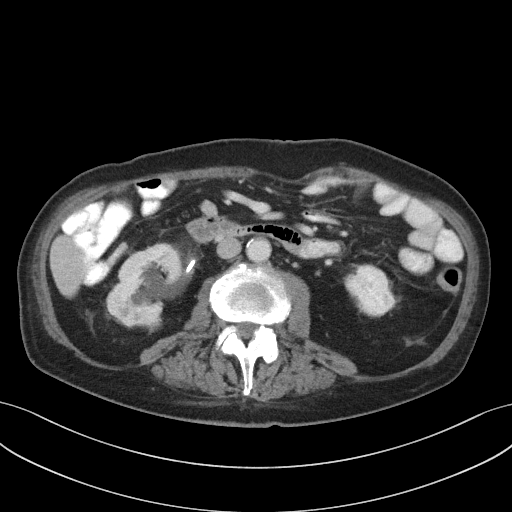
[im 64/128  soft-tissue]
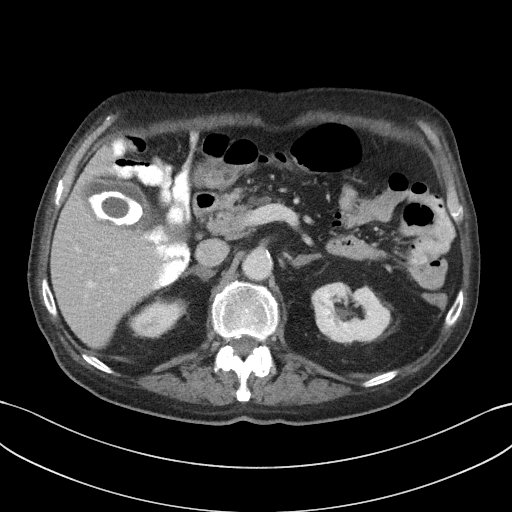
[im 77/128  soft-tissue]
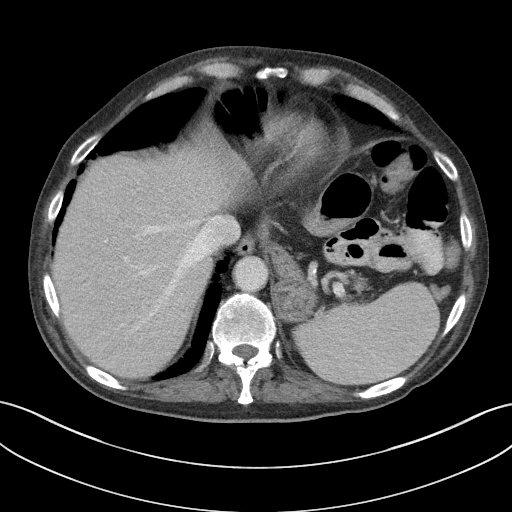
[im 89/128  soft-tissue]
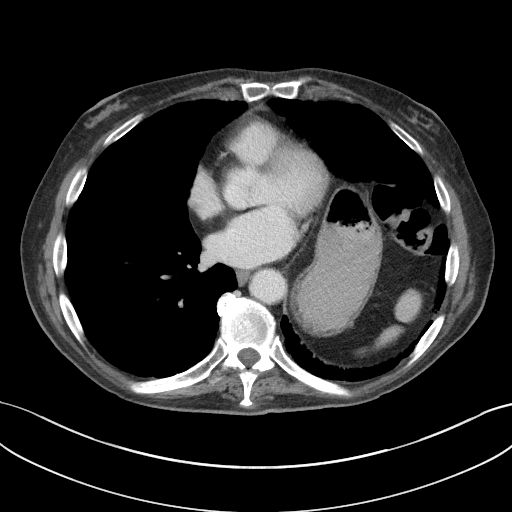
[im 102/128  soft-tissue]
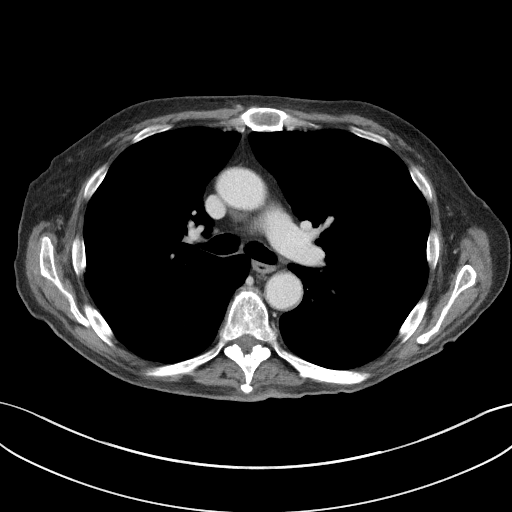
[im 115/128  soft-tissue]
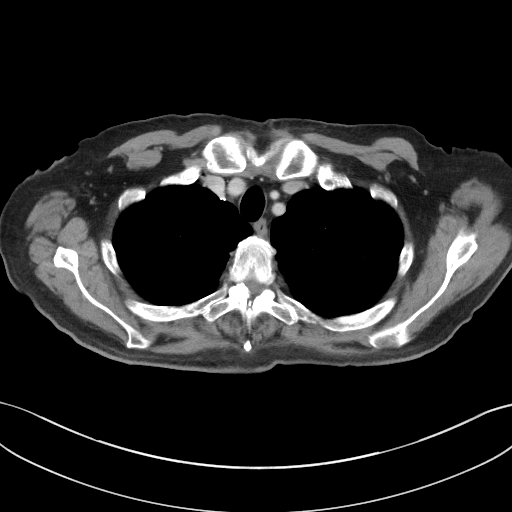
[im 115/128  bone]
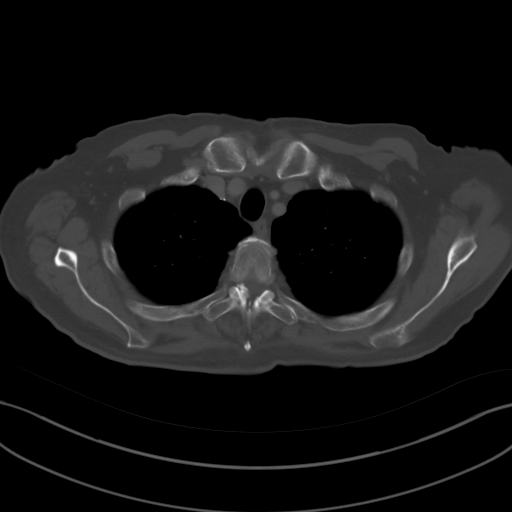

[Series 5: coronal post · coronal · 0.73mm/px · 3 of 109 slices shown]
[im 28/109  soft-tissue]
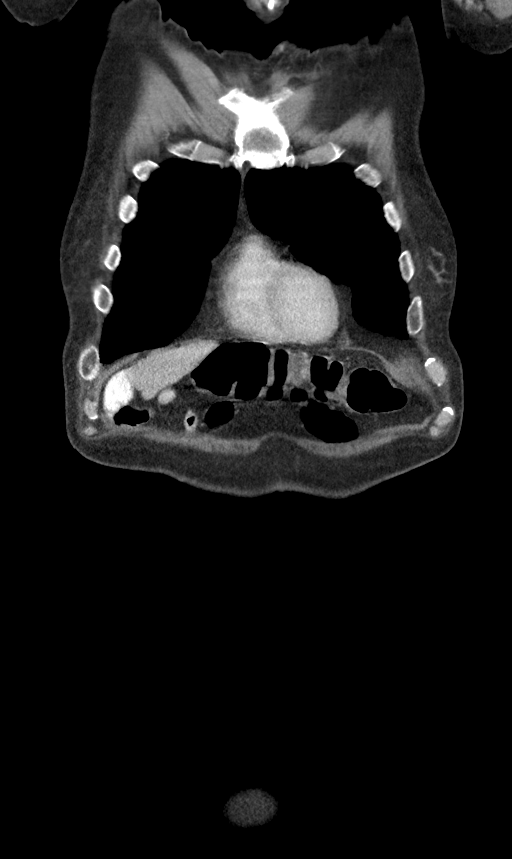
[im 55/109  soft-tissue]
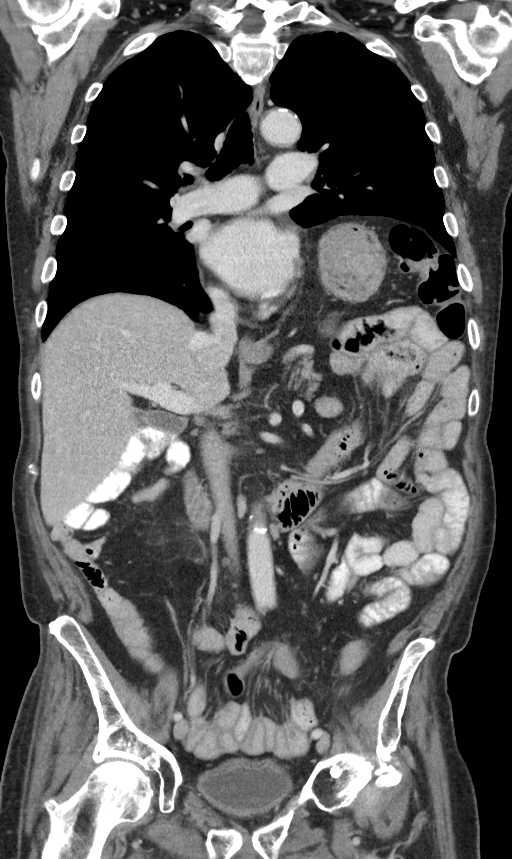
[im 82/109  soft-tissue]
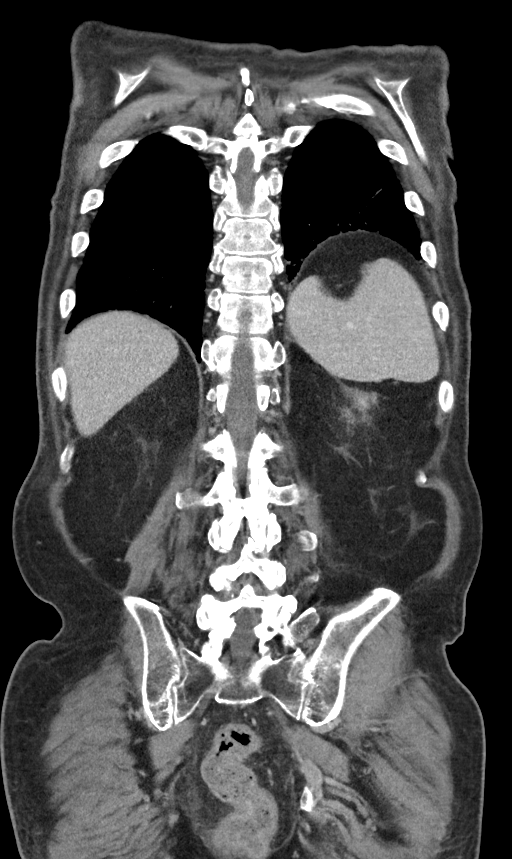

[Series 8: axial delay · axial · delayed · 0.87mm/px · z∈[-553,-483]mm · 2 of 85 slices shown]
[im 15/85  soft-tissue]
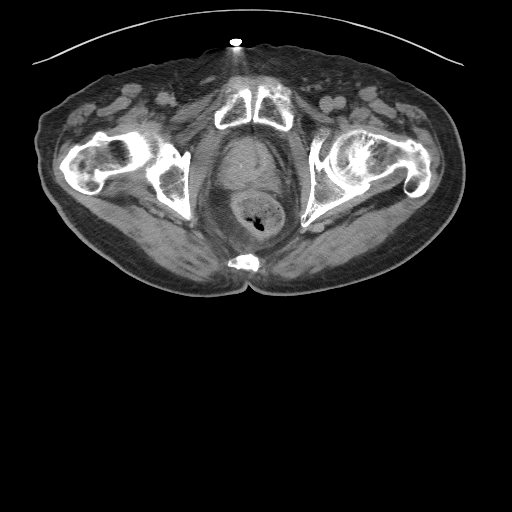
[im 29/85  soft-tissue]
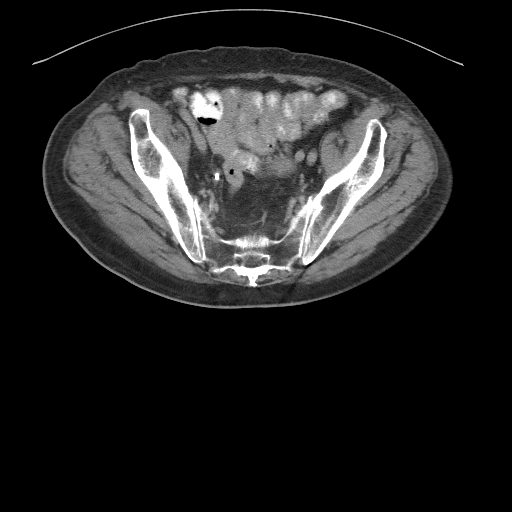

[14 of 46 positions shown; findings below may reference images not displayed]

FINDINGS: CT CHEST FINDINGS

Cardiovascular: Scattered aortic atherosclerosis. Normal heart size.
No pericardial effusion.

Mediastinum/Nodes: No enlarged mediastinal, hilar, or axillary lymph
nodes. Thyroid gland, trachea, and esophagus demonstrate no
significant findings.

Lungs/Pleura: Redemonstrated scarring and volume loss of the left
lower lobe with elevation of the left hemidiaphragm. Unchanged fine
centrilobular nodularity throughout the lungs, most conspicuous in
the upper lobes. No pleural effusion or pneumothorax.

Musculoskeletal: No chest wall mass or suspicious bone lesions
identified.

CT ABDOMEN PELVIS FINDINGS

Hepatobiliary: No solid liver abnormality is seen. No gallstones,
gallbladder wall thickening, or biliary dilatation.

Pancreas: Unremarkable. No pancreatic ductal dilatation or
surrounding inflammatory changes.

Spleen: Normal in size without significant abnormality.

Adrenals/Urinary Tract: Adrenal glands are unremarkable. Right
double-J ureteral stent remains in position with formed pigtails in
the right renal pelvis and urinary bladder. Mild, persistent right
hydronephrosis and mild periureteral stranding. Unchanged mild,
right eccentric thickening of the posterior urinary bladder (series
2, image 117).

Stomach/Bowel: Stomach is within normal limits. No evidence of bowel
wall thickening, distention, or inflammatory changes.

Vascular/Lymphatic: Aortic atherosclerosis. No enlarged abdominal or
pelvic lymph nodes.

Reproductive: No mass or other abnormality.

Other: No abdominal wall hernia or abnormality. No abdominopelvic
ascites.

Musculoskeletal: No acute or significant osseous findings.
IMPRESSION: 1. Unchanged post treatment appearance of the urinary bladder with
mild, right eccentric thickening. No discrete mass noted.
2. Right double-J ureteral stent remains in position with formed
pigtails in the right renal pelvis and urinary bladder. Mild,
persistent right hydronephrosis and mild periureteral stranding.
3. No evidence of lymphadenopathy or distant metastatic disease
within the chest, abdomen or pelvis.
4. Unchanged fine centrilobular nodularity throughout the lungs,
most conspicuous in the upper lobes. This is consistent with
smoking-related respiratory bronchiolitis or other nonspecific
atypical infection or inflammatory inhalational process. Findings
are generally not consistent with metastatic disease.
5. Cholelithiasis.
6.  Aortic Atherosclerosis (32WLE-LH6.6).

## 2021-11-18 ENCOUNTER — Other Ambulatory Visit: Payer: Self-pay | Admitting: Nurse Practitioner

## 2022-09-01 ENCOUNTER — Encounter: Payer: Self-pay | Admitting: Oncology

## 2023-05-06 NOTE — Telephone Encounter (Signed)
Telephone call
